# Patient Record
Sex: Female | Born: 1940 | Race: Black or African American | Hispanic: No | Smoking: Never smoker
Health system: Southern US, Community
[De-identification: ages and names within clinical notes are randomized; demographics above are authoritative.]

## PROBLEM LIST (undated history)

## (undated) DIAGNOSIS — G5600 Carpal tunnel syndrome, unspecified upper limb: Secondary | ICD-10-CM

## (undated) DIAGNOSIS — M2540 Effusion, unspecified joint: Secondary | ICD-10-CM

## (undated) DIAGNOSIS — R0602 Shortness of breath: Secondary | ICD-10-CM

## (undated) DIAGNOSIS — Z8489 Family history of other specified conditions: Secondary | ICD-10-CM

## (undated) DIAGNOSIS — H269 Unspecified cataract: Secondary | ICD-10-CM

## (undated) DIAGNOSIS — Z9889 Other specified postprocedural states: Secondary | ICD-10-CM

## (undated) DIAGNOSIS — Z9289 Personal history of other medical treatment: Secondary | ICD-10-CM

## (undated) DIAGNOSIS — I10 Essential (primary) hypertension: Secondary | ICD-10-CM

## (undated) DIAGNOSIS — R350 Frequency of micturition: Secondary | ICD-10-CM

## (undated) DIAGNOSIS — Q2112 Patent foramen ovale: Secondary | ICD-10-CM

## (undated) DIAGNOSIS — R112 Nausea with vomiting, unspecified: Secondary | ICD-10-CM

## (undated) DIAGNOSIS — M62838 Other muscle spasm: Secondary | ICD-10-CM

## (undated) DIAGNOSIS — N189 Chronic kidney disease, unspecified: Secondary | ICD-10-CM

## (undated) DIAGNOSIS — D649 Anemia, unspecified: Secondary | ICD-10-CM

## (undated) DIAGNOSIS — M1990 Unspecified osteoarthritis, unspecified site: Secondary | ICD-10-CM

## (undated) DIAGNOSIS — G4733 Obstructive sleep apnea (adult) (pediatric): Secondary | ICD-10-CM

## (undated) DIAGNOSIS — Z9989 Dependence on other enabling machines and devices: Secondary | ICD-10-CM

## (undated) DIAGNOSIS — I739 Peripheral vascular disease, unspecified: Secondary | ICD-10-CM

## (undated) DIAGNOSIS — J42 Unspecified chronic bronchitis: Secondary | ICD-10-CM

## (undated) DIAGNOSIS — M2550 Pain in unspecified joint: Secondary | ICD-10-CM

## (undated) DIAGNOSIS — Q211 Atrial septal defect: Secondary | ICD-10-CM

## (undated) DIAGNOSIS — R351 Nocturia: Secondary | ICD-10-CM

## (undated) DIAGNOSIS — J189 Pneumonia, unspecified organism: Secondary | ICD-10-CM

## (undated) HISTORY — PX: CHEST TUBE INSERTION: SHX231

## (undated) HISTORY — PX: BACK SURGERY: SHX140

## (undated) HISTORY — PX: KNEE ARTHROSCOPY: SUR90

## (undated) HISTORY — PX: APPENDECTOMY: SHX54

## (undated) HISTORY — PX: COLONOSCOPY: SHX174

## (undated) HISTORY — PX: ABDOMINAL HYSTERECTOMY: SHX81

## (undated) HISTORY — PX: FRACTURE SURGERY: SHX138

---

## 1968-09-24 DIAGNOSIS — D649 Anemia, unspecified: Secondary | ICD-10-CM

## 1968-09-24 HISTORY — DX: Anemia, unspecified: D64.9

## 1988-09-24 HISTORY — PX: BREAST BIOPSY: SHX20

## 1988-09-24 HISTORY — PX: HARDWARE REMOVAL: SHX979

## 1988-09-24 HISTORY — PX: TIBIA FRACTURE SURGERY: SHX806

## 2002-02-24 HISTORY — PX: HEMILAMINOTOMY LUMBAR SPINE: SUR654

## 2002-03-13 ENCOUNTER — Encounter: Payer: Self-pay | Admitting: Neurosurgery

## 2002-03-15 ENCOUNTER — Encounter: Payer: Self-pay | Admitting: Neurosurgery

## 2002-03-15 ENCOUNTER — Inpatient Hospital Stay (HOSPITAL_COMMUNITY): Admission: RE | Admit: 2002-03-15 | Discharge: 2002-03-16 | Payer: Self-pay | Admitting: Neurosurgery

## 2003-04-30 ENCOUNTER — Ambulatory Visit (HOSPITAL_COMMUNITY): Admission: RE | Admit: 2003-04-30 | Discharge: 2003-04-30 | Payer: Self-pay | Admitting: Neurosurgery

## 2003-05-25 HISTORY — PX: POSTERIOR LUMBAR FUSION: SHX6036

## 2003-05-30 ENCOUNTER — Inpatient Hospital Stay (HOSPITAL_COMMUNITY): Admission: RE | Admit: 2003-05-30 | Discharge: 2003-06-04 | Payer: Self-pay | Admitting: Neurosurgery

## 2003-12-23 ENCOUNTER — Encounter: Admission: RE | Admit: 2003-12-23 | Discharge: 2003-12-23 | Payer: Self-pay | Admitting: Neurosurgery

## 2007-09-19 ENCOUNTER — Ambulatory Visit: Payer: Self-pay | Admitting: Cardiology

## 2007-09-26 ENCOUNTER — Ambulatory Visit: Payer: Self-pay | Admitting: Cardiology

## 2008-07-14 ENCOUNTER — Ambulatory Visit: Payer: Self-pay | Admitting: Cardiology

## 2009-03-20 ENCOUNTER — Ambulatory Visit: Payer: Self-pay | Admitting: Cardiology

## 2009-05-11 ENCOUNTER — Ambulatory Visit: Payer: Self-pay | Admitting: Cardiology

## 2010-02-24 HISTORY — PX: LUMBAR LAMINECTOMY/DECOMPRESSION MICRODISCECTOMY: SHX5026

## 2010-03-16 ENCOUNTER — Inpatient Hospital Stay (HOSPITAL_COMMUNITY): Payer: Medicare Other

## 2010-03-16 ENCOUNTER — Inpatient Hospital Stay (HOSPITAL_COMMUNITY)
Admission: RE | Admit: 2010-03-16 | Discharge: 2010-03-18 | DRG: 491 | Disposition: A | Discharge status: Home / Self Care | Payer: Medicare Other | Source: Ambulatory Visit | Attending: Neurosurgery | Admitting: Neurosurgery

## 2010-03-16 DIAGNOSIS — K219 Gastro-esophageal reflux disease without esophagitis: Secondary | ICD-10-CM | POA: Diagnosis present

## 2010-03-16 DIAGNOSIS — I10 Essential (primary) hypertension: Secondary | ICD-10-CM | POA: Diagnosis present

## 2010-03-16 DIAGNOSIS — G4733 Obstructive sleep apnea (adult) (pediatric): Secondary | ICD-10-CM | POA: Diagnosis present

## 2010-03-16 DIAGNOSIS — Z981 Arthrodesis status: Secondary | ICD-10-CM

## 2010-03-16 DIAGNOSIS — M5126 Other intervertebral disc displacement, lumbar region: Principal | ICD-10-CM | POA: Diagnosis present

## 2010-03-16 DIAGNOSIS — E669 Obesity, unspecified: Secondary | ICD-10-CM | POA: Diagnosis present

## 2010-03-16 LAB — SURGICAL PCR SCREEN
MRSA, PCR: NEGATIVE
Staphylococcus aureus: NEGATIVE

## 2010-03-16 LAB — BASIC METABOLIC PANEL
BUN: 21 mg/dL (ref 6–23)
CO2: 31 mEq/L (ref 19–32)
Calcium: 9.5 mg/dL (ref 8.4–10.5)
Chloride: 106 mEq/L (ref 96–112)
Glucose, Bld: 97 mg/dL (ref 70–99)

## 2010-03-16 LAB — CBC
Hemoglobin: 11.6 g/dL — ABNORMAL LOW (ref 12.0–15.0)
MCH: 30.9 pg (ref 26.0–34.0)
MCV: 90.1 fL (ref 78.0–100.0)
Platelets: 244 10*3/uL (ref 150–400)
RBC: 3.75 MIL/uL — ABNORMAL LOW (ref 3.87–5.11)
WBC: 11.7 10*3/uL — ABNORMAL HIGH (ref 4.0–10.5)

## 2010-04-06 NOTE — Op Note (Signed)
  NAMEEBONYE, READE NO.:  [id number]  MEDICAL RECORD NO.:  [id number]           PATIENT TYPE:  I  LOCATION:  3011                         FACILITY:  [facility]  PHYSICIAN:  Hilda Lias, M.D.   DATE OF BIRTH:  1940/12/22  DATE OF PROCEDURE:  03/16/2010 DATE OF DISCHARGE:                              OPERATIVE REPORT   PREOPERATIVE DIAGNOSIS:  Left L2-L3 herniated disk with a fragment status post 4-5 and 5-1 fusion.  POSTOPERATIVE DIAGNOSES:  Left L2-L3 herniated disk with a fragment status post 4-5 and 5-1 fusion.  PROCEDURE:  Left L2-L3 laminotomy, lysis of adhesion, decompression of the thecal sac, removal of calcified herniated disk at the level of L2-3 left.  Microscope.  SURGICAL PROCEDURES:  Hilda Lias, MD  ASSISTANT:  Coletta Memos, MD  CLINICAL HISTORY:  Ms. Tilmon is a 70 year old female who in the past had fusion of 4-5 and 5-1.  She came to my office yesterday complaining of back pain down to the left leg which had been going on for 5-6 weeks. She is nonweightbearing.  MRI showed a large herniated disk with a fragment at the level of 2-3.  Surgery was advised.  The risks were explained to her.  PROCEDURE:  The patient was taken to the OR, and after intubation she was positioned in prone manner.  The back was cleaned with DuraPrep. Drapes were applied.  X-rays showed that we were right at the level of L2-L3.  Incision was made.  Immediately underneath the subcutaneous tissue, we found quite a bit of scar tissue.  Dissection was carried down until we found the spinous process.  The canal was quite vertical. We took a series of x-rays just to be sure that we were at the right level.  Then with the microscope and with the help of the drill, we removed part of the lamina of L2-L3 and part of the facet of 2-3.  The patient has a lot of adhesion.  Part of the disk was calcified up to the point to enter into the fragment we had to drill the  fragment.  At the end after we accomplished lysis, we went into the disk space.  We entered the disk space and diskectomy medial and lateral was achieved. We came back again to the foramen, and the L3 nerve root was decompressed.  The L2 was freed.  Again more lysis of adhesion was achieved.  Valsalva maneuver was negative.  From then on, the wound was closed with Vicryl and Steri-Strips.          ______________________________ Hilda Lias, M.D.     EB/MEDQ  D:  03/16/2010  T:  03/17/2010  Job:  [id number]  Electronically Signed by Hilda Lias M.D. on 04/06/2010 05:46:11 PM

## 2010-06-08 NOTE — Assessment & Plan Note (Signed)
[facility]                          [facility] OFFICE NOTE   Diane Daugherty, Diane Daugherty                        MRN:          [id number]  DATE:09/19/2007                            DOB:          [birth year]    REFERRING PHYSICIAN:  Wyvonnia Lora   REASON FOR CONSULTATION:  Chest pain.   HISTORY OF PRESENT ILLNESS:  Diane Daugherty is a very pleasant 70 year old  woman with a history of hypertension.  She describes a history of  intermittent chest pain beginning approximately 3 months ago.  She saw  Dr. Margo Common and had a chest x-ray done back in late June which was  essentially normal.  She describes 2 different types of chest pain, one  being a dull ache mainly in the left upper chest, somewhat in the left  upper back, sporadic, sometimes lasting up to several hours, and not  purely exertional.  She also had more of a tenderness in her left  lateral chest wall.  Both of these have now completely resolved.  At  that time, she was experiencing these symptoms, she denies having any  cough, hemoptysis, fevers, or chills.  She was very concerned about her  heart and decided to start doing a regular walking regimen.  She is now  up to 2 miles at a time, although she is limited by previous history of  back problems and surgery as well as a bad left knee.  She has NYHA  class II dyspnea on exertion.  Her screening electrocardiogram today  shows sinus rhythm with poor R-wave progression in the anterior  precordium, specifically V1-V3.  There is no old tracing for comparison.  She does report having a stress test (treadmill) several years ago prior  back surgery and calls this being normal.  She has not had any risk  stratification since that time.   ALLERGIES:  No known drug allergies.   MEDICATIONS:  1. Benazepril and hydrochlorothiazide 20/25 mg p.o. daily.  2. Sulindac 200 mg p.o. b.i.d.  3. Premarin 0.3 mg p.o. daily.  4. Cyclobenzaprine 10 mg p.o. p.r.n.  5.  Calcium with vitamin D 600 mg p.o. b.i.d.  6. Multivitamin 1 p.o. daily.  7. Omega 3 supplements 1000 mg p.o. b.i.d.   PAST MEDICAL HISTORY:  As outlined above.  She has a history of prior  hysterectomy, 3 cesarean sections, prior fractured right leg with  subsequent screw fixation, removal of a lipoma, back surgeries by Dr.  Jeral Fruit back in 2004 and 2005.  No reported problems of diabetes mellitus  or hyperlipidemia.   SOCIAL HISTORY:  The patient is divorced.  She has 3 children.  She is  disabled following her back surgery.  She denies any tobacco use.  No  alcohol use.   FAMILY HISTORY:  Reviewed and is noncontributory for premature  cardiovascular disease.  She states that her father died in his 83s with  cardiovascular disease and hypertension.  Her mother died at age 50 in  an accident.  No siblings with cardiovascular disease.   REVIEW OF SYSTEMS:  As detailed above.  She does have seasonal  allergies.  Problems with arthritic pain.  No palpitations, orthopnea,  PND, or syncope.   PHYSICAL EXAMINATION:  VITAL SIGNS:  Weight is 224 pounds, heart rate  82, and blood pressure 154/80.  The patient is overweight in no acute  distress.  HEENT:  Conjunctiva is normal.  Pharynx is clear.  NECK:  Supple.  No elevated jugular venous pressure.  No audible bruits.  No thyromegaly is noted.  LUNGS:  Clear to auscultation throughout.  CARDIAC EXAM:  Reveals a regular rate and rhythm, soft S3.  No  pericardial rub or S3 gallop.  ABDOMEN:  Obese, unable to palpate liver edge.  No bruits.  Bowel sounds  present.  EXTREMITIES:  Exhibit mild pitting edema bilaterally, somewhat worse on  the left.  Distal pulses are 1+.  SKIN:  Warm and dry.  MUSCULOSKELETAL:  No kyphosis is noted.  NEUROPSYCHIATRIC:  The patient is alert and oriented x3.  Affect is  appropriate.   IMPRESSION AND RECOMMENDATIONS:  Transient episodes of chest pain as  outlined above in a 70 year old overweight woman with  hypertension.  Her  resting electrocardiogram does show poor anterior R-wave progression in  leads V1 to V3.  She describes chest pain features that are fairly  atypical overall.  She remains concerned about the status of her heart.  I reviewed the matter with her and discussed either simply observing her  for any recurrent symptoms versus proceeding to an exercise  echocardiogram for additional risk stratification.  She would be more  reassured with testing and therefore we will arrange this in the near  future.  If her studies are low-risk, I would not anticipate any further  cardiac evaluation unless she continues to manifest progressive  symptomatology.  Otherwise, we will plan to see her back and review the  options.     Jonelle Sidle, MD  Electronically Signed    SGM/MedQ  DD: 09/19/2007  DT: 09/20/2007  Job #: [id number]   cc:   Wyvonnia Lora

## 2010-06-11 NOTE — H&P (Signed)
NAME:  Diane Daugherty, Diane Daugherty                           ACCOUNT NO.:  [id number]   MEDICAL RECORD NO.:  [id number]                   PATIENT TYPE:  INP   LOCATION:  3172                                 FACILITY:  [facility]   PHYSICIAN:  Hilda Lias, M.D.                DATE OF BIRTH:  1940/02/13   DATE OF ADMISSION:  05/30/2003  DATE OF DISCHARGE:                                HISTORY & PHYSICAL   Ms. Braud is a lady who has been seen in my office on several occasions  because of back pain.  This lady, back in 2004, underwent removal of a  synovial cyst.  At that time, we found she had spondylolisthesis at the L4-  L5.  The patient has had a history of many years of back pain and did say it  was better after surgery.  Nevertheless, she came back complaining of pain  in the opposite leg associated with lower back pain.  She had conservative  treatment, and she is not any better.  She had been quite miserable.  We  scheduled her for surgery before, but there was a problem with her cell  count.  We repeated the x-ray and it showed that she has a case of severe  stenosis at the level of L4-L5, associated with a spondylolisthesis and  degenerative changes at the level of L5-S1 .  Because of that, she wants to  proceed with surgery.   PAST MEDICAL HISTORY:  1. Removal of a synovial cyst.  2. She has had a hysterectomy.  3. She has a history of fractured right leg.  4. Pneumothorax .   She is not allergic to any medications.   SOCIAL HISTORY:  Negative.   REVIEW OF SYSTEMS:  Positive for high blood pressure, back pain, leg pain.   PHYSICAL EXAMINATION:  HEENT:  Normal.  NECK:  She is able to flex, extend, lateral with no problem.  LUNGS:  Clear.  HEART:  Sounds normal.  ABDOMEN:  Normal.  EXTREMITIES:  Normal pulses.  NEURO:  Mental status and cranial nerves normal.  Strength:  She has  weakness on dorsiflexion of both feet, especially the left one.  She had  difficulty walking on  tip-toes.  She has decrease of flexibility in the  lumbar spine.   The lumbar spine x-ray shows a spondylolisthesis at the level L4-L5.  The  MRI showed that she has a spondylolisthesis of the level L4-L5 with stenosis  and changed at the L5-S1.   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   RECOMMENDATIONS:  The patient is being admitted for surgery.  The procedure  will be a L4-L5 diskectomy, interbody fusion, posterior autograft fusion.  __________  of L5-S1 with __________  .  The risks were explained to the  patient including the possibility of no improvement whatsoever, need for  further surgery, CSF leak, infection, failure of the hardware, damage to the  vessels of the abdomen.                                                Hilda Lias, M.D.    EB/MEDQ  D:  05/30/2003  T:  05/30/2003  Job:  [id number]

## 2010-06-11 NOTE — Discharge Summary (Signed)
NAME:  Diane Daugherty, Diane Daugherty                           ACCOUNT NO.:  [id number]   MEDICAL RECORD NO.:  [id number]                   PATIENT TYPE:  INP   LOCATION:  3029                                 FACILITY:  [facility]   PHYSICIAN:  Hilda Lias, M.D.                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   MEDICAL HISTORY:  The patient was admitted because of back pain radiating to  both legs.  X-ray showed degenerative disk disease at L4-5, L5-S1 with  spondylolisthesis at L4-5.  The patient was taken to surgery.  Laboratory at  the present time normal, but she has a problem hemoglobin right after  surgery.   HOSPITAL COURSE:  The patient was taken and L4-5, L5-S1 diskectomy  __________ was done.  Today the patient is ambulating, having minimal  discomfort so she is ready to go home.   CONDITION ON DISCHARGE:  Improved.   DISCHARGE MEDICATIONS:  Percocet and Flexeril.   DIET:  Regular.   ACTIVITY:  She is not to drive until I see her.   FOLLOW UP:  With me in four weeks.                                                Hilda Lias, M.D.    EB/MEDQ  D:  06/04/2003  T:  06/04/2003  Job:  [id number]

## 2010-06-11 NOTE — H&P (Signed)
NAME:  Diane Daugherty, Diane Daugherty NO.:  [id number]   MEDICAL RECORD NO.:  [id number]                   PATIENT TYPE:  INP   LOCATION:  NA                                   FACILITY:  [facility]   PHYSICIAN:  Hilda Lias, M.D.                DATE OF BIRTH:  [birth year]   DATE OF ADMISSION:  DATE OF DISCHARGE:                                HISTORY & PHYSICAL   HISTORY OF PRESENT ILLNESS:  The patient is a lady who was seen by me in my  office a few days ago because of back pain for many years.  Since December  2003, the pain is different.  It is localized to the right leg, all the way  down to the right foot up to the point that she is quite miserable, and when  she came to see me she was limping from the right leg.  She felt that it is  getting worse.  She denies any problem with the left leg.  The patient had  been seen by a neurologist and because of findings she was sent to [place] for  further evaluation.   PAST MEDICAL HISTORY:  Hysterectomy.  She had a pneumothorax.  She has a  fractured right leg.   ALLERGIES:  She is not allergic to any medication.   SOCIAL HISTORY:  Negative.   REVIEW OF SYSTEMS:  Positive for high blood pressure, arthritis, back pain,  and leg pain.  The patient initially presented to my office limping on the  right leg.   PHYSICAL EXAMINATION:  VITAL SIGNS:  Weight 205, height 5 feet 1 inch.  Vitals were normal.  HEENT:  Normal.  NECK:  Normal.  LUNGS:  Clear.  HEART:  Heart sounds normal.  ABDOMEN:  Normal.  EXTREMITIES:  Normal pulses.  NEUROLOGIC:  No deficits - normal.  Cranial nerves normal. Strength 5/5  except in the right foot where she has 2/5 weakness on dorsi- and plantar  flexion.  Left leg is normal.  Sensory:  She complains of numbness in the  right side which involves the L5 and S1 nerve roots.  Straight leg raising  is highly positive on the right side at 45 degrees.   LABORATORY DATA:  The MRI showed that she has  a large synovial cyst at the  level 5-1 which is displacing the S1 nerve root.  There is some degenerative  disk disease with stenosis at the level 4-5 and some spondylolisthesis.   IMPRESSION AN PLAN:  L5-S1 synovial cyst with S1 radiculopathy.  Lumbar  stenosis with degenerative spondylolisthesis just at the level of L4-5.  In  addition, we did a lumbar x-ray with flexion and extension which showed that  the degenerative spondylolisthesis at this level.  Because of that the  patient wants to go ahead with surgery, and the procedure will be a right L5  hemilaminectomy with removal of the synovial cyst and foraminotomy to  decompress the L5 and S1 nerve root.  The risks were fully explained which  included infection, CSF leak, worsening of pain, paralysis, need for further  surgery which might require fusion.                                               Hilda Lias, M.D.   EB/MEDQ  D:  03/15/2002  T:  03/15/2002  Job:  [id number]

## 2010-06-11 NOTE — Op Note (Signed)
NAME:  Diane Daugherty, Diane Daugherty                           ACCOUNT NO.:  [id number]   MEDICAL RECORD NO.:  [id number]                   PATIENT TYPE:  INP   LOCATION:  NA                                   FACILITY:  [facility]   PHYSICIAN:  Hilda Lias, M.D.                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  Stenosis 4-5 with stable  spondylolisthesis L4-5.   POSTOPERATIVE DIAGNOSES:  Right L5-S1 synovial cyst with compromise of the  L5 and S1 nerve root and almost complete foot drop.  Stenosis 4-5 with  stable spondylolisthesis L4-5.   OPERATION PERFORMED:  Right L5 hemilaminectomy, removal of synovial cyst,  decompression of the L5 and S1 nerve root.  Microscope.   SURGEON:  Hilda Lias, M.D.   ANESTHESIA:  General.   INDICATIONS FOR PROCEDURE:  The patient was admitted because of back and  right leg pain which has gotten worse since December.  The patient had  previous MRI which showed some degenerative disk disease.  The new MRI  showed synovial cyst at the level of 4-5 with compromise of the L4-S1 nerve  root.  She has stable spondylolisthesis at 4-5.  Surgery was advised.   DESCRIPTION OF PROCEDURE:  The patient was taken to the operating room and  she was positioned in a prone manner.  This lady was severe obese and almost  over 280 pounds.  X-ray was taken prior to proceeding with the surgery and  indeed, the needle was at the level of 5-1.  Incision through the skin was  made down to the subcutaneous tissue, fascia until we found the area of 5-1.  Muscle was retracted on the right side.  Because of the depth, we brought  the microscope immediately.  We identified the L5-S1 space.  With a drill,  we drilled the one third medial facet as well as we did  hemilaminectomy at  L5.  We started working first the level of S-1.  At S1 and L5 there was a  large cyst pushing at the thecal sac as well as the S1 and the L5 nerve  roots outward because of compromise at the level of the axilla.  Dissection  was carried out and then we were able to remove large amounts of  degenerative cyst with the yellow ligament.  We did foraminotomy to  decompress the L5 and S1 nerve root.  At the end, we had good decompression.  The L5-S1 disk was bulging and there was no need to do a diskectomy.  Then  Valsalva maneuver was negative.  The area was irrigated, fat was left in the  dural space.  The wound was  closed with Vicryl and Steri-Strips.                                               Hilda Lias, M.D.    EB/MEDQ  D:  03/15/2002  T:  03/15/2002  Job:  [id number]

## 2010-06-11 NOTE — Op Note (Signed)
NAME:  Diane Daugherty, Diane Daugherty                           ACCOUNT NO.:  [id number]   MEDICAL RECORD NO.:  [id number]                   PATIENT TYPE:  INP   LOCATION:  3029                                 FACILITY:  [facility]   PHYSICIAN:  Hilda Lias, M.D.                DATE OF BIRTH:  02-22-1940   DATE OF PROCEDURE:  05/30/2003  DATE OF DISCHARGE:                                 OPERATIVE REPORT   PREOPERATIVE DIAGNOSIS:  L4-5 stenosis with spondylolisthesis, degenerative  disk disease L5-S1.   POSTOPERATIVE DIAGNOSIS:  L4-5 stenosis with spondylolisthesis, degenerative  disk disease L5-S1.   PROCEDURE:  L3, L4, L5 laminectomy; L4-5, L5-S1 diskectomy; interbody fusion  with allograft and autograft; decompression of the thecal sac; foraminotomy  to decompress L4, L5, S1 nerve roots; posterolateral arthrodesis, L4 to S1,  with autograft and allograft; pedicle screws L4, L5, S1, with crosslink;  bone marrow aspiration.   SURGEON:  Hilda Lias, M.D.   ASSISTANT:  Payton Doughty, M.D.   CLINICAL HISTORY:  The patient was admitted because of back pain with  radiation down to both legs.  In the past she underwent surgery because of  synovial cyst in the left L5-S1.  X-ray shows that she has unstable  spondylolisthesis with a stenosis at the level of L4-5 and with degenerative  disk disease at the level of L5-S1, and she had failure of conservative  treatment.  Surgery was advised and the risks were explained in the history  and physical.   PROCEDURE:  The patient was positioned in a prone manner.  The back was  prepped with Betadine.  A midline incision resecting the previous one was  made.  Incision was carried down through a thick layer of adipose tissue all  the way down until we visualized the spine.  Retraction was made.  We had to  take at least two x-rays just to be sure of the area.  From then on we  removed the spinous process of L3, L4, L5, and bilateral laminectomy was  done.  We  entered the disk space at the level of L4-5 first at the right  side and later on in the left side, and total gross diskectomy was done.  The end plates were removed using a curette.  The same procedure was done at  the level of L5-S1.  In the right side we find quite a bit of adhesion  compromising both L5-S1 nerve roots in the right side.  Neurolysis was  accomplished with decompression of both the nerve roots.  The same procedure  was done in the left side.  Having done this, we entered the disk space and  again the end plates were removed using the curette.  All of this was done  with the help of the C-arm.  Then we introduced at the level of L4-5 an  allograft of 10 x 24 and  at the level of L5-S1 8 x 24.  Medially and  laterally a mixture of autograft and allograft was used.  Then we went  laterally and we removed the periosteum of L4-5 and the ala of the sacrum.  Then a mix of autograft and allograft was used.  Prior to that, when we  introduced the pedicle screw we used a bone marrow needle to aspirate bone  marrow from L4 and L5.  This bone marrow was used to glue together the  autograft and allograft.  With the C-arm we identified the pedicles of L4,  L5, and S1.  Pedicle screw followed by a tap and a screw was accomplished  first in the right side and then in the left side.  The screw size was 6.5 x  40.  AP and lateral had good position of the bone graft.  The pedicle  screws were also in good position in anterior-posterior and lateral x-ray.  Then rod from L4 to S1 was made bilateral.  A crosslink was used right to  left to give more stability to the area.  Having done this, the area was  irrigated and the wound was closed with Vicryl and a Steri-Strip.                                               Hilda Lias, M.D.    EB/MEDQ  D:  05/30/2003  T:  05/31/2003  Job:  [id number]

## 2011-07-18 ENCOUNTER — Ambulatory Visit: Payer: Medicare Other | Attending: Orthopedic Surgery | Admitting: Physical Therapy

## 2011-07-18 DIAGNOSIS — M25569 Pain in unspecified knee: Secondary | ICD-10-CM | POA: Insufficient documentation

## 2011-07-18 DIAGNOSIS — M25669 Stiffness of unspecified knee, not elsewhere classified: Secondary | ICD-10-CM | POA: Insufficient documentation

## 2011-07-18 DIAGNOSIS — R5381 Other malaise: Secondary | ICD-10-CM | POA: Insufficient documentation

## 2011-07-18 DIAGNOSIS — IMO0001 Reserved for inherently not codable concepts without codable children: Secondary | ICD-10-CM | POA: Insufficient documentation

## 2011-07-20 ENCOUNTER — Ambulatory Visit: Payer: Medicare Other | Admitting: Physical Therapy

## 2011-07-25 ENCOUNTER — Ambulatory Visit: Payer: Medicare Other | Attending: Orthopedic Surgery | Admitting: Physical Therapy

## 2011-07-25 DIAGNOSIS — M25669 Stiffness of unspecified knee, not elsewhere classified: Secondary | ICD-10-CM | POA: Insufficient documentation

## 2011-07-25 DIAGNOSIS — R5381 Other malaise: Secondary | ICD-10-CM | POA: Insufficient documentation

## 2011-07-25 DIAGNOSIS — IMO0001 Reserved for inherently not codable concepts without codable children: Secondary | ICD-10-CM | POA: Insufficient documentation

## 2011-07-25 DIAGNOSIS — M25569 Pain in unspecified knee: Secondary | ICD-10-CM | POA: Insufficient documentation

## 2011-07-27 ENCOUNTER — Ambulatory Visit: Payer: Medicare Other | Admitting: Physical Therapy

## 2011-08-01 ENCOUNTER — Ambulatory Visit: Payer: Medicare Other | Admitting: Physical Therapy

## 2011-08-03 ENCOUNTER — Ambulatory Visit: Payer: Medicare Other | Admitting: Physical Therapy

## 2011-08-08 ENCOUNTER — Ambulatory Visit: Payer: Medicare Other | Admitting: Physical Therapy

## 2011-08-10 ENCOUNTER — Ambulatory Visit: Payer: Medicare Other | Admitting: Physical Therapy

## 2011-08-15 ENCOUNTER — Ambulatory Visit: Payer: Medicare Other | Admitting: Physical Therapy

## 2011-08-17 ENCOUNTER — Ambulatory Visit: Payer: Medicare Other | Admitting: Physical Therapy

## 2011-08-22 ENCOUNTER — Ambulatory Visit: Payer: Medicare Other | Admitting: Physical Therapy

## 2011-08-24 ENCOUNTER — Ambulatory Visit: Payer: Medicare Other | Admitting: Physical Therapy

## 2012-08-15 ENCOUNTER — Encounter (HOSPITAL_COMMUNITY): Payer: Self-pay | Admitting: Pharmacist

## 2012-08-22 ENCOUNTER — Encounter (HOSPITAL_COMMUNITY)
Admission: RE | Admit: 2012-08-22 | Discharge: 2012-08-22 | Disposition: A | Discharge status: Home / Self Care | Payer: Medicare Other | Source: Ambulatory Visit | Attending: Orthopaedic Surgery | Admitting: Orthopaedic Surgery

## 2012-08-22 ENCOUNTER — Encounter (HOSPITAL_COMMUNITY): Payer: Self-pay

## 2012-08-22 ENCOUNTER — Ambulatory Visit (HOSPITAL_COMMUNITY)
Admission: RE | Admit: 2012-08-22 | Discharge: 2012-08-22 | Disposition: A | Discharge status: Home / Self Care | Payer: Medicare Other | Source: Ambulatory Visit | Attending: Orthopedic Surgery | Admitting: Orthopedic Surgery

## 2012-08-22 DIAGNOSIS — G4733 Obstructive sleep apnea (adult) (pediatric): Secondary | ICD-10-CM | POA: Insufficient documentation

## 2012-08-22 DIAGNOSIS — M129 Arthropathy, unspecified: Secondary | ICD-10-CM | POA: Insufficient documentation

## 2012-08-22 DIAGNOSIS — R0609 Other forms of dyspnea: Secondary | ICD-10-CM | POA: Insufficient documentation

## 2012-08-22 DIAGNOSIS — Z01810 Encounter for preprocedural cardiovascular examination: Secondary | ICD-10-CM | POA: Insufficient documentation

## 2012-08-22 DIAGNOSIS — I739 Peripheral vascular disease, unspecified: Secondary | ICD-10-CM | POA: Insufficient documentation

## 2012-08-22 DIAGNOSIS — I10 Essential (primary) hypertension: Secondary | ICD-10-CM | POA: Insufficient documentation

## 2012-08-22 DIAGNOSIS — R0989 Other specified symptoms and signs involving the circulatory and respiratory systems: Secondary | ICD-10-CM | POA: Insufficient documentation

## 2012-08-22 DIAGNOSIS — Z01812 Encounter for preprocedural laboratory examination: Secondary | ICD-10-CM | POA: Insufficient documentation

## 2012-08-22 DIAGNOSIS — Z01818 Encounter for other preprocedural examination: Secondary | ICD-10-CM | POA: Insufficient documentation

## 2012-08-22 DIAGNOSIS — Q2111 Secundum atrial septal defect: Secondary | ICD-10-CM | POA: Insufficient documentation

## 2012-08-22 DIAGNOSIS — Q211 Atrial septal defect: Secondary | ICD-10-CM | POA: Insufficient documentation

## 2012-08-22 HISTORY — DX: Patent foramen ovale: Q21.12

## 2012-08-22 HISTORY — DX: Peripheral vascular disease, unspecified: I73.9

## 2012-08-22 HISTORY — DX: Essential (primary) hypertension: I10

## 2012-08-22 HISTORY — DX: Pneumonia, unspecified organism: J18.9

## 2012-08-22 HISTORY — DX: Shortness of breath: R06.02

## 2012-08-22 HISTORY — DX: Effusion, unspecified joint: M25.40

## 2012-08-22 HISTORY — DX: Family history of other specified conditions: Z84.89

## 2012-08-22 HISTORY — DX: Unspecified osteoarthritis, unspecified site: M19.90

## 2012-08-22 HISTORY — DX: Nocturia: R35.1

## 2012-08-22 HISTORY — DX: Frequency of micturition: R35.0

## 2012-08-22 HISTORY — DX: Other specified postprocedural states: R11.2

## 2012-08-22 HISTORY — DX: Unspecified cataract: H26.9

## 2012-08-22 HISTORY — DX: Carpal tunnel syndrome, unspecified upper limb: G56.00

## 2012-08-22 HISTORY — DX: Other specified postprocedural states: Z98.890

## 2012-08-22 HISTORY — DX: Pain in unspecified joint: M25.50

## 2012-08-22 HISTORY — DX: Atrial septal defect: Q21.1

## 2012-08-22 HISTORY — DX: Nausea with vomiting, unspecified: R11.2

## 2012-08-22 HISTORY — DX: Personal history of other medical treatment: Z92.89

## 2012-08-22 HISTORY — DX: Other muscle spasm: M62.838

## 2012-08-22 LAB — PROTIME-INR: INR: 1.07 (ref 0.00–1.49)

## 2012-08-22 LAB — COMPREHENSIVE METABOLIC PANEL
AST: 14 U/L (ref 0–37)
BUN: 14 mg/dL (ref 6–23)
CO2: 29 mEq/L (ref 19–32)
Chloride: 101 mEq/L (ref 96–112)
Creatinine, Ser: 1.15 mg/dL — ABNORMAL HIGH (ref 0.50–1.10)
GFR calc non Af Amer: 47 mL/min — ABNORMAL LOW (ref >90)
Total Bilirubin: 0.4 mg/dL (ref 0.3–1.2)

## 2012-08-22 LAB — CBC
HCT: 34.5 % — ABNORMAL LOW (ref 36.0–46.0)
MCV: 89.1 fL (ref 78.0–100.0)
Platelets: 222 10*3/uL (ref 150–400)
RBC: 3.87 MIL/uL (ref 3.87–5.11)
WBC: 8.3 10*3/uL (ref 4.0–10.5)

## 2012-08-22 LAB — URINE MICROSCOPIC-ADD ON

## 2012-08-22 LAB — URINALYSIS, ROUTINE W REFLEX MICROSCOPIC
Glucose, UA: NEGATIVE mg/dL
Ketones, ur: NEGATIVE mg/dL
Specific Gravity, Urine: 1.011 (ref 1.005–1.030)
pH: 7 (ref 5.0–8.0)

## 2012-08-22 MED ORDER — CHLORHEXIDINE GLUCONATE 4 % EX LIQD: 60.0000 mL | Freq: Every day | CUTANEOUS | Status: DC

## 2012-08-22 MED ORDER — CHLORHEXIDINE GLUCONATE 4 % EX LIQD: 60.0000 mL | Freq: Once | CUTANEOUS | Status: DC

## 2012-08-22 NOTE — Progress Notes (Signed)
Saw Dr.DeGent around 2011  Echo/Stress test done about 2-60yrs ago at [facility] request report   Medical Md is Dr.David Margo Common  Denies EKG or CXR in past yr  Denies ever having a heart cath

## 2012-08-22 NOTE — Pre-Procedure Instructions (Signed)
Diane Daugherty  08/22/2012   Your procedure is scheduled on:  Tues, Aug 5 @ 10:00 AM  Report to [facility] at 8:00 AM.  Call this number if you have problems the morning of surgery: [phone]   Remember:   Do not eat food or drink liquids after midnight.   Take these medicines the morning of surgery with A SIP OF WATER: Albuterol<Bring Your Inhaler With You> and Baclofen(Lioresal-if needed)              Stop taking your [place].No Goody's,BC's,Aleve,Ibuprofen,Fish Oil,or any Herbal Medications   Do not wear jewelry, make-up or nail polish.  Do not wear lotions, powders, or perfumes. You may wear deodorant.  Do not shave 48 hours prior to surgery.   Do not bring valuables to the hospital.  [facility] is not responsible                   for any belongings or valuables.  Contacts, dentures or bridgework may not be worn into surgery.  Leave suitcase in the car. After surgery it may be brought to your room.  For patients admitted to the hospital, checkout time is 11:00 AM the day of  discharge.     Special Instructions: Shower using CHG 2 nights before surgery and the night before surgery.  If you shower the day of surgery use CHG.  Use special wash - you have one bottle of CHG for all showers.  You should use approximately 1/3 of the bottle for each shower.   Please read over the following fact sheets that you were given: Pain Booklet, Coughing and Deep Breathing, Blood Transfusion Information, Total Joint Packet, MRSA Information and Surgical Site Infection Prevention

## 2012-08-23 ENCOUNTER — Encounter (HOSPITAL_COMMUNITY): Payer: Self-pay

## 2012-08-23 LAB — URINE CULTURE: Colony Count: 40000

## 2012-08-23 NOTE — Progress Notes (Signed)
Anesthesia chart review:  Patient is a 72 year old female scheduled for right TKR on 08/28/12 by Dr. Cleophas Dunker.  History includes non-smoker, morbid obesity, HTN, PFO by 2011 echo, OSA with CPAP use, PNA '11, PTX s/p CT '80's, chronic DOE, arthritis, PVD, personal and family history of post-operative N/V, L5 laminectomy/resection of synovial cyst '4, lumbar fusion '05, L2-3 laminotomy '12, hysterectomy, appendectomy. PCP is Dr. Margo Common.  EKG on 08/22/12 showed NSR, anterior infarct, age undetermined, versus lead reversal.  She has had poor anterior r wave progression on prior EKGs.   Echo on 03/20/09 showed 05/12/09 [facility]) showed normal LV systolic function, EF 60-65%, mildly dilated LA, possible PFO, no evidence of AR, trace MR, mild TR, RVSP calculated at 45 mmHg.  She later had a positive agitated saline contrast bubble study on 05/12/09 confirming a PFO with predominant right to left shunting.  (Ordered by Dr. Margo Common, but read by Dr. Andee Lineman.)  There is no documented history of CVA.    She saw cardiologist Dr. Diona Browner on 09/19/07 for evaluation of chest tightness and EKG showing poor r wave progressive.  He ordered a stress echo which was done on 09/26/07 and was negative.   CXR report on 08/22/12 showed mild cardiomegaly with vascular and prominence. No definite CHF or pneumonia. No collapse or consolidation. No effusion or pneumothorax. Trachea is midline. Atherosclerosis of the aorta. Degenerative changes of the spine and shoulders. No significant interval change.  Preoperative labs noted.  She has a known PFO without CVA history. She has tolerated multiple surgical procedures in the past.  If no acute changes then I would anticipate that she could proceed as planned.  Anesthesiologist Dr. Gypsy Balsam updated.  Velna Ochs [facility] Short Stay Center/Anesthesiology Phone [phone] 08/23/2012 11:27 AM

## 2012-08-24 NOTE — H&P (Signed)
CHIEF COMPLAINT:  Painful right knee.     HISTORY OF PRESENT ILLNESS:  Diane Daugherty is a 72 year old female who is seen today for evaluation of her right knee.  She has been seen previously by Dr. Viviann Spare Case at [facility].  Had an MRI scan in May of 2013 revealing moderate medial greater than lateral compartment degenerative changes with chondral thinning and osteophytes.  Medial meniscus was extruded from the joint and the posterior horn and body were diminutive with diffuse free edge irregularity but nothing centrally displaced meniscal fragment.  Also the ACL was mucoid degenerative.  Ligaments were intact.  He did perform an arthroscopic debridement in June of 2013.  The patient stated that it really had no benefit to her.  She has had cortisone injections and possibly even viscosupplementation with minimal benefit.  She does live alone and certainly this is causing difficulty with her independence.  She does have crepitance and giving way of the knee.  She has difficulty getting in and out of the car and certainly doing her activities of daily living.  She is presently on sulindac.  She uses Arthritis Strength Tylenol as well as water aerobics and moist heat and muscle relaxers but all these do not really give her much benefit.  She is pretty much now having pain, which is moderately severe, constant, aching and throbbing with occasional sharpness.  Nothing is really improving it and certainly activity worsens her symptoms.  Seen today for evaluation.     PAST SURGICAL HISTORY 1.  In 1962, 1963, 1970, cesarean sections.   2.  In 1984, total abdominal hysterectomy and bilateral salpingo-oophorectomy.  3.  In 1990, fracture of the right distal tibia with ORIF. 4.  In 1991, breast biopsy, which was benign.  She cannot remember which one it was. 5.  In 2004, removal of synovial cyst from her back. 6.  In 2005, a fusion of 2 levels. 7.  In 2012, a diskectomy, lumbar spine.   8.  In 2013, arthroscopic  surgery of the right knee.    MEDICATIONS 1.  Losartan/hydrochlorothiazide 100/25 one daily in the morning. 2.  Sulindac 200 mg 1 daily in the morning. 3.  Baclofen 20 mg 1/2 tablet 3 times a day as needed. 4.  Ventolin inhaler. 5.  Albuterol sulfate p.r.n.  6.  Centrum Silver multivitamin 50+ daily. 7.  Calcium citrate formula with D3 daily. 8.  Mega-red, Omega 3 krill oil daily.   ALLERGIES:  Percocet and morphine, which causes her nausea.  Levaquin does cause swelling of the face.     REVIEW OF SYSTEMS:  A 14-point review of systems is positive for glasses and partial dentures.  She does have shortness of breath but she does use an albuterol inhaler 1-2 times a month max.  She has had pneumonia and bronchitis in the past, the last time was in 2010, and since she has been with CPAP, she has had no recurrence.  She does have hypertension for the past 10-15 years.  Occasional leg cramps are noted.  She does have nocturia every 2 hours at nighttime, but then again, she takes her diuretic, she states, at nighttime.  She does have sleep apnea and uses CPAP.     FAMILY HISTORY:  Positive for a mother who died when Treina was only 51 years of age from a motor vehicle accident.  Father is deceased but she is not sure; he was not in her life.  She has no brothers and she  has a healthy sister at 20.     SOCIAL HISTORY:  A 63 year old divorced female who is retired from [employer].  She denies use of tobacco or alcohol.     PHYSICAL EXAMINATION:  A 72 year old white female, well developed, well nourished, alert, pleasant, cooperative, in moderate distress secondary to right knee pain.   Height:  61 inches.  Weight:   226 pounds.  BMI 42.7 Vital signs:  Temperature 96.7, pulse 64, respirations 16, blood pressure 130/90. Head:  Normocephalic. Eyes:  Pupils equal, round and reactive to light and accommodation with extraocular movements intact.   Ears, nose, throat:  Benign. Neck:  Supple.  No bruits were  noted. Chest:  Had good expansion. Lungs:  Were essentially clear. Cardiac:  Had a regular rhythm and rate with distant heart sounds.  No discrete murmur. Pulses:  Were trace positive in dorsalis pedis bilaterally.  Abdomen:  Was obese, soft, nontender.  No mass palpable.  Normal bowel sounds present.   Genitorectal and breast exam:  Not indicated for an orthopedic evaluation. CNS:  She is oriented x3 and cranial nerves II-XII grossly intact.   Musculoskeletal:  She has range of motion from 6-95 degrees.  Marked crepitance with range of motion.  She has periarticular spurs that are palpable.  She does have a 1+ effusion.  Pseudolaxity with varus and valgus stressing.  She is neurovascularly intact distally.  She does have 1+ pitting edema bilaterally.     RADIOGRAPHS:  X-rays from 07/18/2012 were reviewed of a weightbearing AP, which shows periarticular spurring, both medially and laterally.  She has sclerosing of both medial and lateral femoral condyles and tibial plateau.     CLINICAL IMPRESSION 1.  End-stage OA, right knee. 2.  Obesity. 3.  History of hypertension. 4.  History of sleep apnea.   RECOMMENDATIONS:  At this time, I have reviewed documents from Dr. Margo Common, which we received 07/09/2012.  He feels that she is stable from medical and cardiac standpoint for total knee replacement.  She has had an echocardiogram in 2005 with no structural disease of the heart identified.  Therefore, our plan is to consider a right total knee arthroplasty.  The procedure risks and benefits were fully explained to her in detail.  A model was used to show her.  All questions were answered.  I gave her the majority of the complications but I told her that this was not limited to these and there were certainly other ones, which included that of infections, blood clots, laceration of the artery, cutting of the ligaments as well as death and anesthetic complications among other things and she is understanding of  this.  She does live alone and would like to be in [facility] after surgery and we will expedite that postop.    Oris Drone Aleda Grana [facility] [phone]  08/24/2012 8:20 AM

## 2012-08-27 MED ORDER — CEFAZOLIN SODIUM-DEXTROSE 2-3 GM-% IV SOLR
2.0000 g | INTRAVENOUS | Status: AC
  Administered 2012-08-28: 2 g via INTRAVENOUS
  Filled 2012-08-27: qty 50

## 2012-08-27 NOTE — Progress Notes (Signed)
Pt called and informed of time change. Pt instructed to arrive at 0830 on 08/28/12 with understanding verbalized.

## 2012-08-28 ENCOUNTER — Inpatient Hospital Stay (HOSPITAL_COMMUNITY)
Admission: RE | Admit: 2012-08-28 | Discharge: 2012-08-31 | DRG: 470 | Disposition: A | Discharge status: Skilled Nursing Facility | Payer: Medicare Other | Source: Ambulatory Visit | Attending: Orthopaedic Surgery | Admitting: Orthopaedic Surgery

## 2012-08-28 ENCOUNTER — Encounter (HOSPITAL_COMMUNITY): Payer: Self-pay | Admitting: Vascular Surgery

## 2012-08-28 ENCOUNTER — Encounter (HOSPITAL_COMMUNITY): Payer: Self-pay | Admitting: Anesthesiology

## 2012-08-28 ENCOUNTER — Encounter (HOSPITAL_COMMUNITY)
Admission: RE | Disposition: A | Discharge status: Skilled Nursing Facility | Payer: Self-pay | Source: Ambulatory Visit | Attending: Orthopaedic Surgery

## 2012-08-28 ENCOUNTER — Inpatient Hospital Stay (HOSPITAL_COMMUNITY): Payer: Medicare Other | Admitting: Anesthesiology

## 2012-08-28 DIAGNOSIS — D62 Acute posthemorrhagic anemia: Secondary | ICD-10-CM | POA: Diagnosis not present

## 2012-08-28 DIAGNOSIS — G4730 Sleep apnea, unspecified: Secondary | ICD-10-CM | POA: Diagnosis present

## 2012-08-28 DIAGNOSIS — N189 Chronic kidney disease, unspecified: Secondary | ICD-10-CM | POA: Diagnosis present

## 2012-08-28 DIAGNOSIS — Q211 Atrial septal defect: Secondary | ICD-10-CM

## 2012-08-28 DIAGNOSIS — G4733 Obstructive sleep apnea (adult) (pediatric): Secondary | ICD-10-CM | POA: Diagnosis present

## 2012-08-28 DIAGNOSIS — Q2111 Secundum atrial septal defect: Secondary | ICD-10-CM

## 2012-08-28 DIAGNOSIS — Z7901 Long term (current) use of anticoagulants: Secondary | ICD-10-CM

## 2012-08-28 DIAGNOSIS — Z888 Allergy status to other drugs, medicaments and biological substances status: Secondary | ICD-10-CM

## 2012-08-28 DIAGNOSIS — M1710 Unilateral primary osteoarthritis, unspecified knee: Principal | ICD-10-CM | POA: Diagnosis present

## 2012-08-28 DIAGNOSIS — I129 Hypertensive chronic kidney disease with stage 1 through stage 4 chronic kidney disease, or unspecified chronic kidney disease: Secondary | ICD-10-CM | POA: Diagnosis present

## 2012-08-28 DIAGNOSIS — I10 Essential (primary) hypertension: Secondary | ICD-10-CM | POA: Diagnosis not present

## 2012-08-28 DIAGNOSIS — J45909 Unspecified asthma, uncomplicated: Secondary | ICD-10-CM | POA: Diagnosis not present

## 2012-08-28 DIAGNOSIS — I739 Peripheral vascular disease, unspecified: Secondary | ICD-10-CM | POA: Diagnosis present

## 2012-08-28 DIAGNOSIS — M1711 Unilateral primary osteoarthritis, right knee: Secondary | ICD-10-CM

## 2012-08-28 DIAGNOSIS — Z79899 Other long term (current) drug therapy: Secondary | ICD-10-CM

## 2012-08-28 DIAGNOSIS — Z6841 Body Mass Index (BMI) 40.0 and over, adult: Secondary | ICD-10-CM

## 2012-08-28 HISTORY — PX: TOTAL KNEE ARTHROPLASTY: SHX125

## 2012-08-28 SURGERY — ARTHROPLASTY, KNEE, TOTAL
Anesthesia: General | Site: Knee | Laterality: Right | Wound class: Clean

## 2012-08-28 MED ORDER — HYDROCHLOROTHIAZIDE 25 MG PO TABS
25.0000 mg | ORAL_TABLET | Freq: Every day | ORAL | Status: DC
  Administered 2012-08-28 – 2012-08-31 (×4): 25 mg via ORAL
  Filled 2012-08-28 (×4): qty 1

## 2012-08-28 MED ORDER — BUPIVACAINE-EPINEPHRINE PF 0.5-1:200000 % IJ SOLN
INTRAMUSCULAR | Status: DC | PRN
  Administered 2012-08-28: 30 mL

## 2012-08-28 MED ORDER — FENTANYL CITRATE 0.05 MG/ML IJ SOLN
50.0000 ug | INTRAMUSCULAR | Status: DC | PRN
  Administered 2012-08-28: 50 ug via INTRAVENOUS
  Filled 2012-08-28: qty 2

## 2012-08-28 MED ORDER — PHENOL 1.4 % MT LIQD: 1.0000 | OROMUCOSAL | Status: DC | PRN

## 2012-08-28 MED ORDER — HYDROMORPHONE HCL 2 MG PO TABS
2.0000 mg | ORAL_TABLET | ORAL | Status: DC | PRN
  Administered 2012-08-28 – 2012-08-29 (×4): 2 mg via ORAL
  Administered 2012-08-30: 4 mg via ORAL
  Administered 2012-08-30 – 2012-08-31 (×2): 2 mg via ORAL
  Filled 2012-08-28: qty 2
  Filled 2012-08-28 (×6): qty 1

## 2012-08-28 MED ORDER — LACTATED RINGERS IV SOLN
INTRAVENOUS | Status: DC | PRN
  Administered 2012-08-28 (×2): via INTRAVENOUS

## 2012-08-28 MED ORDER — HYDROMORPHONE HCL PF 1 MG/ML IJ SOLN: 0.5000 mg | INTRAMUSCULAR | Status: DC | PRN

## 2012-08-28 MED ORDER — WHITE PETROLATUM GEL
Status: AC
  Administered 2012-08-28: 22:00:00
  Filled 2012-08-28: qty 5

## 2012-08-28 MED ORDER — METOCLOPRAMIDE HCL 5 MG/ML IJ SOLN
INTRAMUSCULAR | Status: DC | PRN
  Administered 2012-08-28: 10 mg via INTRAVENOUS

## 2012-08-28 MED ORDER — CEFAZOLIN SODIUM-DEXTROSE 2-3 GM-% IV SOLR
2.0000 g | Freq: Four times a day (QID) | INTRAVENOUS | Status: AC
  Administered 2012-08-28 (×2): 2 g via INTRAVENOUS
  Filled 2012-08-28 (×2): qty 50

## 2012-08-28 MED ORDER — NEOSTIGMINE METHYLSULFATE 1 MG/ML IJ SOLN
INTRAMUSCULAR | Status: DC | PRN
  Administered 2012-08-28: 3 mg via INTRAVENOUS

## 2012-08-28 MED ORDER — ALUM & MAG HYDROXIDE-SIMETH 200-200-20 MG/5ML PO SUSP: 30.0000 mL | ORAL | Status: DC | PRN

## 2012-08-28 MED ORDER — ALBUTEROL SULFATE HFA 108 (90 BASE) MCG/ACT IN AERS: 2.0000 | INHALATION_SPRAY | Freq: Four times a day (QID) | RESPIRATORY_TRACT | Status: DC | PRN

## 2012-08-28 MED ORDER — LIDOCAINE HCL (CARDIAC) 20 MG/ML IV SOLN
INTRAVENOUS | Status: DC | PRN
  Administered 2012-08-28: 80 mg via INTRAVENOUS

## 2012-08-28 MED ORDER — MIDAZOLAM HCL 2 MG/2ML IJ SOLN
1.0000 mg | INTRAMUSCULAR | Status: DC | PRN
  Administered 2012-08-28: 2 mg via INTRAVENOUS
  Filled 2012-08-28: qty 2

## 2012-08-28 MED ORDER — LOSARTAN POTASSIUM 50 MG PO TABS
100.0000 mg | ORAL_TABLET | Freq: Every day | ORAL | Status: DC
  Administered 2012-08-28 – 2012-08-31 (×4): 100 mg via ORAL
  Filled 2012-08-28 (×4): qty 2

## 2012-08-28 MED ORDER — ONDANSETRON HCL 4 MG PO TABS: 4.0000 mg | ORAL_TABLET | Freq: Four times a day (QID) | ORAL | Status: DC | PRN

## 2012-08-28 MED ORDER — FLEET ENEMA 7-19 GM/118ML RE ENEM: 1.0000 | ENEMA | Freq: Once | RECTAL | Status: AC | PRN

## 2012-08-28 MED ORDER — METOCLOPRAMIDE HCL 5 MG/ML IJ SOLN: 5.0000 mg | Freq: Three times a day (TID) | INTRAMUSCULAR | Status: DC | PRN

## 2012-08-28 MED ORDER — ONDANSETRON HCL 4 MG/2ML IJ SOLN: 4.0000 mg | Freq: Four times a day (QID) | INTRAMUSCULAR | Status: DC | PRN

## 2012-08-28 MED ORDER — METOCLOPRAMIDE HCL 10 MG PO TABS: 5.0000 mg | ORAL_TABLET | Freq: Three times a day (TID) | ORAL | Status: DC | PRN

## 2012-08-28 MED ORDER — LACTATED RINGERS IV SOLN
INTRAVENOUS | Status: DC
  Administered 2012-08-28: 09:00:00 via INTRAVENOUS

## 2012-08-28 MED ORDER — KETOROLAC TROMETHAMINE 15 MG/ML IJ SOLN
15.0000 mg | Freq: Four times a day (QID) | INTRAMUSCULAR | Status: AC
  Administered 2012-08-28: 15 mg via INTRAVENOUS
  Filled 2012-08-28: qty 1

## 2012-08-28 MED ORDER — EPHEDRINE SULFATE 50 MG/ML IJ SOLN
INTRAMUSCULAR | Status: DC | PRN
  Administered 2012-08-28: 15 mg via INTRAVENOUS
  Administered 2012-08-28: 10 mg via INTRAVENOUS
  Administered 2012-08-28: 15 mg via INTRAVENOUS

## 2012-08-28 MED ORDER — SUCCINYLCHOLINE CHLORIDE 20 MG/ML IJ SOLN
INTRAMUSCULAR | Status: DC | PRN
  Administered 2012-08-28: 140 mg via INTRAVENOUS

## 2012-08-28 MED ORDER — BUPIVACAINE-EPINEPHRINE PF 0.25-1:200000 % IJ SOLN
INTRAMUSCULAR | Status: AC
  Filled 2012-08-28: qty 30

## 2012-08-28 MED ORDER — KETOROLAC TROMETHAMINE 30 MG/ML IJ SOLN
INTRAMUSCULAR | Status: AC
  Administered 2012-08-28: 15 mg
  Filled 2012-08-28: qty 1

## 2012-08-28 MED ORDER — ACETAMINOPHEN 325 MG PO TABS: 650.0000 mg | ORAL_TABLET | Freq: Four times a day (QID) | ORAL | Status: DC | PRN

## 2012-08-28 MED ORDER — LOSARTAN POTASSIUM-HCTZ 100-25 MG PO TABS: 1.0000 | ORAL_TABLET | Freq: Every day | ORAL | Status: DC

## 2012-08-28 MED ORDER — ONDANSETRON HCL 4 MG/2ML IJ SOLN
INTRAMUSCULAR | Status: DC | PRN
  Administered 2012-08-28 (×2): 4 mg via INTRAVENOUS

## 2012-08-28 MED ORDER — ROCURONIUM BROMIDE 100 MG/10ML IV SOLN
INTRAVENOUS | Status: DC | PRN
  Administered 2012-08-28: 40 mg via INTRAVENOUS

## 2012-08-28 MED ORDER — PROPOFOL 10 MG/ML IV BOLUS
INTRAVENOUS | Status: DC | PRN
  Administered 2012-08-28: 50 mg via INTRAVENOUS
  Administered 2012-08-28: 150 mg via INTRAVENOUS

## 2012-08-28 MED ORDER — HYDROMORPHONE HCL PF 1 MG/ML IJ SOLN
INTRAMUSCULAR | Status: AC
  Filled 2012-08-28: qty 1

## 2012-08-28 MED ORDER — BISACODYL 10 MG RE SUPP
10.0000 mg | Freq: Every day | RECTAL | Status: DC | PRN
  Administered 2012-08-30: 10 mg via RECTAL
  Filled 2012-08-28 (×2): qty 1

## 2012-08-28 MED ORDER — MAGNESIUM HYDROXIDE 400 MG/5ML PO SUSP
30.0000 mL | Freq: Every day | ORAL | Status: DC | PRN
  Administered 2012-08-29: 30 mL via ORAL
  Filled 2012-08-28: qty 30

## 2012-08-28 MED ORDER — ACETAMINOPHEN 650 MG RE SUPP: 650.0000 mg | Freq: Four times a day (QID) | RECTAL | Status: DC | PRN

## 2012-08-28 MED ORDER — GLYCOPYRROLATE 0.2 MG/ML IJ SOLN
INTRAMUSCULAR | Status: DC | PRN
  Administered 2012-08-28: 0.6 mg via INTRAVENOUS

## 2012-08-28 MED ORDER — RIVAROXABAN 10 MG PO TABS
10.0000 mg | ORAL_TABLET | ORAL | Status: DC
  Administered 2012-08-29 – 2012-08-31 (×3): 10 mg via ORAL
  Filled 2012-08-28 (×4): qty 1

## 2012-08-28 MED ORDER — DEXAMETHASONE SODIUM PHOSPHATE 10 MG/ML IJ SOLN
INTRAMUSCULAR | Status: DC | PRN
  Administered 2012-08-28: 8 mg via INTRAVENOUS

## 2012-08-28 MED ORDER — SODIUM CHLORIDE 0.9 % IR SOLN
Status: DC | PRN
  Administered 2012-08-28: 1000 mL
  Administered 2012-08-28: 3000 mL

## 2012-08-28 MED ORDER — FENTANYL CITRATE 0.05 MG/ML IJ SOLN
INTRAMUSCULAR | Status: DC | PRN
  Administered 2012-08-28 (×4): 50 ug via INTRAVENOUS

## 2012-08-28 MED ORDER — BACLOFEN 10 MG PO TABS
10.0000 mg | ORAL_TABLET | Freq: Every day | ORAL | Status: DC | PRN
  Filled 2012-08-28 (×2): qty 1

## 2012-08-28 MED ORDER — MENTHOL 3 MG MT LOZG: 1.0000 | LOZENGE | OROMUCOSAL | Status: DC | PRN

## 2012-08-28 MED ORDER — HYDROMORPHONE HCL PF 1 MG/ML IJ SOLN
0.2500 mg | INTRAMUSCULAR | Status: DC | PRN
Start: 2012-08-28 — End: 2012-08-28
  Administered 2012-08-28: 0.5 mg via INTRAVENOUS

## 2012-08-28 MED ORDER — SODIUM CHLORIDE 0.9 % IV SOLN: 75.0000 mL/h | INTRAVENOUS | Status: DC

## 2012-08-28 MED ORDER — FENTANYL CITRATE 0.05 MG/ML IJ SOLN
INTRAMUSCULAR | Status: AC
Start: 2012-08-28 — End: 2012-08-29
  Filled 2012-08-28: qty 2

## 2012-08-28 MED ORDER — DOCUSATE SODIUM 100 MG PO CAPS
100.0000 mg | ORAL_CAPSULE | Freq: Two times a day (BID) | ORAL | Status: DC
  Administered 2012-08-28 – 2012-08-31 (×6): 100 mg via ORAL
  Filled 2012-08-28 (×7): qty 1

## 2012-08-28 MED ORDER — THROMBIN 20000 UNITS EX KIT
PACK | CUTANEOUS | Status: DC | PRN
  Administered 2012-08-28: 20000 [IU] via TOPICAL

## 2012-08-28 MED ORDER — THROMBIN 20000 UNITS EX KIT
PACK | CUTANEOUS | Status: AC
  Filled 2012-08-28: qty 1

## 2012-08-28 MED ORDER — BUPIVACAINE-EPINEPHRINE 0.25% -1:200000 IJ SOLN
INTRAMUSCULAR | Status: DC | PRN
  Administered 2012-08-28: 30 mL

## 2012-08-28 MED ORDER — RIVAROXABAN 10 MG PO TABS
10.0000 mg | ORAL_TABLET | ORAL | Status: DC
  Filled 2012-08-28: qty 1

## 2012-08-28 MED ORDER — ACETAMINOPHEN 10 MG/ML IV SOLN
INTRAVENOUS | Status: AC
  Administered 2012-08-28: 1000 mg via INTRAVENOUS
  Filled 2012-08-28: qty 100

## 2012-08-28 SURGICAL SUPPLY — 61 items
BANDAGE ESMARK 6X9 LF (GAUZE/BANDAGES/DRESSINGS) ×1 IMPLANT
BLADE SAGITTAL 25.0X1.19X90 (BLADE) ×2 IMPLANT
BNDG CMPR 9X6 STRL LF SNTH (GAUZE/BANDAGES/DRESSINGS) ×1
BNDG ESMARK 6X9 LF (GAUZE/BANDAGES/DRESSINGS) ×2
BOWL SMART MIX CTS (DISPOSABLE) ×2 IMPLANT
CAPT RP KNEE ×1 IMPLANT
CEMENT HV SMART SET (Cement) ×4 IMPLANT
CLOTH BEACON ORANGE TIMEOUT ST (SAFETY) ×2 IMPLANT
COVER BACK TABLE 24X17X13 BIG (DRAPES) ×2 IMPLANT
COVER SURGICAL LIGHT HANDLE (MISCELLANEOUS) ×2 IMPLANT
CUFF TOURNIQUET SINGLE 34IN LL (TOURNIQUET CUFF) ×1 IMPLANT
CUFF TOURNIQUET SINGLE 44IN (TOURNIQUET CUFF) IMPLANT
DRAPE EXTREMITY T 121X128X90 (DRAPE) ×2 IMPLANT
DRAPE PROXIMA HALF (DRAPES) ×2 IMPLANT
DRSG ADAPTIC 3X8 NADH LF (GAUZE/BANDAGES/DRESSINGS) ×2 IMPLANT
DRSG PAD ABDOMINAL 8X10 ST (GAUZE/BANDAGES/DRESSINGS) ×3 IMPLANT
DURAPREP 26ML APPLICATOR (WOUND CARE) ×3 IMPLANT
ELECT CAUTERY BLADE 6.4 (BLADE) ×2 IMPLANT
ELECT REM PT RETURN 9FT ADLT (ELECTROSURGICAL) ×2
ELECTRODE REM PT RTRN 9FT ADLT (ELECTROSURGICAL) ×1 IMPLANT
EVACUATOR 1/8 PVC DRAIN (DRAIN) ×1 IMPLANT
FACESHIELD LNG OPTICON STERILE (SAFETY) ×4 IMPLANT
FLOSEAL 10ML (HEMOSTASIS) IMPLANT
GAUZE XEROFORM 5X9 LF (GAUZE/BANDAGES/DRESSINGS) ×1 IMPLANT
GLOVE BIO SURGEON STRL SZ7.5 (GLOVE) ×2 IMPLANT
GLOVE BIOGEL PI IND STRL 8 (GLOVE) ×1 IMPLANT
GLOVE BIOGEL PI IND STRL 8.5 (GLOVE) ×1 IMPLANT
GLOVE BIOGEL PI INDICATOR 8 (GLOVE) ×1
GLOVE BIOGEL PI INDICATOR 8.5 (GLOVE) ×1
GLOVE ECLIPSE 8.0 STRL XLNG CF (GLOVE) ×4 IMPLANT
GLOVE SURG ORTHO 8.5 STRL (GLOVE) ×2 IMPLANT
GOWN PREVENTION PLUS XXLARGE (GOWN DISPOSABLE) ×2 IMPLANT
GOWN STRL NON-REIN LRG LVL3 (GOWN DISPOSABLE) ×4 IMPLANT
HANDPIECE INTERPULSE COAX TIP (DISPOSABLE) ×2
KIT BASIN OR (CUSTOM PROCEDURE TRAY) ×2 IMPLANT
KIT ROOM TURNOVER OR (KITS) ×2 IMPLANT
MANIFOLD NEPTUNE II (INSTRUMENTS) ×2 IMPLANT
NEEDLE 22X1 1/2 (OR ONLY) (NEEDLE) ×1 IMPLANT
NS IRRIG 1000ML POUR BTL (IV SOLUTION) ×2 IMPLANT
PACK TOTAL JOINT (CUSTOM PROCEDURE TRAY) ×2 IMPLANT
PAD ARMBOARD 7.5X6 YLW CONV (MISCELLANEOUS) ×4 IMPLANT
PAD CAST 4YDX4 CTTN HI CHSV (CAST SUPPLIES) ×1 IMPLANT
PADDING CAST COTTON 4X4 STRL (CAST SUPPLIES) ×2
PADDING CAST COTTON 6X4 STRL (CAST SUPPLIES) ×2 IMPLANT
SET HNDPC FAN SPRY TIP SCT (DISPOSABLE) ×1 IMPLANT
SPONGE GAUZE 4X4 12PLY (GAUZE/BANDAGES/DRESSINGS) ×2 IMPLANT
STAPLER VISISTAT 35W (STAPLE) ×2 IMPLANT
SUCTION FRAZIER TIP 10 FR DISP (SUCTIONS) ×2 IMPLANT
SUT BONE WAX W31G (SUTURE) ×2 IMPLANT
SUT ETHIBOND NAB CT1 #1 30IN (SUTURE) ×7 IMPLANT
SUT MNCRL AB 3-0 PS2 18 (SUTURE) ×2 IMPLANT
SUT VIC AB 0 CT1 27 (SUTURE) ×2
SUT VIC AB 0 CT1 27XBRD ANBCTR (SUTURE) ×1 IMPLANT
SUT VIC AB 1 CT1 27 (SUTURE) ×2
SUT VIC AB 1 CT1 27XBRD ANBCTR (SUTURE) ×1 IMPLANT
SYR CONTROL 10ML LL (SYRINGE) IMPLANT
TOWEL OR 17X24 6PK STRL BLUE (TOWEL DISPOSABLE) ×2 IMPLANT
TOWEL OR 17X26 10 PK STRL BLUE (TOWEL DISPOSABLE) ×2 IMPLANT
TRAY FOLEY CATH 16FRSI W/METER (SET/KITS/TRAYS/PACK) ×1 IMPLANT
WATER STERILE IRR 1000ML POUR (IV SOLUTION) ×4 IMPLANT
WRAP KNEE MAXI GEL POST OP (GAUZE/BANDAGES/DRESSINGS) ×2 IMPLANT

## 2012-08-28 NOTE — Preoperative (Signed)
Beta Blockers   Reason not to administer Beta Blockers:Not Applicable 

## 2012-08-28 NOTE — Progress Notes (Signed)
Orthopedic Tech Progress Note Patient Details:  Diane Daugherty 09/27/1940 [id number] Applied CPM to RLE.  Applied OHF with trapeze to bed. CPM Right Knee CPM Right Knee: On Right Knee Flexion (Degrees): 60 Right Knee Extension (Degrees): 0   Lesle Chris 08/28/2012, 2:26 PM

## 2012-08-28 NOTE — Evaluation (Signed)
Physical Therapy Evaluation Patient Details Name: Diane Daugherty MRN: [id number] DOB: Jun 19, 1940 Today's Date: 08/28/2012 Time: 4098-1191 PT Time Calculation (min): 19 min  PT Assessment / Plan / Recommendation History of Present Illness  Pt admitted for R TKA  Clinical Impression  Pt is s/p TKA resulting in the deficits listed below (see PT Problem List). Pt motivated and progressing well for POD 0. Pt lives alone and unsafe to return home alone at this time. Pt will benefit from skilled PT to increase their independence and safety with mobility to allow discharge to the venue listed below.      PT Assessment  Patient needs continued PT services    Follow Up Recommendations  SNF    Does the patient have the potential to tolerate intense rehabilitation      Barriers to Discharge Decreased caregiver support pt lives alone    Equipment Recommendations  None recommended by PT    Recommendations for Other Services     Frequency 7X/week    Precautions / Restrictions Precautions Precautions: Knee Precaution Comments: HEP provided Restrictions Weight Bearing Restrictions: Yes RLE Weight Bearing: Partial weight bearing RLE Partial Weight Bearing Percentage or Pounds: 50   Pertinent Vitals/Pain Pt reports 5/10 R knee pain      Mobility  Bed Mobility Bed Mobility: Supine to Sit Supine to Sit: 3: Mod assist;With rails;HOB flat Details for Bed Mobility Assistance: attempted long sit however pt with h/o of lumbar fusion and prefers to log roll Transfers Transfers: Sit to Stand;Stand to Sit Sit to Stand: 4: Min assist;With upper extremity assist;From bed Stand to Sit: 4: Min assist;With upper extremity assist;To chair/3-in-1 Details for Transfer Assistance: v/c's for hand placement and R LE management Ambulation/Gait Ambulation/Gait Assistance: 4: Min assist Ambulation Distance (Feet): 5 Feet Assistive device: Rolling walker Ambulation/Gait Assistance Details: v/c's for  sequencing, walker management Gait Pattern: Step-to pattern;Decreased stride length Gait velocity: slow Stairs: No    Exercises Total Joint Exercises Ankle Circles/Pumps: AROM;Both;10 reps;Supine Quad Sets: AROM;Right;10 reps;Supine Goniometric ROM: 45 deg AA   PT Diagnosis: Difficulty walking;Acute pain  PT Problem List: Decreased strength;Decreased range of motion;Decreased activity tolerance;Decreased mobility PT Treatment Interventions: DME instruction;Gait training;Functional mobility training;Therapeutic exercise;Therapeutic activities     PT Goals(Current goals can be found in the care plan section) Acute Rehab PT Goals Patient Stated Goal: rehab to then go home PT Goal Formulation: With patient Time For Goal Achievement: 09/04/12 Potential to Achieve Goals: Good  Visit Information  Last PT Received On: 08/28/12 Assistance Needed: +1 History of Present Illness: Pt admitted for R TKA       Prior Functioning  Home Living Family/patient expects to be discharged to:: Skilled nursing facility Additional Comments: pt lived alone PTA Prior Function Level of Independence: Independent with assistive device(s) Comments: used cane Communication Communication: No difficulties Dominant Hand: Right    Cognition  Cognition Arousal/Alertness: Awake/alert Behavior During Therapy: WFL for tasks assessed/performed Overall Cognitive Status: Within Functional Limits for tasks assessed    Extremity/Trunk Assessment Upper Extremity Assessment Upper Extremity Assessment: Overall WFL for tasks assessed Lower Extremity Assessment Lower Extremity Assessment: RLE deficits/detail RLE Deficits / Details: pt able to initiate quad set Cervical / Trunk Assessment Cervical / Trunk Assessment:  (h/o lumbar fusion)   Balance    End of Session PT - End of Session Equipment Utilized During Treatment: Gait belt Activity Tolerance: Patient tolerated treatment well Patient left: in chair;with  call bell/phone within reach;with family/visitor present Nurse Communication: Mobility status CPM Right  Knee CPM Right Knee: Off Right Knee Flexion (Degrees): 60 Right Knee Extension (Degrees): 0  GP     Georgeana Oertel Marie 08/28/2012, 4:51 PM  Lewis Shock, PT, DPT Pager #: [phone] Office #: [phone]

## 2012-08-28 NOTE — Progress Notes (Signed)
RT Note: Pt placed on CPAP auto titrate with home mask. Pt tolerating well RT will continue to monitor

## 2012-08-28 NOTE — Progress Notes (Signed)
UR COMPLETED  

## 2012-08-28 NOTE — Anesthesia Procedure Notes (Addendum)
Anesthesia Regional Block:  Femoral nerve block  Pre-Anesthetic Checklist: ,, timeout performed, Correct Patient, Correct Site, Correct Laterality, Correct Procedure,, site marked, risks and benefits discussed, Surgical consent,  Pre-op evaluation,  At surgeon's request and post-op pain management  Laterality: Right  Prep: chloraprep       Needles:  Injection technique: Single-shot  Needle Type: Echogenic Stimulator Needle     Needle Length: 9cm  Needle Gauge: 21    Additional Needles:  Procedures: nerve stimulator Femoral nerve block  Nerve Stimulator or Paresthesia:  Response: Quadriceps muscle contraction, 0.45 mA,   Additional Responses:   Narrative:  Start time: 08/28/2012 10:05 AM End time: 08/28/2012 10:14 AM Injection made incrementally with aspirations every 5 mL.  Performed by: Personally  Anesthesiologist: Dr Chaney Malling  Additional Notes: Functioning IV was confirmed and monitors were applied.  A 90mm 21ga Arrow echogenic stimulator needle was used. Sterile prep and drape,hand hygiene and sterile gloves were used.  Negative aspiration and negative test dose prior to incremental administration of local anesthetic. The patient tolerated the procedure well.    Femoral nerve block Procedure Name: Intubation Date/Time: 08/28/2012 10:23 AM Performed by: Carmela Rima Pre-anesthesia Checklist: Patient identified, Timeout performed, Emergency Drugs available, Suction available and Patient being monitored Patient Re-evaluated:Patient Re-evaluated prior to inductionOxygen Delivery Method: Circle system utilized Preoxygenation: Pre-oxygenation with 100% oxygen Intubation Type: IV induction and Rapid sequence Ventilation: Mask ventilation without difficulty Laryngoscope Size: Mac and 4 Grade View: Grade II Tube type: Oral Tube size: 7.5 mm Number of attempts: 1 Placement Confirmation: ETT inserted through vocal cords under direct vision,  positive ETCO2 and breath  sounds checked- equal and bilateral Secured at: 24 cm Tube secured with: Tape Dental Injury: Teeth and Oropharynx as per pre-operative assessment

## 2012-08-28 NOTE — Progress Notes (Signed)
Patient ID: Diane Daugherty, female   DOB: 1940/03/29, 72 y.o.   MRN: [id number] PATIENT ID:      Diane Daugherty  MRN:     [id number] DOB/AGE:    07/21/1940 / 72 y.o.       OPERATIVE REPORT    DATE OF PROCEDURE:  08/28/2012       PREOPERATIVE DIAGNOSIS:   Right Knee Osteoarthritis-end stage                                                       Morbid obesity with BMI 42     POSTOPERATIVE DIAGNOSIS:   Right Knee Osteoarthritis                                                          same   PROCEDURE:  Procedure(s): TOTAL KNEE ARTHROPLASTY right     SURGEON:  [name], MD    ASSISTANT:   Jacqualine Code, PA-C   (Present and scrubbed throughout the case, critical for assistance with exposure, retraction, instrumentation, and closure.)          ANESTHESIA: regional and general     DRAINS: (right knee) Hemovact drain(s) in the clamped with  Suction Clamped :      TOURNIQUET TIME:  Total Tourniquet Time Documented: Thigh (Right) - 73 minutes Total: Thigh (Right) - 73 minutes     COMPLICATIONS:  None   CONDITION:  stable  PROCEDURE IN DETAIL: [id number]    Diane Daugherty 08/28/2012, 12:07 PM

## 2012-08-28 NOTE — H&P (Signed)
  The recent History & Physical has been reviewed. I have personally examined the patient today. There is no interval change to the documented History & Physical. The patient would like to proceed with the procedure.  [name] W 08/28/2012,  9:44 AM

## 2012-08-28 NOTE — Anesthesia Postprocedure Evaluation (Signed)
Anesthesia Post Note  Patient: Diane Daugherty  Procedure(s) Performed: Procedure(s) (LRB): TOTAL KNEE ARTHROPLASTY (Right)  Anesthesia type: General  Patient location: PACU  Post pain: Pain level controlled and Adequate analgesia  Post assessment: Post-op Vital signs reviewed, Patient's Cardiovascular Status Stable, Respiratory Function Stable, Patent Airway and Pain level controlled  Last Vitals:  Filed Vitals:   08/28/12 1400  BP: 138/59  Pulse: 86  Temp:   Resp: 14    Post vital signs: Reviewed and stable  Level of consciousness: awake, alert  and oriented  Complications: No apparent anesthesia complications

## 2012-08-28 NOTE — Anesthesia Preprocedure Evaluation (Addendum)
Anesthesia Evaluation  Patient identified by MRN, date of birth, ID band Patient awake    Reviewed: Allergy & Precautions, H&P , NPO status , Patient's Chart, lab work & pertinent test results  History of Anesthesia Complications (+) PONV  Airway Mallampati: II  Neck ROM: full    Dental  (+) Teeth Intact and Dental Advidsory Given   Pulmonary shortness of breath, sleep apnea ,          Cardiovascular hypertension, + Peripheral Vascular Disease     Neuro/Psych  Neuromuscular disease    GI/Hepatic   Endo/Other  Morbid obesity  Renal/GU      Musculoskeletal  (+) Arthritis -,   Abdominal   Peds  Hematology   Anesthesia Other Findings   Reproductive/Obstetrics                          Anesthesia Physical Anesthesia Plan  ASA: III  Anesthesia Plan: General and Regional   Post-op Pain Management: MAC Combined w/ Regional for Post-op pain   Induction: Intravenous  Airway Management Planned: Oral ETT  Additional Equipment:   Intra-op Plan:   Post-operative Plan: Extubation in OR  Informed Consent: I have reviewed the patients History and Physical, chart, labs and discussed the procedure including the risks, benefits and alternatives for the proposed anesthesia with the patient or authorized representative who has indicated his/her understanding and acceptance.   Dental Advisory Given  Plan Discussed with: CRNA, Anesthesiologist and Surgeon  Anesthesia Plan Comments:        Anesthesia Quick Evaluation

## 2012-08-28 NOTE — Transfer of Care (Signed)
Immediate Anesthesia Transfer of Care Note  Patient: Diane Daugherty  Procedure(s) Performed: Procedure(s): TOTAL KNEE ARTHROPLASTY (Right)  Patient Location: PACU  Anesthesia Type:General  Level of Consciousness: sedated  Airway & Oxygen Therapy: Patient Spontanous Breathing and Patient connected to nasal cannula oxygen  Post-op Assessment: Report given to PACU RN, Post -op Vital signs reviewed and stable and Patient moving all extremities X 4  Post vital signs: Reviewed and stable  Complications: No apparent anesthesia complications

## 2012-08-29 LAB — BASIC METABOLIC PANEL
BUN: 21 mg/dL (ref 6–23)
BUN: 21 mg/dL (ref 6–23)
Calcium: 8.8 mg/dL (ref 8.4–10.5)
Creatinine, Ser: 1.18 mg/dL — ABNORMAL HIGH (ref 0.50–1.10)
GFR calc Af Amer: 49 mL/min — ABNORMAL LOW (ref >90)
GFR calc Af Amer: 53 mL/min — ABNORMAL LOW (ref >90)
GFR calc non Af Amer: 42 mL/min — ABNORMAL LOW (ref >90)
GFR calc non Af Amer: 46 mL/min — ABNORMAL LOW (ref >90)
Glucose, Bld: 128 mg/dL — ABNORMAL HIGH (ref 70–99)
Potassium: 4.1 mEq/L (ref 3.5–5.1)
Sodium: 137 mEq/L (ref 135–145)

## 2012-08-29 LAB — CBC
HCT: 26.6 % — ABNORMAL LOW (ref 36.0–46.0)
Hemoglobin: 9.2 g/dL — ABNORMAL LOW (ref 12.0–15.0)
MCH: 31.2 pg (ref 26.0–34.0)
MCHC: 34.6 g/dL (ref 30.0–36.0)
RDW: 14.9 % (ref 11.5–15.5)

## 2012-08-29 MED ORDER — SODIUM CHLORIDE 0.9 % IV SOLN: INTRAVENOUS | Status: DC

## 2012-08-29 NOTE — Progress Notes (Signed)
Orthopedic Tech Progress Note Patient Details:  Diane Daugherty 10/01/1940 [id number] On cpm at 8:30 pm RLE 0-60 Patient ID: Diane Daugherty, female   DOB: [birth year], 72 y.o.   MRN: [id number]   Diane Daugherty 08/29/2012, 8:27 PM

## 2012-08-29 NOTE — Progress Notes (Signed)
Patient ID: Diane Daugherty, female   DOB: 1941/01/12, 72 y.o.   MRN: [id number] PATIENT ID: Diane Daugherty        MRN:  [id number]          DOB/AGE: 29-May-1940 / 72 y.o.    Diane Campbell, MD   Jacqualine Code, PA-C [address] [place], [place]  [address]                             [phone]   PROGRESS NOTE  Subjective:  negative for Chest Pain  negative for Shortness of Breath  negative for Nausea/Vomiting   negative for Calf Pain    Tolerating Diet: yes         Patient reports pain as mild.     Denies SOB or chest pain-no nausea  Objective: Vital signs in last 24 hours:   Patient Vitals for the past 24 hrs:  BP Temp Temp src Pulse Resp SpO2  08/29/12 0500 102/44 mmHg 98.3 F (36.8 C) - 76 18 100 %  08/29/12 0346 - - - - 18 99 %  08/29/12 0251 104/46 mmHg 98.1 F (36.7 C) - 73 18 99 %  08/29/12 0000 - - - - 18 99 %  08/28/12 2042 101/55 mmHg 97.9 F (36.6 C) Oral 95 18 96 %  08/28/12 2000 - - - - 18 99 %  08/28/12 1600 - - - - 18 96 %  08/28/12 1513 - - - - - 94 %  08/28/12 1425 114/52 mmHg 96.1 F (35.6 C) - 77 - 94 %  08/28/12 1400 138/59 mmHg 96 F (35.6 C) - 86 14 95 %  08/28/12 1345 130/55 mmHg - - 75 11 100 %  08/28/12 1330 134/66 mmHg - - 75 10 100 %  08/28/12 1315 139/52 mmHg - - - - 100 %  08/28/12 1300 137/62 mmHg - - 83 12 99 %  08/28/12 1245 146/64 mmHg 97.5 F (36.4 C) - 89 14 97 %      Intake/Output from previous day:   08/05 0701 - 08/06 0700 In: 1960 [P.O.:360; I.V.:1600] Out: 1185 [Urine:935; Drains:150]   Intake/Output this shift:       Intake/Output     08/05 0701 - 08/06 0700 08/06 0701 - 08/07 0700   P.O. 360    I.V. 1600    Total Intake 1960     Urine 935    Drains 150    Blood 100    Total Output 1185     Net +775             LABORATORY DATA:  Recent Labs  08/22/12 1559 08/29/12 0500  WBC 8.3 9.6  HGB 12.0 9.2*  HCT 34.5* 26.6*  PLT 222 199    Recent Labs  08/22/12 1559 08/29/12 0500  NA 137 137  K 4.0  4.1  CL 101 102  CO2 29 27  BUN 14 21  CREATININE 1.15* 1.27*  GLUCOSE 102* 128*  CALCIUM 10.0 8.8   Lab Results  Component Value Date   INR 1.07 08/22/2012    Recent Radiographic Studies :  Chest 2 View  08/22/2012   *RADIOLOGY REPORT*  Clinical Data: Preop right hip replacement, hypertension, sleep apnea  CHEST - 2 VIEW  Comparison: 03/16/2010  Findings: Mild cardiac enlargement with vascular and interstitial prominence.  No definite CHF or pneumonia.  No collapse or consolidation.  No effusion or pneumothorax.  Trachea is midline. Atherosclerosis of the aorta.  Degenerative changes of the spine and shoulders.  No significant interval change.  IMPRESSION: Cardiomegaly with vascular congestion.   Original Report Authenticated By: Judie Petit. Miles Costain, M.D.     Examination:  General appearance: alert, cooperative and no distress  Wound Exam: clean, dry, intact   Drainage:  Moderate amount -120 cc in hemovac this am  Motor Exam: EHL, FHL, Anterior Tibial and Posterior Tibial Intact  Sensory Exam: Superficial Peroneal, Deep Peroneal and Tibial normal  Vascular Exam: Normal  Assessment:    1 Day Post-Op  Procedure(s) (LRB): TOTAL KNEE ARTHROPLASTY (Right)  ADDITIONAL DIAGNOSIS:  Active Problems:   * No active hospital problems. *  Acute Blood Loss Anemia-observe, asymptomatic   Plan: Physical Therapy as ordered Partial Weight Bearing @ 50% (PWB)  DVT Prophylaxis:  Xarelto  DISCHARGE PLAN: Skilled Nursing Facility/Rehab  DISCHARGE NEEDS: HHPT, CPM, Walker and 3-in-1 comode seat   OK this am-maintain hemovac for 24hrs, monitor BUN, creatinine, OOB with PT expect D/C [facility] to [facility] for rehab      Diane Daugherty W 08/29/2012 7:48 AM

## 2012-08-29 NOTE — Op Note (Signed)
Diane Daugherty, Diane Daugherty NO.:  [id number]  MEDICAL RECORD NO.:  [id number]  LOCATION:  [id number]                        FACILITY:  [facility]  PHYSICIAN:  Claude Manges. Whitfield, M.D.DATE OF BIRTH:  09-24-1940  DATE OF PROCEDURE:  08/28/2012 DATE OF DISCHARGE:                              OPERATIVE REPORT   PREOPERATIVE DIAGNOSES: 1. End-stage osteoarthritis, right knee. 2. Morbid obesity with body mass index of 42.  POSTOPERATIVE DIAGNOSES: 1. End-stage osteoarthritis, right knee. 2. Morbid obesity with body mass index of 42.  PROCEDURE:  Right total knee replacement.  SURGEON:  Claude Manges. Cleophas Dunker, M.D.  ASSISTANT:  Arlys John D. Petrarca, P.A.-C.  ANESTHESIA:  Femoral nerve block and general.  COMPLICATIONS:  None.  COMPONENTS:  DePuy LCS medium femoral component, a #2 keeled tibial tray with a 10-mm polyethylene bridging bearing, metal-backed 3 peg patella. Components were secured with polymethyl methacrylate.  PROCEDURE:  Ms. Lisbon was met with the daughter in the holding area, identified the right knee as the appropriate operative site.  She received femoral nerve block per Anesthesia.  The patient was then transported to room #7 and placed under general anesthesia without difficulty.  Nursing staff inserted Foley catheter.  Urine was clear. Tourniquet was then applied to the right thigh and the right leg was then prepped with chlorhexidine scrub and DuraPrep from the tourniquet to the tips of the toes.  Sterile draping was performed.  With extremity elevated, it was Esmarch exsanguinated with a proximal tourniquet at 350 mmHg.  A midline longitudinal incision was made, centered about the patella extending from the superior pouch to tibial tubercle.  Via sharp dissection, incision was carried down to the subcutaneous tissue.  First layer of capsule was incised in midline and medial parapatellar incision was made through the deep capsule with Bovie.  The joint was  entered. There was minimal clear-yellow joint effusion.  The patella was everted to 180 degrees laterally and knee flexed to 90 degrees.  There was abundant, thickened synovium possibly related to a prior knee arthroscopy.  Complete synovectomy was performed.  I then measured a medium femoral component.  First, bony cut was made transversely on the proximal tibia using the external tibial guide.  With each bony cut on both the tibia and femur, I checked my alignment jig.  Subsequent cuts were then made on the femur using the medium trialed jig.  LCL and MCL remained intact throughout the procedure.  A 3-degree distal femoral valgus cut was made.  The lamina spreaders were inserted in the medial and lateral compartment and medial and lateral menisci were removed as well as ACL and PCL as well as abundant, thickened synovium posteriorly.  Osteophytes were removed from the posterior femoral condyles using the 3/4-inch curved osteotome.  Final cut was then made on the femur for tapering and obtaining the center holes using the medium femoral guide.  Retractors were then placed about the tibia was advanced anteriorly and measured a #2 tibial tray.  This was pinned in place.  The center hole made followed by the keeled cut.  With the tibial jig in place, the 10- mm bridging bearing was inserted as the flexion  and extension gaps were perfectly symmetrical at 10 mm.  The medium femoral trial was then applied and reduced and through a full range of motion, the knee had excellent.  There was no instability and had full extension with slight hyperextension and flexion without malrotation of the tibial components.  The patella was prepared by removing 10 mm of patellar bone leaving 13 mm of patellar thickness.  Three center holes were then made.  The trial patella was inserted and then reduced.  Through a full range of motion, there was no evidence of instability.  The trial components were  then removed.  The joint was copiously irrigated with saline solution.  The final components were then impacted with polymethyl methacrylate. We initially impacted the tibia followed by the polyethylene bridging bearing and then the medium sized femur.  These were impacted with the knee in extension.  Any extraneous methacrylate was removed and the periphery of the components.  The knee was left in extension until the methacrylate matured.  The patella was applied with methacrylate and the patellar clamp.  While waiting for the methacrylate to mature, we injected the knee with 0.25% Marcaine with epinephrine.  At approximately 16 minutes, the methacrylate had matured.  The joint was inspected.  There was no further extraneous methacrylate.  The tourniquet was deflated at 73 minutes.  We had immediate capillary refill to the joint surface.  Gross bleeders with Bovie coagulated. Medium sized Hemovac was then inserted.  Thrombin spray was placed around the joint surface, prior to which, we re-irrigated.  Deep capsule was closed with interrupted 0 Ethibond.  There were several areas where we ran the stitch proximally and distally.  The superficial capsule was closed with a running 0 Vicryl, subcu with 3-0 Monocryl and the skin with skin clips.  Sterile bulky dressing was applied.  We did clamp the Hemovac.  The patient tolerated the procedure without complications.     Claude Manges. Cleophas Dunker, M.D.     PWW/MEDQ  D:  08/28/2012  T:  08/29/2012  Job:  [id number]

## 2012-08-29 NOTE — Clinical Social Work Psychosocial (Signed)
Clinical Social Work Department  BRIEF PSYCHOSOCIAL ASSESSMENT  Patient: Diane Daugherty  Account Number: [id number]  Admit date: 08/28/12 Clinical Social Worker Sabino Niemann, MSW Date/Time: 08/29/2012 1030 AM Referred by: Physician Date Referred: 08/29/2012 Referred for   SNF Placement   Other Referral:  Interview type: Patient  Other interview type: PSYCHOSOCIAL DATA  Living Status: Alone Admitted from facility:  Level of care:  Primary support name: Diane Daugherty Primary support relationship to patient: Daughter Degree of support available:  Strong and vested  CURRENT CONCERNS  Current Concerns   Post-Acute Placement   Other Concerns:  SOCIAL WORK ASSESSMENT / PLAN  CSW met with pt re: PT recommendation for SNF.   Pt lives alone   CSW explained placement process and answered questions.   Pt reports [facility]  as her preference    CSW completed FL2 and initiated SNF search.     Assessment/plan status: Information/Referral to [facility]  Other assessment/ plan:  Information/referral to community resources:  SNF   PTAR  PATIENT'S/FAMILY'S RESPONSE TO PLAN OF CARE:  Pt  reports she is agreeable to [facility] in order to increase strength and independence with mobility prior to returning home  Pt verbalized understanding of placement process and appreciation for CSW assist.   Sabino Niemann, MSW [phone]

## 2012-08-29 NOTE — Progress Notes (Signed)
   CARE MANAGEMENT NOTE 08/29/2012  Patient:  Diane Daugherty, Diane Daugherty   Account Number:  [id number]  Date Initiated:  08/29/2012  Documentation initiated by:  [facility]  Subjective/Objective Assessment:   right total Knee     Action/Plan:   SNF-[facility]   Anticipated DC Date:  08/31/2012   Anticipated DC Plan:  SKILLED NURSING FACILITY      DC Planning Services  CM consult      Choice offered to / List presented to:     DME arranged  CPM      DME agency  [employer]        Status of service:  Completed, signed off Medicare Important Message given?   (If response is "NO", the following Medicare IM given date fields will be blank) Date Medicare IM given:   Date Additional Medicare IM given:    Discharge Disposition:  SKILLED NURSING FACILITY  Per UR Regulation:    If discussed at Long Length of Stay Meetings, dates discussed:    Comments:

## 2012-08-29 NOTE — Progress Notes (Signed)
Physical Therapy Treatment Patient Details Name: SHARYAH BOSTWICK MRN: [id number] DOB: 25-Nov-1940 Today's Date: 08/29/2012 Time: 0750-0820 PT Time Calculation (min): 30 min  PT Assessment / Plan / Recommendation  History of Present Illness Pt admitted for R TKA   PT Comments   Patient progressing well this morning. Tolerated increased ambulation and therex without increased discomfort. Continue to recommend SNF at discharge as patient lives alone and needs to increase functional independence  Follow Up Recommendations        Does the patient have the potential to tolerate intense rehabilitation     Barriers to Discharge        Equipment Recommendations       Recommendations for Other Services    Frequency     Progress towards PT Goals Progress towards PT goals: Progressing toward goals  Plan      Precautions / Restrictions Precautions Precautions: Knee Restrictions RLE Weight Bearing: Partial weight bearing RLE Partial Weight Bearing Percentage or Pounds: 50   Pertinent Vitals/Pain 5/10 R knee. patient repositioned for comfort    Mobility  Bed Mobility Supine to Sit: 4: Min assist Details for Bed Mobility Assistance: A with R LE. Cues for positioning Transfers Sit to Stand: With upper extremity assist;From bed;4: Min guard Stand to Sit: With upper extremity assist;To chair/3-in-1;4: Min guard Details for Transfer Assistance: v/c's for hand placement and R LE management Ambulation/Gait Ambulation/Gait Assistance: 4: Min assist Ambulation Distance (Feet): 40 Feet Assistive device: Rolling walker Ambulation/Gait Assistance Details: VCs for sequence and RW management. A for RW Gait Pattern: Step-to pattern;Decreased stride length Gait velocity: slow    Exercises Total Joint Exercises Quad Sets: AROM;Right;10 reps;Supine Heel Slides: AAROM;Right;10 reps Hip ABduction/ADduction: AAROM;Right;10 reps Straight Leg Raises: AAROM;Right;10 reps   PT Diagnosis:    PT Problem  List:   PT Treatment Interventions:     PT Goals (current goals can now be found in the care plan section)    Visit Information  Last PT Received On: 08/29/12 Assistance Needed: +1 History of Present Illness: Pt admitted for R TKA    Subjective Data      Cognition  Cognition Arousal/Alertness: Awake/alert Behavior During Therapy: WFL for tasks assessed/performed Overall Cognitive Status: Within Functional Limits for tasks assessed    Balance     End of Session PT - End of Session Equipment Utilized During Treatment: Gait belt Activity Tolerance: Patient tolerated treatment well Patient left: in chair;with call bell/phone within reach;with family/visitor present Nurse Communication: Mobility status   GP     Fredrich Birks 08/29/2012, 8:26 AM  08/29/2012 Fredrich Birks PTA [phone] pager [phone] office

## 2012-08-29 NOTE — Progress Notes (Signed)
Physical Therapy Treatment Patient Details Name: Diane Daugherty MRN: [id number] DOB: 1940/03/24 Today's Date: 08/29/2012 Time: 1315-1340 PT Time Calculation (min): 25 min  PT Assessment / Plan / Recommendation  History of Present Illness Pt admitted for R TKA   PT Comments   Patient progressing with ambulation. Anticipate DC to [facility] tomorrow  Follow Up Recommendations  SNF     Does the patient have the potential to tolerate intense rehabilitation     Barriers to Discharge        Equipment Recommendations  None recommended by PT    Recommendations for Other Services    Frequency 7X/week   Progress towards PT Goals Progress towards PT goals: Progressing toward goals  Plan Current plan remains appropriate    Precautions / Restrictions Precautions Precautions: Knee Restrictions RLE Weight Bearing: Partial weight bearing RLE Partial Weight Bearing Percentage or Pounds: 50   Pertinent Vitals/Pain no apparent distress     Mobility  Bed Mobility Bed Mobility: Sit to Supine Sit to Supine: 3: Mod assist Details for Bed Mobility Assistance: A for BLEs. Cues for postioning back into bed Transfers Sit to Stand: With upper extremity assist;4: Min guard;From chair/3-in-1 Stand to Sit: With upper extremity assist;4: Min guard;To bed Details for Transfer Assistance: v/c's for hand placement and R LE management Ambulation/Gait Ambulation/Gait Assistance: 4: Min guard Ambulation Distance (Feet): 60 Feet Assistive device: Rolling walker Ambulation/Gait Assistance Details: Cues for posture Gait Pattern: Step-to pattern Gait velocity: slow    Exercises     PT Diagnosis:    PT Problem List:   PT Treatment Interventions:     PT Goals (current goals can now be found in the care plan section)    Visit Information  Last PT Received On: 08/29/12 Assistance Needed: +1 History of Present Illness: Pt admitted for R TKA    Subjective Data      Cognition   Cognition Arousal/Alertness: Awake/alert Behavior During Therapy: WFL for tasks assessed/performed Overall Cognitive Status: Within Functional Limits for tasks assessed    Balance     End of Session PT - End of Session Equipment Utilized During Treatment: Gait belt Activity Tolerance: Patient tolerated treatment well Patient left: in bed;in CPM CPM Right Knee CPM Right Knee: On Right Knee Flexion (Degrees): 60 Right Knee Extension (Degrees): 0   GP     Daugherty, Diane Potter 08/29/2012, 2:26 PM  08/29/2012 Diane Daugherty PTA [phone] pager [phone] office

## 2012-08-29 NOTE — Clinical Social Work Placement (Addendum)
Clinical Social Work Department  CLINICAL SOCIAL WORK PLACEMENT NOTE  08/29/2012 Patient: Diane Daugherty  Account Number: [id number]  Admit date: 08/28/2012 Clinical Social Worker: Sabino Niemann MSW Date/time: 08/29/2012 11:30 AM  Clinical Social Work is seeking post-discharge placement for this patient at the following level of care: SKILLED NURSING (*CSW will update this form in Epic as items are completed)  08/29/2012 Patient/family provided with [facility] Department of Clinical Social Work's list of facilities offering this level of care within the geographic area requested by the patient (or if unable, by the patient's family).  08/29/2012 Patient/family informed of their freedom to choose among providers that offer the needed level of care, that participate in Medicare, Medicaid or managed care program needed by the patient, have an available bed and are willing to accept the patient.  08/29/2012 Patient/family informed of [facility]' ownership interest in [facility], as well as of the fact that they are under no obligation to receive care at this facility.  PASARR submitted to EDS on 08/29/2012  PASARR number received from EDS on 08/29/2012 FL2 transmitted to all facilities in geographic area requested by pt/family on 08/29/2012 FL2 transmitted to all facilities within larger geographic area on  Patient informed that his/her managed care company has contracts with or will negotiate with certain facilities, including the following:  Patient/family informed of bed offers received: 08/29/2012 Patient chooses bed at [facility] Physician recommends and patient chooses bed at  Patient to be transferred to on 08/31/2012 Patient to be transferred to facility by Private Vehicle The following physician request were entered in Epic:  Additional Comments:

## 2012-08-30 ENCOUNTER — Encounter (HOSPITAL_COMMUNITY): Payer: Self-pay | Admitting: Orthopaedic Surgery

## 2012-08-30 DIAGNOSIS — M1711 Unilateral primary osteoarthritis, right knee: Secondary | ICD-10-CM | POA: Diagnosis present

## 2012-08-30 DIAGNOSIS — I10 Essential (primary) hypertension: Secondary | ICD-10-CM | POA: Diagnosis not present

## 2012-08-30 DIAGNOSIS — G4733 Obstructive sleep apnea (adult) (pediatric): Secondary | ICD-10-CM | POA: Diagnosis present

## 2012-08-30 DIAGNOSIS — J45909 Unspecified asthma, uncomplicated: Secondary | ICD-10-CM | POA: Diagnosis not present

## 2012-08-30 DIAGNOSIS — G4730 Sleep apnea, unspecified: Secondary | ICD-10-CM | POA: Diagnosis present

## 2012-08-30 LAB — BASIC METABOLIC PANEL
Chloride: 102 mEq/L (ref 96–112)
GFR calc Af Amer: 63 mL/min — ABNORMAL LOW (ref >90)
GFR calc non Af Amer: 54 mL/min — ABNORMAL LOW (ref >90)
Potassium: 3.9 mEq/L (ref 3.5–5.1)
Sodium: 135 mEq/L (ref 135–145)

## 2012-08-30 LAB — CBC
MCHC: 35.2 g/dL (ref 30.0–36.0)
Platelets: 172 10*3/uL (ref 150–400)
RDW: 14.9 % (ref 11.5–15.5)
WBC: 9.1 10*3/uL (ref 4.0–10.5)

## 2012-08-30 MED ORDER — ACETAMINOPHEN 325 MG PO TABS: 650.0000 mg | ORAL_TABLET | Freq: Four times a day (QID) | ORAL | Status: DC | PRN

## 2012-08-30 MED ORDER — RIVAROXABAN 10 MG PO TABS: 10.0000 mg | ORAL_TABLET | ORAL | Status: DC

## 2012-08-30 MED ORDER — HYDROMORPHONE HCL 2 MG PO TABS: 2.0000 mg | ORAL_TABLET | ORAL | Status: DC | PRN

## 2012-08-30 NOTE — Discharge Summary (Signed)
Diane Campbell, MD   Diane Code, PA-C [address], [place], [place]  [address]                             [phone]  PATIENT ID: Diane Daugherty        MRN:  [id number]          DOB/AGE: 1940/10/15 / 72 y.o.    DISCHARGE SUMMARY  ADMISSION DATE:    08/28/2012 DISCHARGE DATE:   08/31/2012   ADMISSION DIAGNOSIS: Right Knee Osteoarthritis    DISCHARGE DIAGNOSIS:  Right Knee Osteoarthritis    ADDITIONAL DIAGNOSIS: Principal Problem:   Osteoarthritis of right knee Active Problems:   Asthma, chronic   Hypertension   Sleep apnea   Severe obesity (BMI >= 40)  Past Medical History  Diagnosis Date  . Hypertension     takes Hyzaar daily  . Muscle spasms of lower extremity     takes Baclofen daily prn  . PONV (postoperative nausea and vomiting)     woke up during hysterectomy  . Family history of anesthesia complication     daughter gets sick  . Peripheral vascular disease   . Sleep apnea     sleep study to be requested [facility];uses CPAP  . Pneumonia     hx of;several times but none since 2011  . History of bronchitis   . Shortness of breath     with exertion;Albuterol prn  . Carpal tunnel syndrome   . Arthritis   . Joint pain   . Joint swelling   . Urinary frequency   . Nocturia   . History of blood transfusion     no abnormal reaction noted  . Cataract     immature on right  . PFO (patent foramen ovale)     positive saline bubble study 05/12/09 [facility])    PROCEDURE: Procedure(s): TOTAL KNEE ARTHROPLASTY [place] 08/28/2012  CONSULTS: none     HISTORY: Diane Daugherty is a 72 year old female who is seen today for evaluation of her right knee. She has been seen previously by Dr. Viviann Spare Case at [facility]. Had an MRI scan in May of 2013 revealing moderate medial greater than lateral compartment degenerative changes with chondral thinning and osteophytes. Medial meniscus was extruded from the joint and the posterior horn and body were diminutive with diffuse free  edge irregularity but nothing centrally displaced meniscal fragment. Also the ACL was mucoid degenerative. Ligaments were intact. He did perform an arthroscopic debridement in June of 2013. The patient stated that it really had no benefit to her. She has had cortisone injections and possibly even viscosupplementation with minimal benefit. She does live alone and certainly this is causing difficulty with her independence. She does have crepitance and giving way of the knee. She has difficulty getting in and out of the car and certainly doing her activities of daily living. She is presently on sulindac. She uses Arthritis Strength Tylenol as well as water aerobics and moist heat and muscle relaxers but all these do not really give her much benefit. She is pretty much now having pain, which is moderately severe, constant, aching and throbbing with occasional sharpness. Nothing is really improving it and certainly activity worsens her symptoms.   HOSPITAL COURSE:  Diane Daugherty is a 72 y.o. admitted on 08/28/2012 and found to have a diagnosis of Right Knee Osteoarthritis.  After appropriate laboratory studies were obtained  they were taken to the operating  room on 08/28/2012 and underwent  Procedure(s): TOTAL KNEE ARTHROPLASTY  Right.   They were given perioperative antibiotics:  Anti-infectives   Start     Dose/Rate Route Frequency Ordered Stop   08/28/12 1600  ceFAZolin (ANCEF) IVPB 2 g/50 mL premix     2 g 100 mL/hr over 30 Minutes Intravenous Every 6 hours 08/28/12 1512 08/28/12 2227   08/28/12 0600  ceFAZolin (ANCEF) IVPB 2 g/50 mL premix     2 g 100 mL/hr over 30 Minutes Intravenous On call to O.R. 08/27/12 1425 08/28/12 1027    .  Tolerated the procedure well.  Placed with a foley intraoperatively.     Toradol was given post op.  POD #1, allowed out of bed to a chair.  PT for ambulation and exercise program.  Foley D/C'd in morning.  IV saline locked.  O2 discontionued.  POD #2, continued PT and  ambulation.  Hemovac pulled.  No symptoms of ABLA  POD #3, continued PT and ambulation. Discharge to [facility]   . The remainder of the hospital course was dedicated to ambulation and strengthening.   The patient was discharged on 3 Days Post-Op in  Stable condition.  Blood products given:none  DIAGNOSTIC STUDIES: Recent vital signs:  Patient Vitals for the past 24 hrs:  BP Temp Temp src Pulse Resp SpO2  08/30/12 2324 - - - 110 18 98 %  08/30/12 2041 131/50 mmHg 98.7 F (37.1 C) Oral 112 18 98 %  08/30/12 1433 124/58 mmHg 98.8 F (37.1 C) - 95 18 97 %  08/30/12 0546 134/50 mmHg 99.3 F (37.4 C) - 98 18 95 %       Recent laboratory studies:  Recent Labs  08/29/12 0500 08/30/12 0500  WBC 9.6 9.1  HGB 9.2* 8.2*  HCT 26.6* 23.3*  PLT 199 172    Recent Labs  08/29/12 0500 08/29/12 1353 08/30/12 0500  NA 137 136 135  K 4.1 3.3* 3.9  CL 102 98 102  CO2 27 29 28   BUN 21 21 16   CREATININE 1.27* 1.18* 1.03  GLUCOSE 128* 141* 116*  CALCIUM 8.8 8.7 8.4   Lab Results  Component Value Date   INR 1.07 08/22/2012     Recent Radiographic Studies :  Chest 2 View  08/22/2012   *RADIOLOGY REPORT*  Clinical Data: Preop right hip replacement, hypertension, sleep apnea  CHEST - 2 VIEW  Comparison: 03/16/2010  Findings: Mild cardiac enlargement with vascular and interstitial prominence.  No definite CHF or pneumonia.  No collapse or consolidation.  No effusion or pneumothorax.  Trachea is midline. Atherosclerosis of the aorta.  Degenerative changes of the spine and shoulders.  No significant interval change.  IMPRESSION: Cardiomegaly with vascular congestion.   Original Report Authenticated By: Judie Petit. Shick, M.D.    DISCHARGE INSTRUCTIONS:     Discharge Orders   Future Orders Complete By Expires     CPM  As directed     Comments:      Continuous passive motion machine (CPM):      Use the CPM from 0 to 60 for 6-8 hours per day.      You may increase by 5-10 per day.  You may  break it up into 2 or 3 sessions per day.      Use CPM for 3-4 weeks or until you are told to stop.    Call MD / Call 911  As directed     Comments:  If you experience chest pain or shortness of breath, CALL 911 and be transported to the hospital emergency room.  If you develope a fever above 101 F, pus (white drainage) or increased drainage or redness at the wound, or calf pain, call your surgeon's office.    Change dressing  As directed     Comments:      Change dressing on SUNDAY, then change the dressing daily with sterile 4 x 4 inch gauze dressing and apply TED hose.  You may clean the incision with alcohol prior to redressing.    Constipation Prevention  As directed     Comments:      Drink plenty of fluids.  Prune juice and/or coffee may be helpful.  You may use a stool softener, such as Colace (over the counter) 100 mg twice a day.  Use MiraLax (over the counter) for constipation as needed but this may take several days to work.  Mag Citrate --OR-- Milk of Magnesia may also be used but follow directions on the label.    Diet general  As directed     Do not put a pillow under the knee. Place it under the heel.  As directed     Driving restrictions  As directed     Comments:      No driving for 6 weeks    Increase activity slowly as tolerated  As directed     Lifting restrictions  As directed     Comments:      No lifting for 6 weeks    Partial weight bearing  As directed     Comments:      50  % WEIGHT BEARING AS TAUGHT IN PHYSICAL THERAPY    Patient may shower  As directed     Comments:      You may shower over the brown dressing.  Once the dressing is removed you may shower without a dressing once there is no drainage.  Do not wash over the wound.  If drainage remains, cover wound with plastic wrap and then shower.    TED hose  As directed     Comments:      Use stockings (TED hose) for 2 weeks on operative leg(s).  You may remove them at night for sleeping.        DISCHARGE MEDICATIONS:     Medication List    STOP taking these medications       CENTRUM SILVER ADULT 50+ Tabs     KRILL OIL OMEGA-3 PO     sulindac 200 MG tablet  Commonly known as:  CLINORIL      TAKE these medications       acetaminophen 325 MG tablet  Commonly known as:  TYLENOL  Take 2 tablets (650 mg total) by mouth every 6 (six) hours as needed.     albuterol 108 (90 BASE) MCG/ACT inhaler  Commonly known as:  PROVENTIL HFA;VENTOLIN HFA  Inhale 2 puffs into the lungs every 6 (six) hours as needed for wheezing or shortness of breath.     baclofen 20 MG tablet  Commonly known as:  LIORESAL  Take 10 mg by mouth daily as needed (muscle spasms).     CALCIUM CITRATE + D PO  Take 1 tablet by mouth daily.     HYDROmorphone 2 MG tablet  Commonly known as:  DILAUDID  Take 1 tablet (2 mg total) by mouth every 4 (four) hours as needed.     losartan-hydrochlorothiazide 100-25  MG per tablet  Commonly known as:  HYZAAR  Take 1 tablet by mouth daily.     rivaroxaban 10 MG Tabs tablet  Commonly known as:  XARELTO  Take 1 tablet (10 mg total) by mouth daily.        FOLLOW UP VISIT:   Follow-up Information   Follow up with Brette Cast, PA-C. Schedule an appointment as soon as possible for a visit on 09/12/2012.   Contact information:   [address] [place] [address] [phone]       DISPOSITION:   Skilled Nursing Facility/Rehab  CONDITION:  Stable   Diane Daugherty 08/31/2012, 5:15 AM

## 2012-08-30 NOTE — Progress Notes (Signed)
Orthopedic Tech Progress Note Patient Details:  ANJANAE WOEHRLE 1940-07-10 [id number] On cpm at 3:20 pm RLE 0-60 Patient ID: MARITZA HOSTERMAN, female   DOB: 10-Aug-1940, 72 y.o.   MRN: [id number]   Jennye Moccasin 08/30/2012, 3:19 PM

## 2012-08-30 NOTE — Progress Notes (Signed)
Physical Therapy Treatment Patient Details Name: Diane Daugherty MRN: [id number] DOB: [birth year] Today's Date: 08/30/2012 Time: 1478-2956 PT Time Calculation (min): 30 min  PT Assessment / Plan / Recommendation  History of Present Illness Pt admitted for R TKA   PT Comments   Pt is progressing slowly towards physical therapy goals. Pt continues to demonstrate very slow gait speed, however is able to slightly increase cadence with cueing. Pt in increased pain during session and was set up in CPM -3 to 60 degrees at end of session (at 1624).   Follow Up Recommendations  SNF     Does the patient have the potential to tolerate intense rehabilitation     Barriers to Discharge        Equipment Recommendations  None recommended by PT    Recommendations for Other Services    Frequency 7X/week   Progress towards PT Goals Progress towards PT goals: Progressing toward goals  Plan Current plan remains appropriate    Precautions / Restrictions Precautions Precautions: Knee Restrictions Weight Bearing Restrictions: Yes RLE Weight Bearing: Partial weight bearing RLE Partial Weight Bearing Percentage or Pounds: 50   Pertinent Vitals/Pain Pt reports increased pain during session but is not rated on 0-10 scale at rest. 7/10 pain reported at end of session.    Mobility  Bed Mobility Bed Mobility: Supine to Sit;Sit to Supine;Scooting to [facility];Sitting - Scoot to Edge of Bed Supine to Sit: 4: Min guard Sitting - Scoot to [place] of Bed: 4: Min guard Sit to Supine: 3: Mod assist Scooting to [facility]: 5: Set up Details for Bed Mobility Assistance: Pt required assist for bilateral LE's, and cueing for sequencing Transfers Transfers: Sit to Stand;Stand to Sit Sit to Stand: 5: Supervision;From bed Stand to Sit: 5: Supervision;To bed Details for Transfer Assistance: VC's for placement of R LE before initiating transfer Ambulation/Gait Ambulation/Gait Assistance: 4: Min guard Ambulation Distance (Feet): 60  Feet Assistive device: Rolling walker Ambulation/Gait Assistance Details: Pt was cued for step-through gait pattern, with increased fluid movement of walker. Last 25 feet pt was cued for increased gait speed Gait Pattern: Step-to pattern;Step-through pattern Gait velocity: decreased Stairs: No    Exercises Total Joint Exercises Ankle Circles/Pumps: 15 reps;Both Quad Sets: 15 reps;Both Heel Slides: 10 reps;Seated;Right Hip ABduction/ADduction: 15 reps;Right;AAROM   PT Diagnosis:    PT Problem List:   PT Treatment Interventions:     PT Goals (current goals can now be found in the care plan section) Acute Rehab PT Goals Patient Stated Goal: rehab to then go home PT Goal Formulation: With patient Time For Goal Achievement: 09/04/12 Potential to Achieve Goals: Good  Visit Information  Last PT Received On: 08/30/12 Assistance Needed: +1 History of Present Illness: Pt admitted for R TKA    Subjective Data  Subjective: "I just got back into bed from the bathroom." Patient Stated Goal: rehab to then go home   Cognition  Cognition Arousal/Alertness: Awake/alert Behavior During Therapy: WFL for tasks assessed/performed Overall Cognitive Status: Within Functional Limits for tasks assessed    Balance     End of Session PT - End of Session Equipment Utilized During Treatment: Gait belt Activity Tolerance: Patient tolerated treatment well Patient left: in bed;in CPM;with call bell/phone within reach   GP     Ruthann Cancer 08/30/2012, 5:06 PM  Ruthann Cancer, PT Acute Rehabilitation Services

## 2012-08-30 NOTE — Progress Notes (Signed)
Patient ID: Diane Daugherty, female   DOB: 08-18-1940, 72 y.o.   MRN: [id number] PATIENT ID: Diane Daugherty        MRN:  [id number]          DOB/AGE: March 25, 1940 / 72 y.o.    [name], MD   Jacqualine Code, PA-C [address] [place], [place]  [address]                             [phone]   PROGRESS NOTE  Subjective:  negative for Chest Pain  negative for Shortness of Breath  negative for Nausea/Vomiting   negative for Calf Pain    Tolerating Diet: yes         Patient reports pain as mild and moderate.     Ambulated to bathroom easily and out into hall per patient.  Denies being dizzy or lightheaded   Objective: Vital signs in last 24 hours:   Patient Vitals for the past 24 hrs:  BP Temp Pulse Resp SpO2  08/30/12 0546 134/50 mmHg 99.3 F (37.4 C) 98 18 95 %  08/30/12 0400 - - - 18 95 %  08/30/12 0000 - - - 18 95 %  08/29/12 2240 - - 80 18 -  08/29/12 2210 140/43 mmHg 99.5 F (37.5 C) 89 18 99 %  08/29/12 2000 - - - 17 100 %  08/29/12 1400 98/47 mmHg 98.2 F (36.8 C) 78 17 100 %      Intake/Output from previous day:   08/06 0701 - 08/07 0700 In: 1800 [P.O.:1800] Out: 1545 [Urine:1200; Drains:345]   Intake/Output this shift:   08/07 0701 - 08/07 1900 In: 240 [P.O.:240] Out: -    Intake/Output     08/06 0701 - 08/07 0700 08/07 0701 - 08/08 0700   P.O. 1800 240   I.V.     Total Intake 1800 240   Urine 1200    Drains 345    Blood     Total Output 1545     Net +255 +240        Urine Occurrence 8 x 3 x   Stool Occurrence        LABORATORY DATA:  Recent Labs  08/29/12 0500 08/30/12 0500  WBC 9.6 9.1  HGB 9.2* 8.2*  HCT 26.6* 23.3*  PLT 199 172    Recent Labs  08/29/12 0500 08/29/12 1353 08/30/12 0500  NA 137 136 135  K 4.1 3.3* 3.9  CL 102 98 102  CO2 27 29 28   BUN 21 21 16   CREATININE 1.27* 1.18* 1.03  GLUCOSE 128* 141* 116*  CALCIUM 8.8 8.7 8.4   Lab Results  Component Value Date   INR 1.07 08/22/2012    Recent  Radiographic Studies :  Chest 2 View  08/22/2012   *RADIOLOGY REPORT*  Clinical Data: Preop right hip replacement, hypertension, sleep apnea  CHEST - 2 VIEW  Comparison: 03/16/2010  Findings: Mild cardiac enlargement with vascular and interstitial prominence.  No definite CHF or pneumonia.  No collapse or consolidation.  No effusion or pneumothorax.  Trachea is midline. Atherosclerosis of the aorta.  Degenerative changes of the spine and shoulders.  No significant interval change.  IMPRESSION: Cardiomegaly with vascular congestion.   Original Report Authenticated By: Judie Petit. Miles Costain, M.D.     Examination:  General appearance: alert, cooperative, mild distress, moderate distress, moderately obese and morbidly obese Resp: clear to auscultation bilaterally Cardio: regular rate  and rhythm GI: normal findings: bowel sounds normal  Wound Exam: clean, dry, intact   Drainage:  None: wound tissue dry  Motor Exam: EHL, FHL, Anterior Tibial and Posterior Tibial Intact  Sensory Exam: Superficial Peroneal, Deep Peroneal and Tibial normal  Vascular Exam: Right dorsalis pedis artery has trace pulse  Assessment:    2 Days Post-Op  Procedure(s) (LRB): TOTAL KNEE ARTHROPLASTY (Right)  ADDITIONAL DIAGNOSIS:  Active Problems:   * No active hospital problems. *  Acute Blood Loss Anemia, Renal Insufficiency Chronic and Sleep Apnea   Plan: Physical Therapy as ordered Partial Weight Bearing @ 50% (PWB)  DVT Prophylaxis:  Xarelto, Foot Pumps and TED hose  DISCHARGE PLAN: Skilled Nursing Facility/Rehab  DISCHARGE NEEDS: HHPT, CPM, Walker and 3-in-1 comode seat  Plan D/C to [facility] tomorrow         [facility] 08/30/2012 1:47 PM

## 2012-08-30 NOTE — Progress Notes (Signed)
Physical Therapy Treatment Patient Details Name: Diane Daugherty MRN: [id number] DOB: March 26, 1940 Today's Date: 08/30/2012 Time: 9528-4132 PT Time Calculation (min): 33 min  PT Assessment / Plan / Recommendation  History of Present Illness Pt admitted for R TKA   PT Comments   Pt progressing towards physical therapy goals. Pt was able to negotiate in the restroom to void, clean self and wash hands with supervision and use of RW. Ambulation distance limited by muscular shoulder pain from using walker, however pt was able to make corrective changes with cueing throughout gait training. D/C to SNF still appropriate at this time for further rehabilitation.  Follow Up Recommendations  SNF     Does the patient have the potential to tolerate intense rehabilitation     Barriers to Discharge        Equipment Recommendations  None recommended by PT    Recommendations for Other Services    Frequency 7X/week   Progress towards PT Goals Progress towards PT goals: Progressing toward goals  Plan Current plan remains appropriate    Precautions / Restrictions Precautions Precautions: Knee Restrictions Weight Bearing Restrictions: Yes RLE Weight Bearing: Partial weight bearing RLE Partial Weight Bearing Percentage or Pounds: 50   Pertinent Vitals/Pain Pt reports 6/10 pain in R knee throughout therapeutic exercise, and also reports some pain in shoulders from using the walker.    Mobility  Bed Mobility Bed Mobility: Not assessed Transfers Transfers: Sit to Stand;Stand to Sit Sit to Stand: 5: Supervision;From chair/3-in-1;With upper extremity assist Stand to Sit: 5: Supervision;To chair/3-in-1;With upper extremity assist Details for Transfer Assistance: VC's for placement of R LE before initiating transfer Ambulation/Gait Ambulation/Gait Assistance: 4: Min guard Ambulation Distance (Feet): 60 Feet Assistive device: Rolling walker Ambulation/Gait Assistance Details: Pt was cued for increased  heel strike bilaterally, with longer stride on L for step-through gait pattern Gait Pattern: Step-to pattern;Step-through pattern Gait velocity: decreased Stairs: No    Exercises Total Joint Exercises Ankle Circles/Pumps: 15 reps;Both Quad Sets: 15 reps;Both Heel Slides: 10 reps;Seated;AAROM;Right (5-10 second hold) Straight Leg Raises: 10 reps;AAROM;Right Long Arc Quad: 5 reps;AAROM;Right Goniometric ROM: 6-67 degrees AAROM   PT Diagnosis:    PT Problem List:   PT Treatment Interventions:     PT Goals (current goals can now be found in the care plan section) Acute Rehab PT Goals Patient Stated Goal: rehab to then go home PT Goal Formulation: With patient Time For Goal Achievement: 09/04/12 Potential to Achieve Goals: Good  Visit Information  Last PT Received On: 08/30/12 Assistance Needed: +1 History of Present Illness: Pt admitted for R TKA    Subjective Data  Subjective: "I'm doing okay today - feeling a little sore in my shoulders from using the walker." Patient Stated Goal: rehab to then go home   Cognition  Cognition Arousal/Alertness: Awake/alert Behavior During Therapy: WFL for tasks assessed/performed Overall Cognitive Status: Within Functional Limits for tasks assessed    Balance     End of Session PT - End of Session Equipment Utilized During Treatment: Gait belt Activity Tolerance: Patient tolerated treatment well Patient left: in chair;with call bell/phone within reach   GP     Ruthann Cancer 08/30/2012, 11:43 AM  Ruthann Cancer, PT Acute Rehabilitation Services

## 2012-08-30 NOTE — Progress Notes (Signed)
Orthopedic Tech Progress Note Patient Details:  KINDSEY EBLIN Jan 16, 1941 [id number] On cpm at 8:30 pm RLE 0--60 Patient ID: LOANY NEUROTH, female   DOB: Jun 16, 1940, 72 y.o.   MRN: [id number]   Jennye Moccasin 08/30/2012, 8:31 PM

## 2012-08-30 NOTE — Progress Notes (Signed)
Patient requested prn suppository.  First attempt to insert failed, removed second suppository from Pyxis.  Second attempt successful.

## 2012-08-31 LAB — BASIC METABOLIC PANEL
CO2: 29 mEq/L (ref 19–32)
Chloride: 98 mEq/L (ref 96–112)
GFR calc non Af Amer: 55 mL/min — ABNORMAL LOW (ref >90)
Glucose, Bld: 109 mg/dL — ABNORMAL HIGH (ref 70–99)
Potassium: 3.8 mEq/L (ref 3.5–5.1)
Sodium: 135 mEq/L (ref 135–145)

## 2012-08-31 LAB — CBC
Hemoglobin: 8.4 g/dL — ABNORMAL LOW (ref 12.0–15.0)
MCHC: 34.3 g/dL (ref 30.0–36.0)
RBC: 2.72 MIL/uL — ABNORMAL LOW (ref 3.87–5.11)
WBC: 9 10*3/uL (ref 4.0–10.5)

## 2012-08-31 NOTE — Progress Notes (Signed)
Physical Therapy Treatment Patient Details Name: Diane Daugherty MRN: [id number] DOB: [birth year] Today's Date: 08/31/2012 Time: 4098-1191 PT Time Calculation (min): 23 min  PT Assessment / Plan / Recommendation  History of Present Illness Pt admitted for R TKA   PT Comments   Pt. Completed exercises while seated in recliner chair.  She is compliant with independent exercises of ankle pumps and quad sets and was instructed in gluteal sets.  Pt. Able to state precaution of no pillow under knee.  Is hard worker and expect eventual good outcome.  Follow Up Recommendations  SNF     Does the patient have the potential to tolerate intense rehabilitation     Barriers to Discharge        Equipment Recommendations  None recommended by PT    Recommendations for Other Services    Frequency 7X/week   Progress towards PT Goals Progress towards PT goals: Progressing toward goals  Plan Current plan remains appropriate    Precautions / Restrictions Precautions Precautions: Knee Restrictions Weight Bearing Restrictions: Yes RLE Weight Bearing: Partial weight bearing RLE Partial Weight Bearing Percentage or Pounds: 50   Pertinent Vitals/Pain See vitals tab     Mobility  Bed Mobility Bed Mobility: Not assessed Transfers Transfers: Not assessed Sit to Stand: 5: Supervision;From chair/3-in-1;From toilet Stand to Sit: 5: Supervision;To toilet;To chair/3-in-1 Details for Transfer Assistance: reminder of R LE placement for comfort in transfer Ambulation/Gait Ambulation/Gait Assistance: Not tested  Stairs: No    Exercises Total Joint Exercises Ankle Circles/Pumps: 15 reps;Both Quad Sets: 15 reps;Both Gluteal Sets: AROM;Both;10 reps Short Arc Quad: AROM;Right;15 reps Hip ABduction/ADduction: 15 reps;Right;AAROM Knee Flexion: AROM;AAROM;Right;15 reps;Seated Goniometric ROM: 4-69   PT Diagnosis:    PT Problem List:   PT Treatment Interventions:     PT Goals (current goals can now be  found in the care plan section) Acute Rehab PT Goals Patient Stated Goal: rehab to then go home  Visit Information  Last PT Received On: 08/31/12 Assistance Needed: +1 History of Present Illness: Pt admitted for R TKA    Subjective Data  Subjective: I know this (exercise ) has to be done but it's the hardest part Patient Stated Goal: rehab to then go home   Cognition  Cognition Arousal/Alertness: Awake/alert Behavior During Therapy: [facility] for tasks assessed/performed Overall Cognitive Status: Within Functional Limits for tasks assessed    Balance     End of Session PT - End of Session Equipment Utilized During Treatment: Gait belt Activity Tolerance: Patient limited by pain Patient left: in chair;with call bell/phone within reach Nurse Communication: Mobility status   GP     Ferman Hamming 08/31/2012, 11:45 AM Weldon Picking PT Acute Rehab Services [phone] [place] [phone]

## 2012-08-31 NOTE — Progress Notes (Signed)
Clinical social worker assisted with patient discharge to skilled nursing facility,[facility].  CSW addressed all family questions and concerns. CSW copied chart and added all important documents. Clinical Social Worker will sign off for now as social work intervention is no longer needed.   Sabino Niemann, MSW,  [phone]

## 2012-08-31 NOTE — Evaluation (Signed)
Occupational Therapy Evaluation Patient Details Name: ANU STAGNER MRN: [id number] DOB: [birth year] Today's Date: 08/31/2012 Time: 6578-4696 OT Time Calculation (min): 21 min  OT Assessment / Plan / Recommendation History of present illness Pt admitted for R TKA   Clinical Impression   Pt demos decline in function with ADLs and ADL mobility. Pt to d/c to SNF for further rehab before returning home. All education completed and no further acute OT services indicated at this time    OT Assessment  All further OT needs can be met in the next venue of care    Follow Up Recommendations  SNF    Barriers to Discharge  none    Equipment Recommendations  Other (comment) (TBD at SNF)    Recommendations for Other Services    Frequency       Precautions / Restrictions Precautions Precautions: Knee Restrictions Weight Bearing Restrictions: Yes RLE Weight Bearing: Partial weight bearing RLE Partial Weight Bearing Percentage or Pounds: 50   Pertinent Vitals/Pain 3/10 R knee    ADL  Grooming: Wash/dry hands Lower Body Dressing: Performed;Minimal assistance Where Assessed - Lower Body Dressing: Supported sitting Toilet Transfer: Performed;Supervision/safety;Min guard Statistician Method: Sit to Barista: Raised toilet seat with arms (or 3-in-1 over toilet);Grab bars Toileting - Clothing Manipulation and Hygiene: Performed;Min guard;Supervision/safety Where Assessed - Engineer, mining and Hygiene: Standing Equipment Used: Reacher;Gait belt;Rolling walker;Sock aid    OT Diagnosis:    OT Problem List: Decreased strength;Decreased knowledge of use of DME or AE;Decreased activity tolerance;Impaired balance (sitting and/or standing);Pain OT Treatment Interventions:     OT Goals(Current goals can be found in the care plan section) Acute Rehab OT Goals Patient Stated Goal: rehab to then go home  Visit Information  Last OT Received On:  08/31/12 Assistance Needed: +1 History of Present Illness: Pt admitted for R TKA       Prior Functioning               Vision/Perception     Cognition  Cognition Arousal/Alertness: Awake/alert Behavior During Therapy: WFL for tasks assessed/performed Overall Cognitive Status: Within Functional Limits for tasks assessed    Extremity/Trunk Assessment       Mobility Bed Mobility Bed Mobility: Not assessed Transfers Sit to Stand: 5: Supervision;From chair/3-in-1;From toilet Stand to Sit: 5: Supervision;To toilet;To chair/3-in-1        Balance Balance Balance Assessed: Yes Dynamic Standing Balance Dynamic Standing - Balance Support: No upper extremity supported;During functional activity Dynamic Standing - Level of Assistance: 5: Stand by assistance   End of Session OT - End of Session Equipment Utilized During Treatment: Gait belt;Rolling walker;Other (comment) (3 in 1, ADL A/E) Activity Tolerance: Patient tolerated treatment well Patient left: in chair;with call bell/phone within reach CPM Right Knee CPM Right Knee: Off  GO     Margaretmary Eddy [facility] 08/31/2012, 3:16 PM

## 2012-08-31 NOTE — Progress Notes (Signed)
Physical Therapy Treatment Patient Details Name: Diane Daugherty MRN: [id number] DOB: 1940/03/19 Today's Date: 08/31/2012 Time: 4098-1191 PT Time Calculation (min): 29 min  PT Assessment / Plan / Recommendation  History of Present Illness Pt admitted for R TKA   PT Comments   Pt. Making good progress with mobility.  Somewhat fatigued following bath and mobility training with PT.  Will allow her to rest then follow up with exercises.    Follow Up Recommendations  SNF     Does the patient have the potential to tolerate intense rehabilitation     Barriers to Discharge        Equipment Recommendations  None recommended by PT    Recommendations for Other Services    Frequency 7X/week   Progress towards PT Goals Progress towards PT goals: Progressing toward goals  Plan Current plan remains appropriate    Precautions / Restrictions Precautions Precautions: Knee Restrictions Weight Bearing Restrictions: Yes RLE Weight Bearing: Partial weight bearing RLE Partial Weight Bearing Percentage or Pounds: 50   Pertinent Vitals/Pain See vitals tab     Mobility  Bed Mobility Bed Mobility: Not assessed (pt. in recliner) Transfers Transfers: Sit to Stand;Stand to Sit Sit to Stand: 5: Supervision;From chair/3-in-1;From toilet Stand to Sit: 5: Supervision;To toilet;To chair/3-in-1 Details for Transfer Assistance: reminder of R LE placement for comfort in transfer Ambulation/Gait Ambulation/Gait Assistance: 4: Min guard Ambulation Distance (Feet): 80 Feet Assistive device: Rolling walker Ambulation/Gait Assistance Details: cues for equalizing stride for more normal gait pattern Gait Pattern: Step-to pattern;Step-through pattern Gait velocity: decreased Stairs: No    Exercises     PT Diagnosis:    PT Problem List:   PT Treatment Interventions:     PT Goals (current goals can now be found in the care plan section)    Visit Information  Last PT Received On: 08/31/12 Assistance  Needed: +1 History of Present Illness: Pt admitted for R TKA    Subjective Data  Subjective: I am going to [employer] today because I live alone   Cognition  Cognition Arousal/Alertness: Awake/alert Behavior During Therapy: WFL for tasks assessed/performed Overall Cognitive Status: Within Functional Limits for tasks assessed    Balance     End of Session PT - End of Session Equipment Utilized During Treatment: Gait belt Activity Tolerance: Patient tolerated treatment well Patient left: in chair;with call bell/phone within reach Nurse Communication: Mobility status CPM Right Knee CPM Right Knee: Off   GP     Ferman Hamming 08/31/2012, 10:19 AM Weldon Picking PT Acute Rehab Services [phone] [place] [phone]

## 2012-08-31 NOTE — Progress Notes (Signed)
Discharge instructions reviewed with patient.  Reviewed new/changed/discontinued medications. Patient voices understanding to teaching. Client to door via wheelchair. Transported to rehab by her daughter.

## 2012-09-03 ENCOUNTER — Non-Acute Institutional Stay (SKILLED_NURSING_FACILITY): Payer: Medicare Other | Admitting: Internal Medicine

## 2012-09-03 DIAGNOSIS — M1710 Unilateral primary osteoarthritis, unspecified knee: Secondary | ICD-10-CM

## 2012-09-03 DIAGNOSIS — I10 Essential (primary) hypertension: Secondary | ICD-10-CM

## 2012-09-03 DIAGNOSIS — J45909 Unspecified asthma, uncomplicated: Secondary | ICD-10-CM

## 2012-09-03 DIAGNOSIS — M1711 Unilateral primary osteoarthritis, right knee: Secondary | ICD-10-CM

## 2012-09-03 DIAGNOSIS — D62 Acute posthemorrhagic anemia: Secondary | ICD-10-CM

## 2012-09-26 DIAGNOSIS — D62 Acute posthemorrhagic anemia: Secondary | ICD-10-CM | POA: Insufficient documentation

## 2012-09-26 NOTE — Progress Notes (Signed)
Patient ID: Diane Daugherty, female   DOB: 10/14/1940, 72 y.o.   MRN: [id number]        HISTORY & PHYSICAL  DATE: 09/03/2012   FACILITY: [facility]  LEVEL OF CARE: SNF (31)  ALLERGIES:  Allergies  Allergen Reactions  . Levaquin [Levofloxacin] Swelling  . Morphine And Related     nausea  . Oxycodone Nausea Only    CHIEF COMPLAINT:  Manage right knee osteoarthritis, asthma, and hypertension.    HISTORY OF PRESENT ILLNESS:  The patient is a 72 year-old, African-American female.   KNEE OSTEOARTHRITIS: Patient had a history of pain and functional disability in the knee due to end-stage osteoarthritis and has failed nonsurgical conservative treatments. Patient had worsening of pain with activity and weight bearing, pain that interfered with activities of daily living & pain with passive range of motion. Therefore patient underwent total knee arthroplasty and tolerated the procedure well. Patient is admitted to this facility for sort short-term rehabilitation. Patient denies knee pain.   HTN: Pt 's HTN remains stable.  Denies CP, sob, DOE, pedal edema, headaches, dizziness or visual disturbances.  No complications from the medications currently being used.  Last BP : 106/59, 102/55, 120/63.     ASTHMA: The patient's asthma remains stable. Patient denies shortness of breath, dyspnea on exertion or wheezing. No complications reported from the medications currently being used.   PAST MEDICAL HISTORY :  Past Medical History  Diagnosis Date  . Hypertension     takes Hyzaar daily  . Muscle spasms of lower extremity     takes Baclofen daily prn  . PONV (postoperative nausea and vomiting)     woke up during hysterectomy  . Family history of anesthesia complication     daughter gets sick  . Peripheral vascular disease   . Sleep apnea     sleep study to be requested [facility];uses CPAP  . Pneumonia     hx of;several times but none since 2011  . History of bronchitis   .  Shortness of breath     with exertion;Albuterol prn  . Carpal tunnel syndrome   . Arthritis   . Joint pain   . Joint swelling   . Urinary frequency   . Nocturia   . History of blood transfusion     no abnormal reaction noted  . Cataract     immature on right  . PFO (patent foramen ovale)     positive saline bubble study 05/12/09 [facility]    PAST SURGICAL HISTORY: Past Surgical History  Procedure Laterality Date  . Cesarean section      x 3  . Abdominal hysterectomy    . Right leg surgery with screw placed    . Breast surgery      breast biopsy  . Back surgery      x 3  . Knee arthroscopy Right   . Appendectomy    . Colonoscopy    . Chest tube insertion  80's    d/t pneumothorax  . Total knee arthroplasty Right 08/28/2012    Procedure: TOTAL KNEE ARTHROPLASTY;  Surgeon: Valeria Batman, MD;  Location: [facility] OR;  Service: Orthopedics;  Laterality: Right;    SOCIAL HISTORY:  reports that she has never smoked. She does not have any smokeless tobacco history on file. She reports that she does not drink alcohol or use illicit drugs.  FAMILY HISTORY: None  CURRENT MEDICATIONS: Reviewed per [facility]  REVIEW OF SYSTEMS:  See  HPI otherwise 14 point ROS is negative.  PHYSICAL EXAMINATION  VS:  T 97.8       P 78      RR 20      BP 106/59      POX%        WT (Lb)  GENERAL: no acute distress, morbidly obese body habitus SKIN: warm & dry, no suspicious lesions or rashes, no excessive dryness, right knee incision clean and dry, staples are in place   EYES: conjunctivae normal, sclerae normal, normal eye lids MOUTH/THROAT: lips without lesions,no lesions in the mouth,tongue is without lesions,uvula elevates in midline NECK: supple, trachea midline, no neck masses, no thyroid tenderness, no thyromegaly LYMPHATICS: no LAN in the neck, no supraclavicular LAN RESPIRATORY: breathing is even & unlabored, BS CTAB CARDIAC: RRR, no murmur,no extra heart sounds EDEMA/VARICOSITIES:  +1  bilateral lower extremity edema  ARTERIAL:  pedal pulses nonpalpable   GI:  ABDOMEN: abdomen soft, normal BS, no masses, no tenderness  LIVER/SPLEEN: no hepatomegaly, no splenomegaly MUSCULOSKELETAL: HEAD: normal to inspection & palpation BACK: no kyphosis, scoliosis or spinal processes tenderness EXTREMITIES: LEFT UPPER EXTREMITY: full range of motion, normal strength & tone RIGHT UPPER EXTREMITY:  full range of motion, normal strength & tone LEFT LOWER EXTREMITY: strength intact, range of motion moderate   RIGHT LOWER EXTREMITY: strength intact, range of motion not tested due to surgery   PSYCHIATRIC: the patient is alert & oriented to person, affect & behavior appropriate  LABS/RADIOLOGY: Hemoglobin 8.2, otherwise CBC normal.    Glucose 116, otherwise BMP normal.    INR 1.07.    Chest x-ray:  No acute disease.    MRI of the right knee in May 2013 showed moderate medial greater than lateral compartment degenerative changes with chondral thinning and osteophytes.  Medial meniscus was extruded from the joint and the posterior bone and body were diminutive with diffuse irregularity but nothing centrally displaced, meniscal fragment.    ASSESSMENT/PLAN:  Right knee osteoarthritis.  Status post right total knee arthroplasty.  Continue rehabilitation.    Asthma.  Well compensated.    Hypertension.  Well controlled.    Constipation.  New problem.  Senna was started.    Acute blood loss anemia.  Recheck.    Check CBC and BMP.    I have reviewed patient's medical records received at admission/from hospitalization.  CPT CODE: 16109

## 2012-09-27 ENCOUNTER — Encounter: Payer: Self-pay | Admitting: Adult Health

## 2012-09-27 DIAGNOSIS — D62 Acute posthemorrhagic anemia: Secondary | ICD-10-CM

## 2012-09-27 DIAGNOSIS — J45909 Unspecified asthma, uncomplicated: Secondary | ICD-10-CM

## 2012-09-27 DIAGNOSIS — I10 Essential (primary) hypertension: Secondary | ICD-10-CM

## 2012-09-27 DIAGNOSIS — K5900 Constipation, unspecified: Secondary | ICD-10-CM

## 2012-09-27 DIAGNOSIS — M1711 Unilateral primary osteoarthritis, right knee: Secondary | ICD-10-CM

## 2012-10-01 DIAGNOSIS — R262 Difficulty in walking, not elsewhere classified: Secondary | ICD-10-CM

## 2012-10-01 DIAGNOSIS — E669 Obesity, unspecified: Secondary | ICD-10-CM

## 2012-10-01 DIAGNOSIS — I10 Essential (primary) hypertension: Secondary | ICD-10-CM

## 2012-10-01 DIAGNOSIS — Z471 Aftercare following joint replacement surgery: Secondary | ICD-10-CM

## 2012-10-04 ENCOUNTER — Encounter: Payer: Self-pay | Admitting: Adult Health

## 2012-10-04 DIAGNOSIS — K5900 Constipation, unspecified: Secondary | ICD-10-CM | POA: Insufficient documentation

## 2012-10-04 NOTE — Progress Notes (Signed)
Patient ID: Diane Daugherty, female   DOB: 1940-04-19, 72 y.o.   MRN: [id number]       PROGRESS NOTE  DATE: 09/27/2012   FACILITY: [facility]  LEVEL OF CARE: SNF (31)  Discharge Visit  CHIEF COMPLAINT:    HISTORY OF PRESENT ILLNESS: I was requested by the social worker to perform face-to-face evaluation for discharge:  Patient was admitted to this facility for short-term rehabilitation after the patient's recent hospitalization.  Patient has completed SNF rehabilitation and therapy has cleared the patient for discharge.  Reassessment of ongoing problem(s):  PAST MEDICAL HISTORY : Reviewed.  No changes.  CURRENT MEDICATIONS: Reviewed per [facility]  REVIEW OF SYSTEMS:  GENERAL: no change in appetite, no fatigue, no weight changes, no fever, chills or weakness RESPIRATORY: no cough, SOB, DOE, wheezing, hemoptysis CARDIAC: no chest pain, edema or palpitations GI: no abdominal pain, diarrhea, constipation, heart burn, nausea or vomiting  PHYSICAL EXAMINATION  VS:  T       P       RR      BP      POX %       WT (Lb)  GENERAL: no acute distress, normal body habitus EYES: conjunctivae normal, sclerae normal, normal eye lids NECK: supple, trachea midline, no neck masses, no thyroid tenderness, no thyromegaly LYMPHATICS: no LAN in the neck, no supraclavicular LAN RESPIRATORY: breathing is even & unlabored, BS CTAB CARDIAC: RRR, no murmur,no extra heart sounds, no edema GI: abdomen soft, normal BS, no masses, no tenderness, no hepatomegaly, no splenomegaly PSYCHIATRIC: the patient is alert & oriented to person, affect & behavior appropriate  LABS/RADIOLOGY:  ASSESSMENT/PLAN:  I have filled out patient's discharge paperwork and written prescriptions.  Patient will receive home health PT and Nursing DME provided: Rolling walker  Total discharge time: Greater than 30 minutes Discharge time involved coordination of the discharge process with social worker, nursing staff  and therapy department. Medical justification for home health services/DME verified.  CPT CODE: 9931    This encounter was created in error - please disregard.

## 2012-12-23 ENCOUNTER — Other Ambulatory Visit: Payer: Self-pay | Admitting: Adult Health

## 2014-09-25 ENCOUNTER — Encounter (HOSPITAL_COMMUNITY): Payer: Self-pay

## 2014-09-25 ENCOUNTER — Encounter (HOSPITAL_COMMUNITY)
Admission: RE | Admit: 2014-09-25 | Discharge: 2014-09-25 | Disposition: A | Discharge status: Home / Self Care | Payer: Medicare Other | Source: Ambulatory Visit | Attending: Orthopedic Surgery | Admitting: Orthopedic Surgery

## 2014-09-25 ENCOUNTER — Encounter (HOSPITAL_COMMUNITY)
Admission: RE | Admit: 2014-09-25 | Discharge: 2014-09-25 | Disposition: A | Discharge status: Home / Self Care | Payer: Medicare Other | Source: Ambulatory Visit | Attending: Orthopaedic Surgery | Admitting: Orthopaedic Surgery

## 2014-09-25 ENCOUNTER — Other Ambulatory Visit: Payer: Self-pay

## 2014-09-25 DIAGNOSIS — Z79899 Other long term (current) drug therapy: Secondary | ICD-10-CM | POA: Diagnosis not present

## 2014-09-25 DIAGNOSIS — M1712 Unilateral primary osteoarthritis, left knee: Secondary | ICD-10-CM | POA: Insufficient documentation

## 2014-09-25 DIAGNOSIS — I739 Peripheral vascular disease, unspecified: Secondary | ICD-10-CM | POA: Insufficient documentation

## 2014-09-25 DIAGNOSIS — Z01812 Encounter for preprocedural laboratory examination: Secondary | ICD-10-CM | POA: Insufficient documentation

## 2014-09-25 DIAGNOSIS — Z96651 Presence of right artificial knee joint: Secondary | ICD-10-CM | POA: Insufficient documentation

## 2014-09-25 DIAGNOSIS — I10 Essential (primary) hypertension: Secondary | ICD-10-CM | POA: Diagnosis not present

## 2014-09-25 DIAGNOSIS — I517 Cardiomegaly: Secondary | ICD-10-CM | POA: Insufficient documentation

## 2014-09-25 DIAGNOSIS — G4733 Obstructive sleep apnea (adult) (pediatric): Secondary | ICD-10-CM | POA: Diagnosis not present

## 2014-09-25 DIAGNOSIS — Z0183 Encounter for blood typing: Secondary | ICD-10-CM | POA: Diagnosis not present

## 2014-09-25 DIAGNOSIS — Z01818 Encounter for other preprocedural examination: Secondary | ICD-10-CM | POA: Diagnosis present

## 2014-09-25 DIAGNOSIS — Q211 Atrial septal defect: Secondary | ICD-10-CM | POA: Insufficient documentation

## 2014-09-25 LAB — URINALYSIS, ROUTINE W REFLEX MICROSCOPIC
Bilirubin Urine: NEGATIVE
GLUCOSE, UA: NEGATIVE mg/dL
Hgb urine dipstick: NEGATIVE
KETONES UR: NEGATIVE mg/dL
NITRITE: NEGATIVE
PROTEIN: NEGATIVE mg/dL
Specific Gravity, Urine: 1.012 (ref 1.005–1.030)
Urobilinogen, UA: 0.2 mg/dL (ref 0.0–1.0)
pH: 5.5 (ref 5.0–8.0)

## 2014-09-25 LAB — COMPREHENSIVE METABOLIC PANEL
ALK PHOS: 69 U/L (ref 38–126)
ALT: 13 U/L — AB (ref 14–54)
AST: 17 U/L (ref 15–41)
Albumin: 3.6 g/dL (ref 3.5–5.0)
Anion gap: 7 (ref 5–15)
BUN: 21 mg/dL — AB (ref 6–20)
CALCIUM: 9.4 mg/dL (ref 8.9–10.3)
CO2: 27 mmol/L (ref 22–32)
CREATININE: 1.37 mg/dL — AB (ref 0.44–1.00)
Chloride: 103 mmol/L (ref 101–111)
GFR, EST AFRICAN AMERICAN: 43 mL/min — AB (ref >60)
GFR, EST NON AFRICAN AMERICAN: 38 mL/min — AB (ref >60)
Glucose, Bld: 101 mg/dL — ABNORMAL HIGH (ref 65–99)
Potassium: 3.8 mmol/L (ref 3.5–5.1)
Sodium: 137 mmol/L (ref 135–145)
Total Bilirubin: 0.5 mg/dL (ref 0.3–1.2)
Total Protein: 7.8 g/dL (ref 6.5–8.1)

## 2014-09-25 LAB — CBC WITH DIFFERENTIAL/PLATELET
BASOS ABS: 0 10*3/uL (ref 0.0–0.1)
Basophils Relative: 0 % (ref 0–1)
Eosinophils Absolute: 0.2 10*3/uL (ref 0.0–0.7)
Eosinophils Relative: 2 % (ref 0–5)
HCT: 32.8 % — ABNORMAL LOW (ref 36.0–46.0)
HEMOGLOBIN: 10.9 g/dL — AB (ref 12.0–15.0)
LYMPHS ABS: 2.4 10*3/uL (ref 0.7–4.0)
LYMPHS PCT: 32 % (ref 12–46)
MCH: 31 pg (ref 26.0–34.0)
MCHC: 33.2 g/dL (ref 30.0–36.0)
MCV: 93.2 fL (ref 78.0–100.0)
Monocytes Absolute: 0.3 10*3/uL (ref 0.1–1.0)
Monocytes Relative: 4 % (ref 3–12)
NEUTROS ABS: 4.5 10*3/uL (ref 1.7–7.7)
NEUTROS PCT: 62 % (ref 43–77)
Platelets: 252 10*3/uL (ref 150–400)
RBC: 3.52 MIL/uL — AB (ref 3.87–5.11)
RDW: 14.6 % (ref 11.5–15.5)
WBC: 7.3 10*3/uL (ref 4.0–10.5)

## 2014-09-25 LAB — PROTIME-INR
INR: 1.13 (ref 0.00–1.49)
PROTHROMBIN TIME: 14.7 s (ref 11.6–15.2)

## 2014-09-25 LAB — SURGICAL PCR SCREEN
MRSA, PCR: NEGATIVE
Staphylococcus aureus: NEGATIVE

## 2014-09-25 LAB — URINE MICROSCOPIC-ADD ON

## 2014-09-25 LAB — TYPE AND SCREEN
ABO/RH(D): A POS
Antibody Screen: NEGATIVE

## 2014-09-25 LAB — APTT: aPTT: 26 seconds (ref 24–37)

## 2014-09-25 MED ORDER — CHLORHEXIDINE GLUCONATE 4 % EX LIQD: 60.0000 mL | Freq: Once | CUTANEOUS | Status: DC

## 2014-09-25 NOTE — Pre-Procedure Instructions (Signed)
Diane Daugherty  09/25/2014      [facility] - [place], [place] - [address] [place] [address] Phone: [phone] Fax: [phone]    Your procedure is scheduled on Tues, Sept 13 @ 10:00 AM  Report to [facility] Admitting at 8:00 AM  Call this number if you have problems the morning of surgery:  [phone]   Remember:  Do not eat food or drink liquids after midnight.  Take these medicines the morning of surgery with A SIP OF WATER Albuterol<Bring Your Inhaler With You>,Baclofen(Lioresal),and Flonase(Fluticasone)              Stop taking your Krill Oil a week before surgery. Also no Goody's,BC's,Aleve,Aspirin,Ibuprofen,Fish Oil,or any Herbal Medications.    Do not wear jewelry, make-up or nail polish.  Do not wear lotions, powders, or perfumes.  You may wear deodorant.  Do not shave 48 hours prior to surgery.    Do not bring valuables to the hospital.  [facility] is not responsible for any belongings or valuables.  Contacts, dentures or bridgework may not be worn into surgery.  Leave your suitcase in the car.  After surgery it may be brought to your room.  For patients admitted to the hospital, discharge time will be determined by your treatment team.  Patients discharged the day of surgery will not be allowed to drive home.    Special instructions:  [place] - Preparing for Surgery  Before surgery, you can play an important role.  Because skin is not sterile, your skin needs to be as free of germs as possible.  You can reduce the number of germs on you skin by washing with CHG (chlorahexidine gluconate) soap before surgery.  CHG is an antiseptic cleaner which kills germs and bonds with the skin to continue killing germs even after washing.  Please DO NOT use if you have an allergy to CHG or antibacterial soaps.  If your skin becomes reddened/irritated stop using the CHG and inform your nurse when you arrive at Short  Stay.  Do not shave (including legs and underarms) for at least 48 hours prior to the first CHG shower.  You may shave your face.  Please follow these instructions carefully:   1.  Shower with CHG Soap the night before surgery and the                                morning of Surgery.  2.  If you choose to wash your hair, wash your hair first as usual with your       normal shampoo.  3.  After you shampoo, rinse your hair and body thoroughly to remove the                      Shampoo.  4.  Use CHG as you would any other liquid soap.  You can apply chg directly       to the skin and wash gently with scrungie or a clean washcloth.  5.  Apply the CHG Soap to your body ONLY FROM THE NECK DOWN.        Do not use on open wounds or open sores.  Avoid contact with your eyes,       ears, mouth and genitals (private parts).  Wash genitals (private parts)       with  your normal soap.  6.  Wash thoroughly, paying special attention to the area where your surgery        will be performed.  7.  Thoroughly rinse your body with warm water from the neck down.  8.  DO NOT shower/wash with your normal soap after using and rinsing off       the CHG Soap.  9.  Pat yourself dry with a clean towel.            10.  Wear clean pajamas.            11.  Place clean sheets on your bed the night of your first shower and do not        sleep with pets.  Day of Surgery  Do not apply any lotions/deoderants the morning of surgery.  Please wear clean clothes to the hospital/surgery center.    Please read over the following fact sheets that you were given. Pain Booklet, Coughing and Deep Breathing, Blood Transfusion Information, MRSA Information and Surgical Site Infection Prevention

## 2014-09-25 NOTE — H&P (Addendum)
CHIEF COMPLAINT:  Painful left knee.   HISTORY OF PRESENT ILLNESS:  Diane Daugherty is a very pleasant 74 year old African American female who is seen today for evaluation of her left knee.  She has been seen several times for her left knee which has shown degenerative changes for some time now.  She has had corticosteroid injections to the left knee as well as Euflexxa injections.  She has had benefit from her last set of injections in June and July of 2015.  She did have another corticosteroid injection in January 2016 which was only somewhat beneficial.  Her symptoms have worsened to the point now where she is having pain with every step.  She has nighttime pain also.  Her activities of daily living are very difficult because of her left knee pain.  At times she has to use a cane intermittently.  Her symptoms have gotten to the point where it is so painful that she would like to consider a total knee replacement.   PAST MEDICAL HISTORY:  Her general health is good.  Hospitalizations:  In 1962, 1963, 1970 cesarean sections. In 1990 double hysterectomy. In 2003, 2005, 2012 lumbar spine surgery.  In 1989 fracture of the leg.  In 2014 for a right total knee arthroplasty.  In 2000 and 2010 pneumonia.       MEDICATIONS:  Ventolin FHA, albuterol inhaler p.r.n., losartan/hydrochlorothiazide 100/25 daily, fluticasone 15 mcg spray 2 sprays each nostril daily, baclofen 20 mg p.r.n., Tylenol Eight Hour Arthritis Strength 2 in the morning, calcium with vitamin D 600 daily, Ocuvite iron multivitamin daily, Omega Red, Omega Krill 300 mg daily, Centrum Silver daily.   ALLERGIES:  Percocet, codeine, Vicodin, Darvocet, Demerol and morphine.  These all give her GI upset.  She is able to tolerate Dilaudid (hydromorphone).  Levaquin does cause swelling about the face and throat.     REVIEW OF SYSTEMS:  Fourteen point review of systems is positive for shortness of breath for which she does use albuterol.  She uses it about once every 2  months.  She has had a history of hypertension for 30 years and she is on medication for this.  It appears controlled.  She does have nocturia every 2 hours at nighttime but she feels that this is based on her drinking water at nighttime.  She states that she is a water abuser.  She does have a history of sleep apnea and she does use a CPAP machine.    FAMILY HISTORY:  Positive for mother who died at a very young age in a motor vehicle accident.  Her father was never part of her life and she does not know about when he died.  She has no brothers.  She has a living sister who is healthy at 17.    SOCIAL HISTORY:  She is a 74 year old [place] American female who is divorced.  She is retired from [employer]  She denies any use of tobacco or alcohol.    PHYSICAL EXAMINATION:  Reveals a very pleasant 74 year old [place] American female.  She is well developed, well nourished.  Alert, pleasant and cooperative.  She is in moderate distress secondary to left knee pain.  She is 5 feet 1 inch and weighs 230 pounds with a BMI of 43.5.  Vital signs reveal a temperature of 97.7.  Pulse is 68.  Respirations 16.  Blood pressure 142/90.  Head is normocephalic.  Eyes:  Pupils are equal, round and reactive to light and accommodation.  Extraocular movements  are intact.  Ears, nose and throat a benign.  Neck is supple.  No carotid bruits are noted.  Chest has good expansion.  Lungs are essentially clear.  Cardiac:  Regular rhythm and rate with distant heart sounds.  No murmurs are appreciated.  Pulses are 1+ bilaterally and symmetric in the lower extremities.  The abdomen is obese, soft and nontender.  There are no masses palpable.  Normal bowel sounds are present.  CNS:  Oriented x3 and cranial nerves II-XII are grossly intact.  Genital, rectal and breast exam not indicated for orthopedic evaluation.  Musculoskeletal:  She has range of motion from 5-100 degrees.  She does have crepitus with range of motion, more at the  patellofemoral area.  She has good ligamentous stability although she does have some pseudolaxity with varus and valgus stressing.  The calf is supple and nontender but she does have some varicosities along the medial and proximal border of the calf.        RADIOGRAPHS:  Reveal near bone-on-bone medial compartment OA with periarticular spurring more medial than lateral.  She has mild translation of the distal femur medially on the tibial plateau.  She also has patellofemoral degenerative changes and spurring noted both on the lateral and the sunrise views.   CLINICAL IMPRESSION:   1.  Endstage OA of the left knee.   2.  History of hypertension. 3.  History of sleep apnea. 4.  Occasional pulmonary congestion and she uses albuterol.   RECOMMENDATIONS:  At this time we feel that she would be a candidate for a left total knee replacement.  The procedure, the risks and benefits were fully explained to her and she is understanding.  Appropriate models were used to illustrate the procedure.  I reviewed a note from Dr. Scotty Court which reveals that he feels that she is cleared from a medical and a cardiac standpoint for a total knee replacement.  We will need to continue her blood pressure medicine and also the CPAP machine for her sleep apnea.    Mike Craze [place], [place] [phone]  10/06/2014 4:23 PM

## 2014-09-25 NOTE — Progress Notes (Addendum)
Cardiologist denies having one  Medical Md is Dr.David Tapper in [place]  Echo done in 2011 report under media tab   Stress test done > 57yrs ago  Heart cath denies ever having one  EKG denies having one in past yr  CXR denies having one in past yr

## 2014-09-26 LAB — URINE CULTURE: Culture: 5000

## 2014-09-26 NOTE — Progress Notes (Signed)
Anesthesia Chart Review:  Pt is 74 year old female scheduled for L total knee arthroplasty on 10/07/2014 with Dr. Durward Fortes.   PMH includes: HTN, PFO, PVD, OSA (Sleep study can be found in file labeled correspondence in media tab dated 09/02/12).  Never smoker. BMI 43. S/p R TKA 08/28/12.   Anesthesia history includes pt "woke up" during hysterectomy. No complications noted on prior knee replacement 2014.   Medications include: albuterol, losartan-hctz.   Preoperative labs reviewed.    Chest x-ray 09/25/2014 reviewed. Stable cardiomegaly. No active lung disease.   EKG 09/25/2014: Sinus rhythm with marked sinus arrhythmia. Possible Anterior infarct, age undetermined. No significant change since last tracing 08/22/2012 per Dr. Jacalyn Lefevre interpretation.   Echo on 03/20/09 showed [facility]) showed normal LV systolic function, EF 68-11%, mildly dilated LA, possible PFO, no evidence of AR, trace MR, mild TR, RVSP calculated at 45 mmHg. She later had a positive agitated saline contrast bubble study on 05/12/09 confirming a PFO with predominant right to left shunting. (Ordered by Dr. Scotty Court, but read by Dr. Dannielle Burn.) There is no documented history of CVA. (Bubble study can be found in correspondence dated 09/02/12 under media tab; echo results by notes from Myra Gianotti, [place] in [place] note  dated 08/23/12).    She saw cardiologist Dr. Domenic Polite on 09/19/07 for evaluation of chest tightness and EKG showing poor r wave progressive. He ordered a stress echo which was done on 09/26/07 and was negative.  She has a known PFO without CVA history. She has tolerated multiple surgical procedures in the past. If no acute changes then I would anticipate that she could proceed as planned.  Willeen Cass, FNP-BC [facility]/Anesthesiology Phone: [phone] 09/26/2014 4:49 PM

## 2014-10-07 ENCOUNTER — Inpatient Hospital Stay (HOSPITAL_COMMUNITY): Payer: Medicare Other | Admitting: Emergency Medicine

## 2014-10-07 ENCOUNTER — Inpatient Hospital Stay (HOSPITAL_COMMUNITY): Payer: Medicare Other | Admitting: Anesthesiology

## 2014-10-07 ENCOUNTER — Inpatient Hospital Stay (HOSPITAL_COMMUNITY)
Admission: RE | Admit: 2014-10-07 | Discharge: 2014-10-10 | DRG: 470 | Disposition: A | Discharge status: Skilled Nursing Facility | Payer: Medicare Other | Source: Ambulatory Visit | Attending: Orthopaedic Surgery | Admitting: Orthopaedic Surgery

## 2014-10-07 ENCOUNTER — Encounter (HOSPITAL_COMMUNITY)
Admission: RE | Disposition: A | Discharge status: Skilled Nursing Facility | Payer: Self-pay | Source: Ambulatory Visit | Attending: Orthopaedic Surgery

## 2014-10-07 ENCOUNTER — Encounter (HOSPITAL_COMMUNITY): Payer: Self-pay | Admitting: *Deleted

## 2014-10-07 DIAGNOSIS — Z6841 Body Mass Index (BMI) 40.0 and over, adult: Secondary | ICD-10-CM

## 2014-10-07 DIAGNOSIS — I10 Essential (primary) hypertension: Secondary | ICD-10-CM | POA: Diagnosis not present

## 2014-10-07 DIAGNOSIS — Z96652 Presence of left artificial knee joint: Secondary | ICD-10-CM

## 2014-10-07 DIAGNOSIS — G4730 Sleep apnea, unspecified: Secondary | ICD-10-CM | POA: Diagnosis present

## 2014-10-07 DIAGNOSIS — I739 Peripheral vascular disease, unspecified: Secondary | ICD-10-CM | POA: Diagnosis present

## 2014-10-07 DIAGNOSIS — M1712 Unilateral primary osteoarthritis, left knee: Secondary | ICD-10-CM | POA: Diagnosis not present

## 2014-10-07 DIAGNOSIS — D62 Acute posthemorrhagic anemia: Secondary | ICD-10-CM | POA: Diagnosis not present

## 2014-10-07 DIAGNOSIS — G4733 Obstructive sleep apnea (adult) (pediatric): Secondary | ICD-10-CM | POA: Diagnosis present

## 2014-10-07 HISTORY — PX: TOTAL KNEE ARTHROPLASTY: SHX125

## 2014-10-07 HISTORY — DX: Unspecified chronic bronchitis: J42

## 2014-10-07 HISTORY — DX: Obstructive sleep apnea (adult) (pediatric): G47.33

## 2014-10-07 HISTORY — DX: Anemia, unspecified: D64.9

## 2014-10-07 HISTORY — DX: Dependence on other enabling machines and devices: Z99.89

## 2014-10-07 LAB — GLUCOSE, CAPILLARY: Glucose-Capillary: 115 mg/dL — ABNORMAL HIGH (ref 65–99)

## 2014-10-07 SURGERY — ARTHROPLASTY, KNEE, TOTAL
Anesthesia: General | Site: Knee | Laterality: Left

## 2014-10-07 MED ORDER — HYDROMORPHONE HCL 2 MG PO TABS
ORAL_TABLET | ORAL | Status: AC
  Filled 2014-10-07: qty 1

## 2014-10-07 MED ORDER — BUPIVACAINE-EPINEPHRINE (PF) 0.25% -1:200000 IJ SOLN
INTRAMUSCULAR | Status: AC
  Filled 2014-10-07: qty 30

## 2014-10-07 MED ORDER — ONDANSETRON HCL 4 MG PO TABS: 4.0000 mg | ORAL_TABLET | Freq: Four times a day (QID) | ORAL | Status: DC | PRN

## 2014-10-07 MED ORDER — PHENOL 1.4 % MT LIQD: 1.0000 | OROMUCOSAL | Status: DC | PRN

## 2014-10-07 MED ORDER — LOSARTAN POTASSIUM-HCTZ 100-25 MG PO TABS: 1.0000 | ORAL_TABLET | Freq: Every day | ORAL | Status: DC

## 2014-10-07 MED ORDER — LACTATED RINGERS IV SOLN
INTRAVENOUS | Status: DC | PRN
  Administered 2014-10-07 (×2): via INTRAVENOUS

## 2014-10-07 MED ORDER — DEXAMETHASONE SODIUM PHOSPHATE 10 MG/ML IJ SOLN
INTRAMUSCULAR | Status: DC | PRN
  Administered 2014-10-07: 10 mg via INTRAVENOUS

## 2014-10-07 MED ORDER — ALBUTEROL SULFATE (2.5 MG/3ML) 0.083% IN NEBU: 3.0000 mL | INHALATION_SOLUTION | Freq: Four times a day (QID) | RESPIRATORY_TRACT | Status: DC | PRN

## 2014-10-07 MED ORDER — POLYETHYLENE GLYCOL 3350 17 G PO PACK: 17.0000 g | PACK | Freq: Every day | ORAL | Status: DC | PRN

## 2014-10-07 MED ORDER — HYDROMORPHONE HCL 1 MG/ML IJ SOLN
0.5000 mg | INTRAMUSCULAR | Status: DC | PRN
  Administered 2014-10-08: 1 mg via INTRAVENOUS
  Filled 2014-10-07: qty 1

## 2014-10-07 MED ORDER — CEFAZOLIN SODIUM-DEXTROSE 2-3 GM-% IV SOLR
2.0000 g | Freq: Four times a day (QID) | INTRAVENOUS | Status: AC
  Administered 2014-10-07 – 2014-10-08 (×2): 2 g via INTRAVENOUS
  Filled 2014-10-07 (×2): qty 50

## 2014-10-07 MED ORDER — FENTANYL CITRATE (PF) 250 MCG/5ML IJ SOLN
INTRAMUSCULAR | Status: AC
Start: 2014-10-07 — End: 2014-10-07
  Filled 2014-10-07: qty 5

## 2014-10-07 MED ORDER — FENTANYL CITRATE (PF) 100 MCG/2ML IJ SOLN
INTRAMUSCULAR | Status: DC | PRN
  Administered 2014-10-07: 25 ug via INTRAVENOUS
  Administered 2014-10-07: 50 ug via INTRAVENOUS

## 2014-10-07 MED ORDER — BUPIVACAINE-EPINEPHRINE (PF) 0.5% -1:200000 IJ SOLN
INTRAMUSCULAR | Status: DC | PRN
  Administered 2014-10-07: 25 mL via PERINEURAL

## 2014-10-07 MED ORDER — SODIUM CHLORIDE 0.9 % IV SOLN
2000.0000 mg | INTRAVENOUS | Status: DC | PRN
  Administered 2014-10-07: 2000 mg via INTRAVENOUS

## 2014-10-07 MED ORDER — 0.9 % SODIUM CHLORIDE (POUR BTL) OPTIME
TOPICAL | Status: DC | PRN
  Administered 2014-10-07: 1000 mL

## 2014-10-07 MED ORDER — METOCLOPRAMIDE HCL 5 MG/ML IJ SOLN: 5.0000 mg | Freq: Three times a day (TID) | INTRAMUSCULAR | Status: DC | PRN

## 2014-10-07 MED ORDER — MAGNESIUM CITRATE PO SOLN: 1.0000 | Freq: Once | ORAL | Status: DC | PRN

## 2014-10-07 MED ORDER — CEFAZOLIN SODIUM-DEXTROSE 2-3 GM-% IV SOLR
2.0000 g | INTRAVENOUS | Status: AC
  Administered 2014-10-07: 2 g via INTRAVENOUS
  Filled 2014-10-07: qty 50

## 2014-10-07 MED ORDER — SODIUM CHLORIDE 0.9 % IR SOLN
Status: DC | PRN
  Administered 2014-10-07: 3000 mL

## 2014-10-07 MED ORDER — ONDANSETRON HCL 4 MG/2ML IJ SOLN
INTRAMUSCULAR | Status: AC
  Filled 2014-10-07: qty 2

## 2014-10-07 MED ORDER — ACETAMINOPHEN 10 MG/ML IV SOLN
1000.0000 mg | Freq: Four times a day (QID) | INTRAVENOUS | Status: DC
  Administered 2014-10-07: 1000 mg via INTRAVENOUS
  Filled 2014-10-07: qty 100

## 2014-10-07 MED ORDER — RIVAROXABAN 10 MG PO TABS
10.0000 mg | ORAL_TABLET | Freq: Every day | ORAL | Status: DC
  Administered 2014-10-08 – 2014-10-10 (×3): 10 mg via ORAL
  Filled 2014-10-07 (×3): qty 1

## 2014-10-07 MED ORDER — FENTANYL CITRATE (PF) 100 MCG/2ML IJ SOLN
INTRAMUSCULAR | Status: AC
  Filled 2014-10-07: qty 2

## 2014-10-07 MED ORDER — HYDROCHLOROTHIAZIDE 25 MG PO TABS
25.0000 mg | ORAL_TABLET | Freq: Every day | ORAL | Status: DC
  Administered 2014-10-08 – 2014-10-09 (×2): 25 mg via ORAL
  Filled 2014-10-07 (×4): qty 1

## 2014-10-07 MED ORDER — MENTHOL 3 MG MT LOZG: 1.0000 | LOZENGE | OROMUCOSAL | Status: DC | PRN

## 2014-10-07 MED ORDER — DOCUSATE SODIUM 100 MG PO CAPS
100.0000 mg | ORAL_CAPSULE | Freq: Two times a day (BID) | ORAL | Status: DC
  Administered 2014-10-07 – 2014-10-10 (×6): 100 mg via ORAL
  Filled 2014-10-07 (×5): qty 1

## 2014-10-07 MED ORDER — HYDROMORPHONE HCL 2 MG PO TABS
2.0000 mg | ORAL_TABLET | ORAL | Status: DC | PRN
  Administered 2014-10-07: 4 mg via ORAL
  Administered 2014-10-07: 2 mg via ORAL
  Administered 2014-10-07 – 2014-10-09 (×9): 4 mg via ORAL
  Administered 2014-10-09 – 2014-10-10 (×2): 2 mg via ORAL
  Filled 2014-10-07: qty 2
  Filled 2014-10-07 (×2): qty 1
  Filled 2014-10-07 (×8): qty 2
  Filled 2014-10-07: qty 1
  Filled 2014-10-07: qty 2

## 2014-10-07 MED ORDER — EPHEDRINE SULFATE 50 MG/ML IJ SOLN
INTRAMUSCULAR | Status: DC | PRN
  Administered 2014-10-07: 10 mg via INTRAVENOUS

## 2014-10-07 MED ORDER — FENTANYL CITRATE (PF) 100 MCG/2ML IJ SOLN
25.0000 ug | INTRAMUSCULAR | Status: DC | PRN
  Administered 2014-10-07 (×3): 25 ug via INTRAVENOUS
  Administered 2014-10-07: 50 ug via INTRAVENOUS
  Administered 2014-10-07: 25 ug via INTRAVENOUS

## 2014-10-07 MED ORDER — ALUM & MAG HYDROXIDE-SIMETH 200-200-20 MG/5ML PO SUSP: 30.0000 mL | ORAL | Status: DC | PRN

## 2014-10-07 MED ORDER — LIDOCAINE HCL (CARDIAC) 20 MG/ML IV SOLN
INTRAVENOUS | Status: AC
  Filled 2014-10-07: qty 10

## 2014-10-07 MED ORDER — ONDANSETRON HCL 4 MG/2ML IJ SOLN: 4.0000 mg | Freq: Once | INTRAMUSCULAR | Status: DC | PRN

## 2014-10-07 MED ORDER — DIPHENHYDRAMINE HCL 12.5 MG/5ML PO ELIX: 12.5000 mg | ORAL_SOLUTION | ORAL | Status: DC | PRN

## 2014-10-07 MED ORDER — ONDANSETRON HCL 4 MG/2ML IJ SOLN
INTRAMUSCULAR | Status: DC | PRN
  Administered 2014-10-07: 4 mg via INTRAVENOUS

## 2014-10-07 MED ORDER — OCUVITE PO TABS
1.0000 | ORAL_TABLET | Freq: Every day | ORAL | Status: DC
  Administered 2014-10-07 – 2014-10-10 (×4): 1 via ORAL
  Filled 2014-10-07 (×5): qty 1

## 2014-10-07 MED ORDER — FLUTICASONE PROPIONATE 50 MCG/ACT NA SUSP
2.0000 | Freq: Every day | NASAL | Status: DC | PRN
  Filled 2014-10-07: qty 16

## 2014-10-07 MED ORDER — FENTANYL CITRATE (PF) 100 MCG/2ML IJ SOLN
INTRAMUSCULAR | Status: AC
  Administered 2014-10-07: 50 ug via INTRAVENOUS
  Filled 2014-10-07: qty 2

## 2014-10-07 MED ORDER — LACTATED RINGERS IV SOLN
INTRAVENOUS | Status: DC
  Administered 2014-10-07: 09:00:00 via INTRAVENOUS

## 2014-10-07 MED ORDER — PHENYLEPHRINE HCL 10 MG/ML IJ SOLN
INTRAMUSCULAR | Status: DC | PRN
  Administered 2014-10-07: 40 ug via INTRAVENOUS
  Administered 2014-10-07 (×2): 80 ug via INTRAVENOUS
  Administered 2014-10-07: 120 ug via INTRAVENOUS
  Administered 2014-10-07: 80 ug via INTRAVENOUS
  Administered 2014-10-07: 120 ug via INTRAVENOUS
  Administered 2014-10-07: 80 ug via INTRAVENOUS

## 2014-10-07 MED ORDER — BUPIVACAINE-EPINEPHRINE 0.25% -1:200000 IJ SOLN
INTRAMUSCULAR | Status: DC | PRN
  Administered 2014-10-07: 30 mL

## 2014-10-07 MED ORDER — LIDOCAINE HCL (CARDIAC) 20 MG/ML IV SOLN
INTRAVENOUS | Status: DC | PRN
  Administered 2014-10-07: 100 mg via INTRAVENOUS

## 2014-10-07 MED ORDER — PHENYLEPHRINE 40 MCG/ML (10ML) SYRINGE FOR IV PUSH (FOR BLOOD PRESSURE SUPPORT)
PREFILLED_SYRINGE | INTRAVENOUS | Status: AC
  Filled 2014-10-07: qty 10

## 2014-10-07 MED ORDER — BACLOFEN 10 MG PO TABS: 10.0000 mg | ORAL_TABLET | Freq: Every day | ORAL | Status: DC | PRN

## 2014-10-07 MED ORDER — MIDAZOLAM HCL 2 MG/2ML IJ SOLN
INTRAMUSCULAR | Status: AC
  Filled 2014-10-07: qty 4

## 2014-10-07 MED ORDER — PROPOFOL 10 MG/ML IV BOLUS
INTRAVENOUS | Status: AC
  Filled 2014-10-07: qty 20

## 2014-10-07 MED ORDER — TRANEXAMIC ACID 1000 MG/10ML IV SOLN
2000.0000 mg | INTRAVENOUS | Status: DC
  Filled 2014-10-07: qty 20

## 2014-10-07 MED ORDER — BISACODYL 10 MG RE SUPP
10.0000 mg | Freq: Every day | RECTAL | Status: DC | PRN
  Administered 2014-10-09: 10 mg via RECTAL
  Filled 2014-10-07 (×2): qty 1

## 2014-10-07 MED ORDER — SODIUM CHLORIDE 0.9 % IV SOLN
INTRAVENOUS | Status: DC
  Administered 2014-10-07: 22:00:00 via INTRAVENOUS

## 2014-10-07 MED ORDER — SODIUM CHLORIDE 0.9 % IV SOLN: INTRAVENOUS | Status: DC

## 2014-10-07 MED ORDER — ONDANSETRON HCL 4 MG/2ML IJ SOLN: 4.0000 mg | Freq: Four times a day (QID) | INTRAMUSCULAR | Status: DC | PRN

## 2014-10-07 MED ORDER — LOSARTAN POTASSIUM 50 MG PO TABS
100.0000 mg | ORAL_TABLET | Freq: Every day | ORAL | Status: DC
  Administered 2014-10-08 – 2014-10-09 (×2): 100 mg via ORAL
  Filled 2014-10-07 (×3): qty 2

## 2014-10-07 MED ORDER — PROPOFOL 10 MG/ML IV BOLUS
INTRAVENOUS | Status: DC | PRN
  Administered 2014-10-07: 50 mg via INTRAVENOUS
  Administered 2014-10-07: 30 mg via INTRAVENOUS
  Administered 2014-10-07 (×2): 50 mg via INTRAVENOUS

## 2014-10-07 MED ORDER — FENTANYL CITRATE (PF) 100 MCG/2ML IJ SOLN
50.0000 ug | INTRAMUSCULAR | Status: DC | PRN
  Administered 2014-10-07: 50 ug via INTRAVENOUS

## 2014-10-07 MED ORDER — METOCLOPRAMIDE HCL 5 MG PO TABS: 5.0000 mg | ORAL_TABLET | Freq: Three times a day (TID) | ORAL | Status: DC | PRN

## 2014-10-07 SURGICAL SUPPLY — 62 items
BAG DECANTER FOR FLEXI CONT (MISCELLANEOUS) ×2 IMPLANT
BANDAGE ESMARK 6X9 LF (GAUZE/BANDAGES/DRESSINGS) ×1 IMPLANT
BLADE SAGITTAL 25.0X1.19X90 (BLADE) ×2 IMPLANT
BLADE SAGITTAL 25.0X1.19X90MM (BLADE) ×1
BNDG CMPR 9X6 STRL LF SNTH (GAUZE/BANDAGES/DRESSINGS) ×1
BNDG ESMARK 6X9 LF (GAUZE/BANDAGES/DRESSINGS) ×3
BOWL SMART MIX CTS (DISPOSABLE) ×3 IMPLANT
CAP KNEE TOTAL 3 SIGMA ×2 IMPLANT
CEMENT HV SMART SET (Cement) ×6 IMPLANT
COVER SURGICAL LIGHT HANDLE (MISCELLANEOUS) ×3 IMPLANT
CUFF TOURNIQUET SINGLE 34IN LL (TOURNIQUET CUFF) ×2 IMPLANT
CUFF TOURNIQUET SINGLE 44IN (TOURNIQUET CUFF) IMPLANT
DECANTER SPIKE VIAL GLASS SM (MISCELLANEOUS) ×2 IMPLANT
DRAPE EXTREMITY T 121X128X90 (DRAPE) ×3 IMPLANT
DRAPE IMP U-DRAPE 54X76 (DRAPES) ×3 IMPLANT
DRAPE PROXIMA HALF (DRAPES) ×3 IMPLANT
DRSG ADAPTIC 3X8 NADH LF (GAUZE/BANDAGES/DRESSINGS) ×3 IMPLANT
DRSG PAD ABDOMINAL 8X10 ST (GAUZE/BANDAGES/DRESSINGS) ×4 IMPLANT
DURAPREP 26ML APPLICATOR (WOUND CARE) ×6 IMPLANT
ELECT CAUTERY BLADE 6.4 (BLADE) ×3 IMPLANT
ELECT REM PT RETURN 9FT ADLT (ELECTROSURGICAL) ×3
ELECTRODE REM PT RTRN 9FT ADLT (ELECTROSURGICAL) ×1 IMPLANT
EVACUATOR 1/8 PVC DRAIN (DRAIN) ×2 IMPLANT
FACESHIELD WRAPAROUND (MASK) ×6 IMPLANT
FACESHIELD WRAPAROUND OR TEAM (MASK) ×2 IMPLANT
GAUZE SPONGE 4X4 12PLY STRL (GAUZE/BANDAGES/DRESSINGS) ×3 IMPLANT
GLOVE BIOGEL PI IND STRL 8 (GLOVE) ×1 IMPLANT
GLOVE BIOGEL PI IND STRL 8.5 (GLOVE) ×1 IMPLANT
GLOVE BIOGEL PI INDICATOR 8 (GLOVE) ×2
GLOVE BIOGEL PI INDICATOR 8.5 (GLOVE) ×2
GLOVE ECLIPSE 8.0 STRL XLNG CF (GLOVE) ×6 IMPLANT
GLOVE SURG ORTHO 8.5 STRL (GLOVE) ×6 IMPLANT
GOWN STRL REUS W/ TWL LRG LVL3 (GOWN DISPOSABLE) ×2 IMPLANT
GOWN STRL REUS W/TWL 2XL LVL3 (GOWN DISPOSABLE) ×3 IMPLANT
GOWN STRL REUS W/TWL LRG LVL3 (GOWN DISPOSABLE) ×6
HANDPIECE INTERPULSE COAX TIP (DISPOSABLE) ×3
KIT BASIN OR (CUSTOM PROCEDURE TRAY) ×3 IMPLANT
KIT ROOM TURNOVER OR (KITS) ×3 IMPLANT
MANIFOLD NEPTUNE II (INSTRUMENTS) ×3 IMPLANT
NEEDLE 22X1 1/2 (OR ONLY) (NEEDLE) IMPLANT
NS IRRIG 1000ML POUR BTL (IV SOLUTION) ×3 IMPLANT
PACK TOTAL JOINT (CUSTOM PROCEDURE TRAY) ×3 IMPLANT
PACK UNIVERSAL I (CUSTOM PROCEDURE TRAY) ×3 IMPLANT
PAD ARMBOARD 7.5X6 YLW CONV (MISCELLANEOUS) ×6 IMPLANT
PAD CAST 4YDX4 CTTN HI CHSV (CAST SUPPLIES) ×1 IMPLANT
PADDING CAST COTTON 4X4 STRL (CAST SUPPLIES) ×3
PADDING CAST COTTON 6X4 STRL (CAST SUPPLIES) ×3 IMPLANT
SET HNDPC FAN SPRY TIP SCT (DISPOSABLE) ×1 IMPLANT
STAPLER VISISTAT 35W (STAPLE) ×3 IMPLANT
SUCTION FRAZIER TIP 10 FR DISP (SUCTIONS) ×3 IMPLANT
SURGIFLO W/THROMBIN 8M KIT (HEMOSTASIS) IMPLANT
SUT BONE WAX W31G (SUTURE) ×3 IMPLANT
SUT ETHIBOND NAB CT1 #1 30IN (SUTURE) ×6 IMPLANT
SUT MNCRL AB 3-0 PS2 18 (SUTURE) ×3 IMPLANT
SUT VIC AB 0 CT1 27 (SUTURE) ×3
SUT VIC AB 0 CT1 27XBRD ANBCTR (SUTURE) ×1 IMPLANT
SYR CONTROL 10ML LL (SYRINGE) IMPLANT
TOWEL OR 17X24 6PK STRL BLUE (TOWEL DISPOSABLE) ×3 IMPLANT
TOWEL OR 17X26 10 PK STRL BLUE (TOWEL DISPOSABLE) ×3 IMPLANT
TRAY FOLEY CATH 16FRSI W/METER (SET/KITS/TRAYS/PACK) ×2 IMPLANT
WATER STERILE IRR 1000ML POUR (IV SOLUTION) ×6 IMPLANT
WRAP KNEE MAXI GEL POST OP (GAUZE/BANDAGES/DRESSINGS) ×3 IMPLANT

## 2014-10-07 NOTE — Op Note (Signed)
PATIENT ID:      LUWANNA BROSSMAN  MRN:     [id number] DOB/AGE:    1940/08/22 / 74 y.o.       OPERATIVE REPORT    DATE OF PROCEDURE:  10/07/2014       PREOPERATIVE DIAGNOSIS:   END STAGE PRIMARY OSTEOARTHRITIS OF LEFT KNEE                                                       Estimated body mass index is 43.29 kg/(m^2) as calculated from the following:   Height as of this encounter: 5\' 1"  (1.549 m).   Weight as of this encounter: 103.874 kg (229 lb).     POSTOPERATIVE DIAGNOSIS:   END STAGE PRIMARY OSTEOARTHRITIS OF LEFT KNEE                                                                     Estimated body mass index is 43.29 kg/(m^2) as calculated from the following:   Height as of this encounter: 5\' 1"  (1.549 m).   Weight as of this encounter: 103.874 kg (229 lb).     PROCEDURE:  Procedure(s): TOTAL KNEE ARTHROPLASTY left     SURGEON:  Joni Fears, MD    ASSISTANT:   Biagio Borg, PA-C   (Present and scrubbed throughout the case, critical for assistance with exposure, retraction, instrumentation, and closure.)          ANESTHESIA: regional and general     DRAINS: (left knee) Hemovact drain(s) in the clamped with  Suction Clamped :      TOURNIQUET TIME: * Missing tourniquet times found for documented tourniquets in log:  [id number] *    COMPLICATIONS:  None   CONDITION:  stable  PROCEDURE IN DETAIL: [place], Pearlee Arvizu W 10/07/2014, 11:35 AM

## 2014-10-07 NOTE — Anesthesia Preprocedure Evaluation (Addendum)
Anesthesia Evaluation  Patient identified by MRN, date of birth, ID band Patient awake    Reviewed: Allergy & Precautions, NPO status , Patient's Chart, lab work & pertinent test results  History of Anesthesia Complications (+) PONV, Family history of anesthesia reaction and history of anesthetic complications  Airway Mallampati: II  TM Distance: >3 FB Neck ROM: Full    Dental  (+) Dental Advisory Given, Edentulous Upper   Pulmonary sleep apnea and Continuous Positive Airway Pressure Ventilation ,    Pulmonary exam normal breath sounds clear to auscultation       Cardiovascular Exercise Tolerance: Good hypertension, Pt. on medications + Peripheral Vascular Disease  Normal cardiovascular exam Rhythm:Regular Rate:Normal     Neuro/Psych  Neuromuscular disease    GI/Hepatic negative GI ROS, Neg liver ROS,   Endo/Other  Morbid obesity  Renal/GU negative Renal ROS     Musculoskeletal  (+) Arthritis , Osteoarthritis,    Abdominal   Peds  Hematology  (+) Blood dyscrasia, anemia ,   Anesthesia Other Findings Day of surgery medications reviewed with the patient.  Reproductive/Obstetrics                            Anesthesia Physical Anesthesia Plan  ASA: III  Anesthesia Plan: General   Post-op Pain Management: GA combined w/ Regional for post-op pain   Induction: Intravenous  Airway Management Planned: LMA  Additional Equipment:   Intra-op Plan:   Post-operative Plan: Extubation in OR  Informed Consent: I have reviewed the patients History and Physical, chart, labs and discussed the procedure including the risks, benefits and alternatives for the proposed anesthesia with the patient or authorized representative who has indicated his/her understanding and acceptance.   Dental advisory given and Dental Advisory Given  Plan Discussed with: CRNA, Anesthesiologist and  Surgeon  Anesthesia Plan Comments: (Risks/benefits of general anesthesia discussed with patient including risk of damage to teeth, lips, gum, and tongue, nausea/vomiting, allergic reactions to medications, and the possibility of heart attack, stroke and death.  All patient questions answered.  Patient wishes to proceed.  Discussed risks of femoral nerve block including failure, bleeding, infection, nerve damage.  Femoral nerve block does not usually prevent all pain. Specifically, it treats the anterior, but often not the posterior knee. Questions answered.  Patient consents to block. )       Anesthesia Quick Evaluation

## 2014-10-07 NOTE — Anesthesia Procedure Notes (Addendum)
Anesthesia Regional Block:  Femoral nerve block  Pre-Anesthetic Checklist: ,, timeout performed, Correct Patient, Correct Site, Correct Laterality, Correct Procedure, Correct Position, site marked, Risks and benefits discussed,  Surgical consent,  Pre-op evaluation,  At surgeon's request and post-op pain management  Laterality: Left  Prep: chloraprep       Needles:  Injection technique: Single-shot  Needle Type: Echogenic Needle     Needle Length: 9cm 9 cm Needle Gauge: 22 and 22 G    Additional Needles:  Procedures: ultrasound guided (picture in chart) Femoral nerve block Narrative:  Start time: 10/07/2014 9:10 AM End time: 10/07/2014 9:15 AM Injection made incrementally with aspirations every 5 mL.  Performed by: Personally  Anesthesiologist: Catalina Gravel  Additional Notes: Pt. tolerated procedure well. Good perineural spread visualized on [place].   Procedure Name: LMA Insertion Date/Time: 10/07/2014 10:01 AM Performed by: Neldon Newport Pre-anesthesia Checklist: Patient being monitored, Suction available, Emergency Drugs available, Patient identified and Timeout performed Patient Re-evaluated:Patient Re-evaluated prior to inductionOxygen Delivery Method: Circle system utilized Preoxygenation: Pre-oxygenation with 100% oxygen Intubation Type: IV induction Ventilation: Mask ventilation without difficulty LMA: LMA inserted LMA Size: 4.0

## 2014-10-07 NOTE — Progress Notes (Signed)
Orthopedic Tech Progress Note Patient Details:  Diane Daugherty [birth year] [id number]  CPM Left Knee CPM Left Knee: On Left Knee Flexion (Degrees): 90 Left Knee Extension (Degrees): 0 cpm machine will be provided when ortho gets one  [name] 10/07/2014, 12:50 PM

## 2014-10-07 NOTE — H&P (Signed)
  The recent History & Physical has been reviewed. I have personally examined the patient today. There is no interval change to the documented History & Physical. The patient would like to proceed with the procedure.  Joni Fears W 10/07/2014,  9:33 AM

## 2014-10-07 NOTE — Progress Notes (Signed)
Orthopedic Tech Progress Note Patient Details:  Diane Daugherty 16-Sep-1940 [id number] On cpm at 6:45 pm 0-60 Patient ID: Diane Daugherty, female   DOB: 1940/09/20, 74 y.o.   MRN: [id number]   Diane Daugherty 10/07/2014, 6:45 PM

## 2014-10-07 NOTE — Progress Notes (Signed)
Utilization review completed.  

## 2014-10-07 NOTE — Anesthesia Postprocedure Evaluation (Signed)
  Anesthesia Post-op Note  Patient: Diane Daugherty  Procedure(s) Performed: Procedure(s) (LRB): TOTAL KNEE ARTHROPLASTY (Left)  Patient Location: PACU  Anesthesia Type: GA combined with regional for post-op pain  Level of Consciousness: awake and alert   Airway and Oxygen Therapy: Patient Spontanous Breathing  Post-op Pain: mild  Post-op Assessment: Post-op Vital signs reviewed, Patient's Cardiovascular Status Stable, Respiratory Function Stable, Patent Airway and No signs of Nausea or vomiting  Last Vitals:  Filed Vitals:   10/07/14 1556  BP:   Pulse:   Temp: 36.5 C  Resp:     Post-op Vital Signs: stable   Complications: No apparent anesthesia complications

## 2014-10-07 NOTE — Transfer of Care (Signed)
Immediate Anesthesia Transfer of Care Note  Patient: Diane Daugherty  Procedure(s) Performed: Procedure(s): TOTAL KNEE ARTHROPLASTY (Left)  Patient Location: PACU  Anesthesia Type:General  Level of Consciousness: awake, alert  and oriented  Airway & Oxygen Therapy: Patient Spontanous Breathing and Patient connected to nasal cannula oxygen  Post-op Assessment: Report given to RN, Post -op Vital signs reviewed and stable and Patient moving all extremities X 4  Post vital signs: Reviewed and stable  Last Vitals:  Filed Vitals:   10/07/14 1155  BP: 155/57  Pulse: 98  Temp: 36.4 C  Resp: 11    Complications: No apparent anesthesia complications

## 2014-10-08 ENCOUNTER — Encounter (HOSPITAL_COMMUNITY): Payer: Self-pay | Admitting: General Practice

## 2014-10-08 LAB — CBC
HEMATOCRIT: 28.4 % — AB (ref 36.0–46.0)
HEMOGLOBIN: 9.5 g/dL — AB (ref 12.0–15.0)
MCH: 31 pg (ref 26.0–34.0)
MCHC: 33.5 g/dL (ref 30.0–36.0)
MCV: 92.8 fL (ref 78.0–100.0)
Platelets: 200 10*3/uL (ref 150–400)
RBC: 3.06 MIL/uL — AB (ref 3.87–5.11)
RDW: 14.7 % (ref 11.5–15.5)
WBC: 8 10*3/uL (ref 4.0–10.5)

## 2014-10-08 LAB — BASIC METABOLIC PANEL
ANION GAP: 7 (ref 5–15)
BUN: 14 mg/dL (ref 6–20)
CHLORIDE: 102 mmol/L (ref 101–111)
CO2: 26 mmol/L (ref 22–32)
Calcium: 8.7 mg/dL — ABNORMAL LOW (ref 8.9–10.3)
Creatinine, Ser: 1.1 mg/dL — ABNORMAL HIGH (ref 0.44–1.00)
GFR calc Af Amer: 57 mL/min — ABNORMAL LOW (ref >60)
GFR calc non Af Amer: 49 mL/min — ABNORMAL LOW (ref >60)
GLUCOSE: 124 mg/dL — AB (ref 65–99)
POTASSIUM: 4.3 mmol/L (ref 3.5–5.1)
Sodium: 135 mmol/L (ref 135–145)

## 2014-10-08 MED ORDER — INFLUENZA VAC SPLIT QUAD 0.5 ML IM SUSY
0.5000 mL | PREFILLED_SYRINGE | INTRAMUSCULAR | Status: DC
  Filled 2014-10-08: qty 0.5

## 2014-10-08 MED ORDER — PNEUMOCOCCAL VAC POLYVALENT 25 MCG/0.5ML IJ INJ
0.5000 mL | INJECTION | INTRAMUSCULAR | Status: AC
  Administered 2014-10-09: 0.5 mL via INTRAMUSCULAR
  Filled 2014-10-08: qty 0.5

## 2014-10-08 NOTE — Clinical Social Work Placement (Signed)
   CLINICAL SOCIAL WORK PLACEMENT  NOTE  Date:  10/08/2014  Patient Details  Name: Diane Daugherty MRN: [id number] Date of Birth: October 21, 1940  Clinical Social Work is seeking post-discharge placement for this patient at the [place] level of care (*CSW will initial, date and re-position this form in  chart as items are completed):  Yes   Patient/family provided with [place] Work Department's list of facilities offering this level of care within the geographic area requested by the patient (or if unable, by the patient's family).  Yes   Patient/family informed of their freedom to choose among providers that offer the needed level of care, that participate in Medicare, Medicaid or managed care program needed by the patient, have an available bed and are willing to accept the patient.  Yes   Patient/family informed of [facility]'s ownership interest in [facility] and [facility], as well as of the fact that they are under no obligation to receive care at these facilities.  PASRR submitted to EDS on 10/08/14     PASRR number received on 10/08/14     Existing PASRR number confirmed on  (n/a)     FL2 transmitted to all facilities in geographic area requested by pt/family on 10/08/14     FL2 transmitted to all facilities within larger geographic area on  (n/a)     Patient informed that his/her managed care company has contracts with or will negotiate with certain facilities, including the following:   (yes, [place])     Yes   Patient/family informed of bed offers received.  Patient chooses bed at [facility]     Physician recommends and patient chooses bed at      Patient to be transferred to [facility] on  .  Patient to be transferred to facility by       Patient family notified on   of transfer.  Name of family member notified:        PHYSICIAN Please sign FL2     Additional Comment:     _______________________________________________ Caroline Sauger, LCSW 10/08/2014, 11:25 AM

## 2014-10-08 NOTE — Progress Notes (Signed)
Order placed for precautions, no cultures noted that would cause precautions to be done MRSA neg, Urine neg and Stool Neg, charge nurse notified

## 2014-10-08 NOTE — Progress Notes (Signed)
OT Cancellation Note  Patient Details Name: Diane Daugherty MRN: [id number] DOB: 11-06-1940   Cancelled Treatment:    Reason Eval/Treat Not Completed:  Pt with plans to discharge to SNF for rehab.  Will defer OT to SNF.  [name] 10/08/2014, 9:55 AM  [phone]

## 2014-10-08 NOTE — Discharge Instructions (Signed)
Information on my medicine - XARELTO (Rivaroxaban)  This medication education was reviewed with me or my healthcare representative as part of my discharge preparation.     Why was Xarelto prescribed for you? Xarelto was prescribed for you to reduce the risk of blood clots forming after orthopedic surgery. The medical term for these abnormal blood clots is venous thromboembolism (VTE).  What do you need to know about xarelto ? Take your Xarelto 10mg  ONCE DAILY at the same time every day. You may take it either with or without food.  If you have difficulty swallowing the tablet whole, you may crush it and mix in applesauce just prior to taking your dose.  Take Xarelto exactly as prescribed by your doctor and DO NOT stop taking Xarelto without talking to the doctor who prescribed the medication.  Stopping without other VTE prevention medication to take the place of Xarelto may increase your risk of developing a clot.  After discharge, you should have regular check-up appointments with your healthcare provider that is prescribing your Xarelto.    What do you do if you miss a dose? If you miss a dose, take it as soon as you remember on the same day then continue your regularly scheduled once daily regimen the next day. Do not take two doses of Xarelto on the same day.   Important Safety Information A possible side effect of Xarelto is bleeding. You should call your healthcare provider right away if you experience any of the following: ? Bleeding from an injury or your nose that does not stop. ? Unusual colored urine (red or dark brown) or unusual colored stools (red or black). ? Unusual bruising for unknown reasons. ? A serious fall or if you hit your head (even if there is no bleeding).  Some medicines may interact with Xarelto and might increase your risk of bleeding while on Xarelto. To help avoid this, consult your healthcare provider or pharmacist prior to using any new  prescription or non-prescription medications, including herbals, vitamins, non-steroidal anti-inflammatory drugs (NSAIDs) and supplements.  This website has more information on Xarelto: [email or web address]

## 2014-10-08 NOTE — Procedures (Signed)
Pt placed on hospital CPAP machine tolerating well and resting comfortably.

## 2014-10-08 NOTE — Op Note (Signed)
NAMESHIRLA, HODGKISS NO.:  [id number]  MEDICAL RECORD NO.:  [id number]  LOCATION:  5N28C                        FACILITY:  [facility]  PHYSICIAN:  Vonna Kotyk. Durward Fortes, M.D.DATE OF BIRTH:  09-12-1940  DATE OF PROCEDURE:  10/07/2014 DATE OF DISCHARGE:                              OPERATIVE REPORT   PREOPERATIVE DIAGNOSIS:  Primary end-stage osteoarthritis, left knee.  POSTOPERATIVE DIAGNOSIS:  Primary end-stage osteoarthritis, left knee.  PROCEDURE:  Left total knee replacement.  SURGEON:  Vonna Kotyk. Durward Fortes, MD  ASSISTANT:  Biagio Borg, PA-C  ANESTHESIA:  General with supplemental femoral nerve block.  COMPLICATIONS:  None.  COMPONENTS: 1. DePuY LCS medium femoral component. 2. Rotating keeled tibial tray 10-mm polyethylene bridging, bearing,     and metal back. 3. PEG rotating patella.  Components were secured with polymethyl     methacrylate.  PROCEDURE:  Ms. Meras was met with her family in the holding area preop.  I identified the left knee as appropriate operative site and marked it accordingly.  She received a femoral nerve block per Anesthesia.  The patient was then transported to room #7 and placed under general anesthesia without difficulty.  Nursing staff inserted Foley catheter. Tourniquet was applied to the left thigh.  The left lower extremity was then prepped with chlorhexidine scrub and DuraPrep x2.  Sterile draping was performed.  Time-out was called.  With the extremity elevated, it was esmarch'ed, exsanguinated with a proximal tourniquet at 350 mmHg.  A longitudinal incision was made centered about the left knee extending from the superior pouch to tibial tubercle for a sharp dissection. Incision was carried down to subcutaneous tissue.  There was abundant adipose tissue.  This was incised in the midline through the 1st layer of capsule of medial parapatellar, incision was made through the deep capsule.  The joint was entered.   There was minimal clear yellow joint effusion.  The patella was everted 180 degrees laterally, knee flexed 90 degrees.  There was a moderate amount of beefy red synovitis.  A synovectomy was performed.  There were large osteophytes along the medial and lateral femoral condyles, these were removed with a rongeur. I then measured a medium femoral component.  First, bony cut was made transversely in the proximal tibia with a 7- degree angle of declination using the external tibial guide after each bony cut on the tibia and the femur.  I checked the alignment with the external guide.  Subsequent cuts were then made on the femur using the medium femoral jig.  Distal femoral cut was a 4-degree valgus position.  Flexion-extension gaps were perfectly symmetrical at 10 mm.  MCL and LCL remained intact throughout the procedure.  Laminar spreaders were then inserted along the medial and lateral compartment.  Medial and lateral menisci, ACL and PCL were removed as well as osteophytes in the posterior femoral condyles medially and laterally.  There was a small Baker cyst.  The visualized portion of the cyst was removed.  Final cuts were then made on the femur for tapering purposes and the center holes for the component.  Retractors were then placed about the tibia, was advanced anteriorly and measured a #2  tibial tray, this was pinned in place.  Center hole was cut, made, followed by the keeled cut with the tibial jig in place.  The 10 mm polyethylene trial bridging bearing was inserted followed by the medium femoral component.  This was reduced and through full range of motion, we had perfect stability, full extension, flexion well over 100 degrees.  Patella was prepared by removing approximately 9 mm of bone leaving 13 mm patella thickness.  Three holes were then made with the patella jig. The trial patella inserted and through a full range of motion, after reduction was perfectly  stable.  The trial components were removed.  The joint was copiously irrigated with saline solution.  The final components were then impacted with polymethyl methacrylate. We initially inserted the #2 tibial tray followed by the 10-mm polyethylene bridging bearing and the medium femoral component. Extraneous methacrylate removed from the edges.  The knee was placed in extension.  Patella was applied with methacrylate and a patellar clamp. At approximately 16 minutes, the methacrylate had matured, during which time, we injected the capsule with 0.25% Marcaine with epinephrine.  The joint was then inspected.  Any extraneous methacrylate was removed with an osteotome.  We had excellent range of motion without instability.  I did use tranexamic acid topically to the joint for several minutes.  The tourniquet was deflated just over an hour.  We had excellent capillary refill to the joint and very minimal bleeding.  A medium-size Hemovac was inserted along the lateral compartment.  The deep capsule was then closed with a running #1 Ethibond, superficial capsule with 0 Vicryl and 3-0 Monocryl.  Skin closed with skin clips. Sterile bulky dressing was applied followed by the patient's support stocking.  The patient tolerated the procedure without complications.     Vonna Kotyk. Durward Fortes, M.D.    PWW/MEDQ  D:  10/07/2014  T:  10/08/2014  Job:  [id number]

## 2014-10-08 NOTE — Clinical Social Work Note (Signed)
Clinical Social Work Assessment  Patient Details  Name: Diane Daugherty MRN: [id number] Date of Birth: [birth year]  Date of referral:  10/08/14               Reason for consult:  Facility Placement, Discharge Planning                Permission sought to share information with:  Chartered certified accountant granted to share information::  Yes, Verbal Permission Granted  Name::        Agency::  Publishing copy (pre-registered)  Relationship::     Contact Information:     Housing/Transportation Living arrangements for the past 2 months:  Single Family Home Source of Information:  Patient Patient Interpreter Needed:  None Criminal Activity/Legal Involvement Pertinent to Current Situation/Hospitalization:  No - Comment as needed Significant Relationships:  Adult Children (Live in [place].) Lives with:  Self Do you feel safe going back to the place where you live?  No (High fall risk.) Need for family participation in patient care:  No (Coment) (Patient able to make own decisions.)  Care giving concerns:  Patient expressed no concerns at this time.   Social Worker assessment / plan:  CSW received referral for possible SNF placement at time of discharge. CSW met with patient to discuss discharge disposition. Per patient, patient has pre-registered with [facility] prior to admission. Patient anticipates to discharge to [facility] once medically stable. CSW to continue to follow and assist with discharge planning needs.  Employment status:  Retired Forensic scientist:  Medicare PT Recommendations:  [place] / Referral to community resources:  [place]  Patient/Family's Response to care:  Patient understanding and agreeable to CSW plan of care.  Patient/Family's Understanding of and Emotional Response to Diagnosis, Current Treatment, and Prognosis:  Patient understanding and agreeable to CSW plan of  care.  Emotional Assessment Appearance:  Appears stated age Attitude/Demeanor/Rapport:  Other (Pleasant.) Affect (typically observed):  Accepting, Pleasant, Appropriate Orientation:  Oriented to Self, Oriented to Place, Oriented to  Time, Oriented to Situation Alcohol / Substance use:  Not Applicable Psych involvement (Current and /or in the community):  No (Comment) (Not appropriate on this admission.)  Discharge Needs  Concerns to be addressed:  No discharge needs identified Readmission within the last 30 days:  No Current discharge risk:  None Barriers to Discharge:  No Barriers Identified   Caroline Sauger, LCSW 10/08/2014, 11:21 AM [phone]

## 2014-10-08 NOTE — Progress Notes (Signed)
Patient ID: Diane Daugherty, female   DOB: [birth year], 74 y.o.   MRN: [id number] PATIENT ID: Diane Daugherty        MRN:  [id number]          DOB/AGE: 1940-01-31 / 74 y.o.    Joni Fears, MD   Biagio Borg, PA-C [address] [place], [place]  [address]                             [phone]   PROGRESS NOTE  Subjective:  negative for Chest Pain  negative for Shortness of Breath  negative for Nausea/Vomiting   negative for Calf Pain    Tolerating Diet: yes         Patient reports pain as mild.     Comfortable night  Objective: Vital signs in last 24 hours:   Patient Vitals for the past 24 hrs:  BP Temp Temp src Pulse Resp SpO2 Height Weight  10/08/14 0600 (!) 118/56 mmHg 97.9 F (36.6 C) - 71 16 94 % - -  10/07/14 2029 (!) 127/59 mmHg 98 F (36.7 C) - 85 16 98 % - -  10/07/14 1612 132/66 mmHg 97.8 F (36.6 C) Oral 80 14 100 % - -  10/07/14 1556 - 97.7 F (36.5 C) - - - - - -  10/07/14 1545 - - - 66 11 100 % - -  10/07/14 1540 (!) 127/58 mmHg - - 77 10 100 % - -  10/07/14 1530 - - - 71 12 100 % - -  10/07/14 1525 (!) 140/48 mmHg - - 66 11 100 % - -  10/07/14 1515 - - - 70 12 100 % - -  10/07/14 1510 132/64 mmHg - - 81 15 100 % - -  10/07/14 1500 - - - 79 (!) 9 100 % - -  10/07/14 1445 - - - 79 (!) 22 98 % - -  10/07/14 1440 (!) 125/56 mmHg - - 74 13 100 % - -  10/07/14 1430 - - - 75 18 100 % - -  10/07/14 1425 104/69 mmHg - - 68 15 100 % - -  10/07/14 1415 - - - 74 (!) 29 100 % - -  10/07/14 1410 134/63 mmHg - - 74 16 100 % - -  10/07/14 1400 - - - 71 16 100 % - -  10/07/14 1355 (!) 126/58 mmHg - - 69 (!) 6 100 % - -  10/07/14 1345 - - - 69 11 100 % - -  10/07/14 1340 (!) 135/57 mmHg - - 68 11 100 % - -  10/07/14 1330 - 97.4 F (36.3 C) - 68 (!) 8 100 % - -  10/07/14 1325 (!) 121/55 mmHg - - 74 10 91 % - -  10/07/14 1315 (!) 99/54 mmHg - - 78 11 91 % - -  10/07/14 1310 (!) 99/54 mmHg - - 74 13 93 % - -  10/07/14 1300 137/61 mmHg - - 79 (!) 9 99 % - -    10/07/14 1255 137/61 mmHg - - 83 16 100 % - -  10/07/14 1245 (!) 137/59 mmHg - - 79 19 100 % - -  10/07/14 1240 (!) 137/59 mmHg - - 83 18 100 % - -  10/07/14 1230 (!) 150/62 mmHg - - 84 10 100 % - -  10/07/14 1225 (!) 150/62 mmHg - - 86 15 100 % - -  10/07/14 1215 - - - 84 - 96 % - -  10/07/14 1200 - - - 95 (!) 9 100 % - -  10/07/14 1155 (!) 155/57 mmHg 97.5 F (36.4 C) - 98 11 100 % - -  10/07/14 0920 (!) 160/70 mmHg - - 86 13 100 % - -  10/07/14 0915 (!) 173/85 mmHg - - 83 15 100 % - -  10/07/14 0834 - - - - - - 5\' 1"  (1.549 m) 103.874 kg (229 lb)  10/07/14 0758 (!) 155/51 mmHg 97.6 F (36.4 C) Oral 76 18 98 % - 103.874 kg (229 lb)      Intake/Output from previous day:   09/13 0701 - 09/14 0700 In: 2342.5 [P.O.:240; I.V.:2002.5] Out: 950 [Urine:550; Drains:300]   Intake/Output this shift:       Intake/Output      09/13 0701 - 09/14 0700 09/14 0701 - 09/15 0700   P.O. 240    I.V. (mL/kg) 2002.5 (19.3)    IV Piggyback 100    Total Intake(mL/kg) 2342.5 (22.6)    Urine (mL/kg/hr) 550    Drains 300    Blood 100    Total Output 950     Net +1392.5             LABORATORY DATA:  Recent Labs  10/08/14 0432  WBC 8.0  HGB 9.5*  HCT 28.4*  PLT 200    Recent Labs  10/08/14 0432  NA 135  K 4.3  CL 102  CO2 26  BUN 14  CREATININE 1.10*  GLUCOSE 124*  CALCIUM 8.7*   Lab Results  Component Value Date   INR 1.13 09/25/2014   INR 1.07 08/22/2012    Recent Radiographic Studies :  Dg Chest 2 View  09/25/2014   CLINICAL DATA:  Preop for left total knee replacement, short of breath, hypertension, sleep apnea  EXAM: CHEST  2 VIEW  COMPARISON:  Chest x-ray of 01/08/2014  FINDINGS: No active infiltrate or effusion is seen. Mediastinal and hilar contours are unremarkable. Mild cardiomegaly is stable. There are degenerative changes throughout the lower thoracic spine.  IMPRESSION: Stable cardiomegaly.  No active lung disease.   Electronically Signed   By: Ivar Drape M.D.    On: 09/25/2014 13:10     Examination:  General appearance: alert, cooperative, no distress and moderately obese  Wound Exam: clean, dry, intact   Drainage:  Moderate amount Serosanguinous exudate in hemovac  Motor Exam: EHL, FHL, Anterior Tibial and Posterior Tibial Intact  Sensory Exam: Superficial Peroneal, Deep Peroneal and Tibial normal  Vascular Exam: Normal  Assessment:    1 Day Post-Op  Procedure(s) (LRB): TOTAL KNEE ARTHROPLASTY (Left)  ADDITIONAL DIAGNOSIS:  Principal Problem:   Primary osteoarthritis of left knee Active Problems:   Severe obesity (BMI >= 40)   S/P total knee replacement using cement  Acute Blood Loss Anemia -asymptomatic   Plan: Physical Therapy as ordered Partial Weight Bearing @ 50% (PWB)  DVT Prophylaxis:  Xarelto, Foot Pumps and TED hose  DISCHARGE PLAN: Skilled Nursing Facility/Rehab-[facility]  DISCHARGE NEEDS: CPM, Walker and 3-in-1 comode seat     Foley out, OOB with PT,monitor lab, will go to [facility] for rehab-doing well   Joni Fears W  10/08/2014 7:55 AM

## 2014-10-08 NOTE — Progress Notes (Signed)
OT Defer SNF/ SIgn off acutely  Pt is Medicare and current D/C plan is SNF. No apparent immediate acute care OT needs, therefore will defer OT to SNF. If OT eval is needed please call Acute Rehab Dept. at [phone] or text page OT at [phone].     Jeri Modena   OTR/L Pager: [phone] Office: [phone] .

## 2014-10-08 NOTE — Progress Notes (Signed)
Orthopedic Tech Progress Note Patient Details:  Diane Daugherty [birth year] [id number]  Patient ID: Diane Daugherty, female   DOB: 05/01/1940, 74 y.o.   MRN: [id number] Placed pt's lle on cpm @-50 DEGREES @1355   [name] 10/08/2014, 1:49 PM

## 2014-10-08 NOTE — Progress Notes (Signed)
   10/08/14 1450  Clinical Encounter Type  Visited With Patient and family together;Health care provider  Visit Type Initial;Spiritual support;Post-op  Referral From [place] responded to a request to visit with the patient. Patient expressed some anxiety and concern about her upcoming transition into rehab, and chaplain offered prayer with patient and daughter. Chaplain support available as needed. Pager - [place], [place] 10/08/2014 2:52 PM

## 2014-10-08 NOTE — Progress Notes (Signed)
Physical Therapy Treatment Patient Details Name: Diane Daugherty MRN: [id number] DOB: 1940/02/28 Today's Date: 10/08/2014    History of Present Illness Pt is a 74 y/o F s/p Lt TKA.  Pt's PMH includes Rt TKA, HTN, muscle spasms of LE, PVD, SOB, Lt carpal tunnel syndrome, urinary frequency.    PT Comments    Mrs. Ledlow was lethargic this session as she had just been given pain medicine; however she was agreeable to therapy.  She ambulated 15 ft in room w/ min guard assist and demonstrated safe technique w/ sit<>stand. She remains motivated to progress as well as she can w/ PT. Pt will benefit from continued skilled PT services to increase functional independence and safety.   Follow Up Recommendations  SNF;Supervision for mobility/OOB     Equipment Recommendations  Other (comment) (TBD at next venue of care)    Recommendations for Other Services       Precautions / Restrictions Precautions Precautions: Knee;Fall Precaution Comments: Reviewed knee precautions Restrictions Weight Bearing Restrictions: Yes LLE Weight Bearing: Partial weight bearing LLE Partial Weight Bearing Percentage or Pounds: 50    Mobility  Bed Mobility Overal bed mobility: Needs Assistance Bed Mobility: Sit to Supine       Sit to supine: Min assist   General bed mobility comments: Min assist managing Bil LEs.    Transfers Overall transfer level: Needs assistance Equipment used: Rolling walker (2 wheeled) Transfers: Sit to/from Stand Sit to Stand: Min assist         General transfer comment: Min assist to stabilize RW.  Pt w/ increased time but w/ good technique w/o verbal cues.  Ambulation/Gait Ambulation/Gait assistance: Min guard Ambulation Distance (Feet): 15 Feet Assistive device: Rolling walker (2 wheeled) Gait Pattern/deviations: Step-to pattern;Decreased stride length;Antalgic;Trunk flexed;Decreased weight shift to left;Decreased stance time - left   Gait velocity interpretation:  Below normal speed for age/gender General Gait Details: Cues to stand upright.  Pt w/ good technique using RW.   Stairs            Wheelchair Mobility    Modified Rankin (Stroke Patients Only)       Balance Overall balance assessment: Needs assistance Sitting-balance support: Bilateral upper extremity supported;Feet supported Sitting balance-Leahy Scale: Fair     Standing balance support: Bilateral upper extremity supported;During functional activity Standing balance-Leahy Scale: Poor                      Cognition Arousal/Alertness: Awake/alert;Lethargic Behavior During Therapy: WFL for tasks assessed/performed Overall Cognitive Status: Within Functional Limits for tasks assessed                      Exercises Total Joint Exercises Ankle Circles/Pumps: AROM;Both;15 reps;Supine Quad Sets: Strengthening;10 reps;Supine;Both Gluteal Sets: Strengthening;Both;10 reps;Supine    General Comments        Pertinent Vitals/Pain Pain Assessment: 0-10 Pain Score: 3  Pain Location: Lt knee Pain Descriptors / Indicators: Sore Pain Intervention(s): Limited activity within patient's tolerance;Monitored during session;Repositioned;Premedicated before session    Home Living                      Prior Function            PT Goals (current goals can now be found in the care plan section) Acute Rehab PT Goals Patient Stated Goal: to go to rehab before home PT Goal Formulation: With patient Time For Goal Achievement: 10/15/14 Potential to Achieve Goals: Good Progress  towards PT goals: Progressing toward goals    Frequency  7X/week    PT Plan Current plan remains appropriate    Co-evaluation             End of Session Equipment Utilized During Treatment: Gait belt Activity Tolerance: Patient limited by fatigue;Patient limited by lethargy Patient left: with call bell/phone within reach;in bed     Time: 5170-0174 PT Time Calculation  (min) (ACUTE ONLY): 25 min  Charges:  $Gait Training: 8-22 mins $Therapeutic Activity: 8-22 mins                    G CodesJoslyn Hy PT, [place] [phone] Pager: [phone] 10/08/2014, 1:19 PM

## 2014-10-08 NOTE — Evaluation (Signed)
Physical Therapy Evaluation Patient Details Name: Diane Daugherty MRN: [id number] DOB: 1940/07/07 Today's Date: 10/08/2014   History of Present Illness  Pt is a 74 y/o F s/p Lt TKA.  Pt's PMH includes Rt TKA, HTN, muscle spasms of LE, PVD, SOB, Lt carpal tunnel syndrome, urinary frequency.  Clinical Impression  Pt is s/p Lt TKA resulting in the deficits listed below (see PT Problem List). Ms. Beazer lives alone and will be most appropriate for d/c to SNF.  She ambulated 10 ft in room w/ min guard before reporting generalized weakness and requested to sit.  See general notes for SpO2.  She required mod assist to stand from bed.  Pt will benefit from skilled PT to increase their independence and safety with mobility to allow discharge to the venue listed below.     Follow Up Recommendations SNF;Supervision for mobility/OOB    Equipment Recommendations  Other (comment) (TBD at next venue of care)    Recommendations for Other Services       Precautions / Restrictions Precautions Precautions: Knee;Fall Precaution Booklet Issued: Yes (comment) Precaution Comments: Reviewed knee precautions Restrictions Weight Bearing Restrictions: Yes LLE Weight Bearing: Partial weight bearing LLE Partial Weight Bearing Percentage or Pounds: 50      Mobility  Bed Mobility Overal bed mobility: Needs Assistance Bed Mobility: Supine to Sit     Supine to sit: Min assist;HOB elevated     General bed mobility comments: HOB elevated and pt w/ heavy use of bed rail.  Cues for technique and min assist managing Lt LE.  Increased time.  Transfers Overall transfer level: Needs assistance Equipment used: Rolling walker (2 wheeled) Transfers: Sit to/from Stand Sit to Stand: Mod assist         General transfer comment: Mod assist to power up to standing.  Pt loses balance posteriorly on first attempt and sits down before successully standing on the second attempt.  Cues to ensure stability prior to  transfering hand to RW during sit>stand.  Ambulation/Gait Ambulation/Gait assistance: Min guard Ambulation Distance (Feet): 10 Feet Assistive device: Rolling walker (2 wheeled) Gait Pattern/deviations: Step-to pattern;Antalgic;Trunk flexed;Decreased weight shift to left;Decreased stride length;Decreased stance time - left   Gait velocity interpretation: Below normal speed for age/gender General Gait Details: Cues for proper sequencing w/ RW and to stand upright.  Pt reports generalized fatigue after ambulating 10 ft and requested to sit.  Stairs            Wheelchair Mobility    Modified Rankin (Stroke Patients Only)       Balance Overall balance assessment: Needs assistance Sitting-balance support: Bilateral upper extremity supported;Feet supported Sitting balance-Leahy Scale: Fair     Standing balance support: Bilateral upper extremity supported;During functional activity Standing balance-Leahy Scale: Poor Standing balance comment: Relies on RW for support                             Pertinent Vitals/Pain Pain Assessment: 0-10 Pain Score: 6  Pain Location: Lt knee Pain Descriptors / Indicators: Aching;Grimacing;Guarding;Moaning Pain Intervention(s): Limited activity within patient's tolerance;Monitored during session;Repositioned    Home Living Family/patient expects to be discharged to:: Skilled nursing facility Living Arrangements: Alone                    Prior Function Level of Independence: Independent with assistive device(s)         Comments: PTA used cane in community only.  Hand Dominance        Extremity/Trunk Assessment   Upper Extremity Assessment: Generalized weakness           Lower Extremity Assessment: LLE deficits/detail   LLE Deficits / Details: weakness and limited ROM as expected s/p Lt TKA     Communication   Communication: No difficulties  Cognition Arousal/Alertness: Awake/alert Behavior  During Therapy: WFL for tasks assessed/performed Overall Cognitive Status: Within Functional Limits for tasks assessed                      General Comments General comments (skin integrity, edema, etc.): Pt on RA and SpO2 remained above 90% w/ education on proper breathing technique.  RN notified that O2 has not been on (although pt wearing [place]) and RN requested 2LPM via [place] at end of session.    Exercises Total Joint Exercises Ankle Circles/Pumps: AROM;Both;15 reps;Supine Quad Sets: Strengthening;10 reps;Supine;Both Knee Flexion: AROM;5 reps;Left;Seated Goniometric ROM: 0-78      Assessment/Plan    PT Assessment Patient needs continued PT services  PT Diagnosis Difficulty walking;Abnormality of gait;Generalized weakness;Acute pain   PT Problem List Decreased range of motion;Decreased strength;Decreased activity tolerance;Decreased balance;Decreased mobility;Decreased knowledge of use of DME;Decreased safety awareness;Decreased knowledge of precautions;Decreased skin integrity;Pain;Obesity  PT Treatment Interventions DME instruction;Gait training;Stair training;Functional mobility training;Therapeutic activities;Therapeutic exercise;Balance training;Neuromuscular re-education;Patient/family education;Modalities   PT Goals (Current goals can be found in the Care Plan section) Acute Rehab PT Goals Patient Stated Goal: to go to rehab before home PT Goal Formulation: With patient Time For Goal Achievement: 10/15/14 Potential to Achieve Goals: Good    Frequency 7X/week   Barriers to discharge Decreased caregiver support Pt lives alone    Co-evaluation               End of Session Equipment Utilized During Treatment: Gait belt;Oxygen Activity Tolerance: Patient limited by pain;Patient limited by fatigue Patient left: in chair;with call bell/phone within reach Nurse Communication: Mobility status;Precautions;Weight bearing status         Time: 2542-7062 PT Time  Calculation (min) (ACUTE ONLY): 35 min   Charges:   PT Evaluation $Initial PT Evaluation Tier I: 1 Procedure PT Treatments $Therapeutic Exercise: 8-22 mins   PT G Codes:        Joslyn Hy PT, DPT [phone] Pager: [phone] 10/08/2014, 10:04 AM

## 2014-10-08 NOTE — Care Management Note (Signed)
Case Management Note  Patient Details  Name: Diane Daugherty MRN: [id number] Date of Birth: [birth year]  Subjective/Objective:              S/p left total knee arthroplasty      Action/Plan: PT recommended SNF, referral made to [place], [place] working on SNF placement.   Expected Discharge Date:                  Expected Discharge Plan:  Skilled Nursing Facility  In-House Referral:  Clinical Social Work  Discharge planning Services  CM Consult  Post Acute Care Choice:  NA Choice offered to:     DME Arranged:    DME Agency:     HH Arranged:    [place] Agency:     Status of Service:  Completed, signed off  Medicare Important Message Given:    Date Medicare IM Given:    Medicare IM give by:    Date Additional Medicare IM Given:    Additional Medicare Important Message give by:     If discussed at [place] of Stay Meetings, dates discussed:    Additional Comments:  Nila Nephew, RN 10/08/2014, 3:46 PM

## 2014-10-09 DIAGNOSIS — M1712 Unilateral primary osteoarthritis, left knee: Secondary | ICD-10-CM | POA: Diagnosis not present

## 2014-10-09 LAB — CBC
HEMATOCRIT: 28.2 % — AB (ref 36.0–46.0)
HEMOGLOBIN: 9.3 g/dL — AB (ref 12.0–15.0)
MCH: 30.9 pg (ref 26.0–34.0)
MCHC: 33 g/dL (ref 30.0–36.0)
MCV: 93.7 fL (ref 78.0–100.0)
Platelets: 167 10*3/uL (ref 150–400)
RBC: 3.01 MIL/uL — AB (ref 3.87–5.11)
RDW: 14.8 % (ref 11.5–15.5)
WBC: 9.3 10*3/uL (ref 4.0–10.5)

## 2014-10-09 LAB — BASIC METABOLIC PANEL
ANION GAP: 7 (ref 5–15)
BUN: 19 mg/dL (ref 6–20)
CO2: 28 mmol/L (ref 22–32)
Calcium: 8.6 mg/dL — ABNORMAL LOW (ref 8.9–10.3)
Chloride: 100 mmol/L — ABNORMAL LOW (ref 101–111)
Creatinine, Ser: 1.12 mg/dL — ABNORMAL HIGH (ref 0.44–1.00)
GFR calc Af Amer: 55 mL/min — ABNORMAL LOW (ref >60)
GFR calc non Af Amer: 48 mL/min — ABNORMAL LOW (ref >60)
GLUCOSE: 106 mg/dL — AB (ref 65–99)
POTASSIUM: 4.2 mmol/L (ref 3.5–5.1)
Sodium: 135 mmol/L (ref 135–145)

## 2014-10-09 MED ORDER — RIVAROXABAN 10 MG PO TABS
10.0000 mg | ORAL_TABLET | Freq: Every day | ORAL | Status: DC
Start: 2014-10-09 — End: 2014-10-23

## 2014-10-09 MED ORDER — HYDROMORPHONE HCL 2 MG PO TABS: 2.0000 mg | ORAL_TABLET | ORAL | Status: DC | PRN

## 2014-10-09 NOTE — Progress Notes (Signed)
Patient ID: Diane Daugherty, female   DOB: [birth year], 74 y.o.   MRN: [id number] PATIENT ID: Diane Daugherty        MRN:  [id number]          DOB/AGE: 09/22/1940 / 74 y.o.    Joni Fears, MD   Biagio Borg, PA-C [address] [place], [place]  [address]                             [phone]   PROGRESS NOTE  Subjective:  negative for Chest Pain  negative for Shortness of Breath  negative for Nausea/Vomiting   negative for Calf Pain    Tolerating Diet: yes         Patient reports pain as mild and moderate.     Doing well  Objective: Vital signs in last 24 hours:   Patient Vitals for the past 24 hrs:  BP Temp Temp src Pulse Resp SpO2  10/09/14 1000 (!) 109/50 mmHg - - 84 - -  10/09/14 0534 (!) 131/45 mmHg 98.2 F (36.8 C) Oral 70 18 99 %  10/08/14 2037 (!) 118/42 mmHg - - 72 18 98 %  10/08/14 1345 (!) 116/53 mmHg 97.8 F (36.6 C) - (!) 58 18 100 %      Intake/Output from previous day:   09/14 0701 - 09/15 0700 In: 360 [P.O.:360] Out: 1700 [Urine:1550; Drains:150]   Intake/Output this shift:       Intake/Output      09/14 0701 - 09/15 0700 09/15 0701 - 09/16 0700   P.O. 360    I.V. (mL/kg)     IV Piggyback     Total Intake(mL/kg) 360 (3.5)    Urine (mL/kg/hr) 1550 (0.6)    Drains 150 (0.1)    Blood     Total Output 1700     Net -1340          Urine Occurrence 3 x       LABORATORY DATA:  Recent Labs  10/08/14 0432 10/09/14 0517  WBC 8.0 9.3  HGB 9.5* 9.3*  HCT 28.4* 28.2*  PLT 200 167    Recent Labs  10/08/14 0432 10/09/14 0517  NA 135 135  K 4.3 4.2  CL 102 100*  CO2 26 28  BUN 14 19  CREATININE 1.10* 1.12*  GLUCOSE 124* 106*  CALCIUM 8.7* 8.6*   Lab Results  Component Value Date   INR 1.13 09/25/2014   INR 1.07 08/22/2012    Recent Radiographic Studies :  Dg Chest 2 View  09/25/2014   CLINICAL DATA:  Preop for left total knee replacement, short of breath, hypertension, sleep apnea  EXAM: CHEST  2 VIEW  COMPARISON:  Chest x-ray  of 01/08/2014  FINDINGS: No active infiltrate or effusion is seen. Mediastinal and hilar contours are unremarkable. Mild cardiomegaly is stable. There are degenerative changes throughout the lower thoracic spine.  IMPRESSION: Stable cardiomegaly.  No active lung disease.   Electronically Signed   By: Ivar Drape M.D.   On: 09/25/2014 13:10     Examination:  Wound Exam: clean, dry, intact   Drainage:  None: wound tissue dry  Motor Exam: EHL, FHL, Anterior Tibial and Posterior Tibial Intact  Sensory Exam: Superficial Peroneal, Deep Peroneal and Tibial normal  Vascular Exam: Left posterior tibial artery has trace pulse  Assessment:    2 Days Post-Op  Procedure(s) (LRB): TOTAL KNEE ARTHROPLASTY (Left)  ADDITIONAL DIAGNOSIS:  Principal Problem:   Primary osteoarthritis of left knee Active Problems:   Severe obesity (BMI >= 40)   S/P total knee replacement using cement  Sleep Apnea   Plan: Physical Therapy as ordered Partial Weight Bearing @ 50% (PWB)  DVT Prophylaxis:  Xarelto, Foot Pumps and TED hose  DISCHARGE PLAN: Skilled Nursing Facility/Rehab tomorrow  DISCHARGE NEEDS: HHPT, CPM, Walker and 3-in-1 comode seat        [name]  10/09/2014 11:07 AM

## 2014-10-09 NOTE — Progress Notes (Signed)
Physical Therapy Treatment Patient Details Name: Diane Daugherty MRN: [id number] DOB: Feb 25, 1940 Today's Date: 10/09/2014    History of Present Illness Pt is a 74 y/o F s/p Lt TKA.  Pt's PMH includes Rt TKA, HTN, muscle spasms of LE, PVD, SOB, Lt carpal tunnel syndrome, urinary frequency.    PT Comments    [name] tolerated progression of therapeutic exercises this session and progressing well w/ therapy.  Pt will benefit from continued skilled PT services to increase functional independence and safety.  Follow Up Recommendations  SNF;Supervision for mobility/OOB     Equipment Recommendations  Other (comment) (TBD at next venue of care)    Recommendations for Other Services       Precautions / Restrictions Precautions Precautions: Knee;Fall Precaution Comments: Reviewed knee precautions Restrictions Weight Bearing Restrictions: Yes LLE Weight Bearing: Partial weight bearing LLE Partial Weight Bearing Percentage or Pounds: 50    Mobility  Bed Mobility               General bed mobility comments: Pt sitting in recilner upon PT arrival  Transfers Overall transfer level: Needs assistance Equipment used: Rolling walker (2 wheeled) Transfers: Sit to/from Stand Sit to Stand: Min assist         General transfer comment: Min assist to stabilize RW.  Pt w/ increased time.   Ambulation/Gait Ambulation/Gait assistance: Min guard Ambulation Distance (Feet): 25 Feet Assistive device: Rolling walker (2 wheeled) Gait Pattern/deviations: Step-to pattern;Decreased stance time - left;Decreased stride length;Antalgic;Trunk flexed;Decreased step length - right   Gait velocity interpretation: <1.8 ft/sec, indicative of risk for recurrent falls General Gait Details: Cues to stand upright as she has tendency to flex trunk.   Stairs            Wheelchair Mobility    Modified Rankin (Stroke Patients Only)       Balance Overall balance assessment: Needs  assistance Sitting-balance support: Feet supported;Bilateral upper extremity supported Sitting balance-Leahy Scale: Good     Standing balance support: Bilateral upper extremity supported;During functional activity Standing balance-Leahy Scale: Fair                      Cognition Arousal/Alertness: Awake/alert Behavior During Therapy: WFL for tasks assessed/performed Overall Cognitive Status: Within Functional Limits for tasks assessed                      Exercises Total Joint Exercises Ankle Circles/Pumps: AROM;Both;15 reps;Seated Quad Sets: Strengthening;Both;Seated;5 reps Short Arc Quad: AROM;Left;5 reps;Seated Straight Leg Raises: AAROM;Left;5 reps;Seated    General Comments General comments (skin integrity, edema, etc.): Pt requested to sit on BSC at end of session to attempt to have BM.  Instructed pt to pull call bell once done.  Nurse tech notified.      Pertinent Vitals/Pain Pain Assessment: Faces Faces Pain Scale: Hurts even more Pain Location: Lt knee Pain Descriptors / Indicators: Grimacing;Guarding Pain Intervention(s): Limited activity within patient's tolerance;Monitored during session;Repositioned;Patient requesting pain meds-RN notified;RN gave pain meds during session    Home Living                      Prior Function            PT Goals (current goals can now be found in the care plan section) Acute Rehab PT Goals Patient Stated Goal: to go to rehab before home PT Goal Formulation: With patient Time For Goal Achievement: 10/15/14 Potential to Achieve Goals: Good Progress  towards PT goals: Progressing toward goals    Frequency  7X/week    PT Plan Current plan remains appropriate    Co-evaluation             End of Session Equipment Utilized During Treatment: Gait belt Activity Tolerance: Patient tolerated treatment well Patient left: with call bell/phone within reach;Other (comment) (on BSC (see general  notes))     Time: 1031-2811 PT Time Calculation (min) (ACUTE ONLY): 18 min  Charges:  $Therapeutic Exercise: 8-22 mins                    G Codes:      Joslyn Hy PT, [place] [phone] Pager: [phone] 10/09/2014, 4:56 PM

## 2014-10-09 NOTE — Progress Notes (Signed)
Patient stated to RT that she would get the nurse to call RT when she is ready to wear CPAP. RN is aware.

## 2014-10-09 NOTE — Discharge Summary (Signed)
Joni Fears, MD   Biagio Borg, PA-C [address] [place], [place]  [address]                             [phone]  PATIENT ID: ARLYNE BRANDES        MRN:  [id number]          DOB/AGE: 1940/04/09 / 74 y.o.    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 Hypertension     takes Hyzaar daily  . Muscle spasms of lower extremity     takes Baclofen daily prn  . Peripheral vascular disease   . Shortness of breath     with exertion;Albuterol prn  . Carpal tunnel syndrome   . Joint pain     "all over"  . Joint swelling   . Urinary frequency   . Nocturia   . History of blood transfusion     "w/one of my back OR's"  . Cataract     immature on right  . PFO (patent foramen ovale)     positive saline bubble study 05/12/09 [facility])  . Carpal tunnel syndrome     left  . PONV (postoperative nausea and vomiting)     woke up during hysterectomy  . Family history of anesthesia complication     daughter gets sick  . Pneumonia 2010/ 2011  . OSA on CPAP   . Chronic bronchitis     "get it most years" (10/08/2014)  . Anemia 1970's    "after childbirth"  . Arthritis     "feels like all over"    PROCEDURE: Procedure(s): TOTAL KNEE ARTHROPLASTY Left on 10/07/2014  CONSULTS: none     HISTORY: Vaneta is a very pleasant 75 year old African American female who is seen today for evaluation of her left knee. She has been seen several times for her left knee which has shown degenerative changes for some time now. She has had corticosteroid injections to the left knee as well as Euflexxa  injections. She has had benefit from her last set of injections in June and Julyof 2015. She did have another corticosteroid injection in January 2016 which was only somewhat beneficial. Her symptoms have worsened to the point nowwhere she is having pain with every step. She has nighttime pain also. Her activities of daily living are very difficult because of her left knee pain.At times she has to use a cane intermittently. Her symptoms have gotten to the point where it is so painful that she would like to consider a total knee replacement.  HOSPITAL COURSE:  RENALDA LOCKLIN is a 74 y.o. 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  After appropriate laboratory studies were obtained  they were taken to the operating room on 10/07/2014 and underwent  Procedure(s): TOTAL KNEE ARTHROPLASTY  Left.   They were given perioperative antibiotics:  Anti-infectives    Start     Dose/Rate Route Frequency Ordered Stop   10/07/14 1800  ceFAZolin (ANCEF)  IVPB 2 g/50 mL premix     2 g 100 mL/hr over 30 Minutes Intravenous Every 6 hours 10/07/14 1626 10/08/14 0513   10/07/14 0817  ceFAZolin (ANCEF) IVPB 2 g/50 mL premix     2 g 100 mL/hr over 30 Minutes Intravenous On call to O.R. 10/07/14 1610 10/07/14 1005    .  Tolerated the procedure well.  Placed with a foley intraoperatively.     Toradol was given post op.  POD #1, allowed out of bed to a chair.  PT for ambulation and exercise program.  Foley D/C'd in morning.  IV saline locked.  O2 discontionued.  POD #2, continued PT and ambulation.   Hemovac pulled.  POD #3, continued PT and ambulation.   The remainder of the hospital course was dedicated to ambulation and strengthening.   The patient was discharged on 3 Days Post-Op in  Stable condition.  Blood products given:none  DIAGNOSTIC STUDIES: Recent vital signs:  Patient Vitals for the past 24 hrs:  BP Temp Temp src Pulse Resp SpO2    10/10/14 1029 103/60 mmHg - - - - -  10/10/14 0700 (!) 120/53 mmHg 98.8 F (37.1 C) Oral 90 16 96 %  10/10/14 0153 - - - 90 18 95 %  10/09/14 2038 (!) 101/57 mmHg 99.5 F (37.5 C) Oral 91 19 96 %  10/09/14 1511 (!) 122/52 mmHg 98.2 F (36.8 C) Oral 88 18 -       Recent laboratory studies:  Recent Labs  10/08/14 0432 10/09/14 0517 10/10/14 0550  WBC 8.0 9.3 9.1  HGB 9.5* 9.3* 9.5*  HCT 28.4* 28.2* 28.2*  PLT 200 167 169    Recent Labs  10/08/14 0432 10/09/14 0517 10/10/14 0550  NA 135 135 136  K 4.3 4.2 4.1  CL 102 100* 99*  CO2 26 28 31   BUN 14 19 17   CREATININE 1.10* 1.12* 1.13*  GLUCOSE 124* 106* 116*  CALCIUM 8.7* 8.6* 8.9   Lab Results  Component Value Date   INR 1.13 09/25/2014   INR 1.07 08/22/2012     Recent Radiographic Studies :  Dg Chest 2 View  09/25/2014   CLINICAL DATA:  Preop for left total knee replacement, short of breath, hypertension, sleep apnea  EXAM: CHEST  2 VIEW  COMPARISON:  Chest x-ray of 01/08/2014  FINDINGS: No active infiltrate or effusion is seen. Mediastinal and hilar contours are unremarkable. Mild cardiomegaly is stable. There are degenerative changes throughout the lower thoracic spine.  IMPRESSION: Stable cardiomegaly.  No active lung disease.   Electronically Signed   By: Ivar Drape M.D.   On: 09/25/2014 13:10    DISCHARGE INSTRUCTIONS:     Discharge Instructions    CPM    Complete by:  As directed   Continuous passive motion machine (CPM):      Use the CPM from 0 to 60 degrees for 6-8 hours per day.      You may increase by 5-10 per day.  You may break it up into 2 or 3 sessions per day.      Use CPM for 3-4 weeks or until you are told to stop.     Call MD / Call 911    Complete by:  As directed   If you experience chest pain or shortness of breath, CALL 911 and be transported to the hospital emergency room.  If you develope a fever above 101 F, pus (white drainage) or increased drainage or redness at  the wound, or calf  pain, call your surgeon's office.     Change dressing    Complete by:  As directed   DO NOT CHANGE YOUR DRESSING.     Constipation Prevention    Complete by:  As directed   Drink plenty of fluids.  Prune juice may be helpful.  You may use a stool softener, such as Colace (over the counter) 100 mg twice a day.  Use MiraLax (over the counter) for constipation as needed.     Diet general    Complete by:  As directed      Discharge instructions    Complete by:  As directed   [place] items at home which could result in a fall. This includes throw rugs or furniture in walking pathways ICE to the affected joint every three hours while awake for 30 minutes at a time, for at least the first 3-5 days, and then as needed for pain and swelling.  Continue to use ice for pain and swelling. You may notice swelling that will progress down to the foot and ankle.  This is normal after surgery.  Elevate your leg when you are not up walking on it.   Continue to use the breathing machine you got in the hospital (incentive spirometer) which will help keep your temperature down.  It is common for your temperature to cycle up and down following surgery, especially at night when you are not up moving around and exerting yourself.  The breathing machine keeps your lungs expanded and your temperature down.   DIET:  As you were doing prior to hospitalization, we recommend a well-balanced diet.  DRESSING / WOUND CARE / SHOWERING  Keep the surgical dressing until follow up.  The dressing is water proof, so you can shower without any extra covering.  IF THE DRESSING FALLS OFF or the wound gets wet inside, change the dressing with sterile gauze.  Please use good hand washing techniques before changing the dressing.  Do not use any lotions or creams on the incision until instructed by your surgeon.    ACTIVITY  Increase activity slowly as tolerated, but follow the weight bearing  instructions below.   No driving for 6 weeks or until further direction given by your physician.  You cannot drive while taking narcotics.  No lifting or carrying greater than 10 lbs. until further directed by your surgeon. Avoid periods of inactivity such as sitting longer than an hour when not asleep. This helps prevent blood clots.  You may return to work once you are authorized by your doctor.     WEIGHT BEARING   Partial weight bearing with assist device as directed.  50%   EXERCISES  Results after joint replacement surgery are often greatly improved when you follow the exercise, range of motion and muscle strengthening exercises prescribed by your doctor. Safety measures are also important to protect the joint from further injury. Any time any of these exercises cause you to have increased pain or swelling, decrease what you are doing until you are comfortable again and then slowly increase them. If you have problems or questions, call your caregiver or physical therapist for advice.   Rehabilitation is important following a joint replacement. After just a few days of immobilization, the muscles of the leg can become weakened and shrink (atrophy).  These exercises are designed to build up the tone and strength of the thigh and leg muscles and to improve  motion. Often times heat used for twenty to thirty minutes before working out will loosen up your tissues and help with improving the range of motion but do not use heat for the first two weeks following surgery (sometimes heat can increase post-operative swelling).   These exercises can be done on a training (exercise) mat, on the floor, on a table or on a bed. Use whatever works the best and is most comfortable for you.    Use music or television while you are exercising so that the exercises are a pleasant break in your day. This will make your life better with the exercises acting as a break in your routine that you can look forward to.    Perform all exercises about fifteen times, three times per day or as directed.  You should exercise both the operative leg and the other leg as well.   Exercises include:  Quad Sets - Tighten up the muscle on the front of the thigh (Quad) and hold for 5-10 seconds.   Straight Leg Raises - With your knee straight (if you were given a brace, keep it on), lift the leg to 60 degrees, hold for 3 seconds, and slowly lower the leg.  Perform this exercise against resistance later as your leg gets stronger.  Leg Slides: Lying on your back, slowly slide your foot toward your buttocks, bending your knee up off the floor (only go as far as is comfortable). Then slowly slide your foot back down until your leg is flat on the floor again.  Angel Wings: Lying on your back spread your legs to the side as far apart as you can without causing discomfort.  Hamstring Strength:  Lying on your back, push your heel against the floor with your leg straight by tightening up the muscles of your buttocks.  Repeat, but this time bend your knee to a comfortable angle, and push your heel against the floor.  You may put a pillow under the heel to make it more comfortable if necessary.   A rehabilitation program following joint replacement surgery can speed recovery and prevent re-injury in the future due to weakened muscles. Contact your doctor or a physical therapist for more information on knee rehabilitation.    CONSTIPATION  Constipation is defined medically as fewer than three stools per week and severe constipation as less than one stool per week.  Even if you have a regular bowel pattern at home, your normal regimen is likely to be disrupted due to multiple reasons following surgery.  Combination of anesthesia, postoperative narcotics, change in appetite and fluid intake all can affect your bowels.   YOU MUST use at least one of the following options; they are listed in order of increasing strength to get the job done.   They are all available over the counter, and you may need to use some, POSSIBLY even all of these options:    Drink plenty of fluids (prune juice may be helpful) and high fiber foods Colace 100 mg by mouth twice a day  Senokot for constipation as directed and as needed Dulcolax (bisacodyl), take with full glass of water  Miralax (polyethylene glycol) once or twice a day as needed.  If you have tried all these things and are unable to have a bowel movement in the first 3-4 days after surgery call either your surgeon or your primary doctor.    If you experience loose stools or diarrhea, hold the medications until you stool forms back up.  If your symptoms do not get better within 1 week or if they get worse, check with your doctor.  If you experience "the worst abdominal pain ever" or develop nausea or vomiting, please contact the office immediately for further recommendations for treatment.   ITCHING:  If you experience itching with your medications, try taking only a single pain pill, or even half a pain pill at a time.  You can also use Benadryl over the counter for itching or also to help with sleep.   TED HOSE STOCKINGS:  Use stockings on both legs until for at least 2 weeks or as directed by physician office. They may be removed at night for sleeping.  MEDICATIONS:  See your medication summary on the "After Visit Summary" that nursing will review with you.  You may have some home medications which will be placed on hold until you complete the course of blood thinner medication.  It is important for you to complete the blood thinner medication as prescribed.  PRECAUTIONS:  If you experience chest pain or shortness of breath - call 911 immediately for transfer to the hospital emergency department.   If you develop a fever greater that 101 F, purulent drainage from wound, increased redness or drainage from wound, foul odor from the wound/dressing, or calf pain - CONTACT YOUR SURGEON.                                                    FOLLOW-UP APPOINTMENTS:  If you do not already have a post-op appointment, please call the office for an appointment to be seen by your surgeon.  Guidelines for how soon to be seen are listed in your "After Visit Summary", but are typically between 1-4 weeks after surgery.  OTHER INSTRUCTIONS:   Knee Replacement:  Do not place pillow under knee, focus on keeping the knee straight while resting. CPM instructions: 0-90 degrees, 2 hours in the morning, 2 hours in the afternoon, and 2 hours in the evening. Place foam block, curve side up under heel at all times except when in CPM or when walking.  DO NOT modify, tear, cut, or change the foam block in any way.  MAKE SURE YOU:  Understand these instructions.  Get help right away if you are not doing well or get worse.    Thank you for letting us be a part of your medical care team.  It is a privilege we respect greatly.  We hope these instructions will help you stay on track for a fast and full recovery!     Do not put a pillow under the knee. Place it under the heel.    Complete by:  As directed      Driving restrictions    Complete by:  As directed   No driving for 6 weeks     Increase activity slowly as tolerated    Complete by:  As directed      Lifting restrictions    Complete by:  As directed   No lifting for 6 weeks     Partial weight bearing    Complete by:  As directed   % Body Weight:  50%  Laterality:  left  Extremity:  Lower     Patient may shower    Complete by:  As directed   You may shower  over your dressing     TED hose    Complete by:  As directed   Use stockings (TED hose) for 3 weeks on left  leg.  You may remove them at night for sleeping.           DISCHARGE MEDICATIONS:     Medication List    TAKE these medications        acetaminophen 650 MG CR tablet  Commonly known as:  TYLENOL  Take 1,300 mg by mouth daily.     albuterol 108 (90 BASE) MCG/ACT inhaler    Commonly known as:  PROVENTIL HFA;VENTOLIN HFA  Inhale 2 puffs into the lungs every 6 (six) hours as needed for wheezing or shortness of breath.     baclofen 20 MG tablet  Commonly known as:  LIORESAL  Take 10 mg by mouth daily as needed (muscle spasms).     beta carotene w/minerals tablet  Take 1 tablet by mouth daily.     CALCIUM CITRATE + D PO  Take 1 tablet by mouth daily.     fluticasone 50 MCG/ACT nasal spray  Commonly known as:  FLONASE  Place 2 sprays into both nostrils daily as needed for allergies or rhinitis.     HYDROmorphone 2 MG tablet  Commonly known as:  DILAUDID  Take 1-2 tablets (2-4 mg total) by mouth every 4 (four) hours as needed for moderate pain or severe pain (1 - 2 TABLETS Q 4H PRN PAIN).     Krill Oil 300 MG Caps  Take 1 capsule by mouth daily.     losartan-hydrochlorothiazide 100-25 MG per tablet  Commonly known as:  HYZAAR  Take 1 tablet by mouth daily.     rivaroxaban 10 MG Tabs tablet  Commonly known as:  XARELTO  Take 1 tablet (10 mg total) by mouth daily with breakfast.        FOLLOW UP VISIT:   Follow-up Information    Follow up with [facility], Vonna Kotyk, MD. Schedule an appointment as soon as possible for a visit on 10/20/2014.   Specialty:  Orthopedic Surgery   Contact information:   [address] [place] [address] [phone]       DISPOSITION:   Skilled Nursing Facility/Rehab  CONDITION:  Stable   Mike Craze. [place], [place] [phone]  10/10/2014 10:45 AM

## 2014-10-09 NOTE — Progress Notes (Signed)
Physical Therapy Treatment Patient Details Name: Diane Daugherty MRN: [id number] DOB: 1940/11/22 Today's Date: 10/09/2014    History of Present Illness Pt is a 74 y/o F s/p Lt TKA.  Pt's PMH includes Rt TKA, HTN, muscle spasms of LE, PVD, SOB, Lt carpal tunnel syndrome, urinary frequency.    PT Comments    Diane Daugherty remains motivated but quickly fatigues in Bil UEs w/ ambulation 2/2 PWB status.  Pt progressing well and remains appropriate for SNF at d/c.  Pt will benefit from continued skilled PT services to increase functional independence and safety.   Follow Up Recommendations  SNF;Supervision for mobility/OOB     Equipment Recommendations  Other (comment) (TBD at next venue of care)    Recommendations for Other Services       Precautions / Restrictions Precautions Precautions: Knee;Fall Precaution Comments: Reviewed knee precautions Restrictions Weight Bearing Restrictions: Yes LLE Weight Bearing: Partial weight bearing LLE Partial Weight Bearing Percentage or Pounds: 50    Mobility  Bed Mobility Overal bed mobility: Needs Assistance Bed Mobility: Sit to Supine       Sit to supine: Min assist   General bed mobility comments: Min assist managing Bil LEs.    Transfers Overall transfer level: Needs assistance Equipment used: Rolling walker (2 wheeled) Transfers: Sit to/from Stand Sit to Stand: Min assist         General transfer comment: Min assist to stabilize RW.  Pt w/ increased time.   Ambulation/Gait Ambulation/Gait assistance: Min guard Ambulation Distance (Feet): 40 Feet Assistive device: Rolling walker (2 wheeled) Gait Pattern/deviations: Step-to pattern;Antalgic;Trunk flexed;Decreased stride length;Decreased stance time - left;Decreased weight shift to left   Gait velocity interpretation: <1.8 ft/sec, indicative of risk for recurrent falls General Gait Details: Cues to stand upright.  She requires one standing rest break 2/2 fatigue of Bil  UEs.   Stairs            Wheelchair Mobility    Modified Rankin (Stroke Patients Only)       Balance Overall balance assessment: Needs assistance Sitting-balance support: Bilateral upper extremity supported;Feet supported Sitting balance-Leahy Scale: Good     Standing balance support: Bilateral upper extremity supported;During functional activity Standing balance-Leahy Scale: Fair Standing balance comment: Pt able to was hands and brush teeth standing at sink w/ supervision                    Cognition Arousal/Alertness: Awake/alert Behavior During Therapy: WFL for tasks assessed/performed Overall Cognitive Status: Within Functional Limits for tasks assessed                      Exercises Total Joint Exercises Ankle Circles/Pumps: AROM;Both;15 reps;Seated Quad Sets: Strengthening;10 reps;Both;Seated Knee Flexion: AROM;AAROM;Left;5 reps;Seated Goniometric ROM: 2-82    General Comments        Pertinent Vitals/Pain Pain Assessment: Faces Faces Pain Scale: Hurts little more Pain Location: Lt knee Pain Descriptors / Indicators: Grimacing;Guarding Pain Intervention(s): Limited activity within patient's tolerance;Monitored during session;Repositioned    Home Living                      Prior Function            PT Goals (current goals can now be found in the care plan section) Acute Rehab PT Goals Patient Stated Goal: to go to rehab before home PT Goal Formulation: With patient Time For Goal Achievement: 10/15/14 Potential to Achieve Goals: Good Progress towards PT goals:  Progressing toward goals    Frequency  7X/week    PT Plan Current plan remains appropriate    Co-evaluation             End of Session Equipment Utilized During Treatment: Gait belt Activity Tolerance: Patient limited by fatigue Patient left: with call bell/phone within reach;in bed;in CPM     Time: 1660-6301 PT Time Calculation (min) (ACUTE  ONLY): 39 min  Charges:  $Gait Training: 8-22 mins $Therapeutic Exercise: 8-22 mins $Therapeutic Activity: 8-22 mins                    G Codes:      Joslyn Hy PT, DPT [phone] Pager: [phone]  10/09/2014, 9:56 AM

## 2014-10-10 LAB — BASIC METABOLIC PANEL
ANION GAP: 6 (ref 5–15)
BUN: 17 mg/dL (ref 6–20)
CHLORIDE: 99 mmol/L — AB (ref 101–111)
CO2: 31 mmol/L (ref 22–32)
Calcium: 8.9 mg/dL (ref 8.9–10.3)
Creatinine, Ser: 1.13 mg/dL — ABNORMAL HIGH (ref 0.44–1.00)
GFR calc non Af Amer: 47 mL/min — ABNORMAL LOW (ref >60)
GFR, EST AFRICAN AMERICAN: 55 mL/min — AB (ref >60)
GLUCOSE: 116 mg/dL — AB (ref 65–99)
POTASSIUM: 4.1 mmol/L (ref 3.5–5.1)
Sodium: 136 mmol/L (ref 135–145)

## 2014-10-10 LAB — CBC
HEMATOCRIT: 28.2 % — AB (ref 36.0–46.0)
HEMOGLOBIN: 9.5 g/dL — AB (ref 12.0–15.0)
MCH: 31.5 pg (ref 26.0–34.0)
MCHC: 33.7 g/dL (ref 30.0–36.0)
MCV: 93.4 fL (ref 78.0–100.0)
Platelets: 169 10*3/uL (ref 150–400)
RBC: 3.02 MIL/uL — AB (ref 3.87–5.11)
RDW: 14.8 % (ref 11.5–15.5)
WBC: 9.1 10*3/uL (ref 4.0–10.5)

## 2014-10-10 NOTE — Discharge Planning (Signed)
Patient to be discharged to [facility]. Patient updated at bedside.  Facility: [facility] RN report number: [phone] Transportation: car  Lubertha Sayres, [place] [facility] [place] [phone]) and Surgical [phone])

## 2014-10-10 NOTE — Clinical Social Work Placement (Signed)
   CLINICAL SOCIAL WORK PLACEMENT  NOTE  Date:  10/10/2014  Patient Details  Name: Diane Daugherty MRN: [id number] Date of Birth: 29-Dec-1940  Clinical Social Work is seeking post-discharge placement for this patient at the [place] level of care (*CSW will initial, date and re-position this form in  chart as items are completed):  Yes   Patient/family provided with [place] Work Department's list of facilities offering this level of care within the geographic area requested by the patient (or if unable, by the patient's family).  Yes   Patient/family informed of their freedom to choose among providers that offer the needed level of care, that participate in Medicare, Medicaid or managed care program needed by the patient, have an available bed and are willing to accept the patient.  Yes   Patient/family informed of [place]'s ownership interest in [facility] and [facility], as well as of the fact that they are under no obligation to receive care at these facilities.  PASRR submitted to EDS on 10/08/14     PASRR number received on 10/08/14     Existing PASRR number confirmed on  (n/a)     FL2 transmitted to all facilities in geographic area requested by pt/family on 10/08/14     FL2 transmitted to all facilities within larger geographic area on  (n/a)     Patient informed that his/her managed care company has contracts with or will negotiate with certain facilities, including the following:   (yes, [place])     Yes   Patient/family informed of bed offers received.  Patient chooses bed at [facility]     Physician recommends and patient chooses bed at      Patient to be transferred to [facility] on 10/10/14.  Patient to be transferred to facility by car     Patient family notified on 10/10/14 of transfer.  Name of family member notified:  Patient updated at bedside     PHYSICIAN Please sign FL2     Additional Comment:     _______________________________________________ Caroline Sauger, LCSW 10/10/2014, 11:35 AM

## 2014-10-10 NOTE — Progress Notes (Signed)
Patient ID: Diane Daugherty, female   DOB: [birth year], 74 y.o.   MRN: [id number] PATIENT ID: Diane Daugherty        MRN:  [id number]          DOB/AGE: Dec 31, 1940 / 74 y.o.    Diane Fears, MD   Biagio Borg, PA-C [address] [place], [place]  [address]                             [phone]   PROGRESS NOTE  Subjective:  negative for Chest Pain  negative for Shortness of Breath  negative for Nausea/Vomiting   negative for Calf Pain    Tolerating Diet: yes         Patient reports pain as mild.     Had BM and comfortable  Objective: Vital signs in last 24 hours:   Patient Vitals for the past 24 hrs:  BP Temp Temp src Pulse Resp SpO2  10/10/14 0700 (!) 120/53 mmHg 98.8 F (37.1 C) Oral 90 16 96 %  10/10/14 0153 - - - 90 18 95 %  10/09/14 2038 (!) 101/57 mmHg 99.5 F (37.5 C) Oral 91 19 96 %  10/09/14 1511 (!) 122/52 mmHg 98.2 F (36.8 C) Oral 88 18 -  10/09/14 1000 (!) 109/50 mmHg - - 84 - -      Intake/Output from previous day:   09/15 0701 - 09/16 0700 In: 480 [P.O.:480] Out: -    Intake/Output this shift:   09/16 0701 - 09/16 1900 In: 240 [P.O.:240] Out: -    Intake/Output      09/15 0701 - 09/16 0700 09/16 0701 - 09/17 0700   P.O. 480 240   Total Intake(mL/kg) 480 (4.6) 240 (2.3)   Urine (mL/kg/hr)     Drains     Total Output       Net +480 +240        Urine Occurrence 6 x 1 x   Stool Occurrence 2 x 1 x      LABORATORY DATA:  Recent Labs  10/08/14 0432 10/09/14 0517 10/10/14 0550  WBC 8.0 9.3 9.1  HGB 9.5* 9.3* 9.5*  HCT 28.4* 28.2* 28.2*  PLT 200 167 169    Recent Labs  10/08/14 0432 10/09/14 0517 10/10/14 0550  NA 135 135 136  K 4.3 4.2 4.1  CL 102 100* 99*  CO2 26 28 31   BUN 14 19 17   CREATININE 1.10* 1.12* 1.13*  GLUCOSE 124* 106* 116*  CALCIUM 8.7* 8.6* 8.9   Lab Results  Component Value Date   INR 1.13 09/25/2014   INR 1.07 08/22/2012    Recent Radiographic Studies :  Dg Chest 2 View  09/25/2014   CLINICAL DATA:   Preop for left total knee replacement, short of breath, hypertension, sleep apnea  EXAM: CHEST  2 VIEW  COMPARISON:  Chest x-ray of 01/08/2014  FINDINGS: No active infiltrate or effusion is seen. Mediastinal and hilar contours are unremarkable. Mild cardiomegaly is stable. There are degenerative changes throughout the lower thoracic spine.  IMPRESSION: Stable cardiomegaly.  No active lung disease.   Electronically Signed   By: Ivar Drape M.D.   On: 09/25/2014 13:10     Examination:  General appearance: alert, cooperative and no distress  Wound Exam: clean, dry, intact   Drainage:  None: wound tissue dry  Motor Exam: EHL, FHL, Anterior Tibial and Posterior Tibial Intact  Sensory Exam: Superficial  Peroneal, Deep Peroneal and Tibial normal  Vascular Exam: Normal  Assessment:    3 Days Post-Op  Procedure(s) (LRB): TOTAL KNEE ARTHROPLASTY (Left)  ADDITIONAL DIAGNOSIS:  Principal Problem:   Primary osteoarthritis of left knee Active Problems:   Severe obesity (BMI >= 40)   S/P total knee replacement using cement  Acute Blood Loss Anemia-asymptomatic   Plan: Physical Therapy as ordered Partial Weight Bearing @ 50% (PWB)  DVT Prophylaxis:  Xarelto and TED hose  DISCHARGE PLAN: Skilled Nursing Facility/Rehab[facility] today  DISCHARGE NEEDS: HHPT, CPM, Walker and 3-in-1 comode seat   OOB without problem, I with walker-ready for D/C     Diane Daugherty W  10/10/2014 8:53 AM

## 2014-10-10 NOTE — Progress Notes (Signed)
RT placed patient on CPAP. Patient is resting comfortably.

## 2014-10-10 NOTE — Progress Notes (Signed)
Physical Therapy Treatment Patient Details Name: Diane Daugherty MRN: [id number] DOB: Apr 03, 1940 Today's Date: 10/10/2014    History of Present Illness Pt is a 74 y/o F s/p Lt TKA.  Pt's PMH includes Rt TKA, HTN, muscle spasms of LE, PVD, SOB, Lt carpal tunnel syndrome, urinary frequency.    PT Comments    Pt requiring cues for PWB status and proper sequencing with gait.  Con't to recommend SNF for rehab with anticipated d/c today.  Follow Up Recommendations  SNF;Supervision for mobility/OOB     Equipment Recommendations  None recommended by PT    Recommendations for Other Services       Precautions / Restrictions Precautions Precautions: Knee;Fall Precaution Comments: Reviewed knee precautions Restrictions Weight Bearing Restrictions: Yes LLE Weight Bearing: Partial weight bearing LLE Partial Weight Bearing Percentage or Pounds: 50    Mobility  Bed Mobility Overal bed mobility: Needs Assistance Bed Mobility: Supine to Sit     Supine to sit: Min assist;HOB elevated     General bed mobility comments: A with L LE  Transfers Overall transfer level: Needs assistance Equipment used: Rolling walker (2 wheeled) Transfers: Sit to/from Stand Sit to Stand: Min guard         General transfer comment: cues for hand placement from bed and toilet.  Needed MIN A for LE to scoot back in recliner.  Ambulation/Gait Ambulation/Gait assistance: Min guard Ambulation Distance (Feet): 35 Feet Assistive device: Rolling walker (2 wheeled) Gait Pattern/deviations: Step-to pattern   Gait velocity interpretation: Below normal speed for age/gender General Gait Details: Cues for proper sequencing and for PWB L LE.   Stairs            Wheelchair Mobility    Modified Rankin (Stroke Patients Only)       Balance     Sitting balance-Leahy Scale: Good     Standing balance support: During functional activity Standing balance-Leahy Scale: Fair Standing balance comment:  Able to wash hands at sink with min/guard                    Cognition Arousal/Alertness: Awake/alert Behavior During Therapy: WFL for tasks assessed/performed Overall Cognitive Status: Within Functional Limits for tasks assessed                      Exercises      General Comments General comments (skin integrity, edema, etc.): deferred therex due to lunch arriving, but issued new handout due to pt's being lost and reviewed therex while eating.      Pertinent Vitals/Pain Pain Assessment: 0-10 Pain Score: 4  Pain Location: L knee Pain Descriptors / Indicators: Operative site guarding;Grimacing;Guarding Pain Intervention(s): Limited activity within patient's tolerance;Repositioned    Home Living                      Prior Function            PT Goals (current goals can now be found in the care plan section) Acute Rehab PT Goals Patient Stated Goal: to go to rehab before home PT Goal Formulation: With patient Time For Goal Achievement: 10/15/14 Potential to Achieve Goals: Good Progress towards PT goals: Progressing toward goals    Frequency  7X/week    PT Plan Current plan remains appropriate    Co-evaluation             End of Session Equipment Utilized During Treatment: Gait belt Activity Tolerance: Patient tolerated treatment well  Patient left: in chair;with call bell/phone within reach     Time: 1217-1241 PT Time Calculation (min) (ACUTE ONLY): 24 min  Charges:  $Gait Training: 8-22 mins $Therapeutic Activity: 8-22 mins                    G Codes:      SMITH,KAREN LUBECK 10/10/2014, 1:39 PM

## 2014-10-10 NOTE — Care Management Important Message (Signed)
Important Message  Patient Details  Name: Diane Daugherty MRN: [id number] Date of Birth: 08-21-1940   Medicare Important Message Given:  Yes-second notification given    Erenest Rasher, RN 10/10/2014, 11:37 AM

## 2014-10-13 ENCOUNTER — Non-Acute Institutional Stay (SKILLED_NURSING_FACILITY): Payer: Medicare Other | Admitting: Adult Health

## 2014-10-13 ENCOUNTER — Encounter: Payer: Self-pay | Admitting: Adult Health

## 2014-10-13 DIAGNOSIS — J309 Allergic rhinitis, unspecified: Secondary | ICD-10-CM | POA: Diagnosis not present

## 2014-10-13 DIAGNOSIS — I10 Essential (primary) hypertension: Secondary | ICD-10-CM

## 2014-10-13 DIAGNOSIS — D62 Acute posthemorrhagic anemia: Secondary | ICD-10-CM | POA: Diagnosis not present

## 2014-10-13 DIAGNOSIS — M1712 Unilateral primary osteoarthritis, left knee: Secondary | ICD-10-CM

## 2014-10-14 NOTE — Progress Notes (Signed)
Patient ID: Diane Daugherty, female   DOB: [birth year], 74 y.o.   MRN: [id number]    DATE:  10/13/14 MRN:  [id number]  BIRTHDAY: May 31, 1940  Facility:  Nursing Home Location:  [place] Room Number: 076-A  LEVEL OF CARE:  SNF (31)  Contact Information    Name Relation Home Work [place] Daughter   [phone]   Kenneth,Vanessa Daughter  [phone]   [place] Daughter   [phone]      Chief Complaint  Patient presents with  . Hospitalization Follow-up    Osteoarthritis S/P left total knee arthroplasty, allergic rhinitis, hypertension and anemia    HISTORY OF PRESENT ILLNESS:  This is a 74 year old female who was been admitted to [facility] on 10/10/14 from [facility]. She has PMH of hypertension, PVD, carpal tunnel syndrome and urinary frequency. She has osteoarthritis for which she had left total knee arthroplasty on 10/07/14.  She has been admitted for a short-term rehabilitation.  PAST MEDICAL HISTORY:  Past Medical History  Diagnosis Date  . Hypertension     takes Hyzaar daily  . Muscle spasms of lower extremity     takes Baclofen daily prn  . Peripheral vascular disease   . Shortness of breath     with exertion;Albuterol prn  . Carpal tunnel syndrome   . Joint pain     "all over"  . Joint swelling   . Urinary frequency   . Nocturia   . History of blood transfusion     "w/one of my back OR's"  . Cataract     immature on right  . PFO (patent foramen ovale)     positive saline bubble study 05/12/09 [facility])  . Carpal tunnel syndrome     left  . PONV (postoperative nausea and vomiting)     woke up during hysterectomy  . Family history of anesthesia complication     daughter gets sick  . Pneumonia 2010/ 2011  . OSA on CPAP   . Chronic bronchitis     "get it most years" (10/08/2014)  . Anemia 1970's    "after childbirth"  . Arthritis     "feels like all over"     CURRENT  MEDICATIONS: Reviewed  Patient's Medications  New Prescriptions   No medications on file  Previous Medications   ACETAMINOPHEN (TYLENOL) 650 MG CR TABLET    Take 1,300 mg by mouth daily.   ALBUTEROL (PROVENTIL HFA;VENTOLIN HFA) 108 (90 BASE) MCG/ACT INHALER    Inhale 2 puffs into the lungs every 6 (six) hours as needed for wheezing or shortness of breath.   BACLOFEN (LIORESAL) 20 MG TABLET    Take 10 mg by mouth daily as needed (muscle spasms).   BETA CAROTENE W/MINERALS (OCUVITE) TABLET    Take 1 tablet by mouth daily.   CALCIUM CITRATE-VITAMIN D (CALCIUM CITRATE + D PO)    Take 1 tablet by mouth daily.   FLUTICASONE (FLONASE) 50 MCG/ACT NASAL SPRAY    Place 2 sprays into both nostrils daily as needed for allergies or rhinitis.   HYDROMORPHONE (DILAUDID) 2 MG TABLET    Take 1-2 tablets (2-4 mg total) by mouth every 4 (four) hours as needed for moderate pain or severe pain (1 - 2 TABLETS Q 4H PRN PAIN).   KRILL OIL 300 MG CAPS    Take 1 capsule by mouth daily.   LOSARTAN-HYDROCHLOROTHIAZIDE (HYZAAR) 100-25 MG PER TABLET    Take 1  tablet by mouth daily.   RIVAROXABAN (XARELTO) 10 MG TABS TABLET    Take 1 tablet (10 mg total) by mouth daily with breakfast.  Modified Medications   No medications on file  Discontinued Medications   No medications on file     Allergies  Allergen Reactions  . Levaquin [Levofloxacin] Swelling  . Morphine And Related Nausea Only    nausea  . Other     Darvocet-vomiting  . Oxycodone Nausea Only  . Naprosyn [Naproxen] Itching and Rash     REVIEW OF SYSTEMS:  GENERAL: no change in appetite, no fatigue, no weight changes, no fever, chills or weakness EYES: Denies change in vision, dry eyes, eye pain, itching or discharge EARS: Denies change in hearing, ringing in ears, or earache NOSE: Denies nasal congestion or epistaxis MOUTH and THROAT: Denies oral discomfort, gingival pain or bleeding, pain from teeth or hoarseness   RESPIRATORY: no cough, SOB, DOE,  wheezing, hemoptysis CARDIAC: no chest pain, edema or palpitations GI: no abdominal pain, diarrhea, constipation, heart burn, nausea or vomiting   GU: Denies dysuria, frequency, hematuria, incontinence, or discharge PSYCHIATRIC: Denies feeling of depression or anxiety. No report of hallucinations, insomnia, paranoia, or agitation   PHYSICAL EXAMINATION  GENERAL APPEARANCE: Well nourished. In no acute distress. Obese SKIN:  Left knee surgical site with Aquacel dressing, dry, no erythema HEAD: Normal in size and contour. No evidence of trauma EYES: Lids open and close normally. No blepharitis, entropion or ectropion. PERRL. Conjunctivae are clear and sclerae are white. Lenses are without opacity EARS: Pinnae are normal. Patient hears normal voice tunes of the examiner MOUTH and THROAT: Lips are without lesions. Oral mucosa is moist and without lesions. Tongue is normal in shape, size, and color and without lesions NECK: supple, trachea midline, no neck masses, no thyroid tenderness, no thyromegaly LYMPHATICS: no LAN in the neck, no supraclavicular LAN RESPIRATORY: breathing is even & unlabored, BS CTAB CARDIAC: RRR, no murmur,no extra heart sounds, no edema GI: abdomen soft, normal BS, no masses, no tenderness, no hepatomegaly, no splenomegaly EXTREMITIES:  Able to move 4 extremities PSYCHIATRIC: Alert and oriented X 3. Affect and behavior are appropriate  LABS/RADIOLOGY: Labs reviewed: Basic Metabolic Panel:  Recent Labs  10/08/14 0432 10/09/14 0517 10/10/14 0550  NA 135 135 136  K 4.3 4.2 4.1  CL 102 100* 99*  CO2 26 28 31   GLUCOSE 124* 106* 116*  BUN 14 19 17   CREATININE 1.10* 1.12* 1.13*  CALCIUM 8.7* 8.6* 8.9   Liver Function Tests:  Recent Labs  09/25/14 1014  AST 17  ALT 13*  ALKPHOS 69  BILITOT 0.5  PROT 7.8  ALBUMIN 3.6    CBC:  Recent Labs  09/25/14 1014 10/08/14 0432 10/09/14 0517 10/10/14 0550  WBC 7.3 8.0 9.3 9.1  NEUTROABS 4.5  --   --   --    HGB 10.9* 9.5* 9.3* 9.5*  HCT 32.8* 28.4* 28.2* 28.2*  MCV 93.2 92.8 93.7 93.4  PLT 252 200 167 169   CBG:  Recent Labs  10/07/14 1158  GLUCAP 115*    Dg Chest 2 View  09/25/2014   CLINICAL DATA:  Preop for left total knee replacement, short of breath, hypertension, sleep apnea  EXAM: CHEST  2 VIEW  COMPARISON:  Chest x-ray of 01/08/2014  FINDINGS: No active infiltrate or effusion is seen. Mediastinal and hilar contours are unremarkable. Mild cardiomegaly is stable. There are degenerative changes throughout the lower thoracic spine.  IMPRESSION: Stable cardiomegaly.  No active lung disease.   Electronically Signed   By: Ivar Drape M.D.   On: 09/25/2014 13:10    ASSESSMENT/PLAN:   Osteoarthritis S/P left total knee arthroplasty - for rehabilitation; LLE partial weightbearing; continue Tylenol 650 mg CR 2 tabs= 1300 mg by mouth daily and Dilaudid 2 mg 1-2 tabs by mouth every 4 hours when necessary for pain; baclofen 20 mg 1 tab by mouth daily when necessary; baclofen 20 mg 1 tab by mouth daily when necessary for spasm; Xarelto 10 mg 1 tab by mouth daily for DVT prophylaxis; follow-up with Dr. Darletta Moll, or to surgeon, on 10/20/14  Hypertension - well controlled; continue Hyzaar 100-25 mg 1 tab by mouth daily  Anemia, acute blood loss - hemoglobin 9.5; check CBC  Allergic rhinitis - continue Flonase 50 g/ACT 2 sprays into bilateral nostrils daily when necessary     Goals of care:  Short-term rehabilitation     [facility], [place]

## 2014-10-15 ENCOUNTER — Non-Acute Institutional Stay (SKILLED_NURSING_FACILITY): Payer: Medicare Other | Admitting: Internal Medicine

## 2014-10-15 DIAGNOSIS — J309 Allergic rhinitis, unspecified: Secondary | ICD-10-CM | POA: Diagnosis not present

## 2014-10-15 DIAGNOSIS — I10 Essential (primary) hypertension: Secondary | ICD-10-CM

## 2014-10-15 DIAGNOSIS — D62 Acute posthemorrhagic anemia: Secondary | ICD-10-CM | POA: Diagnosis not present

## 2014-10-15 DIAGNOSIS — M1712 Unilateral primary osteoarthritis, left knee: Secondary | ICD-10-CM | POA: Diagnosis not present

## 2014-10-15 DIAGNOSIS — K5900 Constipation, unspecified: Secondary | ICD-10-CM | POA: Diagnosis not present

## 2014-10-15 DIAGNOSIS — R2681 Unsteadiness on feet: Secondary | ICD-10-CM | POA: Diagnosis not present

## 2014-10-15 DIAGNOSIS — N289 Disorder of kidney and ureter, unspecified: Secondary | ICD-10-CM | POA: Diagnosis not present

## 2014-10-15 NOTE — Progress Notes (Signed)
Patient ID: Diane Daugherty, female   DOB: [birth year], 74 y.o.   MRN: [id number]     [facility]   PCP: TAPPER,DAVID B, MD  Code Status: full code  Allergies  Allergen Reactions  . Levaquin [Levofloxacin] Swelling  . Morphine And Related Nausea Only    nausea  . Other     Darvocet-vomiting  . Oxycodone Nausea Only  . Naprosyn [Naproxen] Itching and Rash    Chief Complaint  Patient presents with  . New Admit To SNF     HPI:  74 y.o. patient is here for short term rehabilitation post hospital admission from 10/07/14-10/10/14 with primary OA of left knee. She underwent left total knee arthroplasty. She is seen in her room today. Her pain is under control with current regimen. She had a bowel movement today. She has been participating in therapy. She has been having runny nose and would like to be started on home regimen prn allegra.   Review of Systems:  Constitutional: Negative for fever, chills, diaphoresis.  HENT: Negative for headache, congestion, nasal discharge.   Eyes: Negative for eye pain, blurred vision, double vision and discharge.  Respiratory: Negative for cough, shortness of breath and wheezing.   Cardiovascular: Negative for chest pain, palpitations, leg swelling.  Gastrointestinal: Negative for heartburn, nausea, vomiting, abdominal pain. Genitourinary: Negative for dysuria, flank pain.  Musculoskeletal: Negative for back pain, falls. Skin: Negative for itching, rash.  Neurological: Negative for dizziness, tingling, focal weakness Psychiatric/Behavioral: Negative for depression   Past Medical History  Diagnosis Date  . Hypertension     takes Hyzaar daily  . Muscle spasms of lower extremity     takes Baclofen daily prn  . Peripheral vascular disease   . Shortness of breath     with exertion;Albuterol prn  . Carpal tunnel syndrome   . Joint pain     "all over"  . Joint swelling   . Urinary frequency   . Nocturia   .  History of blood transfusion     "w/one of my back OR's"  . Cataract     immature on right  . PFO (patent foramen ovale)     positive saline bubble study 05/12/09 [facility])  . Carpal tunnel syndrome     left  . PONV (postoperative nausea and vomiting)     woke up during hysterectomy  . Family history of anesthesia complication     daughter gets sick  . Pneumonia 2010/ 2011  . OSA on CPAP   . Chronic bronchitis     "get it most years" (10/08/2014)  . Anemia 1970's    "after childbirth"  . Arthritis     "feels like all over"   Past Surgical History  Procedure Laterality Date  . Cesarean section  0086; 1963; 1970  . Tibia fracture surgery Right 1990's    "put screw in"  . Knee arthroscopy Right ?2013  . Appendectomy    . Colonoscopy    . Chest tube insertion  80's    d/t pneumothorax  . Total knee arthroplasty Right 08/28/2012    Procedure: TOTAL KNEE ARTHROPLASTY;  Surgeon: Garald Balding, MD;  Location: [place];  Service: Orthopedics;  Laterality: Right;  . Total knee arthroplasty Left 10/07/2014  . Hardware removal Right 1990's    "took screw out of my lower leg ~ 1 yr after putting it in"  . Breast biopsy Right 1990's  . Hemilaminotomy lumbar spine  02/2002  L5; decompression of the L5 and S1 nerve root; synovial cyst excision/notes 06/09/2010  . Posterior lumbar fusion  05/2003    L4-5 and S-1   . Lumbar laminectomy/decompression microdiscectomy  02/2010  . Back surgery    . Fracture surgery    . Abdominal hysterectomy  1970's?  . Total knee arthroplasty Left 10/07/2014    Procedure: TOTAL KNEE ARTHROPLASTY;  Surgeon: Garald Balding, MD;  Location: [place];  Service: Orthopedics;  Laterality: Left;   Social History:   reports that she has never smoked. She has never used smokeless tobacco. She reports that she does not drink alcohol or use illicit drugs.  No family history on file.  Medications:   Medication List       This list is accurate as of: 10/15/14  3:53  PM.  Always use your most recent med list.               acetaminophen 650 MG CR tablet  Commonly known as:  TYLENOL  Take 1,300 mg by mouth daily.     albuterol 108 (90 BASE) MCG/ACT inhaler  Commonly known as:  PROVENTIL HFA;VENTOLIN HFA  Inhale 2 puffs into the lungs every 6 (six) hours as needed for wheezing or shortness of breath.     baclofen 20 MG tablet  Commonly known as:  LIORESAL  Take 10 mg by mouth daily as needed (muscle spasms).     beta carotene w/minerals tablet  Take 1 tablet by mouth daily.     CALCIUM CITRATE + D PO  Take 1 tablet by mouth daily.     fluticasone 50 MCG/ACT nasal spray  Commonly known as:  FLONASE  Place 2 sprays into both nostrils daily as needed for allergies or rhinitis.     HYDROmorphone 2 MG tablet  Commonly known as:  DILAUDID  Take 1-2 tablets (2-4 mg total) by mouth every 4 (four) hours as needed for moderate pain or severe pain (1 - 2 TABLETS Q 4H PRN PAIN).     Krill Oil 300 MG Caps  Take 1 capsule by mouth daily.     losartan-hydrochlorothiazide 100-25 MG per tablet  Commonly known as:  HYZAAR  Take 1 tablet by mouth daily.     rivaroxaban 10 MG Tabs tablet  Commonly known as:  XARELTO  Take 1 tablet (10 mg total) by mouth daily with breakfast.         Physical Exam: Filed Vitals:   10/15/14 1552  BP: 126/79  Pulse: 88  Temp: 97.9 F (36.6 C)  Resp: 18  SpO2: 96%    General- elderly female, obese, in no acute distress Head- normocephalic, atraumatic Nose- normal nasal mucosa, no maxillary or frontal sinus tenderness, clear nasal discharge Throat- moist mucus membrane Eyes- PERRLA, EOMI, no pallor, no icterus, no discharge, normal conjunctiva, normal sclera Neck- no cervical lymphadenopathy Cardiovascular- normal s1,s2, no murmurs, palpable dorsalis pedis and radial pulses, trace left leg edema Respiratory- bilateral clear to auscultation, no wheeze, no rhonchi, no crackles, no use of accessory  muscles Abdomen- bowel sounds present, soft, non tender Musculoskeletal- able to move all 4 extremities, left knee limited range of motion  Neurological- no focal deficit, alert and oriented to person, place and time Skin- warm and dry, left knee surgical incision with aquacel dressing in place Psychiatry- normal mood and affect   Labs reviewed: Basic Metabolic Panel:  Recent Labs  10/08/14 0432 10/09/14 0517 10/10/14 0550  NA 135 135 136  K  4.3 4.2 4.1  CL 102 100* 99*  CO2 26 28 31   GLUCOSE 124* 106* 116*  BUN 14 19 17   CREATININE 1.10* 1.12* 1.13*  CALCIUM 8.7* 8.6* 8.9   Liver Function Tests:  Recent Labs  09/25/14 1014  AST 17  ALT 13*  ALKPHOS 69  BILITOT 0.5  PROT 7.8  ALBUMIN 3.6   No results for input(s): LIPASE, AMYLASE in the last 8760 hours. No results for input(s): AMMONIA in the last 8760 hours. CBC:  Recent Labs  09/25/14 1014 10/08/14 0432 10/09/14 0517 10/10/14 0550  WBC 7.3 8.0 9.3 9.1  NEUTROABS 4.5  --   --   --   HGB 10.9* 9.5* 9.3* 9.5*  HCT 32.8* 28.4* 28.2* 28.2*  MCV 93.2 92.8 93.7 93.4  PLT 252 200 167 169   Cardiac Enzymes: No results for input(s): CKTOTAL, CKMB, CKMBINDEX, TROPONINI in the last 8760 hours. BNP: Invalid input(s): POCBNP CBG:  Recent Labs  10/07/14 1158  GLUCAP 115*    Radiological Exams: Dg Chest 2 View  09/25/2014   CLINICAL DATA:  Preop for left total knee replacement, short of breath, hypertension, sleep apnea  EXAM: CHEST  2 VIEW  COMPARISON:  Chest x-ray of 01/08/2014  FINDINGS: No active infiltrate or effusion is seen. Mediastinal and hilar contours are unremarkable. Mild cardiomegaly is stable. There are degenerative changes throughout the lower thoracic spine.  IMPRESSION: Stable cardiomegaly.  No active lung disease.   Electronically Signed   By: Ivar Drape M.D.   On: 09/25/2014 13:10    Assessment/Plan  Unsteady gait Post left knee replacement surgery. Will have patient work with PT/OT as  tolerated to regain strength and restore function.  Fall precautions are in place.  Left knee Osteoarthritis  S/P left total knee arthroplasty. Will have her work with physical therapy and occupational therapy team to help with gait training and muscle strengthening exercises.fall precautions. Skin care. Encourage to be out of bed. LLE WBAT. Has f/u with orthopedics. Continue dilaudid 2 mg 1-2 tab q4h prn pain, baclofen 10 mg daily prn muscle spasm and xarelto for dvt prophylaxis. Continue ca-vit d supplement  Acute blood loss anemia Post op, monitor h&h  Allergic rhinitis On flonase nasal spray. Start allegra 120 mg daily for now for 3 days then daily as needed only and reassess  Constipation Stable bowel movement, with her on dilaudid add senna s 1 tab qhs prn and monitor  Hypertension  Stable, monitor bp and continue hyzaar 100-25 mg daily  Renal impairment Unclear if acute or chronic, check bmp to assess further   Goals of care: short term rehabilitation   Labs/tests ordered: cbc, bmp next lab  Family/ staff Communication: reviewed care plan with patient and nursing supervisor    Blanchie Serve, MD  [place] [phone] (Monday-Friday 8 am - 5 pm) [phone] (afterhours)

## 2014-10-21 ENCOUNTER — Non-Acute Institutional Stay (SKILLED_NURSING_FACILITY): Payer: Medicare Other | Admitting: Adult Health

## 2014-10-21 DIAGNOSIS — I10 Essential (primary) hypertension: Secondary | ICD-10-CM

## 2014-10-21 DIAGNOSIS — D62 Acute posthemorrhagic anemia: Secondary | ICD-10-CM | POA: Diagnosis not present

## 2014-10-21 DIAGNOSIS — K5900 Constipation, unspecified: Secondary | ICD-10-CM | POA: Diagnosis not present

## 2014-10-21 DIAGNOSIS — M1712 Unilateral primary osteoarthritis, left knee: Secondary | ICD-10-CM

## 2014-10-21 DIAGNOSIS — J309 Allergic rhinitis, unspecified: Secondary | ICD-10-CM | POA: Diagnosis not present

## 2014-10-23 ENCOUNTER — Encounter: Payer: Self-pay | Admitting: Adult Health

## 2014-10-23 NOTE — Progress Notes (Signed)
Patient ID: Diane Daugherty, female   DOB: September 06, 1940, 74 y.o.   MRN: [id number]    DATE:  10/21/14 MRN:  [id number]  BIRTHDAY: [birth year]  Facility:  [facility] Home Location:  [place] Room Number: 270-W  LEVEL OF CARE:  SNF (31)  Contact Information    Name Relation Home Work [place] Daughter   [phone]   Markert,Vanessa Daughter  [phone]   [place] Daughter   [phone]      Chief Complaint  Patient presents with  . Discharge Note    Osteoarthritis S/P left total knee arthroplasty, allergic rhinitis, hypertension, constipation and anemia    HISTORY OF PRESENT ILLNESS:  This is a 74 year old female who is for discharge home with Home health PT for endurance. DME:  Rolling walker. She has been admitted to [facility] on 10/10/14 from [facility]. She has PMH of hypertension, PVD, carpal tunnel syndrome and urinary frequency. She has osteoarthritis for which she had left total knee arthroplasty on 10/07/14.  Patient was admitted to this facility for short-term rehabilitation after the patient's recent hospitalization.  Patient has completed SNF rehabilitation and therapy has cleared the patient for discharge.   PAST MEDICAL HISTORY:  Past Medical History  Diagnosis Date  . Hypertension     takes Hyzaar daily  . Muscle spasms of lower extremity     takes Baclofen daily prn  . Peripheral vascular disease   . Shortness of breath     with exertion;Albuterol prn  . Carpal tunnel syndrome   . Joint pain     "all over"  . Joint swelling   . Urinary frequency   . Nocturia   . History of blood transfusion     "w/one of my back OR's"  . Cataract     immature on right  . PFO (patent foramen ovale)     positive saline bubble study 05/12/09 [facility])  . Carpal tunnel syndrome     left  . PONV (postoperative nausea and vomiting)     woke up during hysterectomy  . Family history of anesthesia  complication     daughter gets sick  . Pneumonia 2010/ 2011  . OSA on CPAP   . Chronic bronchitis     "get it most years" (10/08/2014)  . Anemia 1970's    "after childbirth"  . Arthritis     "feels like all over"     CURRENT MEDICATIONS: Reviewed  Patient's Medications  New Prescriptions   No medications on file  Previous Medications   ACETAMINOPHEN (TYLENOL) 650 MG CR TABLET    Take 1,300 mg by mouth daily.   ALBUTEROL (PROVENTIL HFA;VENTOLIN HFA) 108 (90 BASE) MCG/ACT INHALER    Inhale 2 puffs into the lungs every 6 (six) hours as needed for wheezing or shortness of breath.   BACLOFEN (LIORESAL) 20 MG TABLET    Take 10 mg by mouth daily as needed (muscle spasms).   BETA CAROTENE W/MINERALS (OCUVITE) TABLET    Take 1 tablet by mouth daily.   CALCIUM CITRATE-VITAMIN D (CALCIUM CITRATE + D PO)    Take 1 tablet by mouth daily.   FERROUS SULFATE 325 (65 FE) MG TABLET    Take 325 mg by mouth 2 (two) times daily.   FLUTICASONE (FLONASE) 50 MCG/ACT NASAL SPRAY    Place 2 sprays into both nostrils daily as needed for allergies or rhinitis.   HYDROMORPHONE (DILAUDID) 2 MG TABLET  Take 1-2 tablets (2-4 mg total) by mouth every 4 (four) hours as needed for moderate pain or severe pain (1 - 2 TABLETS Q 4H PRN PAIN).   KRILL OIL 300 MG CAPS    Take 1 capsule by mouth daily.   LOSARTAN-HYDROCHLOROTHIAZIDE (HYZAAR) 100-25 MG PER TABLET    Take 1 tablet by mouth daily.   SENNOSIDES-DOCUSATE SODIUM (SENOKOT-S) 8.6-50 MG TABLET    Take 1 tablet by mouth at bedtime as needed for constipation.  Modified Medications   No medications on file  Discontinued Medications   RIVAROXABAN (XARELTO) 10 MG TABS TABLET    Take 1 tablet (10 mg total) by mouth daily with breakfast.     Allergies  Allergen Reactions  . Levaquin [Levofloxacin] Swelling  . Morphine And Related Nausea Only    nausea  . Other     Darvocet-vomiting  . Oxycodone Nausea Only  . Naprosyn [Naproxen] Itching and Rash     REVIEW  OF SYSTEMS:  GENERAL: no change in appetite, no fatigue, no weight changes, no fever, chills or weakness EYES: Denies change in vision, dry eyes, eye pain, itching or discharge EARS: Denies change in hearing, ringing in ears, or earache NOSE: Denies nasal congestion or epistaxis MOUTH and THROAT: Denies oral discomfort, gingival pain or bleeding, pain from teeth or hoarseness   RESPIRATORY: no cough, SOB, DOE, wheezing, hemoptysis CARDIAC: no chest pain, edema or palpitations GI: no abdominal pain, diarrhea, constipation, heart burn, nausea or vomiting   GU: Denies dysuria, frequency, hematuria, incontinence, or discharge PSYCHIATRIC: Denies feeling of depression or anxiety. No report of hallucinations, insomnia, paranoia, or agitation   PHYSICAL EXAMINATION  GENERAL APPEARANCE: Well nourished. In no acute distress. Obese SKIN:  Left knee surgical site with steri-strips, dry, no erythema HEAD: Normal in size and contour. No evidence of trauma EYES: Lids open and close normally. No blepharitis, entropion or ectropion. PERRL. Conjunctivae are clear and sclerae are white. Lenses are without opacity EARS: Pinnae are normal. Patient hears normal voice tunes of the examiner MOUTH and THROAT: Lips are without lesions. Oral mucosa is moist and without lesions. Tongue is normal in shape, size, and color and without lesions NECK: supple, trachea midline, no neck masses, no thyroid tenderness, no thyromegaly LYMPHATICS: no LAN in the neck, no supraclavicular LAN RESPIRATORY: breathing is even & unlabored, BS CTAB CARDIAC: RRR, no murmur,no extra heart sounds, no edema GI: abdomen soft, normal BS, no masses, no tenderness, no hepatomegaly, no splenomegaly EXTREMITIES:  Able to move 4 extremities PSYCHIATRIC: Alert and oriented X 3. Affect and behavior are appropriate  LABS/RADIOLOGY: Labs reviewed: 10/16/14  WBC 10.0 hemoglobin 8.9 hematocrit 27.0 MCV 94.1 platelet 273 sodium 137 potassium 4.7  glucose 108 BUN 20 creatinine 1.24 calcium 9.8 Basic Metabolic Panel:  Recent Labs  10/08/14 0432 10/09/14 0517 10/10/14 0550  NA 135 135 136  K 4.3 4.2 4.1  CL 102 100* 99*  CO2 26 28 31   GLUCOSE 124* 106* 116*  BUN 14 19 17   CREATININE 1.10* 1.12* 1.13*  CALCIUM 8.7* 8.6* 8.9   Liver Function Tests:  Recent Labs  09/25/14 1014  AST 17  ALT 13*  ALKPHOS 69  BILITOT 0.5  PROT 7.8  ALBUMIN 3.6    CBC:  Recent Labs  09/25/14 1014 10/08/14 0432 10/09/14 0517 10/10/14 0550  WBC 7.3 8.0 9.3 9.1  NEUTROABS 4.5  --   --   --   HGB 10.9* 9.5* 9.3* 9.5*  HCT 32.8* 28.4* 28.2*  28.2*  MCV 93.2 92.8 93.7 93.4  PLT 252 200 167 169   CBG:  Recent Labs  10/07/14 1158  GLUCAP 115*    Dg Chest 2 View  09/25/2014   CLINICAL DATA:  Preop for left total knee replacement, short of breath, hypertension, sleep apnea  EXAM: CHEST  2 VIEW  COMPARISON:  Chest x-ray of 01/08/2014  FINDINGS: No active infiltrate or effusion is seen. Mediastinal and hilar contours are unremarkable. Mild cardiomegaly is stable. There are degenerative changes throughout the lower thoracic spine.  IMPRESSION: Stable cardiomegaly.  No active lung disease.   Electronically Signed   By: Ivar Drape M.D.   On: 09/25/2014 13:10    ASSESSMENT/PLAN:   Osteoarthritis S/P left total knee arthroplasty - for home health PT for endurance; LLE WBAT; continue Tylenol 650 mg CR 2 tabs= 1300 mg by mouth daily and Dilaudid 2 mg 1-2 tabs by mouth every 4 hours when necessary for pain; baclofen 20 mg 1 tab by mouth daily when necessary; baclofen 20 mg 1 tab by mouth daily when necessary for spasm; Xarelto 10 mg was discontinued; follow-up with Dr. Darletta Moll, ortho surgeon  Hypertension - well controlled; continue Hyzaar 100-25 mg 1 tab by mouth daily  Anemia, acute blood loss - hemoglobin 8.9; continue ferrous sulfate 325 mg 1 tab by mouth twice a day  Allergic rhinitis - continue Flonase 50 g/ACT 2 sprays into  bilateral nostrils daily when necessary      I have filled out patient's discharge paperwork and written prescriptions.  Patient will receive home health PT.  DME provided:  Rolling walker  Total discharge time: Greater than 30 minutes  Discharge time involved coordination of the discharge process with social worker, nursing staff and therapy department. Medical justification for home health services/DME verified.    [facility], NP [employer] [phone]

## 2014-10-24 DIAGNOSIS — Z471 Aftercare following joint replacement surgery: Secondary | ICD-10-CM | POA: Diagnosis not present

## 2014-10-24 DIAGNOSIS — I10 Essential (primary) hypertension: Secondary | ICD-10-CM

## 2014-10-24 DIAGNOSIS — Z96653 Presence of artificial knee joint, bilateral: Secondary | ICD-10-CM | POA: Diagnosis not present

## 2014-10-24 DIAGNOSIS — M6281 Muscle weakness (generalized): Secondary | ICD-10-CM | POA: Diagnosis not present

## 2016-01-13 ENCOUNTER — Encounter (INDEPENDENT_AMBULATORY_CARE_PROVIDER_SITE_OTHER): Payer: Self-pay | Admitting: Orthopaedic Surgery

## 2016-01-13 ENCOUNTER — Ambulatory Visit (INDEPENDENT_AMBULATORY_CARE_PROVIDER_SITE_OTHER): Payer: Medicare Other

## 2016-01-13 ENCOUNTER — Ambulatory Visit (INDEPENDENT_AMBULATORY_CARE_PROVIDER_SITE_OTHER): Payer: Medicare Other | Admitting: Orthopaedic Surgery

## 2016-01-13 VITALS — BP 119/70 | HR 70 | Ht 61.0 in | Wt 230.0 lb

## 2016-01-13 DIAGNOSIS — M25512 Pain in left shoulder: Secondary | ICD-10-CM

## 2016-01-13 DIAGNOSIS — G8929 Other chronic pain: Secondary | ICD-10-CM | POA: Diagnosis not present

## 2016-01-13 MED ORDER — METHYLPREDNISOLONE ACETATE 40 MG/ML IJ SUSP
80.0000 mg | INTRAMUSCULAR | Status: AC | PRN
  Administered 2016-01-13: 80 mg

## 2016-01-13 MED ORDER — BUPIVACAINE HCL 0.5 % IJ SOLN
2.0000 mL | INTRAMUSCULAR | Status: AC | PRN
  Administered 2016-01-13: 2 mL via INTRA_ARTICULAR

## 2016-01-13 MED ORDER — LIDOCAINE HCL 1 % IJ SOLN
2.0000 mL | INTRAMUSCULAR | Status: AC | PRN
  Administered 2016-01-13: 2 mL

## 2016-01-13 NOTE — Progress Notes (Signed)
Office Visit Note   Patient: Diane Daugherty           Date of Birth: 01-14-1941           MRN: [id number] Visit Date: 01/13/2016              Requested by: Zella Richer. [address], [place], [place] [address] PCP: Deloria Lair, MD   Assessment & Plan: Visit Diagnoses: Left shoulder pain with impingement. 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 Physical therapy at [facility]. Follow-up 1 month  Follow-Up Instructions: No Follow-up on file.   Orders:  No orders of the defined types were placed in this encounter.  No orders of the defined types were placed in this encounter.     Procedures: Large Joint Inj Date/Time: 01/13/2016 11:54 AM Performed by: Garald Balding Authorized by: Garald Balding   Consent Given by:  Patient Timeout: prior to procedure the correct patient, procedure, and site was verified   Indications:  Pain Location:  Shoulder Site:  L subacromial bursa Prep: patient was prepped and draped in usual sterile fashion   Needle Size:  25 G Needle Length:  1.5 inches Approach:  Lateral Ultrasound Guidance: No   Fluoroscopic Guidance: No   Arthrogram: No   Medications:  80 mg methylPREDNISolone acetate 40 MG/ML; 2 mL lidocaine 1 %; 2 mL bupivacaine 0.5 % Aspiration Attempted: No   Patient tolerance:  Patient tolerated the procedure well with no immediate complications     Clinical Data: No additional findings.   Subjective: No chief complaint on file.   Left shoulder pain today  Pain shoots into biceps and down her arm and side of neck, been getting worse last month  Only takes Tylenol for her OA    No related numbness or tingling. No history of region trauma. Pain is present with range of motion particularly overhead and abduction  Review of Systems   Objective: Vital Signs: There were no vitals taken for this visit.  Physical Exam  Ortho Exam left shoulder exam  with full overhead flexion. Abduction possible to 90 with some subacromial discomfort. Positive impingement and positive empty can testing. Weakness with external rotation. No local discomfort at the acromioclavicular joint no referred pain from neck to shoulder good grip and release.:  No specialty comments available.  Imaging: No results found.   PMFS History: Patient Active Problem List   Diagnosis Date Noted  . Primary osteoarthritis of left knee 10/07/2014  . S/P total knee replacement using cement 10/07/2014  . Constipation 10/04/2012  . Acute posthemorrhagic anemia 09/26/2012  . Asthma, chronic 08/30/2012  . Hypertension 08/30/2012  . Osteoarthritis of right knee 08/30/2012  . Sleep apnea 08/30/2012  . Severe obesity (BMI >= 40) ([place]) 08/30/2012   Past Medical History:  Diagnosis Date  . Anemia 1970's   "after childbirth"  . Arthritis    "feels like all over"  . Carpal tunnel syndrome   . Carpal tunnel syndrome    left  . Cataract    immature on right  . Chronic bronchitis    "get it most years" (10/08/2014)  . Family history of anesthesia complication    daughter gets sick  . History of blood transfusion    "w/one of my back OR's"  . Hypertension    takes Hyzaar daily  . Joint pain    "all over"  . Joint swelling   . Muscle  spasms of lower extremity    takes Baclofen daily prn  . Nocturia   . OSA on CPAP   . Peripheral vascular disease   . PFO (patent foramen ovale)    positive saline bubble study 05/12/09 [facility])  . Pneumonia 2010/ 2011  . PONV (postoperative nausea and vomiting)    woke up during hysterectomy  . Shortness of breath    with exertion;Albuterol prn  . Urinary frequency     No family history on file.  Past Surgical History:  Procedure Laterality Date  . ABDOMINAL HYSTERECTOMY  1970's?  . APPENDECTOMY    . BACK SURGERY    . BREAST BIOPSY Right 1990's  . [place]; 1963; 1970  . CHEST TUBE INSERTION  80's   d/t  pneumothorax  . COLONOSCOPY    . FRACTURE SURGERY    . HARDWARE REMOVAL Right 1990's   "took screw out of my lower leg ~ 1 yr after putting it in"  . HEMILAMINOTOMY LUMBAR SPINE  02/2002   L5; decompression of the L5 and S1 nerve root; synovial cyst excision/notes 06/09/2010  . KNEE ARTHROSCOPY Right ?2013  . LUMBAR LAMINECTOMY/DECOMPRESSION MICRODISCECTOMY  02/2010  . POSTERIOR LUMBAR FUSION  05/2003   L4-5 and S-1   . TIBIA FRACTURE SURGERY Right 1990's   "put screw in"  . TOTAL KNEE ARTHROPLASTY Right 08/28/2012   Procedure: TOTAL KNEE ARTHROPLASTY;  Surgeon: Garald Balding, MD;  Location: [facility];  Service: Orthopedics;  Laterality: Right;  . TOTAL KNEE ARTHROPLASTY Left 10/07/2014  . TOTAL KNEE ARTHROPLASTY Left 10/07/2014   Procedure: TOTAL KNEE ARTHROPLASTY;  Surgeon: Garald Balding, MD;  Location: [facility];  Service: Orthopedics;  Laterality: Left;   Social History   Occupational History  . Not on file.   Social History Main Topics  . Smoking status: Never Smoker  . Smokeless tobacco: Never Used  . Alcohol use No  . Drug use: No  . Sexual activity: Not Currently

## 2016-01-26 DIAGNOSIS — M7542 Impingement syndrome of left shoulder: Secondary | ICD-10-CM | POA: Diagnosis not present

## 2016-02-02 DIAGNOSIS — M7542 Impingement syndrome of left shoulder: Secondary | ICD-10-CM | POA: Diagnosis not present

## 2016-02-09 DIAGNOSIS — M7542 Impingement syndrome of left shoulder: Secondary | ICD-10-CM | POA: Diagnosis not present

## 2016-02-10 ENCOUNTER — Ambulatory Visit (INDEPENDENT_AMBULATORY_CARE_PROVIDER_SITE_OTHER): Payer: Medicare Other | Admitting: Orthopaedic Surgery

## 2016-02-16 DIAGNOSIS — M7542 Impingement syndrome of left shoulder: Secondary | ICD-10-CM | POA: Diagnosis not present

## 2016-02-17 ENCOUNTER — Encounter (INDEPENDENT_AMBULATORY_CARE_PROVIDER_SITE_OTHER): Payer: Self-pay | Admitting: Orthopaedic Surgery

## 2016-02-17 ENCOUNTER — Ambulatory Visit (INDEPENDENT_AMBULATORY_CARE_PROVIDER_SITE_OTHER): Payer: Medicare Other | Admitting: Orthopaedic Surgery

## 2016-02-17 VITALS — BP 169/80 | HR 74 | Ht 61.0 in | Wt 227.0 lb

## 2016-02-17 DIAGNOSIS — G8929 Other chronic pain: Secondary | ICD-10-CM | POA: Diagnosis not present

## 2016-02-17 DIAGNOSIS — M25512 Pain in left shoulder: Secondary | ICD-10-CM | POA: Diagnosis not present

## 2016-02-17 NOTE — Progress Notes (Signed)
Office Visit Note   Patient: Diane Daugherty           Date of Birth: August 11, 1940           MRN: [id number] Visit Date: 02/17/2016              Requested by: Zella Richer. [address], [place], [place] [address] PCP: Deloria Lair, MD   Assessment & Plan: Visit Diagnoses: Left shoulder impingement with excellent response to sub-acromio cortisone injection and exercises.  Plan: Continue home exercise program and follow up as needed. Long discussion regarding potential need for further cortisone injections or even MRI scan. Did review her physical therapy note  Follow-Up Instructions: No Follow-up on file.   Orders:  No orders of the defined types were placed in this encounter.  No orders of the defined types were placed in this encounter.     Procedures: No procedures performed   Clinical Data: No additional findings.   Subjective: Chief Complaint  Patient presents with  . Left Shoulder - Follow-up     Patient returns for follow up left shoulder impingement. She states that she is 80% improved. She is doing physical therapy once a week and continues to exercise at home. She is being discharged from formal physical therapy unless you want her to continue.   Denies numbness or tingling in the left upper extremity. No trouble sleeping on that side. trouble sleeping on that side.  Review of Systems   Objective: Vital Signs: There were no vitals taken for this visit.  Physical Exam  Ortho Exam full active and passive overhead motion left shoulder. Full abduction. Negative impingement and negative empty can testing. No crepitation. No localized areas of tenderness. No evidence of weakness. Biceps intact. And release with normal sensibility to left hand. Denies any pain with range of motion of cervical spine or referred pain. No specialty comments available.  Imaging: No results found.   PMFS History: Patient Active Problem List   Diagnosis Date Noted  .  Primary osteoarthritis of left knee 10/07/2014  . S/P total knee replacement using cement 10/07/2014  . Constipation 10/04/2012  . Acute posthemorrhagic anemia 09/26/2012  . Asthma, chronic 08/30/2012  . Hypertension 08/30/2012  . Osteoarthritis of right knee 08/30/2012  . Sleep apnea 08/30/2012  . Severe obesity (BMI >= 40) ([place]) 08/30/2012   Past Medical History:  Diagnosis Date  . Anemia 1970's   "after childbirth"  . Arthritis    "feels like all over"  . Carpal tunnel syndrome   . Carpal tunnel syndrome    left  . Cataract    immature on right  . Chronic bronchitis ([place])    "get it most years" (10/08/2014)  . Family history of anesthesia complication    daughter gets sick  . History of blood transfusion    "w/one of my back OR's"  . Hypertension    takes Hyzaar daily  . Joint pain    "all over"  . Joint swelling   . Muscle spasms of lower extremity    takes Baclofen daily prn  . Nocturia   . OSA on CPAP   . Peripheral vascular disease ([place])   . PFO (patent foramen ovale)    positive saline bubble study 05/12/09 [facility])  . Pneumonia 2010/ 2011  . PONV (postoperative nausea and vomiting)    woke up during hysterectomy  . Shortness of breath    with exertion;Albuterol prn  . Urinary frequency  No family history on file.  Past Surgical History:  Procedure Laterality Date  . ABDOMINAL HYSTERECTOMY  1970's?  . APPENDECTOMY    . BACK SURGERY    . BREAST BIOPSY Right 1990's  . [place]; 1963; 1970  . CHEST TUBE INSERTION  80's   d/t pneumothorax  . COLONOSCOPY    . FRACTURE SURGERY    . HARDWARE REMOVAL Right 1990's   "took screw out of my lower leg ~ 1 yr after putting it in"  . HEMILAMINOTOMY LUMBAR SPINE  02/2002   L5; decompression of the L5 and S1 nerve root; synovial cyst excision/notes 06/09/2010  . KNEE ARTHROSCOPY Right ?2013  . LUMBAR LAMINECTOMY/DECOMPRESSION MICRODISCECTOMY  02/2010  . POSTERIOR LUMBAR FUSION  05/2003   L4-5 and  S-1   . TIBIA FRACTURE SURGERY Right 1990's   "put screw in"  . TOTAL KNEE ARTHROPLASTY Right 08/28/2012   Procedure: TOTAL KNEE ARTHROPLASTY;  Surgeon: Garald Balding, MD;  Location: [facility];  Service: Orthopedics;  Laterality: Right;  . TOTAL KNEE ARTHROPLASTY Left 10/07/2014  . TOTAL KNEE ARTHROPLASTY Left 10/07/2014   Procedure: TOTAL KNEE ARTHROPLASTY;  Surgeon: Garald Balding, MD;  Location: [facility];  Service: Orthopedics;  Laterality: Left;   Social History   Occupational History  . Not on file.   Social History Main Topics  . Smoking status: Never Smoker  . Smokeless tobacco: Never Used  . Alcohol use No  . Drug use: No  . Sexual activity: Not Currently

## 2016-03-28 DIAGNOSIS — Z6841 Body Mass Index (BMI) 40.0 and over, adult: Secondary | ICD-10-CM | POA: Diagnosis not present

## 2016-03-28 DIAGNOSIS — M1712 Unilateral primary osteoarthritis, left knee: Secondary | ICD-10-CM | POA: Diagnosis not present

## 2016-03-28 DIAGNOSIS — D649 Anemia, unspecified: Secondary | ICD-10-CM | POA: Diagnosis not present

## 2016-03-28 DIAGNOSIS — N183 Chronic kidney disease, stage 3 (moderate): Secondary | ICD-10-CM | POA: Diagnosis not present

## 2016-03-28 DIAGNOSIS — G4733 Obstructive sleep apnea (adult) (pediatric): Secondary | ICD-10-CM | POA: Diagnosis not present

## 2016-03-28 DIAGNOSIS — Z9989 Dependence on other enabling machines and devices: Secondary | ICD-10-CM | POA: Diagnosis not present

## 2016-03-28 DIAGNOSIS — I10 Essential (primary) hypertension: Secondary | ICD-10-CM | POA: Diagnosis not present

## 2016-03-28 DIAGNOSIS — Z0000 Encounter for general adult medical examination without abnormal findings: Secondary | ICD-10-CM | POA: Diagnosis not present

## 2016-03-28 DIAGNOSIS — M5136 Other intervertebral disc degeneration, lumbar region: Secondary | ICD-10-CM | POA: Diagnosis not present

## 2016-07-05 DIAGNOSIS — Z1211 Encounter for screening for malignant neoplasm of colon: Secondary | ICD-10-CM | POA: Diagnosis not present

## 2016-07-05 DIAGNOSIS — R944 Abnormal results of kidney function studies: Secondary | ICD-10-CM | POA: Diagnosis not present

## 2016-07-05 DIAGNOSIS — I10 Essential (primary) hypertension: Secondary | ICD-10-CM | POA: Diagnosis not present

## 2016-07-26 DIAGNOSIS — Z1231 Encounter for screening mammogram for malignant neoplasm of breast: Secondary | ICD-10-CM | POA: Diagnosis not present

## 2016-09-08 DIAGNOSIS — E669 Obesity, unspecified: Secondary | ICD-10-CM | POA: Diagnosis not present

## 2016-09-08 DIAGNOSIS — M1990 Unspecified osteoarthritis, unspecified site: Secondary | ICD-10-CM | POA: Diagnosis not present

## 2016-09-08 DIAGNOSIS — Z1211 Encounter for screening for malignant neoplasm of colon: Secondary | ICD-10-CM | POA: Diagnosis not present

## 2016-09-08 DIAGNOSIS — G4730 Sleep apnea, unspecified: Secondary | ICD-10-CM | POA: Diagnosis not present

## 2016-09-08 DIAGNOSIS — R195 Other fecal abnormalities: Secondary | ICD-10-CM | POA: Diagnosis not present

## 2016-09-08 DIAGNOSIS — Z6841 Body Mass Index (BMI) 40.0 and over, adult: Secondary | ICD-10-CM | POA: Diagnosis not present

## 2016-09-08 DIAGNOSIS — Z883 Allergy status to other anti-infective agents status: Secondary | ICD-10-CM | POA: Diagnosis not present

## 2016-09-08 DIAGNOSIS — I129 Hypertensive chronic kidney disease with stage 1 through stage 4 chronic kidney disease, or unspecified chronic kidney disease: Secondary | ICD-10-CM | POA: Diagnosis not present

## 2016-09-08 DIAGNOSIS — N183 Chronic kidney disease, stage 3 (moderate): Secondary | ICD-10-CM | POA: Diagnosis not present

## 2016-09-08 DIAGNOSIS — Z886 Allergy status to analgesic agent status: Secondary | ICD-10-CM | POA: Diagnosis not present

## 2016-09-08 DIAGNOSIS — D649 Anemia, unspecified: Secondary | ICD-10-CM | POA: Diagnosis not present

## 2016-09-08 DIAGNOSIS — D126 Benign neoplasm of colon, unspecified: Secondary | ICD-10-CM | POA: Diagnosis not present

## 2016-09-08 DIAGNOSIS — K644 Residual hemorrhoidal skin tags: Secondary | ICD-10-CM | POA: Diagnosis not present

## 2016-09-08 DIAGNOSIS — K5790 Diverticulosis of intestine, part unspecified, without perforation or abscess without bleeding: Secondary | ICD-10-CM | POA: Diagnosis not present

## 2016-09-08 DIAGNOSIS — K6289 Other specified diseases of anus and rectum: Secondary | ICD-10-CM | POA: Diagnosis not present

## 2016-09-08 DIAGNOSIS — K5730 Diverticulosis of large intestine without perforation or abscess without bleeding: Secondary | ICD-10-CM | POA: Diagnosis not present

## 2016-11-22 DIAGNOSIS — Z23 Encounter for immunization: Secondary | ICD-10-CM | POA: Diagnosis not present

## 2016-12-30 DIAGNOSIS — I10 Essential (primary) hypertension: Secondary | ICD-10-CM | POA: Diagnosis not present

## 2017-03-13 DIAGNOSIS — I10 Essential (primary) hypertension: Secondary | ICD-10-CM | POA: Diagnosis not present

## 2017-04-27 DIAGNOSIS — G4730 Sleep apnea, unspecified: Secondary | ICD-10-CM | POA: Diagnosis not present

## 2017-04-27 DIAGNOSIS — Z1329 Encounter for screening for other suspected endocrine disorder: Secondary | ICD-10-CM | POA: Diagnosis not present

## 2017-04-27 DIAGNOSIS — I10 Essential (primary) hypertension: Secondary | ICD-10-CM | POA: Diagnosis not present

## 2017-04-27 DIAGNOSIS — R609 Edema, unspecified: Secondary | ICD-10-CM | POA: Diagnosis not present

## 2017-06-05 DIAGNOSIS — Z5181 Encounter for therapeutic drug level monitoring: Secondary | ICD-10-CM | POA: Diagnosis not present

## 2017-06-05 DIAGNOSIS — G4730 Sleep apnea, unspecified: Secondary | ICD-10-CM | POA: Diagnosis not present

## 2017-06-05 DIAGNOSIS — M25512 Pain in left shoulder: Secondary | ICD-10-CM | POA: Diagnosis not present

## 2017-06-05 DIAGNOSIS — I10 Essential (primary) hypertension: Secondary | ICD-10-CM | POA: Diagnosis not present

## 2017-06-26 DIAGNOSIS — G4730 Sleep apnea, unspecified: Secondary | ICD-10-CM | POA: Diagnosis not present

## 2017-06-26 DIAGNOSIS — G4733 Obstructive sleep apnea (adult) (pediatric): Secondary | ICD-10-CM | POA: Diagnosis not present

## 2017-06-28 ENCOUNTER — Ambulatory Visit (INDEPENDENT_AMBULATORY_CARE_PROVIDER_SITE_OTHER): Payer: Medicare Other

## 2017-06-28 ENCOUNTER — Encounter (INDEPENDENT_AMBULATORY_CARE_PROVIDER_SITE_OTHER): Payer: Self-pay | Admitting: Orthopaedic Surgery

## 2017-06-28 ENCOUNTER — Ambulatory Visit (INDEPENDENT_AMBULATORY_CARE_PROVIDER_SITE_OTHER): Payer: Medicare Other | Admitting: Orthopaedic Surgery

## 2017-06-28 VITALS — BP 153/82 | HR 79 | Ht 61.0 in | Wt 230.0 lb

## 2017-06-28 DIAGNOSIS — G8929 Other chronic pain: Secondary | ICD-10-CM | POA: Diagnosis not present

## 2017-06-28 DIAGNOSIS — M25562 Pain in left knee: Secondary | ICD-10-CM

## 2017-06-28 DIAGNOSIS — Z96652 Presence of left artificial knee joint: Secondary | ICD-10-CM | POA: Diagnosis not present

## 2017-06-28 NOTE — Progress Notes (Signed)
Office Visit Note   Patient: Diane Daugherty           Date of Birth: 11-04-1940           MRN: [id number] Visit Date: 06/28/2017              Requested by: Deloria Lair., MD [place], [place] [address] PCP: Deloria Lair., MD   Assessment & Plan: Visit Diagnoses:  1. Chronic pain of left knee   2. History of left knee replacement     Plan: Painful left total knee replacement for approximately a week and a half.  No obvious problem by x-ray or exam.  Long discussion regard regarding weight loss and exercises.  Will return as needed Follow-Up Instructions:    Orders:  Orders Placed This Encounter  Procedures  . XR KNEE 3 VIEW LEFT   No orders of the defined types were placed in this encounter.     Procedures: No procedures performed   Clinical Data: No additional findings.   Subjective: Chief Complaint  Patient presents with  . Left Knee - Pain  . Follow-up    L KNEE PAIN FOR 2 WEEKS GOT UP AND GETTING READY FOR [facility]UR[facility]. HAD SURGERY 10/07/14, STILL NO BETTER NI INJURY  Diane Daugherty is 3 years status post primary left total knee replacement.  She was doing well until about a week and a half ago when she simply got out of bed and began to experience some pain.  No fever or chills.  No obvious injury.  She is feeling better but still having some pain in the area of her patella.  Is been using a cane.  No numbness or tingling.  No distal edema.  She has gained a little weight since her surgery.  She is not exercising  HPI  Review of Systems  Constitutional: Positive for fatigue. Negative for fever.  HENT: Negative for ear pain.   Eyes: Negative for pain.  Respiratory: Negative for cough and shortness of breath.   Cardiovascular: Positive for leg swelling.  Gastrointestinal: Negative for diarrhea.  Genitourinary: Negative for difficulty urinating.  Musculoskeletal: Positive for back pain. Negative for neck pain.  Skin: Negative for rash.    Allergic/Immunologic: Negative for food allergies.  Neurological: Positive for weakness and numbness.  Hematological: Does not bruise/bleed easily.  Psychiatric/Behavioral: Positive for sleep disturbance.     Objective: Vital Signs: BP (!) 153/82 (BP Location: Left Arm, Patient Position: Sitting, Cuff Size: Normal)   Pulse 79   Ht 5\' 1"  (1.549 m)   Wt 230 lb (104.3 kg)   BMI 43.46 kg/m   Physical Exam  Constitutional: She is oriented to person, place, and time. She appears well-developed and well-nourished.  HENT:  Mouth/Throat: Oropharynx is clear and moist.  Eyes: Pupils are equal, round, and reactive to light. EOM are normal.  Pulmonary/Chest: Effort normal.  Neurological: She is alert and oriented to person, place, and time.  Skin: Skin is warm and dry.  Psychiatric: She has a normal mood and affect. Her behavior is normal.    Ortho Exam awake alert and oriented x3.  Comfortable sitting.  BMI 43.5.  Full extension left knee.  No obvious effusion.  No opening with varus valgus stress.  Flexes 100 degrees.  No calf pain.  No popliteal pain.  Neurovascular exam intact.  Straight leg raise negative.  Painless range of motion right hip no increased heat or ecchymosis.  Some mild pain  about the patella but no particular discomfort with patella motion.  No grating  Specialty Comments:  No specialty comments available.  Imaging: Xr Knee 3 View Left  Result Date: 06/28/2017 Films of the left knee were obtained in 3 projections standing.  The splint appears to be in good position.  I do not see any complicating factors.  I did compare the films today from those that were done postoperatively and I do not think there is any significant change.  The patella tracks in the midline.  Normal alignment.   No evidence of loosening    PMFS History: Patient Active Problem List   Diagnosis Date Noted  . Primary osteoarthritis of left knee 10/07/2014  . History of left knee replacement  10/07/2014  . Constipation 10/04/2012  . Acute posthemorrhagic anemia 09/26/2012  . Asthma, chronic 08/30/2012  . Hypertension 08/30/2012  . Osteoarthritis of right knee 08/30/2012  . Sleep apnea 08/30/2012  . Severe obesity (BMI >= 40) ([place]) 08/30/2012   Past Medical History:  Diagnosis Date  . Anemia 1970's   "after childbirth"  . Arthritis    "feels like all over"  . Carpal tunnel syndrome   . Carpal tunnel syndrome    left  . Cataract    immature on right  . Chronic bronchitis ([place])    "get it most years" (10/08/2014)  . Family history of anesthesia complication    daughter gets sick  . History of blood transfusion    "w/one of my back OR's"  . Hypertension    takes Hyzaar daily  . Joint pain    "all over"  . Joint swelling   . Muscle spasms of lower extremity    takes Baclofen daily prn  . Nocturia   . OSA on CPAP   . Peripheral vascular disease ([place])   . PFO (patent foramen ovale)    positive saline bubble study 05/12/09 [facility])  . Pneumonia 2010/ 2011  . PONV (postoperative nausea and vomiting)    woke up during hysterectomy  . Shortness of breath    with exertion;Albuterol prn  . Urinary frequency     History reviewed. No pertinent family history.  Past Surgical History:  Procedure Laterality Date  . ABDOMINAL HYSTERECTOMY  1970's?  . APPENDECTOMY    . BACK SURGERY    . BREAST BIOPSY Right 1990's  . [place]; 1963; 1970  . CHEST TUBE INSERTION  80's   d/t pneumothorax  . COLONOSCOPY    . FRACTURE SURGERY    . HARDWARE REMOVAL Right 1990's   "took screw out of my lower leg ~ 1 yr after putting it in"  . HEMILAMINOTOMY LUMBAR SPINE  02/2002   L5; decompression of the L5 and S1 nerve root; synovial cyst excision/notes 06/09/2010  . KNEE ARTHROSCOPY Right ?2013  . LUMBAR LAMINECTOMY/DECOMPRESSION MICRODISCECTOMY  02/2010  . POSTERIOR LUMBAR FUSION  05/2003   L4-5 and S-1   . TIBIA FRACTURE SURGERY Right 1990's   "put screw in"  . TOTAL  KNEE ARTHROPLASTY Right 08/28/2012   Procedure: TOTAL KNEE ARTHROPLASTY;  Surgeon: Garald Balding, MD;  Location: [facility];  Service: Orthopedics;  Laterality: Right;  . TOTAL KNEE ARTHROPLASTY Left 10/07/2014  . TOTAL KNEE ARTHROPLASTY Left 10/07/2014   Procedure: TOTAL KNEE ARTHROPLASTY;  Surgeon: Garald Balding, MD;  Location: [facility];  Service: Orthopedics;  Laterality: Left;   Social History   Occupational History  . Not on file  Tobacco Use  .  Smoking status: Never Smoker  . Smokeless tobacco: Never Used  Substance and Sexual Activity  . Alcohol use: No  . Drug use: No  . Sexual activity: Not Currently

## 2017-08-02 DIAGNOSIS — Z6841 Body Mass Index (BMI) 40.0 and over, adult: Secondary | ICD-10-CM | POA: Diagnosis not present

## 2017-08-02 DIAGNOSIS — M545 Low back pain: Secondary | ICD-10-CM | POA: Diagnosis not present

## 2017-08-02 DIAGNOSIS — G4733 Obstructive sleep apnea (adult) (pediatric): Secondary | ICD-10-CM | POA: Diagnosis not present

## 2017-08-02 DIAGNOSIS — J301 Allergic rhinitis due to pollen: Secondary | ICD-10-CM | POA: Diagnosis not present

## 2017-08-02 DIAGNOSIS — I10 Essential (primary) hypertension: Secondary | ICD-10-CM | POA: Diagnosis not present

## 2017-08-31 DIAGNOSIS — Z1231 Encounter for screening mammogram for malignant neoplasm of breast: Secondary | ICD-10-CM | POA: Diagnosis not present

## 2017-12-04 DIAGNOSIS — Z0001 Encounter for general adult medical examination with abnormal findings: Secondary | ICD-10-CM | POA: Diagnosis not present

## 2017-12-04 DIAGNOSIS — Z6841 Body Mass Index (BMI) 40.0 and over, adult: Secondary | ICD-10-CM | POA: Diagnosis not present

## 2017-12-04 DIAGNOSIS — Z23 Encounter for immunization: Secondary | ICD-10-CM | POA: Diagnosis not present

## 2017-12-04 DIAGNOSIS — I10 Essential (primary) hypertension: Secondary | ICD-10-CM | POA: Diagnosis not present

## 2018-01-19 DIAGNOSIS — H2513 Age-related nuclear cataract, bilateral: Secondary | ICD-10-CM | POA: Diagnosis not present

## 2018-02-01 DIAGNOSIS — Z13220 Encounter for screening for lipoid disorders: Secondary | ICD-10-CM | POA: Diagnosis not present

## 2018-02-01 DIAGNOSIS — I10 Essential (primary) hypertension: Secondary | ICD-10-CM | POA: Diagnosis not present

## 2018-02-07 DIAGNOSIS — J301 Allergic rhinitis due to pollen: Secondary | ICD-10-CM | POA: Diagnosis not present

## 2018-02-07 DIAGNOSIS — G4733 Obstructive sleep apnea (adult) (pediatric): Secondary | ICD-10-CM | POA: Diagnosis not present

## 2018-02-07 DIAGNOSIS — N183 Chronic kidney disease, stage 3 (moderate): Secondary | ICD-10-CM | POA: Diagnosis not present

## 2018-02-07 DIAGNOSIS — M545 Low back pain: Secondary | ICD-10-CM | POA: Diagnosis not present

## 2018-02-07 DIAGNOSIS — Z6841 Body Mass Index (BMI) 40.0 and over, adult: Secondary | ICD-10-CM | POA: Diagnosis not present

## 2018-02-07 DIAGNOSIS — I10 Essential (primary) hypertension: Secondary | ICD-10-CM | POA: Diagnosis not present

## 2018-07-20 DIAGNOSIS — Z6841 Body Mass Index (BMI) 40.0 and over, adult: Secondary | ICD-10-CM | POA: Diagnosis not present

## 2018-07-20 DIAGNOSIS — S46919A Strain of unspecified muscle, fascia and tendon at shoulder and upper arm level, unspecified arm, initial encounter: Secondary | ICD-10-CM | POA: Diagnosis not present

## 2018-07-31 DIAGNOSIS — S46919A Strain of unspecified muscle, fascia and tendon at shoulder and upper arm level, unspecified arm, initial encounter: Secondary | ICD-10-CM | POA: Diagnosis not present

## 2018-07-31 DIAGNOSIS — Z6841 Body Mass Index (BMI) 40.0 and over, adult: Secondary | ICD-10-CM | POA: Diagnosis not present

## 2018-08-06 DIAGNOSIS — N183 Chronic kidney disease, stage 3 (moderate): Secondary | ICD-10-CM | POA: Diagnosis not present

## 2018-08-06 DIAGNOSIS — I10 Essential (primary) hypertension: Secondary | ICD-10-CM | POA: Diagnosis not present

## 2018-08-09 DIAGNOSIS — M545 Low back pain: Secondary | ICD-10-CM | POA: Diagnosis not present

## 2018-08-09 DIAGNOSIS — G4733 Obstructive sleep apnea (adult) (pediatric): Secondary | ICD-10-CM | POA: Diagnosis not present

## 2018-08-09 DIAGNOSIS — S46919A Strain of unspecified muscle, fascia and tendon at shoulder and upper arm level, unspecified arm, initial encounter: Secondary | ICD-10-CM | POA: Diagnosis not present

## 2018-08-09 DIAGNOSIS — Z6841 Body Mass Index (BMI) 40.0 and over, adult: Secondary | ICD-10-CM | POA: Diagnosis not present

## 2018-08-09 DIAGNOSIS — I10 Essential (primary) hypertension: Secondary | ICD-10-CM | POA: Diagnosis not present

## 2018-08-09 DIAGNOSIS — N183 Chronic kidney disease, stage 3 (moderate): Secondary | ICD-10-CM | POA: Diagnosis not present

## 2018-08-15 DIAGNOSIS — M7550 Bursitis of unspecified shoulder: Secondary | ICD-10-CM | POA: Diagnosis not present

## 2018-11-12 DIAGNOSIS — Z1231 Encounter for screening mammogram for malignant neoplasm of breast: Secondary | ICD-10-CM | POA: Diagnosis not present

## 2018-12-04 DIAGNOSIS — Z23 Encounter for immunization: Secondary | ICD-10-CM | POA: Diagnosis not present

## 2019-02-08 DIAGNOSIS — G4733 Obstructive sleep apnea (adult) (pediatric): Secondary | ICD-10-CM | POA: Diagnosis not present

## 2019-02-08 DIAGNOSIS — I10 Essential (primary) hypertension: Secondary | ICD-10-CM | POA: Diagnosis not present

## 2019-02-08 DIAGNOSIS — R5383 Other fatigue: Secondary | ICD-10-CM | POA: Diagnosis not present

## 2019-02-08 DIAGNOSIS — N183 Chronic kidney disease, stage 3 unspecified: Secondary | ICD-10-CM | POA: Diagnosis not present

## 2019-02-08 DIAGNOSIS — Z13220 Encounter for screening for lipoid disorders: Secondary | ICD-10-CM | POA: Diagnosis not present

## 2019-02-11 DIAGNOSIS — Z23 Encounter for immunization: Secondary | ICD-10-CM | POA: Diagnosis not present

## 2019-02-13 DIAGNOSIS — Z1331 Encounter for screening for depression: Secondary | ICD-10-CM | POA: Diagnosis not present

## 2019-02-13 DIAGNOSIS — Z0001 Encounter for general adult medical examination with abnormal findings: Secondary | ICD-10-CM | POA: Diagnosis not present

## 2019-02-13 DIAGNOSIS — I10 Essential (primary) hypertension: Secondary | ICD-10-CM | POA: Diagnosis not present

## 2019-02-13 DIAGNOSIS — R42 Dizziness and giddiness: Secondary | ICD-10-CM | POA: Diagnosis not present

## 2019-02-13 DIAGNOSIS — Z1389 Encounter for screening for other disorder: Secondary | ICD-10-CM | POA: Diagnosis not present

## 2019-02-13 DIAGNOSIS — Z6841 Body Mass Index (BMI) 40.0 and over, adult: Secondary | ICD-10-CM | POA: Diagnosis not present

## 2019-03-11 DIAGNOSIS — Z23 Encounter for immunization: Secondary | ICD-10-CM | POA: Diagnosis not present

## 2019-08-08 DIAGNOSIS — N183 Chronic kidney disease, stage 3 unspecified: Secondary | ICD-10-CM | POA: Diagnosis not present

## 2019-08-08 DIAGNOSIS — R5383 Other fatigue: Secondary | ICD-10-CM | POA: Diagnosis not present

## 2019-08-08 DIAGNOSIS — E782 Mixed hyperlipidemia: Secondary | ICD-10-CM | POA: Diagnosis not present

## 2019-08-08 DIAGNOSIS — I10 Essential (primary) hypertension: Secondary | ICD-10-CM | POA: Diagnosis not present

## 2019-08-12 DIAGNOSIS — I10 Essential (primary) hypertension: Secondary | ICD-10-CM | POA: Diagnosis not present

## 2019-08-12 DIAGNOSIS — M545 Low back pain: Secondary | ICD-10-CM | POA: Diagnosis not present

## 2019-08-12 DIAGNOSIS — S46919A Strain of unspecified muscle, fascia and tendon at shoulder and upper arm level, unspecified arm, initial encounter: Secondary | ICD-10-CM | POA: Diagnosis not present

## 2019-08-12 DIAGNOSIS — Z6841 Body Mass Index (BMI) 40.0 and over, adult: Secondary | ICD-10-CM | POA: Diagnosis not present

## 2019-08-12 DIAGNOSIS — G4733 Obstructive sleep apnea (adult) (pediatric): Secondary | ICD-10-CM | POA: Diagnosis not present

## 2019-09-24 DIAGNOSIS — E7849 Other hyperlipidemia: Secondary | ICD-10-CM | POA: Diagnosis not present

## 2019-09-24 DIAGNOSIS — I129 Hypertensive chronic kidney disease with stage 1 through stage 4 chronic kidney disease, or unspecified chronic kidney disease: Secondary | ICD-10-CM | POA: Diagnosis not present

## 2019-09-24 DIAGNOSIS — N183 Chronic kidney disease, stage 3 unspecified: Secondary | ICD-10-CM | POA: Diagnosis not present

## 2019-10-24 DIAGNOSIS — E7849 Other hyperlipidemia: Secondary | ICD-10-CM | POA: Diagnosis not present

## 2019-10-24 DIAGNOSIS — N183 Chronic kidney disease, stage 3 unspecified: Secondary | ICD-10-CM | POA: Diagnosis not present

## 2019-10-24 DIAGNOSIS — I129 Hypertensive chronic kidney disease with stage 1 through stage 4 chronic kidney disease, or unspecified chronic kidney disease: Secondary | ICD-10-CM | POA: Diagnosis not present

## 2019-11-13 DIAGNOSIS — Z23 Encounter for immunization: Secondary | ICD-10-CM | POA: Diagnosis not present

## 2019-11-23 DIAGNOSIS — I129 Hypertensive chronic kidney disease with stage 1 through stage 4 chronic kidney disease, or unspecified chronic kidney disease: Secondary | ICD-10-CM | POA: Diagnosis not present

## 2019-11-23 DIAGNOSIS — N183 Chronic kidney disease, stage 3 unspecified: Secondary | ICD-10-CM | POA: Diagnosis not present

## 2019-11-23 DIAGNOSIS — E7849 Other hyperlipidemia: Secondary | ICD-10-CM | POA: Diagnosis not present

## 2019-12-12 DIAGNOSIS — Z23 Encounter for immunization: Secondary | ICD-10-CM | POA: Diagnosis not present

## 2019-12-16 DIAGNOSIS — D126 Benign neoplasm of colon, unspecified: Secondary | ICD-10-CM | POA: Diagnosis not present

## 2019-12-24 DIAGNOSIS — E7849 Other hyperlipidemia: Secondary | ICD-10-CM | POA: Diagnosis not present

## 2019-12-24 DIAGNOSIS — N183 Chronic kidney disease, stage 3 unspecified: Secondary | ICD-10-CM | POA: Diagnosis not present

## 2019-12-24 DIAGNOSIS — I129 Hypertensive chronic kidney disease with stage 1 through stage 4 chronic kidney disease, or unspecified chronic kidney disease: Secondary | ICD-10-CM | POA: Diagnosis not present

## 2020-01-09 DIAGNOSIS — Z1231 Encounter for screening mammogram for malignant neoplasm of breast: Secondary | ICD-10-CM | POA: Diagnosis not present

## 2020-01-24 DIAGNOSIS — N183 Chronic kidney disease, stage 3 unspecified: Secondary | ICD-10-CM | POA: Diagnosis not present

## 2020-01-24 DIAGNOSIS — E7849 Other hyperlipidemia: Secondary | ICD-10-CM | POA: Diagnosis not present

## 2020-01-24 DIAGNOSIS — R42 Dizziness and giddiness: Secondary | ICD-10-CM | POA: Diagnosis not present

## 2020-01-24 DIAGNOSIS — I129 Hypertensive chronic kidney disease with stage 1 through stage 4 chronic kidney disease, or unspecified chronic kidney disease: Secondary | ICD-10-CM | POA: Diagnosis not present

## 2020-02-07 DIAGNOSIS — Z13220 Encounter for screening for lipoid disorders: Secondary | ICD-10-CM | POA: Diagnosis not present

## 2020-02-07 DIAGNOSIS — E7849 Other hyperlipidemia: Secondary | ICD-10-CM | POA: Diagnosis not present

## 2020-02-07 DIAGNOSIS — N183 Chronic kidney disease, stage 3 unspecified: Secondary | ICD-10-CM | POA: Diagnosis not present

## 2020-02-07 DIAGNOSIS — E782 Mixed hyperlipidemia: Secondary | ICD-10-CM | POA: Diagnosis not present

## 2020-02-07 DIAGNOSIS — I10 Essential (primary) hypertension: Secondary | ICD-10-CM | POA: Diagnosis not present

## 2020-02-17 DIAGNOSIS — Z01818 Encounter for other preprocedural examination: Secondary | ICD-10-CM | POA: Diagnosis not present

## 2020-02-18 DIAGNOSIS — Z23 Encounter for immunization: Secondary | ICD-10-CM | POA: Diagnosis not present

## 2020-02-18 DIAGNOSIS — Z6841 Body Mass Index (BMI) 40.0 and over, adult: Secondary | ICD-10-CM | POA: Diagnosis not present

## 2020-02-18 DIAGNOSIS — Z0001 Encounter for general adult medical examination with abnormal findings: Secondary | ICD-10-CM | POA: Diagnosis not present

## 2020-02-18 DIAGNOSIS — G4733 Obstructive sleep apnea (adult) (pediatric): Secondary | ICD-10-CM | POA: Diagnosis not present

## 2020-02-18 DIAGNOSIS — S46919A Strain of unspecified muscle, fascia and tendon at shoulder and upper arm level, unspecified arm, initial encounter: Secondary | ICD-10-CM | POA: Diagnosis not present

## 2020-02-18 DIAGNOSIS — I10 Essential (primary) hypertension: Secondary | ICD-10-CM | POA: Diagnosis not present

## 2020-02-20 DIAGNOSIS — K641 Second degree hemorrhoids: Secondary | ICD-10-CM | POA: Diagnosis not present

## 2020-02-20 DIAGNOSIS — K644 Residual hemorrhoidal skin tags: Secondary | ICD-10-CM | POA: Diagnosis not present

## 2020-02-20 DIAGNOSIS — K5730 Diverticulosis of large intestine without perforation or abscess without bleeding: Secondary | ICD-10-CM | POA: Diagnosis not present

## 2020-02-20 DIAGNOSIS — I129 Hypertensive chronic kidney disease with stage 1 through stage 4 chronic kidney disease, or unspecified chronic kidney disease: Secondary | ICD-10-CM | POA: Diagnosis not present

## 2020-02-20 DIAGNOSIS — D126 Benign neoplasm of colon, unspecified: Secondary | ICD-10-CM | POA: Diagnosis not present

## 2020-02-20 DIAGNOSIS — Z885 Allergy status to narcotic agent status: Secondary | ICD-10-CM | POA: Diagnosis not present

## 2020-02-20 DIAGNOSIS — N183 Chronic kidney disease, stage 3 unspecified: Secondary | ICD-10-CM | POA: Diagnosis not present

## 2020-02-20 DIAGNOSIS — Z79899 Other long term (current) drug therapy: Secondary | ICD-10-CM | POA: Diagnosis not present

## 2020-02-20 DIAGNOSIS — K635 Polyp of colon: Secondary | ICD-10-CM | POA: Diagnosis not present

## 2020-02-20 DIAGNOSIS — Z86010 Personal history of colonic polyps: Secondary | ICD-10-CM | POA: Diagnosis not present

## 2020-02-20 DIAGNOSIS — Z1211 Encounter for screening for malignant neoplasm of colon: Secondary | ICD-10-CM | POA: Diagnosis not present

## 2020-03-11 DIAGNOSIS — D126 Benign neoplasm of colon, unspecified: Secondary | ICD-10-CM | POA: Diagnosis not present

## 2020-03-23 DIAGNOSIS — E7849 Other hyperlipidemia: Secondary | ICD-10-CM | POA: Diagnosis not present

## 2020-03-23 DIAGNOSIS — I129 Hypertensive chronic kidney disease with stage 1 through stage 4 chronic kidney disease, or unspecified chronic kidney disease: Secondary | ICD-10-CM | POA: Diagnosis not present

## 2020-03-23 DIAGNOSIS — N183 Chronic kidney disease, stage 3 unspecified: Secondary | ICD-10-CM | POA: Diagnosis not present

## 2020-03-23 DIAGNOSIS — R42 Dizziness and giddiness: Secondary | ICD-10-CM | POA: Diagnosis not present

## 2020-04-14 DIAGNOSIS — H52223 Regular astigmatism, bilateral: Secondary | ICD-10-CM | POA: Diagnosis not present

## 2020-04-14 DIAGNOSIS — H2513 Age-related nuclear cataract, bilateral: Secondary | ICD-10-CM | POA: Diagnosis not present

## 2020-04-14 DIAGNOSIS — H524 Presbyopia: Secondary | ICD-10-CM | POA: Diagnosis not present

## 2020-04-14 DIAGNOSIS — H3550 Unspecified hereditary retinal dystrophy: Secondary | ICD-10-CM | POA: Diagnosis not present

## 2020-04-14 DIAGNOSIS — H5203 Hypermetropia, bilateral: Secondary | ICD-10-CM | POA: Diagnosis not present

## 2020-04-22 DIAGNOSIS — N183 Chronic kidney disease, stage 3 unspecified: Secondary | ICD-10-CM | POA: Diagnosis not present

## 2020-04-22 DIAGNOSIS — I129 Hypertensive chronic kidney disease with stage 1 through stage 4 chronic kidney disease, or unspecified chronic kidney disease: Secondary | ICD-10-CM | POA: Diagnosis not present

## 2020-04-22 DIAGNOSIS — R42 Dizziness and giddiness: Secondary | ICD-10-CM | POA: Diagnosis not present

## 2020-04-22 DIAGNOSIS — E7849 Other hyperlipidemia: Secondary | ICD-10-CM | POA: Diagnosis not present

## 2020-05-23 DIAGNOSIS — E7849 Other hyperlipidemia: Secondary | ICD-10-CM | POA: Diagnosis not present

## 2020-05-23 DIAGNOSIS — I129 Hypertensive chronic kidney disease with stage 1 through stage 4 chronic kidney disease, or unspecified chronic kidney disease: Secondary | ICD-10-CM | POA: Diagnosis not present

## 2020-05-23 DIAGNOSIS — N183 Chronic kidney disease, stage 3 unspecified: Secondary | ICD-10-CM | POA: Diagnosis not present

## 2020-05-23 DIAGNOSIS — R42 Dizziness and giddiness: Secondary | ICD-10-CM | POA: Diagnosis not present

## 2020-06-16 DIAGNOSIS — I10 Essential (primary) hypertension: Secondary | ICD-10-CM | POA: Diagnosis not present

## 2020-06-16 DIAGNOSIS — E7849 Other hyperlipidemia: Secondary | ICD-10-CM | POA: Diagnosis not present

## 2020-06-16 DIAGNOSIS — G4733 Obstructive sleep apnea (adult) (pediatric): Secondary | ICD-10-CM | POA: Diagnosis not present

## 2020-06-16 DIAGNOSIS — N183 Chronic kidney disease, stage 3 unspecified: Secondary | ICD-10-CM | POA: Diagnosis not present

## 2020-06-22 DIAGNOSIS — I129 Hypertensive chronic kidney disease with stage 1 through stage 4 chronic kidney disease, or unspecified chronic kidney disease: Secondary | ICD-10-CM | POA: Diagnosis not present

## 2020-06-22 DIAGNOSIS — N183 Chronic kidney disease, stage 3 unspecified: Secondary | ICD-10-CM | POA: Diagnosis not present

## 2020-06-22 DIAGNOSIS — E7849 Other hyperlipidemia: Secondary | ICD-10-CM | POA: Diagnosis not present

## 2020-06-22 DIAGNOSIS — R42 Dizziness and giddiness: Secondary | ICD-10-CM | POA: Diagnosis not present

## 2020-06-30 DIAGNOSIS — Z23 Encounter for immunization: Secondary | ICD-10-CM | POA: Diagnosis not present

## 2020-06-30 DIAGNOSIS — I10 Essential (primary) hypertension: Secondary | ICD-10-CM | POA: Diagnosis not present

## 2020-07-20 DIAGNOSIS — N2581 Secondary hyperparathyroidism of renal origin: Secondary | ICD-10-CM | POA: Diagnosis not present

## 2020-07-20 DIAGNOSIS — I129 Hypertensive chronic kidney disease with stage 1 through stage 4 chronic kidney disease, or unspecified chronic kidney disease: Secondary | ICD-10-CM | POA: Diagnosis not present

## 2020-07-20 DIAGNOSIS — D631 Anemia in chronic kidney disease: Secondary | ICD-10-CM | POA: Diagnosis not present

## 2020-07-20 DIAGNOSIS — N1832 Chronic kidney disease, stage 3b: Secondary | ICD-10-CM | POA: Diagnosis not present

## 2020-07-20 DIAGNOSIS — N189 Chronic kidney disease, unspecified: Secondary | ICD-10-CM | POA: Diagnosis not present

## 2020-07-22 ENCOUNTER — Other Ambulatory Visit: Payer: Self-pay | Admitting: Nephrology

## 2020-07-22 DIAGNOSIS — N1832 Chronic kidney disease, stage 3b: Secondary | ICD-10-CM

## 2020-07-22 DIAGNOSIS — I129 Hypertensive chronic kidney disease with stage 1 through stage 4 chronic kidney disease, or unspecified chronic kidney disease: Secondary | ICD-10-CM

## 2020-07-23 DIAGNOSIS — I129 Hypertensive chronic kidney disease with stage 1 through stage 4 chronic kidney disease, or unspecified chronic kidney disease: Secondary | ICD-10-CM | POA: Diagnosis not present

## 2020-07-23 DIAGNOSIS — E7849 Other hyperlipidemia: Secondary | ICD-10-CM | POA: Diagnosis not present

## 2020-07-23 DIAGNOSIS — N183 Chronic kidney disease, stage 3 unspecified: Secondary | ICD-10-CM | POA: Diagnosis not present

## 2020-07-23 DIAGNOSIS — R42 Dizziness and giddiness: Secondary | ICD-10-CM | POA: Diagnosis not present

## 2020-07-28 DIAGNOSIS — Z20822 Contact with and (suspected) exposure to covid-19: Secondary | ICD-10-CM | POA: Diagnosis not present

## 2020-08-06 ENCOUNTER — Ambulatory Visit
Admission: RE | Admit: 2020-08-06 | Discharge: 2020-08-06 | Disposition: A | Discharge status: Home / Self Care | Payer: Medicare Other | Source: Ambulatory Visit | Attending: Nephrology | Admitting: Nephrology

## 2020-08-06 DIAGNOSIS — I129 Hypertensive chronic kidney disease with stage 1 through stage 4 chronic kidney disease, or unspecified chronic kidney disease: Secondary | ICD-10-CM

## 2020-08-06 DIAGNOSIS — N189 Chronic kidney disease, unspecified: Secondary | ICD-10-CM | POA: Diagnosis not present

## 2020-08-06 DIAGNOSIS — N1832 Chronic kidney disease, stage 3b: Secondary | ICD-10-CM

## 2020-08-12 DIAGNOSIS — E782 Mixed hyperlipidemia: Secondary | ICD-10-CM | POA: Diagnosis not present

## 2020-08-12 DIAGNOSIS — R5383 Other fatigue: Secondary | ICD-10-CM | POA: Diagnosis not present

## 2020-08-12 DIAGNOSIS — E7849 Other hyperlipidemia: Secondary | ICD-10-CM | POA: Diagnosis not present

## 2020-08-12 DIAGNOSIS — I10 Essential (primary) hypertension: Secondary | ICD-10-CM | POA: Diagnosis not present

## 2020-08-12 DIAGNOSIS — N183 Chronic kidney disease, stage 3 unspecified: Secondary | ICD-10-CM | POA: Diagnosis not present

## 2020-08-17 DIAGNOSIS — E7849 Other hyperlipidemia: Secondary | ICD-10-CM | POA: Diagnosis not present

## 2020-08-17 DIAGNOSIS — I10 Essential (primary) hypertension: Secondary | ICD-10-CM | POA: Diagnosis not present

## 2020-08-17 DIAGNOSIS — R809 Proteinuria, unspecified: Secondary | ICD-10-CM | POA: Diagnosis not present

## 2020-08-17 DIAGNOSIS — G4733 Obstructive sleep apnea (adult) (pediatric): Secondary | ICD-10-CM | POA: Diagnosis not present

## 2020-08-17 DIAGNOSIS — F32A Depression, unspecified: Secondary | ICD-10-CM | POA: Diagnosis not present

## 2020-08-17 DIAGNOSIS — F411 Generalized anxiety disorder: Secondary | ICD-10-CM | POA: Diagnosis not present

## 2020-08-17 DIAGNOSIS — M545 Low back pain, unspecified: Secondary | ICD-10-CM | POA: Diagnosis not present

## 2020-09-02 ENCOUNTER — Telehealth: Payer: Self-pay | Admitting: Hematology and Oncology

## 2020-09-02 NOTE — Telephone Encounter (Signed)
Received a new hem referral from [facility] for positive m spike. Pt returned my call and has been scheduled to see Dr. Lorenso Courier on 8/29 at 1:40pm. Pt aware to arrive 20 minutes early.

## 2020-09-21 ENCOUNTER — Other Ambulatory Visit: Payer: Self-pay

## 2020-09-21 ENCOUNTER — Inpatient Hospital Stay: Payer: Medicare Other | Attending: Hematology and Oncology | Admitting: Hematology and Oncology

## 2020-09-21 ENCOUNTER — Inpatient Hospital Stay: Payer: Medicare Other

## 2020-09-21 VITALS — BP 166/87 | HR 91 | Temp 98.3°F | Resp 18 | Wt 211.2 lb

## 2020-09-21 DIAGNOSIS — D472 Monoclonal gammopathy: Secondary | ICD-10-CM | POA: Insufficient documentation

## 2020-09-21 DIAGNOSIS — M1990 Unspecified osteoarthritis, unspecified site: Secondary | ICD-10-CM | POA: Diagnosis not present

## 2020-09-21 DIAGNOSIS — G4733 Obstructive sleep apnea (adult) (pediatric): Secondary | ICD-10-CM | POA: Insufficient documentation

## 2020-09-21 DIAGNOSIS — D649 Anemia, unspecified: Secondary | ICD-10-CM | POA: Diagnosis not present

## 2020-09-21 DIAGNOSIS — I129 Hypertensive chronic kidney disease with stage 1 through stage 4 chronic kidney disease, or unspecified chronic kidney disease: Secondary | ICD-10-CM | POA: Insufficient documentation

## 2020-09-21 DIAGNOSIS — N183 Chronic kidney disease, stage 3 unspecified: Secondary | ICD-10-CM | POA: Diagnosis not present

## 2020-09-21 DIAGNOSIS — Z79899 Other long term (current) drug therapy: Secondary | ICD-10-CM | POA: Insufficient documentation

## 2020-09-21 LAB — CBC WITH DIFFERENTIAL (CANCER CENTER ONLY)
Abs Immature Granulocytes: 0.02 10*3/uL (ref 0.00–0.07)
Basophils Absolute: 0 10*3/uL (ref 0.0–0.1)
Basophils Relative: 0 %
Eosinophils Absolute: 0.2 10*3/uL (ref 0.0–0.5)
Eosinophils Relative: 2 %
HCT: 35.6 % — ABNORMAL LOW (ref 36.0–46.0)
Hemoglobin: 12.1 g/dL (ref 12.0–15.0)
Immature Granulocytes: 0 %
Lymphocytes Relative: 31 %
Lymphs Abs: 2.6 10*3/uL (ref 0.7–4.0)
MCH: 31.3 pg (ref 26.0–34.0)
MCHC: 34 g/dL (ref 30.0–36.0)
MCV: 92.2 fL (ref 80.0–100.0)
Monocytes Absolute: 0.4 10*3/uL (ref 0.1–1.0)
Monocytes Relative: 4 %
Neutro Abs: 5.2 10*3/uL (ref 1.7–7.7)
Neutrophils Relative %: 63 %
Platelet Count: 272 10*3/uL (ref 150–400)
RBC: 3.86 MIL/uL — ABNORMAL LOW (ref 3.87–5.11)
RDW: 13.8 % (ref 11.5–15.5)
WBC Count: 8.4 10*3/uL (ref 4.0–10.5)
nRBC: 0 % (ref 0.0–0.2)

## 2020-09-21 LAB — CMP (CANCER CENTER ONLY)
ALT: 13 U/L (ref 0–44)
AST: 18 U/L (ref 15–41)
Albumin: 3.8 g/dL (ref 3.5–5.0)
Alkaline Phosphatase: 78 U/L (ref 38–126)
Anion gap: 10 (ref 5–15)
BUN: 11 mg/dL (ref 8–23)
CO2: 28 mmol/L (ref 22–32)
Calcium: 9.9 mg/dL (ref 8.9–10.3)
Chloride: 102 mmol/L (ref 98–111)
Creatinine: 1.31 mg/dL — ABNORMAL HIGH (ref 0.44–1.00)
GFR, Estimated: 42 mL/min — ABNORMAL LOW (ref >60)
Glucose, Bld: 89 mg/dL (ref 70–99)
Potassium: 4.2 mmol/L (ref 3.5–5.1)
Sodium: 140 mmol/L (ref 135–145)
Total Bilirubin: 0.4 mg/dL (ref 0.3–1.2)
Total Protein: 8.5 g/dL — ABNORMAL HIGH (ref 6.5–8.1)

## 2020-09-21 LAB — LACTATE DEHYDROGENASE: LDH: 168 U/L (ref 98–192)

## 2020-09-21 NOTE — Progress Notes (Signed)
[place] [phone]   Fax:([phone]) [place] NOTE  Patient Care Team: Deloria Lair., MD as PCP - General (Family Medicine)  Hematological/Oncological History # Monoclonal Gammopathy 07/20/2020: SPEP showed M spike 1.7 g/dl, UPEP showed no abnormalities 09/21/2020: establish care with Dr. Lorenso Courier   CHIEF COMPLAINTS/PURPOSE OF CONSULTATION:  "Monoclonal Gammopathy "  HISTORY OF PRESENTING ILLNESS:  Diane Daugherty 80 y.o. female with medical history significant for CKD stage III, OSA on CPAP, arthritis, and HTN who presents for evaluation of an M spike on SPEP.   On review of the previous records Diane Daugherty was last seen by nephrology on 07/20/2020. At that time she had an SPEP, showed an M spike of 1.7 g/dL.  She also had a UPEP drawn which showed no abnormalities.  Due to concern for this finding the patient was referred to hematology for further evaluation.  On exam today Diane Daugherty is accompanied by her daughter.  She notes that she has had CKD for at least the last 3 years.  She notes that she is also been having issues with anemia since at least 2016 when she underwent hernia surgery.  She reports that her energy levels are "so-so".  She does not have any issues with shortness of breath or difficulty walking.  She notes that she has made some dietary changes including cutting down on salt, potatoes, dairy products and reports that she does not eat as much red meat.  She has been eating more chicken fish.  She denies any overt signs of or bruising.  She notes no changes in the color of her urine but does endorse having more urine at night.  On further discussion she notes that her mother passed away in a car accident and she is unsure how her father passed away.  Maternal aunt with breast cancer has a sister who is healthy.  She also has 3 daughters and 2 grandchildren who are also healthy.  She is a never smoker and does not currently drink any  alcohol.  She currently works in Engineer, materials.  She currently denies any fevers, chills, sweats, nausea, vomiting or diarrhea.  A full 10 point ROS is listed below.  MEDICAL HISTORY:  Past Medical History:  Diagnosis Date   Anemia 1970's   "after childbirth"   Arthritis    "feels like all over"   Carpal tunnel syndrome    Carpal tunnel syndrome    left   Cataract    immature on right   Chronic bronchitis ([place])    "get it most years" (10/08/2014)   Family history of anesthesia complication    daughter gets sick   History of blood transfusion    "w/one of my back OR's"   Hypertension    takes Hyzaar daily   Joint pain    "all over"   Joint swelling    Muscle spasms of lower extremity    takes Baclofen daily prn   Nocturia    OSA on CPAP    Peripheral vascular disease (HCC)    PFO (patent foramen ovale)    positive saline bubble study 05/12/09 ([place])   Pneumonia 2010/ 2011   PONV (postoperative nausea and vomiting)    woke up during hysterectomy   Shortness of breath    with exertion;Albuterol prn   Urinary frequency     SURGICAL HISTORY: Past Surgical History:  Procedure Laterality Date   ABDOMINAL HYSTERECTOMY  1970's?   APPENDECTOMY  BACK SURGERY     BREAST BIOPSY Right 1990's   [place]; 1963; [place]  80's   d/t pneumothorax   COLONOSCOPY     FRACTURE SURGERY     HARDWARE REMOVAL Right 1990's   "took screw out of my lower leg ~ 1 yr after putting it in"   HEMILAMINOTOMY LUMBAR SPINE  02/2002   L5; decompression of the L5 and S1 nerve root; synovial cyst excision/notes 06/09/2010   KNEE ARTHROSCOPY Right ?2013   LUMBAR LAMINECTOMY/DECOMPRESSION MICRODISCECTOMY  02/2010   POSTERIOR LUMBAR FUSION  05/2003   L4-5 and S-1    TIBIA FRACTURE SURGERY Right 1990's   "put screw in"   TOTAL KNEE ARTHROPLASTY Right 08/28/2012   Procedure: TOTAL KNEE ARTHROPLASTY;  Surgeon: Garald Balding, MD;  Location: [facility];  Service:  Orthopedics;  Laterality: Right;   TOTAL KNEE ARTHROPLASTY Left 10/07/2014   TOTAL KNEE ARTHROPLASTY Left 10/07/2014   Procedure: TOTAL KNEE ARTHROPLASTY;  Surgeon: Garald Balding, MD;  Location: [facility];  Service: Orthopedics;  Laterality: Left;    SOCIAL HISTORY: Social History   Socioeconomic History   Marital status: Divorced    Spouse name: Not on file   Number of children: Not on file   Years of education: Not on file   Highest education level: Not on file  Occupational History   Not on file  Tobacco Use   Smoking status: Never   Smokeless tobacco: Never  Substance and Sexual Activity   Alcohol use: No   Drug use: No   Sexual activity: Not Currently  Other Topics Concern   Not on file  Social History Narrative   Not on file   Social Determinants of Health   Financial Resource Strain: Not on file  Food Insecurity: Not on file  Transportation Needs: Not on file  Physical Activity: Not on file  Stress: Not on file  Social Connections: Not on file  Intimate Partner Violence: Not on file    FAMILY HISTORY: No family history on file.  ALLERGIES:  is allergic to levaquin [levofloxacin], morphine and related, other, oxycodone, and naprosyn [naproxen].  MEDICATIONS:  Current Outpatient Medications  Medication Sig Dispense Refill   spironolactone (ALDACTONE) 25 MG tablet Take 1 tablet by mouth daily.     acetaminophen (TYLENOL) 650 MG CR tablet Take 1,300 mg by mouth daily.     amLODipine (NORVASC) 5 MG tablet Take 5 mg by mouth daily.     baclofen (LIORESAL) 20 MG tablet Take 10 mg by mouth daily as needed (muscle spasms).     beta carotene w/minerals (OCUVITE) tablet Take 1 tablet by mouth daily.     Calcium Citrate-Vitamin D (CALCIUM CITRATE + D PO) Take 1 tablet by mouth daily.     ferrous sulfate 325 (65 FE) MG tablet Take 325 mg by mouth 2 (two) times daily.     fluticasone (FLONASE) 50 MCG/ACT nasal spray Place 2 sprays into both nostrils daily as needed for  allergies or rhinitis.     Krill Oil 300 MG CAPS Take 1 capsule by mouth daily.     No current facility-administered medications for this visit.    REVIEW OF SYSTEMS:   Constitutional: ( - ) fevers, ( - )  chills , ( - ) night sweats Eyes: ( - ) blurriness of vision, ( - ) double vision, ( - ) watery eyes Ears, nose, mouth, throat, and face: ( - ) mucositis, ( - )  sore throat Respiratory: ( - ) cough, ( - ) dyspnea, ( - ) wheezes Cardiovascular: ( - ) palpitation, ( - ) chest discomfort, ( - ) lower extremity swelling Gastrointestinal:  ( - ) nausea, ( - ) heartburn, ( - ) change in bowel habits Skin: ( - ) abnormal skin rashes Lymphatics: ( - ) new lymphadenopathy, ( - ) easy bruising Neurological: ( - ) numbness, ( - ) tingling, ( - ) new weaknesses Behavioral/Psych: ( - ) mood change, ( - ) new changes  All other systems were reviewed with the patient and are negative.  PHYSICAL EXAMINATION: ECOG PERFORMANCE STATUS: 0 - Asymptomatic  Vitals:   09/21/20 1339  BP: (!) 166/87  Pulse: 91  Resp: 18  Temp: 98.3 F (36.8 C)  SpO2: 100%   Filed Weights   09/21/20 1339  Weight: 211 lb 3.2 oz (95.8 kg)    GENERAL: well appearing elderly African American in NAD  SKIN: skin color, texture, turgor are normal, no rashes or significant lesions EYES: conjunctiva are pink and non-injected, sclera clear LUNGS: clear to auscultation and percussion with normal breathing effort HEART: regular rate & rhythm and no murmurs and no lower extremity edema Musculoskeletal: no cyanosis of digits and no clubbing  PSYCH: alert & oriented x 3, fluent speech NEURO: no focal motor/sensory deficits  LABORATORY DATA:  I have reviewed the data as listed CBC Latest Ref Rng & Units 09/21/2020 10/10/2014 10/09/2014  WBC 4.0 - 10.5 K/uL 8.4 9.1 9.3  Hemoglobin 12.0 - 15.0 g/dL 12.1 9.5(L) 9.3(L)  Hematocrit 36.0 - 46.0 % 35.6(L) 28.2(L) 28.2(L)  Platelets 150 - 400 K/uL 272 169 167    CMP Latest Ref Rng  & Units 09/21/2020 10/10/2014 10/09/2014  Glucose 70 - 99 mg/dL 89 116(H) 106(H)  BUN 8 - 23 mg/dL _0 Creatinine 0.44 - 1.00 mg/dL 1.31(H) 1.13(H) 1.12(H)  Sodium 135 - 145 mmol/L 140 136 135  Potassium 3.5 - 5.1 mmol/L 4.2 4.1 4.2  Chloride 98 - 111 mmol/L 102 99(L) 100(L)  CO2 22 - 32 mmol/L _1 Calcium 8.9 - 10.3 mg/dL 9.9 8.9 8.6(L)  Total Protein 6.5 - 8.1 g/dL 8.5(H) - -  Total Bilirubin 0.3 - 1.2 mg/dL 0.4 - -  Alkaline Phos 38 - 126 U/L 78 - -  AST 15 - 41 U/L 18 - -  ALT 0 - 44 U/L 13 - -    RADIOGRAPHIC STUDIES: No results found.  ASSESSMENT & PLAN Diane Daugherty 80 y.o. female with medical history significant for CKD stage III, OSA on CPAP, arthritis, and HTN who presents for evaluation of an M spike on SPEP.   After review of the labs, review of the records, and discussion with the patient the patients findings are most consistent with elevated M protein.  Given that the M spike was 1.7 g/dL this alone would merit a bone marrow biopsy.  We will complete the monoclonal myopathy work-up with additional labs today.  Additionally we will attempt to work-up her anemia to see if there are any other possible etiologies.  # Monoclonal Gammopathy --will complete monoclonal gammopathy workup with SPEP/IFE, Serum free light chains, LDH, and beta 2 microglobulin --UPEP ordered with nephrology, no evidence of monoclonal gammopathy --will order a metastatic survey to assess for lytic lesions --due to M protein >1.5, she will require a a bone marrow biopsy --will work up anemia to assure no alternative causes for the low Hgb.  #  Anemia of Unclear Etiology -- Possible etiologies include renal dysfunction, anemia of chronic disease, nutritional deficiency, or due to myeloma. --will order iron panel, ferritin, vitamin b12, and folate --will order reticulocyte panel and erythropoeitin levels --bone marrow biopsy as above. --RTC pending bone marrow biopsy.   Orders Placed This  Encounter  Procedures   DG Bone Survey Met    Standing Status:   Future    Standing Expiration Date:   09/21/2021    Order Specific Question:   Reason for Exam (SYMPTOM  OR DIAGNOSIS REQUIRED)    Answer:   monoclonal gammopathy, assess for lytic lesions    Order Specific Question:   Preferred imaging location?    Answer:   [facility]   CT BONE MARROW BIOPSY & ASPIRATION    Standing Status:   Future    Standing Expiration Date:   09/21/2021    Order Specific Question:   Reason for Exam (SYMPTOM  OR DIAGNOSIS REQUIRED)    Answer:   concern for multiple myeloma    Order Specific Question:   Preferred location?    Answer:   [facility]   CT Biopsy    Standing Status:   Future    Standing Expiration Date:   09/21/2021    Order Specific Question:   Lab orders requested (DO NOT place separate lab orders, these will be automatically ordered during procedure specimen collection):    Answer:   Surgical Pathology    Order Specific Question:   Reason for Exam (SYMPTOM  OR DIAGNOSIS REQUIRED)    Answer:   Concern for multiple myeloma, requesting bone marrow biopsy    Order Specific Question:   Preferred location?    Answer:   [facility]   CBC with Differential (Cancer Center Only)    Standing Status:   Future    Number of Occurrences:   1    Standing Expiration Date:   09/21/2021   CMP ([facility] only)    Standing Status:   Future    Number of Occurrences:   1    Standing Expiration Date:   09/21/2021   Lactate dehydrogenase (LDH)    Standing Status:   Future    Number of Occurrences:   1    Standing Expiration Date:   09/21/2021   Multiple Myeloma Panel (SPEP&IFE w/QIG)    Standing Status:   Future    Number of Occurrences:   1    Standing Expiration Date:   09/21/2021   Kappa/lambda light chains    Standing Status:   Future    Number of Occurrences:   1    Standing Expiration Date:   09/21/2021   Beta 2 microglobulin    Standing Status:   Future    Number  of Occurrences:   1    Standing Expiration Date:   09/21/2021    All questions were answered. The patient knows to call the clinic with any problems, questions or concerns.  A total of more than 60 minutes were spent on this encounter with face-to-face time and non-face-to-face time, including preparing to see the patient, ordering tests and/or medications, counseling the patient and coordination of care as outlined above.   Ledell Peoples, MD Department of Hematology/Oncology [place] at [facility] Phone: [phone] Pager: [phone] Email: [email or web address]  09/21/2020 4:04 PM

## 2020-09-22 LAB — KAPPA/LAMBDA LIGHT CHAINS
Kappa free light chain: 77.3 mg/L — ABNORMAL HIGH (ref 3.3–19.4)
Kappa, lambda light chain ratio: 11.71 — ABNORMAL HIGH (ref 0.26–1.65)
Lambda free light chains: 6.6 mg/L (ref 5.7–26.3)

## 2020-09-22 LAB — BETA 2 MICROGLOBULIN, SERUM: Beta-2 Microglobulin: 2.9 mg/L — ABNORMAL HIGH (ref 0.6–2.4)

## 2020-09-23 DIAGNOSIS — E7849 Other hyperlipidemia: Secondary | ICD-10-CM | POA: Diagnosis not present

## 2020-09-23 DIAGNOSIS — N183 Chronic kidney disease, stage 3 unspecified: Secondary | ICD-10-CM | POA: Diagnosis not present

## 2020-09-23 DIAGNOSIS — R634 Abnormal weight loss: Secondary | ICD-10-CM | POA: Diagnosis not present

## 2020-09-23 DIAGNOSIS — I10 Essential (primary) hypertension: Secondary | ICD-10-CM | POA: Diagnosis not present

## 2020-09-23 DIAGNOSIS — F411 Generalized anxiety disorder: Secondary | ICD-10-CM | POA: Diagnosis not present

## 2020-09-23 DIAGNOSIS — R42 Dizziness and giddiness: Secondary | ICD-10-CM | POA: Diagnosis not present

## 2020-09-23 DIAGNOSIS — R809 Proteinuria, unspecified: Secondary | ICD-10-CM | POA: Diagnosis not present

## 2020-09-23 DIAGNOSIS — I129 Hypertensive chronic kidney disease with stage 1 through stage 4 chronic kidney disease, or unspecified chronic kidney disease: Secondary | ICD-10-CM | POA: Diagnosis not present

## 2020-09-23 DIAGNOSIS — Z6839 Body mass index (BMI) 39.0-39.9, adult: Secondary | ICD-10-CM | POA: Diagnosis not present

## 2020-09-23 DIAGNOSIS — F32A Depression, unspecified: Secondary | ICD-10-CM | POA: Diagnosis not present

## 2020-09-24 LAB — MULTIPLE MYELOMA PANEL, SERUM
Albumin SerPl Elph-Mcnc: 3.6 g/dL (ref 2.9–4.4)
Albumin/Glob SerPl: 0.9 (ref 0.7–1.7)
Alpha 1: 0.3 g/dL (ref 0.0–0.4)
Alpha2 Glob SerPl Elph-Mcnc: 0.9 g/dL (ref 0.4–1.0)
B-Globulin SerPl Elph-Mcnc: 1 g/dL (ref 0.7–1.3)
Gamma Glob SerPl Elph-Mcnc: 2.1 g/dL — ABNORMAL HIGH (ref 0.4–1.8)
Globulin, Total: 4.3 g/dL — ABNORMAL HIGH (ref 2.2–3.9)
IgA: 36 mg/dL — ABNORMAL LOW (ref 64–422)
IgG (Immunoglobin G), Serum: 2231 mg/dL — ABNORMAL HIGH (ref 586–1602)
IgM (Immunoglobulin M), Srm: 14 mg/dL — ABNORMAL LOW (ref 26–217)
M Protein SerPl Elph-Mcnc: 1.8 g/dL — ABNORMAL HIGH
Total Protein ELP: 7.9 g/dL (ref 6.0–8.5)

## 2020-09-25 ENCOUNTER — Other Ambulatory Visit: Payer: Self-pay

## 2020-09-25 ENCOUNTER — Ambulatory Visit (HOSPITAL_COMMUNITY)
Admission: RE | Admit: 2020-09-25 | Discharge: 2020-09-25 | Disposition: A | Discharge status: Home / Self Care | Payer: Medicare Other | Source: Ambulatory Visit | Attending: Hematology and Oncology | Admitting: Hematology and Oncology

## 2020-09-25 DIAGNOSIS — D472 Monoclonal gammopathy: Secondary | ICD-10-CM | POA: Diagnosis not present

## 2020-09-25 DIAGNOSIS — C9000 Multiple myeloma not having achieved remission: Secondary | ICD-10-CM | POA: Diagnosis not present

## 2020-09-30 ENCOUNTER — Other Ambulatory Visit: Payer: Self-pay | Admitting: Student

## 2020-10-01 ENCOUNTER — Ambulatory Visit (HOSPITAL_COMMUNITY)
Admission: RE | Admit: 2020-10-01 | Discharge: 2020-10-01 | Disposition: A | Discharge status: Home / Self Care | Payer: Medicare Other | Source: Ambulatory Visit | Attending: Hematology and Oncology | Admitting: Hematology and Oncology

## 2020-10-01 ENCOUNTER — Other Ambulatory Visit: Payer: Self-pay

## 2020-10-01 ENCOUNTER — Encounter (HOSPITAL_COMMUNITY): Payer: Self-pay

## 2020-10-01 DIAGNOSIS — Z79899 Other long term (current) drug therapy: Secondary | ICD-10-CM | POA: Diagnosis not present

## 2020-10-01 DIAGNOSIS — Z7901 Long term (current) use of anticoagulants: Secondary | ICD-10-CM | POA: Diagnosis not present

## 2020-10-01 DIAGNOSIS — G4733 Obstructive sleep apnea (adult) (pediatric): Secondary | ICD-10-CM | POA: Insufficient documentation

## 2020-10-01 DIAGNOSIS — D649 Anemia, unspecified: Secondary | ICD-10-CM | POA: Diagnosis not present

## 2020-10-01 DIAGNOSIS — I129 Hypertensive chronic kidney disease with stage 1 through stage 4 chronic kidney disease, or unspecified chronic kidney disease: Secondary | ICD-10-CM | POA: Diagnosis not present

## 2020-10-01 DIAGNOSIS — Z0389 Encounter for observation for other suspected diseases and conditions ruled out: Secondary | ICD-10-CM | POA: Diagnosis not present

## 2020-10-01 DIAGNOSIS — D472 Monoclonal gammopathy: Secondary | ICD-10-CM | POA: Insufficient documentation

## 2020-10-01 DIAGNOSIS — C9000 Multiple myeloma not having achieved remission: Secondary | ICD-10-CM | POA: Insufficient documentation

## 2020-10-01 DIAGNOSIS — C9010 Plasma cell leukemia not having achieved remission: Secondary | ICD-10-CM | POA: Diagnosis not present

## 2020-10-01 DIAGNOSIS — N183 Chronic kidney disease, stage 3 unspecified: Secondary | ICD-10-CM | POA: Diagnosis not present

## 2020-10-01 HISTORY — DX: Chronic kidney disease, unspecified: N18.9

## 2020-10-01 LAB — CBC WITH DIFFERENTIAL/PLATELET
Abs Immature Granulocytes: 0.02 10*3/uL (ref 0.00–0.07)
Basophils Absolute: 0 10*3/uL (ref 0.0–0.1)
Basophils Relative: 0 %
Eosinophils Absolute: 0.2 10*3/uL (ref 0.0–0.5)
Eosinophils Relative: 3 %
HCT: 35.8 % — ABNORMAL LOW (ref 36.0–46.0)
Hemoglobin: 12.1 g/dL (ref 12.0–15.0)
Immature Granulocytes: 0 %
Lymphocytes Relative: 32 %
Lymphs Abs: 2.2 10*3/uL (ref 0.7–4.0)
MCH: 31.8 pg (ref 26.0–34.0)
MCHC: 33.8 g/dL (ref 30.0–36.0)
MCV: 94.2 fL (ref 80.0–100.0)
Monocytes Absolute: 0.3 10*3/uL (ref 0.1–1.0)
Monocytes Relative: 4 %
Neutro Abs: 4.1 10*3/uL (ref 1.7–7.7)
Neutrophils Relative %: 61 %
Platelets: 237 10*3/uL (ref 150–400)
RBC: 3.8 MIL/uL — ABNORMAL LOW (ref 3.87–5.11)
RDW: 14.3 % (ref 11.5–15.5)
WBC: 6.9 10*3/uL (ref 4.0–10.5)
nRBC: 0 % (ref 0.0–0.2)

## 2020-10-01 MED ORDER — FLUMAZENIL 0.5 MG/5ML IV SOLN
INTRAVENOUS | Status: AC
  Filled 2020-10-01: qty 5

## 2020-10-01 MED ORDER — MIDAZOLAM HCL 2 MG/2ML IJ SOLN
INTRAMUSCULAR | Status: AC
  Filled 2020-10-01: qty 4

## 2020-10-01 MED ORDER — NALOXONE HCL 0.4 MG/ML IJ SOLN
INTRAMUSCULAR | Status: AC
  Filled 2020-10-01: qty 1

## 2020-10-01 MED ORDER — FENTANYL CITRATE (PF) 100 MCG/2ML IJ SOLN
INTRAMUSCULAR | Status: DC | PRN
  Administered 2020-10-01: 50 ug via INTRAVENOUS

## 2020-10-01 MED ORDER — FENTANYL CITRATE (PF) 100 MCG/2ML IJ SOLN
INTRAMUSCULAR | Status: AC
  Filled 2020-10-01: qty 4

## 2020-10-01 MED ORDER — SODIUM CHLORIDE 0.9 % IV SOLN: INTRAVENOUS | Status: DC

## 2020-10-01 MED ORDER — MIDAZOLAM HCL 2 MG/2ML IJ SOLN
INTRAMUSCULAR | Status: DC | PRN
  Administered 2020-10-01: 1 mg via INTRAVENOUS

## 2020-10-01 NOTE — H&P (Signed)
Chief Complaint: Patient was seen in consultation today for monoclonal gammopathy  Referring Physician(s): [place] T IV  Supervising Physician: Aletta Edouard  Patient Status: [facility] - Out-pt  History of Present Illness: Diane Daugherty is a 80 y.o. female with past medical history of CKD stage III, HTN, OSA on CPAP who was recently evaluated by Heme/Onc due to M spike on SPEP.  Patient referred to IR for bone marrow biopsy due to chronic anemia and monoclonal gammopathy.   Diane Daugherty presents to [facility] for procedure today.  She has been NPO.  She does not take blood thinners.  She is understanding of the goals of the procedure and is agreeable to proceed.   Past Medical History:  Diagnosis Date   Anemia 09/24/1968   "after childbirth"   Arthritis    "feels like all over"   Carpal tunnel syndrome    Carpal tunnel syndrome    left   Cataract    immature on right   Chronic bronchitis ([place])    "get it most years" (10/08/2014)   Chronic kidney disease    stage III, per Pt   Family history of anesthesia complication    daughter gets sick   History of blood transfusion    "w/one of my back OR's"   Hypertension    takes Hyzaar daily   Joint pain    "all over"   Joint swelling    Muscle spasms of lower extremity    takes Baclofen daily prn   Nocturia    OSA on CPAP    Peripheral vascular disease (HCC)    PFO (patent foramen ovale)    positive saline bubble study 05/12/09 ([facility])   Pneumonia 2010/ 2011   PONV (postoperative nausea and vomiting)    woke up during hysterectomy   Shortness of breath    with exertion;Albuterol prn   Urinary frequency     Past Surgical History:  Procedure Laterality Date   ABDOMINAL HYSTERECTOMY  1970's?   APPENDECTOMY     BACK SURGERY     BREAST BIOPSY Right 4's   [place]; 1963; [place]  80's   d/t pneumothorax   COLONOSCOPY     FRACTURE SURGERY     HARDWARE REMOVAL Right 1990's    "took screw out of my lower leg ~ 1 yr after putting it in"   HEMILAMINOTOMY LUMBAR SPINE  02/2002   L5; decompression of the L5 and S1 nerve root; synovial cyst excision/notes 06/09/2010   KNEE ARTHROSCOPY Right ?2013   LUMBAR LAMINECTOMY/DECOMPRESSION MICRODISCECTOMY  02/2010   POSTERIOR LUMBAR FUSION  05/2003   L4-5 and S-1    TIBIA FRACTURE SURGERY Right 1990's   "put screw in"   TOTAL KNEE ARTHROPLASTY Right 08/28/2012   Procedure: TOTAL KNEE ARTHROPLASTY;  Surgeon: Garald Balding, MD;  Location: [facility];  Service: Orthopedics;  Laterality: Right;   TOTAL KNEE ARTHROPLASTY Left 10/07/2014   TOTAL KNEE ARTHROPLASTY Left 10/07/2014   Procedure: TOTAL KNEE ARTHROPLASTY;  Surgeon: Garald Balding, MD;  Location: [facility];  Service: Orthopedics;  Laterality: Left;    Allergies: Levaquin [levofloxacin], Morphine and related, Other, Oxycodone, and Naprosyn [naproxen]  Medications: Prior to Admission medications   Medication Sig Start Date End Date Taking? Authorizing Provider  acetaminophen (TYLENOL) 650 MG CR tablet Take 1,300 mg by mouth daily.   Yes [provider]  amLODipine (NORVASC) 5 MG tablet Take 5 mg by mouth daily. 08/23/20  Yes [provider]  beta carotene w/minerals (OCUVITE) tablet Take 1 tablet by mouth daily.   Yes [provider]  Calcium Citrate-Vitamin D (CALCIUM CITRATE + D PO) Take 1 tablet by mouth daily.   Yes [provider]  ferrous sulfate 325 (65 FE) MG tablet Take 325 mg by mouth 2 (two) times daily.   Yes [provider]  Javier Docker Oil 300 MG CAPS Take 1 capsule by mouth daily.   Yes [provider]  spironolactone (ALDACTONE) 25 MG tablet Take 1 tablet by mouth daily. 08/17/17  Yes [provider]  baclofen (LIORESAL) 20 MG tablet Take 10 mg by mouth daily as needed (muscle spasms).    [provider]  fluticasone (FLONASE) 50 MCG/ACT nasal spray Place 2 sprays into both nostrils daily as needed  for allergies or rhinitis.    [provider]     History reviewed. No pertinent family history.  Social History   Socioeconomic History   Marital status: Divorced    Spouse name: Not on file   Number of children: Not on file   Years of education: Not on file   Highest education level: Not on file  Occupational History   Not on file  Tobacco Use   Smoking status: Never   Smokeless tobacco: Never  Vaping Use   Vaping Use: Never used  Substance and Sexual Activity   Alcohol use: No   Drug use: No   Sexual activity: Not Currently  Other Topics Concern   Not on file  Social History Narrative   Not on file   Social Determinants of Health   Financial Resource Strain: Not on file  Food Insecurity: Not on file  Transportation Needs: Not on file  Physical Activity: Not on file  Stress: Not on file  Social Connections: Not on file     Review of Systems: A 12 point ROS discussed and pertinent positives are indicated in the HPI above.  All other systems are negative.  Review of Systems  Constitutional:  Positive for fatigue. Negative for fever.  Respiratory:  Negative for cough and shortness of breath.   Cardiovascular:  Negative for chest pain.  Gastrointestinal:  Negative for abdominal pain, diarrhea and nausea.  Genitourinary:  Negative for dysuria.  Musculoskeletal:  Negative for back pain.  Psychiatric/Behavioral:  Negative for behavioral problems and confusion.    Vital Signs: BP (!) 146/72   Pulse 70   Temp 98.1 F (36.7 C) (Oral)   Resp 18   SpO2 99%   Physical Exam Vitals and nursing note reviewed.  Constitutional:      General: She is not in acute distress.    Appearance: Normal appearance. She is not ill-appearing.  HENT:     Mouth/Throat:     Mouth: Mucous membranes are moist.     Pharynx: Oropharynx is clear.  Cardiovascular:     Rate and Rhythm: Normal rate and regular rhythm.  Pulmonary:     Effort: Pulmonary effort is normal. No  respiratory distress.     Breath sounds: Normal breath sounds.  Abdominal:     General: Abdomen is flat.     Palpations: Abdomen is soft.  Musculoskeletal:        General: Normal range of motion.  Skin:    General: Skin is warm and dry.  Neurological:     General: No focal deficit present.     Mental Status: She is alert and oriented to person, place, and time.  Mental status is at baseline.  Psychiatric:        Mood and Affect: Mood normal.        Behavior: Behavior normal.        Thought Content: Thought content normal.        Judgment: Judgment normal.     MD Evaluation Airway: WNL Heart: WNL Abdomen: WNL Chest/ Lungs: WNL ASA  Classification: 3 Mallampati/Airway Score: Two   Imaging: DG Bone Survey Met  Result Date: 09/28/2020 CLINICAL DATA:  Monoclonal gammopathy, assess for lytic lesions. EXAM: METASTATIC BONE SURVEY COMPARISON:  Multiple priors including chest radiograph October 04/13/2015 and thoracic spine radiograph October 30, 2015. FINDINGS: Dental hardware.  No lytic lesions visualized in the calvarium. Lungs are clear.  No pleural effusion or pneumothorax. Degenerative changes bilateral shoulders and acromioclavicular joints. Elevation of the right humeral head in relation to the glenoid. No lytic lesions visualized within the upper extremities. Multilevel degenerative changes spine. Lumbosacral posterior fusion hardware and intervertebral disc spacers. No lytic spinal lesion visualized. Bilateral total knee arthroplasties. Lucent lesions in the left greater than right proximal tibia, inferior to the tibial stem. IMPRESSION: Lucent lesions in the left greater than right proximal tibia, inferior to the tibial stem component of the arthroplasty hardware, nonspecific. Consider further evaluation with dedicated left knee MRI. Otherwise, no lytic lesions identified in the visualized axial or appendicular skeleton. Electronically Signed   By: Dahlia Bailiff M.D.   On: 09/28/2020  16:38    Labs:  CBC: Recent Labs    09/21/20 1435 10/01/20 0954  WBC 8.4 6.9  HGB 12.1 12.1  HCT 35.6* 35.8*  PLT 272 237    COAGS: No results for input(s): INR, APTT in the last 8760 hours.  BMP: Recent Labs    09/21/20 1435  NA 140  K 4.2  CL 102  CO2 28  GLUCOSE 89  BUN 11  CALCIUM 9.9  CREATININE 1.31*  GFRNONAA 42*    LIVER FUNCTION TESTS: Recent Labs    09/21/20 1435  BILITOT 0.4  AST 18  ALT 13  ALKPHOS 78  PROT 8.5*  ALBUMIN 3.8    TUMOR MARKERS: No results for input(s): AFPTM, CEA, CA199, CHROMGRNA in the last 8760 hours.  Assessment and Plan: Patient with past medical history of HTN, OSA on CPAP, CKD III presents with complaint of chronic anemia, monoclonal gammopathy/elevated M spike.  IR consulted for bone marrow biopsy at the request of Dr. Lorenso Courier IV. Case reviewed by Dr. Kathlene Cote who approves patient for procedure.  Patient presents today in their usual state of health.  She has been NPO and is not currently on blood thinners.   Risks and benefits of biopsy was discussed with the patient and/or patient's family including, but not limited to bleeding, infection, damage to adjacent structures or low yield requiring additional tests.  All of the questions were answered and there is agreement to proceed.  Consent signed and in chart.  Thank you for this interesting consult.  I greatly enjoyed meeting HYUN REALI and look forward to participating in their care.  A copy of this report was sent to the requesting provider on this date.  Electronically Signed: Docia Barrier, PA 10/01/2020, 10:44 AM   I spent a total of  30 Minutes   in face to face in clinical consultation, greater than 50% of which was counseling/coordinating care for monoclonal gammopathy.

## 2020-10-01 NOTE — Procedures (Signed)
Vascular and Interventional Radiology Procedure Note  Patient: Diane Daugherty DOB: April 26, 1940 Medical Record Number: [id number] Note Date/Time: 10/01/20 11:46 AM   Performing Physician: Michaelle Birks, MD Assistant(s): None  Diagnosis: MGUS  Procedure: BONE MARROW ASPIRATION AND BIOPSY  Anesthesia: Conscious Sedation Complications: None Estimated Blood Loss: Minimal Specimens: Sent for Pathology  Findings:  Successful CT-guided bone marrow biopsy A total of 1 cores were obtained. Hemostasis of the tract was achieved using Manual Pressure.  Plan: Bed rest for 1 hours.  See detailed procedure note with images in PACS. The patient tolerated the procedure well without incident or complication and was returned to Recovery in stable condition.    Michaelle Birks, MD [facility] Interventional [facility]   Pager. [place]

## 2020-10-01 NOTE — Discharge Instructions (Signed)
Urgent needs - Interventional Radiology on call MD [phone]  Wound - May remove dressing and shower tomorrow.  Keep site clean and dry.  Replace with bandaid as needed.  Do not submerge in tub or water until site healing well. If closed with glue, glue will flake off on its own.   

## 2020-10-12 ENCOUNTER — Encounter (HOSPITAL_COMMUNITY): Payer: Self-pay | Admitting: Hematology and Oncology

## 2020-10-18 ENCOUNTER — Other Ambulatory Visit: Payer: Self-pay | Admitting: Hematology and Oncology

## 2020-10-18 DIAGNOSIS — D472 Monoclonal gammopathy: Secondary | ICD-10-CM

## 2020-10-19 ENCOUNTER — Inpatient Hospital Stay: Payer: Medicare Other | Attending: Hematology and Oncology

## 2020-10-19 ENCOUNTER — Inpatient Hospital Stay (HOSPITAL_BASED_OUTPATIENT_CLINIC_OR_DEPARTMENT_OTHER): Payer: Medicare Other | Admitting: Hematology and Oncology

## 2020-10-19 ENCOUNTER — Other Ambulatory Visit: Payer: Self-pay

## 2020-10-19 VITALS — BP 186/91 | HR 93 | Temp 97.8°F | Resp 18 | Wt 214.2 lb

## 2020-10-19 DIAGNOSIS — I129 Hypertensive chronic kidney disease with stage 1 through stage 4 chronic kidney disease, or unspecified chronic kidney disease: Secondary | ICD-10-CM | POA: Diagnosis not present

## 2020-10-19 DIAGNOSIS — D472 Monoclonal gammopathy: Secondary | ICD-10-CM | POA: Diagnosis not present

## 2020-10-19 DIAGNOSIS — Z90710 Acquired absence of both cervix and uterus: Secondary | ICD-10-CM | POA: Diagnosis not present

## 2020-10-19 DIAGNOSIS — C9000 Multiple myeloma not having achieved remission: Secondary | ICD-10-CM | POA: Diagnosis not present

## 2020-10-19 DIAGNOSIS — G4733 Obstructive sleep apnea (adult) (pediatric): Secondary | ICD-10-CM | POA: Diagnosis not present

## 2020-10-19 DIAGNOSIS — Z79899 Other long term (current) drug therapy: Secondary | ICD-10-CM | POA: Diagnosis not present

## 2020-10-19 DIAGNOSIS — N189 Chronic kidney disease, unspecified: Secondary | ICD-10-CM | POA: Diagnosis not present

## 2020-10-19 LAB — CMP (CANCER CENTER ONLY)
ALT: 13 U/L (ref 0–44)
AST: 17 U/L (ref 15–41)
Albumin: 3.9 g/dL (ref 3.5–5.0)
Alkaline Phosphatase: 69 U/L (ref 38–126)
Anion gap: 9 (ref 5–15)
BUN: 15 mg/dL (ref 8–23)
CO2: 26 mmol/L (ref 22–32)
Calcium: 10 mg/dL (ref 8.9–10.3)
Chloride: 105 mmol/L (ref 98–111)
Creatinine: 1.17 mg/dL — ABNORMAL HIGH (ref 0.44–1.00)
GFR, Estimated: 48 mL/min — ABNORMAL LOW (ref >60)
Glucose, Bld: 88 mg/dL (ref 70–99)
Potassium: 4.2 mmol/L (ref 3.5–5.1)
Sodium: 140 mmol/L (ref 135–145)
Total Bilirubin: 0.5 mg/dL (ref 0.3–1.2)
Total Protein: 8.2 g/dL — ABNORMAL HIGH (ref 6.5–8.1)

## 2020-10-19 LAB — CBC WITH DIFFERENTIAL (CANCER CENTER ONLY)
Abs Immature Granulocytes: 0.02 10*3/uL (ref 0.00–0.07)
Basophils Absolute: 0 10*3/uL (ref 0.0–0.1)
Basophils Relative: 0 %
Eosinophils Absolute: 0.2 10*3/uL (ref 0.0–0.5)
Eosinophils Relative: 3 %
HCT: 32.9 % — ABNORMAL LOW (ref 36.0–46.0)
Hemoglobin: 11.4 g/dL — ABNORMAL LOW (ref 12.0–15.0)
Immature Granulocytes: 0 %
Lymphocytes Relative: 32 %
Lymphs Abs: 2.3 10*3/uL (ref 0.7–4.0)
MCH: 31.2 pg (ref 26.0–34.0)
MCHC: 34.7 g/dL (ref 30.0–36.0)
MCV: 90.1 fL (ref 80.0–100.0)
Monocytes Absolute: 0.4 10*3/uL (ref 0.1–1.0)
Monocytes Relative: 6 %
Neutro Abs: 4.3 10*3/uL (ref 1.7–7.7)
Neutrophils Relative %: 59 %
Platelet Count: 228 10*3/uL (ref 150–400)
RBC: 3.65 MIL/uL — ABNORMAL LOW (ref 3.87–5.11)
RDW: 14.4 % (ref 11.5–15.5)
WBC Count: 7.2 10*3/uL (ref 4.0–10.5)
nRBC: 0 % (ref 0.0–0.2)

## 2020-10-19 LAB — SURGICAL PATHOLOGY

## 2020-10-20 LAB — KAPPA/LAMBDA LIGHT CHAINS
Kappa free light chain: 70.9 mg/L — ABNORMAL HIGH (ref 3.3–19.4)
Kappa, lambda light chain ratio: 9.99 — ABNORMAL HIGH (ref 0.26–1.65)
Lambda free light chains: 7.1 mg/L (ref 5.7–26.3)

## 2020-10-20 NOTE — Progress Notes (Signed)
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 Lorenso Courier. Kappa 77.3, Lambda 6.6, ratio 11.71.  09/25/2020: met survey shows no clear lytic lesions 10/01/2020: BmBx showed mildly hypercellular marrow involved by kappa-restricted plasma cell neoplasm (20%)  Interval History:  Diane Daugherty 80 y.o. female with medical history significant for smoldering multiple myeloma who presents for a follow up visit. The patient's last visit was on 09/21/2020. In the interim since the last visit she underwent a bone marrow biopsy which confirmed smoldering multiple myeloma.   On exam today Diane Daugherty notes that she feels quite well.  She notes that the bone marrow biopsy procedure did not go quite as well as she had hoped.  She notes that she was fully conscious throughout the procedure and was able to feel some discomfort during it.  She has that she does not have any residual bleeding, bruising, or pain.  She reports that her energy levels have been about the same and she is experiencing no shortness of breath.  She currently denies any fevers, chills, sweats, nausea, vomiting or diarrhea.  A full 10 point ROS is listed below.  MEDICAL HISTORY:  Past Medical History:  Diagnosis Date   Anemia 09/24/1968   "after childbirth"   Arthritis    "feels like all over"   Carpal tunnel syndrome    Carpal tunnel syndrome    left   Cataract    immature on right   Chronic bronchitis ([place])    "get it most years" (10/08/2014)   Chronic kidney disease    stage III, per Pt   Family history of anesthesia complication    daughter gets sick   History of blood transfusion    "w/one of my back OR's"   Hypertension    takes Hyzaar daily    Joint pain    "all over"   Joint swelling    Muscle spasms of lower extremity    takes Baclofen daily prn   Nocturia    OSA on CPAP    Peripheral vascular disease (HCC)    PFO (patent foramen ovale)    positive saline bubble study 05/12/09 ([place])   Pneumonia 2010/ 2011   PONV (postoperative nausea and vomiting)    woke up during hysterectomy   Shortness of breath    with exertion;Albuterol prn   Urinary frequency     SURGICAL HISTORY: Past Surgical History:  Procedure Laterality Date   ABDOMINAL HYSTERECTOMY  1970's?   APPENDECTOMY     BACK SURGERY     BREAST BIOPSY Right 51's   [place]; 1963; [place]  80's   d/t pneumothorax   COLONOSCOPY     FRACTURE SURGERY     HARDWARE REMOVAL Right 1990's   "took screw out of my lower leg ~ 1 yr after putting it in"   HEMILAMINOTOMY LUMBAR SPINE  02/2002   L5; decompression of the L5 and S1 nerve root; synovial cyst excision/notes 06/09/2010   KNEE ARTHROSCOPY Right ?2013   LUMBAR LAMINECTOMY/DECOMPRESSION MICRODISCECTOMY  02/2010   POSTERIOR LUMBAR FUSION  05/2003   L4-5 and S-1    TIBIA FRACTURE SURGERY Right 1990's   "put screw in"   TOTAL KNEE ARTHROPLASTY Right 08/28/2012  Procedure: TOTAL KNEE ARTHROPLASTY;  Surgeon: Garald Balding, MD;  Location: [facility];  Service: Orthopedics;  Laterality: Right;   TOTAL KNEE ARTHROPLASTY Left 10/07/2014   TOTAL KNEE ARTHROPLASTY Left 10/07/2014   Procedure: TOTAL KNEE ARTHROPLASTY;  Surgeon: Garald Balding, MD;  Location: [facility];  Service: Orthopedics;  Laterality: Left;    SOCIAL HISTORY: Social History   Socioeconomic History   Marital status: Divorced    Spouse name: Not on file   Number of children: Not on file   Years of education: Not on file   Highest education level: Not on file  Occupational History   Not on file  Tobacco Use   Smoking status: Never   Smokeless tobacco: Never  Vaping Use   Vaping Use: Never used  Substance and  Sexual Activity   Alcohol use: No   Drug use: No   Sexual activity: Not Currently  Other Topics Concern   Not on file  Social History Narrative   Not on file   Social Determinants of Health   Financial Resource Strain: Not on file  Food Insecurity: Not on file  Transportation Needs: Not on file  Physical Activity: Not on file  Stress: Not on file  Social Connections: Not on file  Intimate Partner Violence: Not on file    FAMILY HISTORY: No family history on file.  ALLERGIES:  is allergic to levaquin [levofloxacin], morphine and related, other, oxycodone, and naprosyn [naproxen].  MEDICATIONS:  Current Outpatient Medications  Medication Sig Dispense Refill   acetaminophen (TYLENOL) 650 MG CR tablet Take 1,300 mg by mouth daily.     amLODipine (NORVASC) 5 MG tablet Take 5 mg by mouth daily.     baclofen (LIORESAL) 20 MG tablet Take 10 mg by mouth daily as needed (muscle spasms).     beta carotene w/minerals (OCUVITE) tablet Take 1 tablet by mouth daily.     Calcium Citrate-Vitamin D (CALCIUM CITRATE + D PO) Take 1 tablet by mouth daily.     ferrous sulfate 325 (65 FE) MG tablet Take 325 mg by mouth 2 (two) times daily.     fluticasone (FLONASE) 50 MCG/ACT nasal spray Place 2 sprays into both nostrils daily as needed for allergies or rhinitis.     Krill Oil 300 MG CAPS Take 1 capsule by mouth daily.     spironolactone (ALDACTONE) 25 MG tablet Take 1 tablet by mouth daily.     No current facility-administered medications for this visit.    REVIEW OF SYSTEMS:   Constitutional: ( - ) fevers, ( - )  chills , ( - ) night sweats Eyes: ( - ) blurriness of vision, ( - ) double vision, ( - ) watery eyes Ears, nose, mouth, throat, and face: ( - ) mucositis, ( - ) sore throat Respiratory: ( - ) cough, ( - ) dyspnea, ( - ) wheezes Cardiovascular: ( - ) palpitation, ( - ) chest discomfort, ( - ) lower extremity swelling Gastrointestinal:  ( - ) nausea, ( - ) heartburn, ( - ) change in  bowel habits Skin: ( - ) abnormal skin rashes Lymphatics: ( - ) new lymphadenopathy, ( - ) easy bruising Neurological: ( - ) numbness, ( - ) tingling, ( - ) new weaknesses Behavioral/Psych: ( - ) mood change, ( - ) new changes  All other systems were reviewed with the patient and are negative.  PHYSICAL EXAMINATION:  Vitals:   10/19/20 1033  BP: (!) 186/91  Pulse: 93  Resp: 18  Temp: 97.8 F (36.6 C)  SpO2: 99%   Filed Weights   10/19/20 1033  Weight: 214 lb 3.2 oz (97.2 kg)    GENERAL: Well-appearing elderly African-American female, alert, no distress and comfortable SKIN: skin color, texture, turgor are normal, no rashes or significant lesions EYES: conjunctiva are pink and non-injected, sclera clear LUNGS: clear to auscultation and percussion with normal breathing effort HEART: regular rate & rhythm and no murmurs and no lower extremity edema Musculoskeletal: no cyanosis of digits and no clubbing  PSYCH: alert & oriented x 3, fluent speech NEURO: no focal motor/sensory deficits  LABORATORY DATA:  I have reviewed the data as listed CBC Latest Ref Rng & Units 10/19/2020 20-Oct-2020 09/21/2020  WBC 4.0 - 10.5 K/uL 7.2 6.9 8.4  Hemoglobin 12.0 - 15.0 g/dL 11.4(L) 12.1 12.1  Hematocrit 36.0 - 46.0 % 32.9(L) 35.8(L) 35.6(L)  Platelets 150 - 400 K/uL 228 237 272    CMP Latest Ref Rng & Units 10/19/2020 09/21/2020 10/10/2014  Glucose 70 - 99 mg/dL 88 89 116(H)  BUN 8 - 23 mg/dL _0 Creatinine 0.44 - 1.00 mg/dL 1.17(H) 1.31(H) 1.13(H)  Sodium 135 - 145 mmol/L 140 140 136  Potassium 3.5 - 5.1 mmol/L 4.2 4.2 4.1  Chloride 98 - 111 mmol/L 105 102 99(L)  CO2 22 - 32 mmol/L _1 Calcium 8.9 - 10.3 mg/dL 10.0 9.9 8.9  Total Protein 6.5 - 8.1 g/dL 8.2(H) 8.5(H) -  Total Bilirubin 0.3 - 1.2 mg/dL 0.5 0.4 -  Alkaline Phos 38 - 126 U/L 69 78 -  AST 15 - 41 U/L 17 18 -  ALT 0 - 44 U/L 13 13 -    Lab Results  Component Value Date   MPROTEIN 1.8 (H) 09/21/2020   Lab  Results  Component Value Date   KPAFRELGTCHN 77.3 (H) 09/21/2020   LAMBDASER 6.6 09/21/2020   KAPLAMBRATIO 11.71 (H) 09/21/2020     RADIOGRAPHIC STUDIES: CT Biopsy  Result Date: 2020/10/20 INDICATION: Concern for multiple myeloma. EXAM: CT GUIDED BONE MARROW ASPIRATION AND CORE BIOPSY MEDICATIONS: None. ANESTHESIA/SEDATION: Moderate (conscious) sedation was employed during this procedure. A total of 2 milligrams versed and 100 micrograms fentanyl were administered intravenously. The patient's level of consciousness and vital signs were monitored continuously by radiology nursing throughout the procedure under my direct supervision. Total monitored sedation time: 17 minutes FLUOROSCOPY TIME:  CT dose was not reported. COMPLICATIONS: None immediate. Estimated blood loss: <5 mL PROCEDURE: Informed written consent was obtained from the patient after a thorough discussion of the procedural risks, benefits and alternatives. All questions were addressed. Maximal Sterile Barrier Technique was utilized including caps, mask, sterile gowns, sterile gloves, sterile drape, hand hygiene and skin antiseptic. A timeout was performed prior to the initiation of the procedure. The patient was positioned prone and non-contrast localization CT was performed of the pelvis to demonstrate the iliac marrow spaces. Maximal barrier sterile technique utilized including caps, mask, sterile gowns, sterile gloves, large sterile drape, hand hygiene, and chlorhexidine prep. Under sterile conditions and local anesthesia, an 11 gauge coaxial bone biopsy needle was advanced into the RIGHT iliac marrow space. Needle position was confirmed with CT imaging. Initially, bone marrow aspiration was performed. Next, the 11 gauge outer cannula was utilized to obtain a RIGHT iliac bone marrow core biopsy. Needle was removed. Hemostasis was obtained with compression. The patient tolerated the procedure well. Samples were prepared with the  cytotechnologist. IMPRESSION: Successful CT-guided bone marrow aspiration  and biopsy, as above. Michaelle Birks, MD [facility] Radiology Electronically Signed   By: Michaelle Birks M.D.   On: 10/01/2020 12:31   DG Bone Survey Met  Result Date: 09/28/2020 CLINICAL DATA:  Monoclonal gammopathy, assess for lytic lesions. EXAM: METASTATIC BONE SURVEY COMPARISON:  Multiple priors including chest radiograph October 04/13/2015 and thoracic spine radiograph October 30, 2015. FINDINGS: Dental hardware.  No lytic lesions visualized in the calvarium. Lungs are clear.  No pleural effusion or pneumothorax. Degenerative changes bilateral shoulders and acromioclavicular joints. Elevation of the right humeral head in relation to the glenoid. No lytic lesions visualized within the upper extremities. Multilevel degenerative changes spine. Lumbosacral posterior fusion hardware and intervertebral disc spacers. No lytic spinal lesion visualized. Bilateral total knee arthroplasties. Lucent lesions in the left greater than right proximal tibia, inferior to the tibial stem. IMPRESSION: Lucent lesions in the left greater than right proximal tibia, inferior to the tibial stem component of the arthroplasty hardware, nonspecific. Consider further evaluation with dedicated left knee MRI. Otherwise, no lytic lesions identified in the visualized axial or appendicular skeleton. Electronically Signed   By: Dahlia Bailiff M.D.   On: 09/28/2020 16:38   CT BONE MARROW BIOPSY & ASPIRATION  Result Date: 10/01/2020 INDICATION: Concern for multiple myeloma. EXAM: CT GUIDED BONE MARROW ASPIRATION AND CORE BIOPSY MEDICATIONS: None. ANESTHESIA/SEDATION: Moderate (conscious) sedation was employed during this procedure. A total of 2 milligrams versed and 100 micrograms fentanyl were administered intravenously. The patient's level of consciousness and vital signs were monitored continuously by radiology nursing  throughout the procedure under my direct supervision. Total monitored sedation time: 17 minutes FLUOROSCOPY TIME:  CT dose was not reported. COMPLICATIONS: None immediate. Estimated blood loss: <5 mL PROCEDURE: Informed written consent was obtained from the patient after a thorough discussion of the procedural risks, benefits and alternatives. All questions were addressed. Maximal Sterile Barrier Technique was utilized including caps, mask, sterile gowns, sterile gloves, sterile drape, hand hygiene and skin antiseptic. A timeout was performed prior to the initiation of the procedure. The patient was positioned prone and non-contrast localization CT was performed of the pelvis to demonstrate the iliac marrow spaces. Maximal barrier sterile technique utilized including caps, mask, sterile gowns, sterile gloves, large sterile drape, hand hygiene, and chlorhexidine prep. Under sterile conditions and local anesthesia, an 11 gauge coaxial bone biopsy needle was advanced into the RIGHT iliac marrow space. Needle position was confirmed with CT imaging. Initially, bone marrow aspiration was performed. Next, the 11 gauge outer cannula was utilized to obtain a RIGHT iliac bone marrow core biopsy. Needle was removed. Hemostasis was obtained with compression. The patient tolerated the procedure well. Samples were prepared with the cytotechnologist. IMPRESSION: Successful CT-guided bone marrow aspiration and biopsy, as above. Michaelle Birks, MD [facility] Radiology Electronically Signed   By: Michaelle Birks M.D.   On: 10/01/2020 12:31    ASSESSMENT & PLAN Diane Daugherty 80 y.o. female with medical history significant for smoldering multiple myeloma who presents for a follow up visit.  Today we discussed the diagnosis of smoldering multiple myeloma.  Smoldering multiple myeloma is a precursor condition which can lead to multiple myeloma.  The rate of conversion to multiple myeloma  is 10 %/year.  Treatment can be considered appropriate and smoldering multiple myeloma cases that meet the 20-2-20 criteria.  This criteria includes 20% plasma cells on bone marrow biopsy, M protein greater than 2, and a serum free light chain  ratio of greater than 20.  Currently the patient only meets 1 of these criteria.  As such I do believe that observation is appropriate.  We will plan to see the patient back in 3 months time for close monitoring of her blood counts.  #Smoldering Multiple Myeloma -- The patient currently meets one of the three 20-2-20 criteria (bmbx with 20% plasma cells) -- No clear indication for treatment as long as the patient does not meet this criteria or any of the crab criteria --At each visit we will order SPEP, UPEP, serum free light chains, and LDH as well as CMP and CBC --Sure imaging of the bones at least once per year.  Last metastatic survey earlier this month. --Recommend follow-up visit in 3 months time  No orders of the defined types were placed in this encounter.   All questions were answered. The patient knows to call the clinic with any problems, questions or concerns.  A total of more than 30 minutes were spent on this encounter with face-to-face time and non-face-to-face time, including preparing to see the patient, ordering tests and/or medications, counseling the patient and coordination of care as outlined above.   Ledell Peoples, MD Department of Hematology/Oncology [place] at [facility] Phone: [phone] Pager: [phone] Email: [email or web address]  10/20/2020 1:46 PM

## 2020-10-21 LAB — BETA 2 MICROGLOBULIN, SERUM: Beta-2 Microglobulin: 2.7 mg/L — ABNORMAL HIGH (ref 0.6–2.4)

## 2020-10-22 LAB — MULTIPLE MYELOMA PANEL, SERUM
Albumin SerPl Elph-Mcnc: 3.8 g/dL (ref 2.9–4.4)
Albumin/Glob SerPl: 1 (ref 0.7–1.7)
Alpha 1: 0.2 g/dL (ref 0.0–0.4)
Alpha2 Glob SerPl Elph-Mcnc: 0.8 g/dL (ref 0.4–1.0)
B-Globulin SerPl Elph-Mcnc: 0.9 g/dL (ref 0.7–1.3)
Gamma Glob SerPl Elph-Mcnc: 2 g/dL — ABNORMAL HIGH (ref 0.4–1.8)
Globulin, Total: 3.9 g/dL (ref 2.2–3.9)
IgA: 39 mg/dL — ABNORMAL LOW (ref 64–422)
IgG (Immunoglobin G), Serum: 2187 mg/dL — ABNORMAL HIGH (ref 586–1602)
IgM (Immunoglobulin M), Srm: 16 mg/dL — ABNORMAL LOW (ref 26–217)
M Protein SerPl Elph-Mcnc: 1.8 g/dL — ABNORMAL HIGH
Total Protein ELP: 7.7 g/dL (ref 6.0–8.5)

## 2020-11-11 DIAGNOSIS — Z23 Encounter for immunization: Secondary | ICD-10-CM | POA: Diagnosis not present

## 2021-01-22 ENCOUNTER — Inpatient Hospital Stay: Payer: Medicare Other

## 2021-01-22 DIAGNOSIS — I129 Hypertensive chronic kidney disease with stage 1 through stage 4 chronic kidney disease, or unspecified chronic kidney disease: Secondary | ICD-10-CM | POA: Diagnosis not present

## 2021-01-22 DIAGNOSIS — N183 Chronic kidney disease, stage 3 unspecified: Secondary | ICD-10-CM | POA: Diagnosis not present

## 2021-01-22 DIAGNOSIS — R42 Dizziness and giddiness: Secondary | ICD-10-CM | POA: Diagnosis not present

## 2021-01-22 DIAGNOSIS — E7849 Other hyperlipidemia: Secondary | ICD-10-CM | POA: Diagnosis not present

## 2021-01-29 ENCOUNTER — Inpatient Hospital Stay: Payer: Medicare Other | Attending: Hematology and Oncology

## 2021-01-29 ENCOUNTER — Ambulatory Visit: Payer: Medicare Other | Admitting: Hematology and Oncology

## 2021-01-29 ENCOUNTER — Other Ambulatory Visit: Payer: Self-pay

## 2021-01-29 ENCOUNTER — Other Ambulatory Visit: Payer: Self-pay | Admitting: *Deleted

## 2021-01-29 DIAGNOSIS — M25569 Pain in unspecified knee: Secondary | ICD-10-CM | POA: Diagnosis not present

## 2021-01-29 DIAGNOSIS — Z79899 Other long term (current) drug therapy: Secondary | ICD-10-CM | POA: Insufficient documentation

## 2021-01-29 DIAGNOSIS — D472 Monoclonal gammopathy: Secondary | ICD-10-CM

## 2021-01-29 DIAGNOSIS — C9000 Multiple myeloma not having achieved remission: Secondary | ICD-10-CM | POA: Diagnosis not present

## 2021-01-29 LAB — CBC WITH DIFFERENTIAL (CANCER CENTER ONLY)
Abs Immature Granulocytes: 0.01 10*3/uL (ref 0.00–0.07)
Basophils Absolute: 0 10*3/uL (ref 0.0–0.1)
Basophils Relative: 0 %
Eosinophils Absolute: 0.2 10*3/uL (ref 0.0–0.5)
Eosinophils Relative: 3 %
HCT: 34.5 % — ABNORMAL LOW (ref 36.0–46.0)
Hemoglobin: 11.8 g/dL — ABNORMAL LOW (ref 12.0–15.0)
Immature Granulocytes: 0 %
Lymphocytes Relative: 32 %
Lymphs Abs: 2.3 10*3/uL (ref 0.7–4.0)
MCH: 31.3 pg (ref 26.0–34.0)
MCHC: 34.2 g/dL (ref 30.0–36.0)
MCV: 91.5 fL (ref 80.0–100.0)
Monocytes Absolute: 0.3 10*3/uL (ref 0.1–1.0)
Monocytes Relative: 4 %
Neutro Abs: 4.5 10*3/uL (ref 1.7–7.7)
Neutrophils Relative %: 61 %
Platelet Count: 228 10*3/uL (ref 150–400)
RBC: 3.77 MIL/uL — ABNORMAL LOW (ref 3.87–5.11)
RDW: 14.6 % (ref 11.5–15.5)
WBC Count: 7.3 10*3/uL (ref 4.0–10.5)
nRBC: 0 % (ref 0.0–0.2)

## 2021-01-29 LAB — CMP (CANCER CENTER ONLY)
ALT: 12 U/L (ref 0–44)
AST: 16 U/L (ref 15–41)
Albumin: 4.1 g/dL (ref 3.5–5.0)
Alkaline Phosphatase: 62 U/L (ref 38–126)
Anion gap: 5 (ref 5–15)
BUN: 20 mg/dL (ref 8–23)
CO2: 28 mmol/L (ref 22–32)
Calcium: 9.7 mg/dL (ref 8.9–10.3)
Chloride: 104 mmol/L (ref 98–111)
Creatinine: 1.12 mg/dL — ABNORMAL HIGH (ref 0.44–1.00)
GFR, Estimated: 50 mL/min — ABNORMAL LOW (ref >60)
Glucose, Bld: 90 mg/dL (ref 70–99)
Potassium: 4.4 mmol/L (ref 3.5–5.1)
Sodium: 137 mmol/L (ref 135–145)
Total Bilirubin: 0.6 mg/dL (ref 0.3–1.2)
Total Protein: 8.3 g/dL — ABNORMAL HIGH (ref 6.5–8.1)

## 2021-01-29 LAB — LACTATE DEHYDROGENASE: LDH: 148 U/L (ref 98–192)

## 2021-02-01 LAB — KAPPA/LAMBDA LIGHT CHAINS
Kappa free light chain: 69.9 mg/L — ABNORMAL HIGH (ref 3.3–19.4)
Kappa, lambda light chain ratio: 8.85 — ABNORMAL HIGH (ref 0.26–1.65)
Lambda free light chains: 7.9 mg/L (ref 5.7–26.3)

## 2021-02-05 ENCOUNTER — Inpatient Hospital Stay (HOSPITAL_BASED_OUTPATIENT_CLINIC_OR_DEPARTMENT_OTHER): Payer: Medicare Other | Admitting: Hematology and Oncology

## 2021-02-05 ENCOUNTER — Encounter: Payer: Self-pay | Admitting: Hematology and Oncology

## 2021-02-05 ENCOUNTER — Other Ambulatory Visit: Payer: Self-pay

## 2021-02-05 VITALS — BP 161/74 | HR 79 | Temp 98.0°F | Resp 17 | Ht 61.0 in | Wt 207.0 lb

## 2021-02-05 DIAGNOSIS — Z79899 Other long term (current) drug therapy: Secondary | ICD-10-CM | POA: Diagnosis not present

## 2021-02-05 DIAGNOSIS — M25569 Pain in unspecified knee: Secondary | ICD-10-CM | POA: Diagnosis not present

## 2021-02-05 DIAGNOSIS — D472 Monoclonal gammopathy: Secondary | ICD-10-CM

## 2021-02-05 DIAGNOSIS — C9000 Multiple myeloma not having achieved remission: Secondary | ICD-10-CM | POA: Diagnosis not present

## 2021-02-05 LAB — MULTIPLE MYELOMA PANEL, SERUM
Albumin SerPl Elph-Mcnc: 3.6 g/dL (ref 2.9–4.4)
Albumin/Glob SerPl: 0.9 (ref 0.7–1.7)
Alpha 1: 0.3 g/dL (ref 0.0–0.4)
Alpha2 Glob SerPl Elph-Mcnc: 0.8 g/dL (ref 0.4–1.0)
B-Globulin SerPl Elph-Mcnc: 0.9 g/dL (ref 0.7–1.3)
Gamma Glob SerPl Elph-Mcnc: 2 g/dL — ABNORMAL HIGH (ref 0.4–1.8)
Globulin, Total: 4.1 g/dL — ABNORMAL HIGH (ref 2.2–3.9)
IgA: 32 mg/dL — ABNORMAL LOW (ref 64–422)
IgG (Immunoglobin G), Serum: 2245 mg/dL — ABNORMAL HIGH (ref 586–1602)
IgM (Immunoglobulin M), Srm: 16 mg/dL — ABNORMAL LOW (ref 26–217)
M Protein SerPl Elph-Mcnc: 1.7 g/dL — ABNORMAL HIGH
Total Protein ELP: 7.7 g/dL (ref 6.0–8.5)

## 2021-02-05 NOTE — Progress Notes (Signed)
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 Lorenso Courier. Kappa 77.3, Lambda 6.6, ratio 11.71.  09/25/2020: met survey shows no clear lytic lesions 10/01/2020: BmBx showed mildly hypercellular marrow involved by kappa-restricted plasma cell neoplasm (20%)  Interval History:  Diane Daugherty 81 y.o. female with medical history significant for smoldering multiple myeloma who presents for a follow up visit. The patient's last visit was on 10/19/2020. In the interim since the last visit   On exam today Diane Daugherty notes she feels well overall.  She had a good holiday season.  She is having more pain in her left knee and reports that "the left side is my problem".  She does that she is easily fatigued.  She does have a relatively good appetite though she has lost 7 pounds unintentionally in the interim since her last visit.  She recently began taking an over-the-counter iron supplementation 27 mg p.o. daily.  She also notes that she is interested in establishing with a primary care provider today.  Otherwise she is having no new focal symptoms.  She has that she does not have any residual bleeding, bruising, or pain.  She reports that her energy levels have been about the same and she is experiencing no shortness of breath.  She currently denies any fevers, chills, sweats, nausea, vomiting or diarrhea.  A full 10 point ROS is listed below.  MEDICAL HISTORY:  Past Medical History:  Diagnosis Date   Anemia 09/24/1968   "after childbirth"   Arthritis    "feels like all over"   Carpal tunnel syndrome    Carpal tunnel syndrome    left   Cataract    immature on right   Chronic bronchitis ([place])    "get it most years"  (10/08/2014)   Chronic kidney disease    stage III, per Pt   Family history of anesthesia complication    daughter gets sick   History of blood transfusion    "w/one of my back OR's"   Hypertension    takes Hyzaar daily   Joint pain    "all over"   Joint swelling    Muscle spasms of lower extremity    takes Baclofen daily prn   Nocturia    OSA on CPAP    Peripheral vascular disease (HCC)    PFO (patent foramen ovale)    positive saline bubble study 05/12/09 ([facility])   Pneumonia 2010/ 2011   PONV (postoperative nausea and vomiting)    woke up during hysterectomy   Shortness of breath    with exertion;Albuterol prn   Urinary frequency     SURGICAL HISTORY: Past Surgical History:  Procedure Laterality Date   ABDOMINAL HYSTERECTOMY  1970's?   APPENDECTOMY     BACK SURGERY     BREAST BIOPSY Right 47's   [place]; 1963; [place]  80's   d/t pneumothorax   COLONOSCOPY     FRACTURE SURGERY     HARDWARE REMOVAL Right 1990's   "took screw out of my lower leg ~ 1 yr after putting it in"   HEMILAMINOTOMY LUMBAR SPINE  02/2002   L5; decompression of the L5 and S1 nerve root; synovial cyst excision/notes 06/09/2010   KNEE  ARTHROSCOPY Right ?2013   LUMBAR LAMINECTOMY/DECOMPRESSION MICRODISCECTOMY  02/2010   POSTERIOR LUMBAR FUSION  05/2003   L4-5 and S-1    TIBIA FRACTURE SURGERY Right 1990's   "put screw in"   TOTAL KNEE ARTHROPLASTY Right 08/28/2012   Procedure: TOTAL KNEE ARTHROPLASTY;  Surgeon: Garald Balding, MD;  Location: [place];  Service: Orthopedics;  Laterality: Right;   TOTAL KNEE ARTHROPLASTY Left 10/07/2014   TOTAL KNEE ARTHROPLASTY Left 10/07/2014   Procedure: TOTAL KNEE ARTHROPLASTY;  Surgeon: Garald Balding, MD;  Location: [place];  Service: Orthopedics;  Laterality: Left;    SOCIAL HISTORY: Social History   Socioeconomic History   Marital status: Divorced    Spouse name: Not on file   Number of children: Not on file    Years of education: Not on file   Highest education level: Not on file  Occupational History   Not on file  Tobacco Use   Smoking status: Never   Smokeless tobacco: Never  Vaping Use   Vaping Use: Never used  Substance and Sexual Activity   Alcohol use: No   Drug use: No   Sexual activity: Not Currently  Other Topics Concern   Not on file  Social History Narrative   Not on file   Social Determinants of Health   Financial Resource Strain: Not on file  Food Insecurity: Not on file  Transportation Needs: Not on file  Physical Activity: Not on file  Stress: Not on file  Social Connections: Not on file  Intimate Partner Violence: Not on file    FAMILY HISTORY: No family history on file.  ALLERGIES:  is allergic to levaquin [levofloxacin], morphine and related, other, oxycodone, and naprosyn [naproxen].  MEDICATIONS:  Current Outpatient Medications  Medication Sig Dispense Refill   acetaminophen (TYLENOL) 650 MG CR tablet Take 1,300 mg by mouth daily.     amLODipine (NORVASC) 5 MG tablet Take 5 mg by mouth daily.     baclofen (LIORESAL) 20 MG tablet Take 10 mg by mouth daily as needed (muscle spasms).     beta carotene w/minerals (OCUVITE) tablet Take 1 tablet by mouth daily.     Calcium Citrate-Vitamin D (CALCIUM CITRATE + D PO) Take 1 tablet by mouth daily.     ferrous sulfate 325 (65 FE) MG tablet Take 325 mg by mouth 2 (two) times daily.     fluticasone (FLONASE) 50 MCG/ACT nasal spray Place 2 sprays into both nostrils daily as needed for allergies or rhinitis.     Krill Oil 300 MG CAPS Take 1 capsule by mouth daily.     spironolactone (ALDACTONE) 25 MG tablet Take 1 tablet by mouth daily.     No current facility-administered medications for this visit.    REVIEW OF SYSTEMS:   Constitutional: ( - ) fevers, ( - )  chills , ( - ) night sweats Eyes: ( - ) blurriness of vision, ( - ) double vision, ( - ) watery eyes Ears, nose, mouth, throat, and face: ( - ) mucositis,  ( - ) sore throat Respiratory: ( - ) cough, ( - ) dyspnea, ( - ) wheezes Cardiovascular: ( - ) palpitation, ( - ) chest discomfort, ( - ) lower extremity swelling Gastrointestinal:  ( - ) nausea, ( - ) heartburn, ( - ) change in bowel habits Skin: ( - ) abnormal skin rashes Lymphatics: ( - ) new lymphadenopathy, ( - ) easy bruising Neurological: ( - ) numbness, ( - ) tingling, ( - )  new weaknesses Behavioral/Psych: ( - ) mood change, ( - ) new changes  All other systems were reviewed with the patient and are negative.  PHYSICAL EXAMINATION:  Vitals:   02/05/21 1033  BP: (!) 161/74  Pulse: 79  Resp: 17  Temp: 98 F (36.7 C)  SpO2: 97%   Filed Weights   02/05/21 1033  Weight: 207 lb (93.9 kg)    GENERAL: Well-appearing elderly African-American female, alert, no distress and comfortable SKIN: skin color, texture, turgor are normal, no rashes or significant lesions EYES: conjunctiva are pink and non-injected, sclera clear LUNGS: clear to auscultation and percussion with normal breathing effort HEART: regular rate & rhythm and no murmurs and no lower extremity edema Musculoskeletal: no cyanosis of digits and no clubbing  PSYCH: alert & oriented x 3, fluent speech NEURO: no focal motor/sensory deficits  LABORATORY DATA:  I have reviewed the data as listed CBC Latest Ref Rng & Units 01/29/2021 10/19/2020 10/01/2020  WBC 4.0 - 10.5 K/uL 7.3 7.2 6.9  Hemoglobin 12.0 - 15.0 g/dL 11.8(L) 11.4(L) 12.1  Hematocrit 36.0 - 46.0 % 34.5(L) 32.9(L) 35.8(L)  Platelets 150 - 400 K/uL 228 228 237    CMP Latest Ref Rng & Units 01/29/2021 10/19/2020 09/21/2020  Glucose 70 - 99 mg/dL 90 88 89  BUN 8 - 23 mg/dL _0 Creatinine 0.44 - 1.00 mg/dL 1.12(H) 1.17(H) 1.31(H)  Sodium 135 - 145 mmol/L 137 140 140  Potassium 3.5 - 5.1 mmol/L 4.4 4.2 4.2  Chloride 98 - 111 mmol/L 104 105 102  CO2 22 - 32 mmol/L _1 Calcium 8.9 - 10.3 mg/dL 9.7 10.0 9.9  Total Protein 6.5 - 8.1 g/dL 8.3(H) 8.2(H)  8.5(H)  Total Bilirubin 0.3 - 1.2 mg/dL 0.6 0.5 0.4  Alkaline Phos 38 - 126 U/L 62 69 78  AST 15 - 41 U/L _2 ALT 0 - 44 U/L _3 Lab Results  Component Value Date   MPROTEIN 1.8 (H) 10/19/2020   MPROTEIN 1.8 (H) 09/21/2020   Lab Results  Component Value Date   KPAFRELGTCHN 69.9 (H) 01/29/2021   KPAFRELGTCHN 70.9 (H) 10/19/2020   KPAFRELGTCHN 77.3 (H) 09/21/2020   LAMBDASER 7.9 01/29/2021   LAMBDASER 7.1 10/19/2020   LAMBDASER 6.6 09/21/2020   KAPLAMBRATIO 8.85 (H) 01/29/2021   KAPLAMBRATIO 9.99 (H) 10/19/2020   KAPLAMBRATIO 11.71 (H) 09/21/2020     RADIOGRAPHIC STUDIES: No results found.  ASSESSMENT & PLAN Diane Daugherty 81 y.o. female with medical history significant for smoldering multiple myeloma who presents for a follow up visit.  Previously we discussed the diagnosis of smoldering multiple myeloma.  Smoldering multiple myeloma is a precursor condition which can lead to multiple myeloma.  The rate of conversion to multiple myeloma is 10 %/year.  Treatment can be considered appropriate and smoldering multiple myeloma cases that meet the 20-2-20 criteria.  This criteria includes 20% plasma cells on bone marrow biopsy, M protein greater than 2, and a serum free light chain ratio of greater than 20.  Currently the patient only meets 1 of these criteria.  As such I do believe that observation is appropriate.  We will plan to check labs on the patient in 3 months time for close monitoring of her blood counts. Clinic visits q 6 months or sooner if needed.   #Smoldering Multiple Myeloma -- The patient currently meets one of the three 20-2-20 criteria (bmbx with 20% plasma cells). Two criteria are  required for treatment.  --labs today show Hgb 11.8, MCV 91.5, Plt 228, Cr 1.12, Ca 9.7 -- No clear indication for treatment as long as the patient does not meet this criteria or any of the CRAB criteria --At each visit we will order SPEP, UPEP, serum free light chains, and  LDH as well as CMP and CBC --Assure imaging of the bones at least once per year.  Last metastatic survey in Sept 2022 --Recommend follow-up visit in 6 months time with interval 3 month labs.   #Knee Pain --per recommendations of bone scan will order MRI knee to further evaluate  #Healthcare Maintenance --patient requesting to establish with new PCP in [place]. Will refer to [facility] primary care.   Orders Placed This Encounter  Procedures   MR KNEE LEFT W WO CONTRAST    Standing Status:   Future    Standing Expiration Date:   02/05/2022    Order Specific Question:   If indicated for the ordered procedure, I authorize the administration of contrast media per Radiology protocol    Answer:   Yes    Order Specific Question:   What is the patient's sedation requirement?    Answer:   No Sedation    Order Specific Question:   Does the patient have a pacemaker or implanted devices?    Answer:   No    Order Specific Question:   Preferred imaging location?    Answer:   [facility] (table limit - 550 lbs)   Ambulatory referral to Internal Medicine    Referral Priority:   Routine    Referral Type:   Consultation    Referral Reason:   Specialty Services Required    Requested Specialty:   Internal Medicine    Number of Visits Requested:   1   All questions were answered. The patient knows to call the clinic with any problems, questions or concerns.  A total of more than 30 minutes were spent on this encounter with face-to-face time and non-face-to-face time, including preparing to see the patient, ordering tests and/or medications, counseling the patient and coordination of care as outlined above.   Ledell Peoples, MD Department of Hematology/Oncology [place] at [facility] Phone: [phone] Pager: [phone] Email: [email or web address]  02/05/2021 11:05 AM

## 2021-02-08 ENCOUNTER — Telehealth: Payer: Self-pay | Admitting: Hematology and Oncology

## 2021-02-08 NOTE — Telephone Encounter (Signed)
Scheduled appt per 1/13 los - patient is aware of appts.

## 2021-02-15 DIAGNOSIS — N1832 Chronic kidney disease, stage 3b: Secondary | ICD-10-CM | POA: Diagnosis not present

## 2021-02-15 DIAGNOSIS — D631 Anemia in chronic kidney disease: Secondary | ICD-10-CM | POA: Diagnosis not present

## 2021-02-15 DIAGNOSIS — I129 Hypertensive chronic kidney disease with stage 1 through stage 4 chronic kidney disease, or unspecified chronic kidney disease: Secondary | ICD-10-CM | POA: Diagnosis not present

## 2021-02-15 DIAGNOSIS — N2581 Secondary hyperparathyroidism of renal origin: Secondary | ICD-10-CM | POA: Diagnosis not present

## 2021-02-16 ENCOUNTER — Other Ambulatory Visit: Payer: Self-pay

## 2021-02-16 ENCOUNTER — Ambulatory Visit (HOSPITAL_COMMUNITY)
Admission: RE | Admit: 2021-02-16 | Discharge: 2021-02-16 | Disposition: A | Discharge status: Home / Self Care | Payer: Medicare Other | Source: Ambulatory Visit | Attending: Hematology and Oncology | Admitting: Hematology and Oncology

## 2021-02-16 DIAGNOSIS — D472 Monoclonal gammopathy: Secondary | ICD-10-CM | POA: Insufficient documentation

## 2021-02-16 MED ORDER — GADOBUTROL 1 MMOL/ML IV SOLN
9.0000 mL | Freq: Once | INTRAVENOUS | Status: AC | PRN
  Administered 2021-02-16: 13:00:00 9 mL via INTRAVENOUS

## 2021-02-17 ENCOUNTER — Telehealth: Payer: Self-pay

## 2021-02-17 NOTE — Telephone Encounter (Signed)
Per Dr. Lorenso Courier, called patient to explain results of MRI of knee.  Dr. Joni Fears performed her knee replacement. She will call his office to schedule appointment. Dr. Durward Fortes is associated with [facility] and should be able top see imaging results. Patient agreed to call clinic if she has any difficulty scheduling appointment.

## 2021-02-22 DIAGNOSIS — R809 Proteinuria, unspecified: Secondary | ICD-10-CM | POA: Diagnosis not present

## 2021-02-22 DIAGNOSIS — E7849 Other hyperlipidemia: Secondary | ICD-10-CM | POA: Diagnosis not present

## 2021-02-22 DIAGNOSIS — I10 Essential (primary) hypertension: Secondary | ICD-10-CM | POA: Diagnosis not present

## 2021-02-22 DIAGNOSIS — Z23 Encounter for immunization: Secondary | ICD-10-CM | POA: Diagnosis not present

## 2021-02-22 DIAGNOSIS — F411 Generalized anxiety disorder: Secondary | ICD-10-CM | POA: Diagnosis not present

## 2021-02-22 DIAGNOSIS — F32A Depression, unspecified: Secondary | ICD-10-CM | POA: Diagnosis not present

## 2021-02-22 DIAGNOSIS — Z0001 Encounter for general adult medical examination with abnormal findings: Secondary | ICD-10-CM | POA: Diagnosis not present

## 2021-03-19 DIAGNOSIS — Z1231 Encounter for screening mammogram for malignant neoplasm of breast: Secondary | ICD-10-CM | POA: Diagnosis not present

## 2021-03-23 ENCOUNTER — Other Ambulatory Visit: Payer: Self-pay

## 2021-03-23 ENCOUNTER — Ambulatory Visit (INDEPENDENT_AMBULATORY_CARE_PROVIDER_SITE_OTHER): Payer: Medicare Other

## 2021-03-23 ENCOUNTER — Encounter: Payer: Self-pay | Admitting: Orthopaedic Surgery

## 2021-03-23 ENCOUNTER — Ambulatory Visit (INDEPENDENT_AMBULATORY_CARE_PROVIDER_SITE_OTHER): Payer: Medicare Other | Admitting: Orthopaedic Surgery

## 2021-03-23 DIAGNOSIS — N183 Chronic kidney disease, stage 3 unspecified: Secondary | ICD-10-CM | POA: Diagnosis not present

## 2021-03-23 DIAGNOSIS — Z96652 Presence of left artificial knee joint: Secondary | ICD-10-CM

## 2021-03-23 DIAGNOSIS — M25562 Pain in left knee: Secondary | ICD-10-CM

## 2021-03-23 DIAGNOSIS — I129 Hypertensive chronic kidney disease with stage 1 through stage 4 chronic kidney disease, or unspecified chronic kidney disease: Secondary | ICD-10-CM | POA: Diagnosis not present

## 2021-03-23 DIAGNOSIS — G8929 Other chronic pain: Secondary | ICD-10-CM

## 2021-03-23 DIAGNOSIS — M1712 Unilateral primary osteoarthritis, left knee: Secondary | ICD-10-CM | POA: Diagnosis not present

## 2021-03-23 NOTE — Progress Notes (Signed)
Office Visit Note   Patient: Diane Daugherty           Date of Birth: [birth year]           MRN: [id number] Visit Date: 03/23/2021              Requested by: Sasser, Silvestre Moment, MD [place],  [place] [address] PCP: Manon Hilding, MD   Assessment & Plan: Visit Diagnoses:  1. Chronic pain of left knee   2. Primary osteoarthritis of left knee   3. History of left knee replacement     Plan: Mrs. Coppock had a left total knee replacement in 2016 and up until about 3 to 4 months ago was doing quite well.  She had insidious onset of discomfort.  She also has been diagnosed with smoldering multiple myeloma.  She had a bone survey demonstrating some cystic area beneath the tibial component of her left total knee replacement and an MRI scan was ordered demonstrating a large cystic area probably related to the prosthesis.  Apparently her multiple myeloma is under control.  X-rays of her knee did not demonstrate any obvious loosening of the tibial component but I think it is worth obtaining a three-phase bone scan.  I will also order a sed rate and C-reactive protein.  Her knee was not warm and it was minimally tender in the proximal tibia and without instability.  At some point we may want to aspirate the knee.  We will have her return shortly after the bone scan.  Fortunately she is not having that much pain.  She does use a cane but relates it is probably because of her "multiple myeloma  Follow-Up Instructions: Return After three-phase bone scan left knee.   Orders:  Orders Placed This Encounter  Procedures   XR KNEE 3 VIEW LEFT   NM Bone Scan 3 Phase   C-reactive protein   Sed Rate (ESR)   No orders of the defined types were placed in this encounter.     Procedures: No procedures performed   Clinical Data: No additional findings.   Subjective: Chief Complaint  Patient presents with   Left Knee - Pain  Patient presents today for left knee pain. She said that it has been hurting  on and off for four months. She said that it does swell medially at times. She has more pain with activity, and feels better with rest. She takes Tylenol as needed. She has a history of total knee arthroplasty with Dr.Majed Pellegrin in 2016. She had an MRI prior to today's appointment. Has a history of "smoldering multiple myeloma.  She had a recent bone survey demonstrating a cystic area beneath the tibial component of her left total knee replacement.  MRI scan was ordered by her oncologist confirming the cystic structure which was felt more likely to be related to the prosthesis rather than the multiple [place].  She is having some discomfort but "not terrible  HPI  Review of Systems   Objective: Vital Signs: There were no vitals taken for this visit.  Physical Exam Constitutional:      Appearance: She is well-developed.  Pulmonary:     Effort: Pulmonary effort is normal.  Skin:    General: Skin is warm and dry.  Neurological:     Mental Status: She is alert and oriented to person, place, and time.  Psychiatric:        Behavior: Behavior normal.    Ortho Exam awake  alert and oriented x3.  Comfortable sitting left knee was not hot red warm and swollen.  No instability.  Full extension about 95 degrees of flexion.  There was some mild discomfort in the proximal tibia but no crepitation.  No popliteal pain.  No calf pain.  Motor exam intact  Specialty Comments:  No specialty comments available.  Imaging: XR KNEE 3 VIEW LEFT  Result Date: 03/23/2021 Films of the left knee were obtained in several projections.  There is a total knee replacement in good alignment.  There is a large cystic area beneath the tibial component with some thinning of the anterior cortex.  No obvious loosening of the tibial component.    PMFS History: Patient Active Problem List   Diagnosis Date Noted   Pain in left knee 03/23/2021   Primary osteoarthritis of left knee 10/07/2014   History of left knee  replacement 10/07/2014   Constipation 10/04/2012   Acute posthemorrhagic anemia 09/26/2012   Asthma, chronic 08/30/2012   Hypertension 08/30/2012   Osteoarthritis of right knee 08/30/2012   Sleep apnea 08/30/2012   Severe obesity (BMI >= 40) ([place]) 08/30/2012   Past Medical History:  Diagnosis Date   Anemia 09/24/1968   "after childbirth"   Arthritis    "feels like all over"   Carpal tunnel syndrome    Carpal tunnel syndrome    left   Cataract    immature on right   Chronic bronchitis ([place])    "get it most years" (10/08/2014)   Chronic kidney disease    stage III, per Pt   Family history of anesthesia complication    daughter gets sick   History of blood transfusion    "w/one of my back OR's"   Hypertension    takes Hyzaar daily   Joint pain    "all over"   Joint swelling    Muscle spasms of lower extremity    takes Baclofen daily prn   Nocturia    OSA on CPAP    Peripheral vascular disease (HCC)    PFO (patent foramen ovale)    positive saline bubble study 05/12/09 ([facility])   Pneumonia 2010/ 2011   PONV (postoperative nausea and vomiting)    woke up during hysterectomy   Shortness of breath    with exertion;Albuterol prn   Urinary frequency     History reviewed. No pertinent family history.  Past Surgical History:  Procedure Laterality Date   ABDOMINAL HYSTERECTOMY  1970's?   APPENDECTOMY     BACK SURGERY     BREAST BIOPSY Right 51's   [place]; 1963; [place]  80's   d/t pneumothorax   COLONOSCOPY     FRACTURE SURGERY     HARDWARE REMOVAL Right 1990's   "took screw out of my lower leg ~ 1 yr after putting it in"   HEMILAMINOTOMY LUMBAR SPINE  02/2002   L5; decompression of the L5 and S1 nerve root; synovial cyst excision/notes 06/09/2010   KNEE ARTHROSCOPY Right ?2013   LUMBAR LAMINECTOMY/DECOMPRESSION MICRODISCECTOMY  02/2010   POSTERIOR LUMBAR FUSION  05/2003   L4-5 and S-1    TIBIA FRACTURE SURGERY Right 1990's    "put screw in"   TOTAL KNEE ARTHROPLASTY Right 08/28/2012   Procedure: TOTAL KNEE ARTHROPLASTY;  Surgeon: Garald Balding, MD;  Location: [place];  Service: Orthopedics;  Laterality: Right;   TOTAL KNEE ARTHROPLASTY Left 10/07/2014   TOTAL KNEE ARTHROPLASTY Left 10/07/2014   Procedure:  TOTAL KNEE ARTHROPLASTY;  Surgeon: Garald Balding, MD;  Location: [place];  Service: Orthopedics;  Laterality: Left;   Social History   Occupational History   Not on file  Tobacco Use   Smoking status: Never   Smokeless tobacco: Never  Vaping Use   Vaping Use: Never used  Substance and Sexual Activity   Alcohol use: No   Drug use: No   Sexual activity: Not Currently

## 2021-03-24 LAB — SEDIMENTATION RATE: Sed Rate: 38 mm/h — ABNORMAL HIGH (ref 0–30)

## 2021-03-24 LAB — C-REACTIVE PROTEIN: CRP: 5.9 mg/L (ref <8.0)

## 2021-04-01 ENCOUNTER — Encounter (HOSPITAL_COMMUNITY)
Admission: RE | Admit: 2021-04-01 | Discharge: 2021-04-01 | Disposition: A | Discharge status: Home / Self Care | Payer: Medicare Other | Source: Ambulatory Visit | Attending: Orthopaedic Surgery | Admitting: Orthopaedic Surgery

## 2021-04-01 ENCOUNTER — Other Ambulatory Visit: Payer: Self-pay

## 2021-04-01 DIAGNOSIS — Z96653 Presence of artificial knee joint, bilateral: Secondary | ICD-10-CM | POA: Diagnosis not present

## 2021-04-01 DIAGNOSIS — G8929 Other chronic pain: Secondary | ICD-10-CM | POA: Diagnosis not present

## 2021-04-01 DIAGNOSIS — Z96642 Presence of left artificial hip joint: Secondary | ICD-10-CM | POA: Diagnosis not present

## 2021-04-01 DIAGNOSIS — M25562 Pain in left knee: Secondary | ICD-10-CM

## 2021-04-01 DIAGNOSIS — M25561 Pain in right knee: Secondary | ICD-10-CM | POA: Diagnosis not present

## 2021-04-01 MED ORDER — TECHNETIUM TC 99M MEDRONATE IV KIT
19.9000 | PACK | Freq: Once | INTRAVENOUS | Status: AC
  Administered 2021-04-01: 10:00:00 19.9 via INTRAVENOUS

## 2021-04-07 ENCOUNTER — Ambulatory Visit (INDEPENDENT_AMBULATORY_CARE_PROVIDER_SITE_OTHER): Payer: Medicare Other

## 2021-04-07 ENCOUNTER — Other Ambulatory Visit: Payer: Self-pay

## 2021-04-07 ENCOUNTER — Ambulatory Visit (INDEPENDENT_AMBULATORY_CARE_PROVIDER_SITE_OTHER): Payer: Medicare Other | Admitting: Orthopaedic Surgery

## 2021-04-07 ENCOUNTER — Encounter: Payer: Self-pay | Admitting: Orthopaedic Surgery

## 2021-04-07 DIAGNOSIS — G8929 Other chronic pain: Secondary | ICD-10-CM

## 2021-04-07 DIAGNOSIS — Z96652 Presence of left artificial knee joint: Secondary | ICD-10-CM

## 2021-04-07 DIAGNOSIS — M5441 Lumbago with sciatica, right side: Secondary | ICD-10-CM | POA: Diagnosis not present

## 2021-04-07 DIAGNOSIS — M545 Low back pain, unspecified: Secondary | ICD-10-CM | POA: Insufficient documentation

## 2021-04-07 DIAGNOSIS — M5442 Lumbago with sciatica, left side: Secondary | ICD-10-CM | POA: Diagnosis not present

## 2021-04-07 NOTE — Progress Notes (Signed)
? ?Office Visit Note ?  ?Patient: Diane Daugherty           ?Date of Birth: [birth year]           ?MRN: [id number] ?Visit Date: 04/07/2021 ?             ?Requested by: Sasser, Silvestre Moment, MD ?[address] ?[place],  [place] [address] ?PCP: Manon Hilding, MD ? ? ?Assessment & Plan: ?Visit Diagnoses:  ?1. Low back pain, unspecified back pain laterality, unspecified chronicity, unspecified whether sciatica present   ?2. History of left knee replacement   ?3. Chronic bilateral low back pain with bilateral sciatica   ? ? ?Plan: Ms. 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  X-rays demonstrate a cyst below the stem of the tibial component which may or may not be related to the prosthesis.  She also has a smaller cyst on the opposite knee in the same location.  She notes that she has had a problem with leg pain in addition to the knee pain and is not sure if it is related to her back or her leg or even her history of multiple myeloma.  She has a follow-up appointment next month with her oncologist.  She does use a cane for ambulation.  I ordered a three-phase bone scan that demonstrated some mild increased uptake along the medial tibial component of both of her knees but a little bit more on the left than the right.  The cystic areas did not light up.  CBC was normal.  C-reactive protein was normal.  The sed rate was slightly elevated at 38.  I did not aspirate her knee.  The issue is whether or not there is a primary issue with her knee replacement.  There is always a possibility of some mild loosening.  She notes that some days she has pain on some days she does not but she also is experiencing thigh and leg pain.  Her past history is significant that she has had prior fusion at L4-5 and L5-S1.  Films of the lumbar spine demonstrate significant degenerative changes above the level of fusion and she might even have stenosis.  I will order an MRI scan of the lumbar spine and  then check her back shortly thereafter.  She may or may not be a candidate for left total knee revision but we will await the results of the MRI scan of the lumbar spine and her oncologist evaluation. ? ?Follow-Up Instructions: Return After MRI scan lumbar spine.  ? ?Orders:  ?Orders Placed This Encounter  ?Procedures  ? XR Lumbar Spine 2-3 Views  ? MR Lumbar Spine w/o contrast  ? ?No orders of the defined types were placed in this encounter. ? ? ? ? Procedures: ?No procedures performed ? ? ?Clinical Data: ?No additional findings. ? ? ?Subjective: ?Chief Complaint  ?Patient presents with  ? Left Knee - Follow-up  ?  Bone Scan Review  ?Patient presents today for follow up on her knees. She had a bone scan and is here today for those results.  No change in her symptoms.  She has had some bilateral knee pain with more on the left than the right.  She is also had some thigh pain and calf discomfort with some back pain.  Her past history is significant that she has had prior fusion from L4 to the sacrum by the neurosurgeons and still has  some back discomfort.  No history of fever or chills ? ?HPI ? ?Review of Systems ? ? ?Objective: ?Vital Signs: There were no vitals taken for this visit. ? ?Physical Exam ?Constitutional:   ?   Appearance: She is well-developed.  ?Pulmonary:  ?   Effort: Pulmonary effort is normal.  ?Skin: ?   General: Skin is warm and dry.  ?Neurological:  ?   Mental Status: She is alert and oriented to person, place, and time.  ?Psychiatric:     ?   Behavior: Behavior normal.  ? ? ?Ortho Exam awake alert and oriented x3.  Comfortable sitting.  Uses a single-point cane to help with her ambulation.  Large knees but I do not think she has an effusion either the right of the left.  There was a little bit of tenderness along the medial aspect of the left knee both along the femur and the tibia the bone scan had some increased activity along the medial tibial plateau but not around the cystic structure noted  by plain film and MRI scan.  Flexed about 95 degrees and no instability.  Painless range of motion of both hips and straight leg raise was negative ? ?Specialty Comments:  ?No specialty comments available. ? ?Imaging: ?XR Lumbar Spine 2-3 Views ? ?Result Date: 04/07/2021 ?Films of the lumbar spine obtained in 2 projections.  There is been a prior posterior fusion of L4-5 and L5-S1.  Hardware is intact.  There are degenerative changes and disc space narrowing at L1-L2 3 and L3-4.  No listhesis.  No evidence of a compression fracture  ? ? ?PMFS History: ?Patient Active Problem List  ? Diagnosis Date Noted  ? Low back pain 04/07/2021  ? Pain in left knee 03/23/2021  ? Primary osteoarthritis of left knee 10/07/2014  ? History of left knee replacement 10/07/2014  ? Constipation 10/04/2012  ? Acute posthemorrhagic anemia 09/26/2012  ? Asthma, chronic 08/30/2012  ? Hypertension 08/30/2012  ? Osteoarthritis of right knee 08/30/2012  ? Sleep apnea 08/30/2012  ? Severe obesity (BMI >= 40) ([place]) 08/30/2012  ? ?Past Medical History:  ?Diagnosis Date  ? Anemia 09/24/1968  ? "after childbirth"  ? Arthritis   ? "feels like all over"  ? Carpal tunnel syndrome   ? Carpal tunnel syndrome   ? left  ? Cataract   ? immature on right  ? Chronic bronchitis ([place])   ? "get it most years" (10/08/2014)  ? Chronic kidney disease   ? stage III, per Pt  ? Family history of anesthesia complication   ? daughter gets sick  ? History of blood transfusion   ? "w/one of my back OR's"  ? Hypertension   ? takes Hyzaar daily  ? Joint pain   ? "all over"  ? Joint swelling   ? Muscle spasms of lower extremity   ? takes Baclofen daily prn  ? Nocturia   ? OSA on CPAP   ? Peripheral vascular disease ([place])   ? PFO (patent foramen ovale)   ? positive saline bubble study 05/12/09 [facility])  ? Pneumonia 2010/ 2011  ? PONV (postoperative nausea and vomiting)   ? woke up during hysterectomy  ? Shortness of breath   ? with exertion;Albuterol prn  ? Urinary frequency    ?  ?History reviewed. No pertinent family history.  ?Past Surgical History:  ?Procedure Laterality Date  ? ABDOMINAL HYSTERECTOMY  1970's?  ? APPENDECTOMY    ? BACK SURGERY    ?  BREAST BIOPSY Right 1990's  ? [place]; 1963; 1970  ? CHEST TUBE INSERTION  80's  ? d/t pneumothorax  ? COLONOSCOPY    ? FRACTURE SURGERY    ? HARDWARE REMOVAL Right 1990's  ? "took screw out of my lower leg ~ 1 yr after putting it in"  ? HEMILAMINOTOMY LUMBAR SPINE  02/2002  ? L5; decompression of the L5 and S1 nerve root; synovial cyst excision/notes 06/09/2010  ? KNEE ARTHROSCOPY Right ?2013  ? LUMBAR LAMINECTOMY/DECOMPRESSION MICRODISCECTOMY  02/2010  ? POSTERIOR LUMBAR FUSION  05/2003  ? L4-5 and S-1   ? TIBIA FRACTURE SURGERY Right 1990's  ? "put screw in"  ? TOTAL KNEE ARTHROPLASTY Right 08/28/2012  ? Procedure: TOTAL KNEE ARTHROPLASTY;  Surgeon: Garald Balding, MD;  Location: [place];  Service: Orthopedics;  Laterality: Right;  ? TOTAL KNEE ARTHROPLASTY Left 10/07/2014  ? TOTAL KNEE ARTHROPLASTY Left 10/07/2014  ? Procedure: TOTAL KNEE ARTHROPLASTY;  Surgeon: Garald Balding, MD;  Location: [place];  Service: Orthopedics;  Laterality: Left;  ? ?Social History  ? ?Occupational History  ? Not on file  ?Tobacco Use  ? Smoking status: Never  ? Smokeless tobacco: Never  ?Vaping Use  ? Vaping Use: Never used  ?Substance and Sexual Activity  ? Alcohol use: No  ? Drug use: No  ? Sexual activity: Not Currently  ? ? ? ? ? ? ?

## 2021-05-05 ENCOUNTER — Other Ambulatory Visit: Payer: Self-pay

## 2021-05-05 DIAGNOSIS — D472 Monoclonal gammopathy: Secondary | ICD-10-CM

## 2021-05-06 ENCOUNTER — Inpatient Hospital Stay: Payer: Medicare Other | Attending: Hematology and Oncology

## 2021-05-06 ENCOUNTER — Other Ambulatory Visit: Payer: Self-pay

## 2021-05-06 DIAGNOSIS — D472 Monoclonal gammopathy: Secondary | ICD-10-CM

## 2021-05-06 DIAGNOSIS — C9000 Multiple myeloma not having achieved remission: Secondary | ICD-10-CM | POA: Diagnosis not present

## 2021-05-06 LAB — CBC WITH DIFFERENTIAL (CANCER CENTER ONLY)
Abs Immature Granulocytes: 0.01 10*3/uL (ref 0.00–0.07)
Basophils Absolute: 0 10*3/uL (ref 0.0–0.1)
Basophils Relative: 1 %
Eosinophils Absolute: 0.2 10*3/uL (ref 0.0–0.5)
Eosinophils Relative: 3 %
HCT: 35.6 % — ABNORMAL LOW (ref 36.0–46.0)
Hemoglobin: 12.1 g/dL (ref 12.0–15.0)
Immature Granulocytes: 0 %
Lymphocytes Relative: 41 %
Lymphs Abs: 2.9 10*3/uL (ref 0.7–4.0)
MCH: 31.3 pg (ref 26.0–34.0)
MCHC: 34 g/dL (ref 30.0–36.0)
MCV: 92.2 fL (ref 80.0–100.0)
Monocytes Absolute: 0.3 10*3/uL (ref 0.1–1.0)
Monocytes Relative: 5 %
Neutro Abs: 3.5 10*3/uL (ref 1.7–7.7)
Neutrophils Relative %: 50 %
Platelet Count: 206 10*3/uL (ref 150–400)
RBC: 3.86 MIL/uL — ABNORMAL LOW (ref 3.87–5.11)
RDW: 14.2 % (ref 11.5–15.5)
WBC Count: 7 10*3/uL (ref 4.0–10.5)
nRBC: 0 % (ref 0.0–0.2)

## 2021-05-06 LAB — CMP (CANCER CENTER ONLY)
ALT: 12 U/L (ref 0–44)
AST: 18 U/L (ref 15–41)
Albumin: 4.2 g/dL (ref 3.5–5.0)
Alkaline Phosphatase: 66 U/L (ref 38–126)
Anion gap: 6 (ref 5–15)
BUN: 16 mg/dL (ref 8–23)
CO2: 31 mmol/L (ref 22–32)
Calcium: 10.2 mg/dL (ref 8.9–10.3)
Chloride: 101 mmol/L (ref 98–111)
Creatinine: 1.22 mg/dL — ABNORMAL HIGH (ref 0.44–1.00)
GFR, Estimated: 45 mL/min — ABNORMAL LOW (ref >60)
Glucose, Bld: 98 mg/dL (ref 70–99)
Potassium: 4.2 mmol/L (ref 3.5–5.1)
Sodium: 138 mmol/L (ref 135–145)
Total Bilirubin: 0.6 mg/dL (ref 0.3–1.2)
Total Protein: 8.6 g/dL — ABNORMAL HIGH (ref 6.5–8.1)

## 2021-05-06 LAB — SAVE SMEAR(SSMR), FOR PROVIDER SLIDE REVIEW

## 2021-05-07 ENCOUNTER — Ambulatory Visit
Admission: RE | Admit: 2021-05-07 | Discharge: 2021-05-07 | Disposition: A | Discharge status: Home / Self Care | Payer: Medicare Other | Source: Ambulatory Visit | Attending: Orthopaedic Surgery | Admitting: Orthopaedic Surgery

## 2021-05-07 DIAGNOSIS — M48061 Spinal stenosis, lumbar region without neurogenic claudication: Secondary | ICD-10-CM | POA: Diagnosis not present

## 2021-05-07 DIAGNOSIS — M4316 Spondylolisthesis, lumbar region: Secondary | ICD-10-CM | POA: Diagnosis not present

## 2021-05-07 DIAGNOSIS — M545 Low back pain, unspecified: Secondary | ICD-10-CM

## 2021-05-07 LAB — KAPPA/LAMBDA LIGHT CHAINS
Kappa free light chain: 77.5 mg/L — ABNORMAL HIGH (ref 3.3–19.4)
Kappa, lambda light chain ratio: 15.5 — ABNORMAL HIGH (ref 0.26–1.65)
Lambda free light chains: 5 mg/L — ABNORMAL LOW (ref 5.7–26.3)

## 2021-05-10 LAB — MULTIPLE MYELOMA PANEL, SERUM
Albumin SerPl Elph-Mcnc: 4.1 g/dL (ref 2.9–4.4)
Albumin/Glob SerPl: 1.1 (ref 0.7–1.7)
Alpha 1: 0.3 g/dL (ref 0.0–0.4)
Alpha2 Glob SerPl Elph-Mcnc: 0.8 g/dL (ref 0.4–1.0)
B-Globulin SerPl Elph-Mcnc: 0.9 g/dL (ref 0.7–1.3)
Gamma Glob SerPl Elph-Mcnc: 2 g/dL — ABNORMAL HIGH (ref 0.4–1.8)
Globulin, Total: 4 g/dL — ABNORMAL HIGH (ref 2.2–3.9)
IgA: 35 mg/dL — ABNORMAL LOW (ref 64–422)
IgG (Immunoglobin G), Serum: 2276 mg/dL — ABNORMAL HIGH (ref 586–1602)
IgM (Immunoglobulin M), Srm: 13 mg/dL — ABNORMAL LOW (ref 26–217)
M Protein SerPl Elph-Mcnc: 1.7 g/dL — ABNORMAL HIGH
Total Protein ELP: 8.1 g/dL (ref 6.0–8.5)

## 2021-05-13 ENCOUNTER — Telehealth: Payer: Self-pay

## 2021-05-13 NOTE — Telephone Encounter (Addendum)
Contacted pt to inform her of Dr. Libby Maw message below. Patient appreciative. ?  ?Pt shared that her orthopaedists believes she needs a left knee replacement. Pt wants to know if Dr. Lorenso Courier thinks that she is stable enough from a cancer standpoint to proceed with operations. ? ?----- Message from Otila Kluver, RN sent at 05/13/2021  8:57 AM EDT ----- ? ?----- Message ----- ?From: Orson Slick, MD ?Sent: 05/11/2021   9:57 AM EDT ?To: Otila Kluver, RN ? ?Please let Ms. Standiford know that her labs are stable.  We will plan to see her back in July 2023 as planned. ? ?----- Message ----- ?From: Interface, Lab In [facility] ?Sent: 05/06/2021  10:18 AM EDT ?To: Orson Slick, MD ? ? ? ?

## 2021-05-13 NOTE — Telephone Encounter (Deleted)
Contacted pt to inform her of Dr. Libby Maw message below. Patient appreciative. ? ?Pt shared that her orthopaedists believes she needs a left knee replacement. Pt wants to know if Dr. Lorenso Courier thinks that she is stable enough from a cancer standpoint to proceed with operations. ?

## 2021-05-23 DIAGNOSIS — R42 Dizziness and giddiness: Secondary | ICD-10-CM | POA: Diagnosis not present

## 2021-05-23 DIAGNOSIS — N183 Chronic kidney disease, stage 3 unspecified: Secondary | ICD-10-CM | POA: Diagnosis not present

## 2021-05-23 DIAGNOSIS — E7849 Other hyperlipidemia: Secondary | ICD-10-CM | POA: Diagnosis not present

## 2021-05-23 DIAGNOSIS — I129 Hypertensive chronic kidney disease with stage 1 through stage 4 chronic kidney disease, or unspecified chronic kidney disease: Secondary | ICD-10-CM | POA: Diagnosis not present

## 2021-05-27 ENCOUNTER — Encounter: Payer: Self-pay | Admitting: Emergency Medicine

## 2021-05-27 ENCOUNTER — Ambulatory Visit (INDEPENDENT_AMBULATORY_CARE_PROVIDER_SITE_OTHER): Payer: Medicare Other | Admitting: Emergency Medicine

## 2021-05-27 VITALS — BP 156/74 | HR 102 | Temp 97.9°F | Ht 61.0 in | Wt 208.5 lb

## 2021-05-27 DIAGNOSIS — Z20822 Contact with and (suspected) exposure to covid-19: Secondary | ICD-10-CM | POA: Diagnosis not present

## 2021-05-27 DIAGNOSIS — Z8579 Personal history of other malignant neoplasms of lymphoid, hematopoietic and related tissues: Secondary | ICD-10-CM | POA: Diagnosis not present

## 2021-05-27 DIAGNOSIS — I10 Essential (primary) hypertension: Secondary | ICD-10-CM

## 2021-05-27 DIAGNOSIS — Z7689 Persons encountering health services in other specified circumstances: Secondary | ICD-10-CM | POA: Diagnosis not present

## 2021-05-27 DIAGNOSIS — N1831 Chronic kidney disease, stage 3a: Secondary | ICD-10-CM

## 2021-05-27 MED ORDER — AMLODIPINE BESYLATE 5 MG PO TABS: 5.0000 mg | ORAL_TABLET | Freq: Every day | ORAL | 3 refills | Status: DC

## 2021-05-27 MED ORDER — FLUTICASONE PROPIONATE 50 MCG/ACT NA SUSP: 2.0000 | Freq: Every day | NASAL | 1 refills | Status: DC | PRN

## 2021-05-27 MED ORDER — VALSARTAN 160 MG PO TABS: 160.0000 mg | ORAL_TABLET | Freq: Every day | ORAL | 3 refills | Status: DC

## 2021-05-27 NOTE — Patient Instructions (Signed)
Hypertension, Adult High blood pressure (hypertension) is when the force of blood pumping through the arteries is too strong. The arteries are the blood vessels that carry blood from the heart throughout the body. Hypertension forces the heart to work harder to pump blood and may cause arteries to become narrow or stiff. Untreated or uncontrolled hypertension can lead to a heart attack, heart failure, a stroke, kidney disease, and other problems. A blood pressure reading consists of a higher number over a lower number. Ideally, your blood pressure should be below 120/80. The first ("top") number is called the systolic pressure. It is a measure of the pressure in your arteries as your heart beats. The second ("bottom") number is called the diastolic pressure. It is a measure of the pressure in your arteries as the heart relaxes. What are the causes? The exact cause of this condition is not known. There are some conditions that result in high blood pressure. What increases the risk? Certain factors may make you more likely to develop high blood pressure. Some of these risk factors are under your control, including: Smoking. Not getting enough exercise or physical activity. Being overweight. Having too much fat, sugar, calories, or salt (sodium) in your diet. Drinking too much alcohol. Other risk factors include: Having a personal history of heart disease, diabetes, high cholesterol, or kidney disease. Stress. Having a family history of high blood pressure and high cholesterol. Having obstructive sleep apnea. Age. The risk increases with age. What are the signs or symptoms? High blood pressure may not cause symptoms. Very high blood pressure (hypertensive crisis) may cause: Headache. Fast or irregular heartbeats (palpitations). Shortness of breath. Nosebleed. Nausea and vomiting. Vision changes. Severe chest pain, dizziness, and seizures. How is this diagnosed? This condition is diagnosed by  measuring your blood pressure while you are seated, with your arm resting on a flat surface, your legs uncrossed, and your feet flat on the floor. The cuff of the blood pressure monitor will be placed directly against the skin of your upper arm at the level of your heart. Blood pressure should be measured at least twice using the same arm. Certain conditions can cause a difference in blood pressure between your right and left arms. If you have a high blood pressure reading during one visit or you have normal blood pressure with other risk factors, you may be asked to: Return on a different day to have your blood pressure checked again. Monitor your blood pressure at home for 1 week or longer. If you are diagnosed with hypertension, you may have other blood or imaging tests to help your health care provider understand your overall risk for other conditions. How is this treated? This condition is treated by making healthy lifestyle changes, such as eating healthy foods, exercising more, and reducing your alcohol intake. You may be referred for counseling on a healthy diet and physical activity. Your health care provider may prescribe medicine if lifestyle changes are not enough to get your blood pressure under control and if: Your systolic blood pressure is above 130. Your diastolic blood pressure is above 80. Your personal target blood pressure may vary depending on your medical conditions, your age, and other factors. Follow these instructions at home: Eating and drinking  Eat a diet that is high in fiber and potassium, and low in sodium, added sugar, and fat. An example of this eating plan is called the DASH diet. DASH stands for Dietary Approaches to Stop Hypertension. To eat this way: Eat   plenty of fresh fruits and vegetables. Try to fill one half of your plate at each meal with fruits and vegetables. Eat whole grains, such as whole-wheat pasta, brown rice, or whole-grain bread. Fill about one  fourth of your plate with whole grains. Eat or drink low-fat dairy products, such as skim milk or low-fat yogurt. Avoid fatty cuts of meat, processed or cured meats, and poultry with skin. Fill about one fourth of your plate with lean proteins, such as fish, chicken without skin, beans, eggs, or tofu. Avoid pre-made and processed foods. These tend to be higher in sodium, added sugar, and fat. Reduce your daily sodium intake. Many people with hypertension should eat less than 1,500 mg of sodium a day. Do not drink alcohol if: Your health care provider tells you not to drink. You are pregnant, may be pregnant, or are planning to become pregnant. If you drink alcohol: Limit how much you have to: 0-1 drink a day for women. 0-2 drinks a day for men. Know how much alcohol is in your drink. In the [place] one drink equals one 12 oz bottle of beer (355 mL), one 5 oz glass of wine (148 mL), or one 1 oz glass of hard liquor (44 mL). Lifestyle  Work with your health care provider to maintain a healthy body weight or to lose weight. Ask what an ideal weight is for you. Get at least 30 minutes of exercise that causes your heart to beat faster (aerobic exercise) most days of the week. Activities may include walking, swimming, or biking. Include exercise to strengthen your muscles (resistance exercise), such as Pilates or lifting weights, as part of your weekly exercise routine. Try to do these types of exercises for 30 minutes at least 3 days a week. Do not use any products that contain nicotine or tobacco. These products include cigarettes, chewing tobacco, and vaping devices, such as e-cigarettes. If you need help quitting, ask your health care provider. Monitor your blood pressure at home as told by your health care provider. Keep all follow-up visits. This is important. Medicines Take over-the-counter and prescription medicines only as told by your health care provider. Follow directions carefully. Blood  pressure medicines must be taken as prescribed. Do not skip doses of blood pressure medicine. Doing this puts you at risk for problems and can make the medicine less effective. Ask your health care provider about side effects or reactions to medicines that you should watch for. Contact a health care provider if you: Think you are having a reaction to a medicine you are taking. Have headaches that keep coming back (recurring). Feel dizzy. Have swelling in your ankles. Have trouble with your vision. Get help right away if you: Develop a severe headache or confusion. Have unusual weakness or numbness. Feel faint. Have severe pain in your chest or abdomen. Vomit repeatedly. Have trouble breathing. These symptoms may be an emergency. Get help right away. Call 911. Do not wait to see if the symptoms will go away. Do not drive yourself to the hospital. Summary Hypertension is when the force of blood pumping through your arteries is too strong. If this condition is not controlled, it may put you at risk for serious complications. Your personal target blood pressure may vary depending on your medical conditions, your age, and other factors. For most people, a normal blood pressure is less than 120/80. Hypertension is treated with lifestyle changes, medicines, or a combination of both. Lifestyle changes include losing weight, eating a healthy,   low-sodium diet, exercising more, and limiting alcohol. This information is not intended to replace advice given to you by your health care provider. Make sure you discuss any questions you have with your health care provider. Document Revised: 11/17/2020 Document Reviewed: 11/17/2020 Elsevier Patient Education  2023 [employer]  

## 2021-05-27 NOTE — Assessment & Plan Note (Signed)
Advised to stay well-hydrated and avoid NSAIDs. ?

## 2021-05-27 NOTE — Progress Notes (Signed)
Diane Daugherty ?81 y.o. ? ? ?Chief Complaint  ?Patient presents with  ? New Patient (Initial Visit)  ? concern about blood pressure   ?  Not happy with the blood pressure medication she is currently taking   ? ? ?HISTORY OF PRESENT ILLNESS: ?This is a 81 y.o. female first visit to this office, here to establish care with me. 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  No treatment yet. ?History of hypertension.  Presently on amlodipine 10 mg daily but blood pressure readings at home still somewhat elevated.  She has also developed edema lower extremities.  Not really taking Aldactone. ?No other complaints or medical concerns today. ? ?HPI ? ? ?Prior to Admission medications   ?Medication Sig Start Date End Date Taking? Authorizing Provider  ?acetaminophen (TYLENOL) 650 MG CR tablet Take 1,300 mg by mouth daily.   Yes [provider]  ?amLODipine (NORVASC) 10 MG tablet Take 10 mg by mouth daily.   Yes [provider]  ?baclofen (LIORESAL) 10 MG tablet Take 10 mg by mouth 3 (three) times daily. 02/11/21  Yes [provider]  ?beta carotene w/minerals (OCUVITE) tablet Take 1 tablet by mouth daily.   Yes [provider]  ?Calcium Citrate-Vitamin D (CALCIUM CITRATE + D PO) Take 1 tablet by mouth daily.   Yes [provider]  ?Astrid Drafts 300 MG CAPS Take 1 capsule by mouth daily.   Yes [provider]  ?spironolactone (ALDACTONE) 25 MG tablet Take 1 tablet by mouth daily. 08/17/17  Yes [provider]  ?amLODipine (NORVASC) 5 MG tablet Take 5 mg by mouth daily. 08/23/20   [provider]  ?fluticasone (FLONASE) 50 MCG/ACT nasal spray Place 2 sprays into both nostrils daily as needed for allergies or rhinitis. 05/27/21   Horald Pollen, MD  ? ? ?Allergies  ?Allergen Reactions  ? Levaquin [Levofloxacin] Swelling  ? Morphine And Related Nausea Only  ?  nausea  ? Other   ?  Darvocet-vomiting  ? Oxycodone Nausea Only  ? Naprosyn  [Naproxen] Itching and Rash  ? ? ?Patient Active Problem List  ? Diagnosis Date Noted  ? Low back pain 04/07/2021  ? Pain in left knee 03/23/2021  ? Primary osteoarthritis of left knee 10/07/2014  ? History of left knee replacement 10/07/2014  ? Constipation 10/04/2012  ? Acute posthemorrhagic anemia 09/26/2012  ? Asthma, chronic 08/30/2012  ? Hypertension 08/30/2012  ? Osteoarthritis of right knee 08/30/2012  ? Sleep apnea 08/30/2012  ? Severe obesity (BMI >= 40) ([place]) 08/30/2012  ? ? ?Past Medical History:  ?Diagnosis Date  ? Anemia 09/24/1968  ? "after childbirth"  ? Arthritis   ? "feels like all over"  ? Carpal tunnel syndrome   ? Carpal tunnel syndrome   ? left  ? Cataract   ? immature on right  ? Chronic bronchitis ([place])   ? "get it most years" (10/08/2014)  ? Chronic kidney disease   ? stage III, per Pt  ? Family history of anesthesia complication   ? daughter gets sick  ? History of blood transfusion   ? "w/one of my back OR's"  ? Hypertension   ? takes Hyzaar daily  ? Joint pain   ? "all over"  ? Joint swelling   ? Muscle spasms of lower extremity   ? takes Baclofen daily prn  ? Nocturia   ? OSA on CPAP   ? Peripheral vascular disease ([place])   ? PFO (patent foramen  ovale)   ? positive saline bubble study 05/12/09 [facility])  ? Pneumonia 2010/ 2011  ? PONV (postoperative nausea and vomiting)   ? woke up during hysterectomy  ? Shortness of breath   ? with exertion;Albuterol prn  ? Urinary frequency   ? ? ?Past Surgical History:  ?Procedure Laterality Date  ? ABDOMINAL HYSTERECTOMY  1970's?  ? APPENDECTOMY    ? BACK SURGERY    ? BREAST BIOPSY Right 1990's  ? [place]; 1963; 1970  ? CHEST TUBE INSERTION  80's  ? d/t pneumothorax  ? COLONOSCOPY    ? FRACTURE SURGERY    ? HARDWARE REMOVAL Right 1990's  ? "took screw out of my lower leg ~ 1 yr after putting it in"  ? HEMILAMINOTOMY LUMBAR SPINE  02/2002  ? L5; decompression of the L5 and S1 nerve root; synovial cyst excision/notes 06/09/2010  ? KNEE  ARTHROSCOPY Right ?2013  ? LUMBAR LAMINECTOMY/DECOMPRESSION MICRODISCECTOMY  02/2010  ? POSTERIOR LUMBAR FUSION  05/2003  ? L4-5 and S-1   ? TIBIA FRACTURE SURGERY Right 1990's  ? "put screw in"  ? TOTAL KNEE ARTHROPLASTY Right 08/28/2012  ? Procedure: TOTAL KNEE ARTHROPLASTY;  Surgeon: Garald Balding, MD;  Location: [place];  Service: Orthopedics;  Laterality: Right;  ? TOTAL KNEE ARTHROPLASTY Left 10/07/2014  ? TOTAL KNEE ARTHROPLASTY Left 10/07/2014  ? Procedure: TOTAL KNEE ARTHROPLASTY;  Surgeon: Garald Balding, MD;  Location: [place] [facility];  Service: Orthopedics;  Laterality: Left;  ? ? ?Social History  ? ?Socioeconomic History  ? Marital status: Divorced  ?  Spouse name: Not on file  ? Number of children: Not on file  ? Years of education: Not on file  ? Highest education level: Not on file  ?Occupational History  ? Not on file  ?Tobacco Use  ? Smoking status: Never  ? Smokeless tobacco: Never  ?Vaping Use  ? Vaping Use: Never used  ?Substance and Sexual Activity  ? Alcohol use: No  ? Drug use: No  ? Sexual activity: Not Currently  ?Other Topics Concern  ? Not on file  ?Social History Narrative  ? Not on file  ? ?Social Determinants of Health  ? ?Financial Resource Strain: Not on file  ?Food Insecurity: Not on file  ?Transportation Needs: Not on file  ?Physical Activity: Not on file  ?Stress: Not on file  ?Social Connections: Not on file  ?Intimate Partner Violence: Not on file  ? ? ?No family history on file. ? ? ?Review of Systems  ?Constitutional: Negative.  Negative for chills and fever.  ?HENT: Negative.  Negative for congestion and sore throat.   ?Respiratory: Negative.  Negative for cough and shortness of breath.   ?Cardiovascular: Negative.  Negative for chest pain and palpitations.  ?Gastrointestinal:  Negative for abdominal pain, diarrhea, nausea and vomiting.  ?Genitourinary: Negative.   ?Skin: Negative.  Negative for rash.  ?All other systems reviewed and are negative. ? ?Today's Vitals  ? 05/27/21 0945  05/27/21 0956  ?BP: (!) 160/76 (!) 156/74  ?Pulse: (!) 102   ?Temp: 97.9 ?F (36.6 ?C)   ?TempSrc: Oral   ?SpO2: 96%   ?Weight: 208 lb 8 oz (94.6 kg)   ?Height: 5' 1"  (1.549 m)   ? ?Body mass index is 39.4 kg/m?. ? ?Physical Exam ?Vitals reviewed.  ?Constitutional:   ?   Appearance: Normal appearance.  ?HENT:  ?   Head: Normocephalic.  ?   Mouth/Throat:  ?   Mouth: Mucous  membranes are moist.  ?   Pharynx: Oropharynx is clear.  ?Eyes:  ?   Extraocular Movements: Extraocular movements intact.  ?   Conjunctiva/sclera: Conjunctivae normal.  ?   Pupils: Pupils are equal, round, and reactive to light.  ?Cardiovascular:  ?   Rate and Rhythm: Normal rate and regular rhythm.  ?   Pulses: Normal pulses.  ?   Heart sounds: Normal heart sounds.  ?Pulmonary:  ?   Effort: Pulmonary effort is normal.  ?   Breath sounds: Normal breath sounds.  ?Musculoskeletal:  ?   Cervical back: No tenderness.  ?Lymphadenopathy:  ?   Cervical: No cervical adenopathy.  ?Skin: ?   General: Skin is warm and dry.  ?   Capillary Refill: Capillary refill takes less than 2 seconds.  ?Neurological:  ?   General: No focal deficit present.  ?   Mental Status: She is alert and oriented to person, place, and time.  ?Psychiatric:     ?   Mood and Affect: Mood normal.     ?   Behavior: Behavior normal.  ? ? ? ?ASSESSMENT & PLAN: ?A total of 50 minutes was spent with the patient and counseling/coordination of care regarding preparing for this visit, review of available medical records, review of most recent blood work results, establishing care with me, review of multiple chronic medical problems and their management, review of all medications and changes made, education on nutrition, prognosis, documentation and need for follow-up ? ?Problem List Items Addressed This Visit   ? ?  ? Cardiovascular and Mediastinum  ? Essential hypertension - Primary  ?  Elevated blood pressure readings at home and in the office. ?Continue amlodipine at a reduced dose of 5 mg  daily due to leg edema. ?Start valsartan 160 mg daily. ?Stop Aldactone, not taking anyway. ?Monitor blood pressure readings at home daily for the next several weeks and keep a log ?Dietary approaches to stop hyper

## 2021-05-27 NOTE — Assessment & Plan Note (Signed)
Under active surveillance. ?Sees oncologist on a regular basis. ?

## 2021-05-27 NOTE — Assessment & Plan Note (Signed)
Elevated blood pressure readings at home and in the office. ?Continue amlodipine at a reduced dose of 5 mg daily due to leg edema. ?Start valsartan 160 mg daily. ?Stop Aldactone, not taking anyway. ?Monitor blood pressure readings at home daily for the next several weeks and keep a log ?Dietary approaches to stop hypertension discussed ?Follow-up in 3 months, earlier as needed ?

## 2021-06-10 ENCOUNTER — Telehealth: Payer: Self-pay | Admitting: Emergency Medicine

## 2021-06-10 NOTE — Telephone Encounter (Signed)
She needs to continue monitoring blood pressure readings at home, several times a day, for several days before we make any additional changes.  Thanks.

## 2021-06-10 NOTE — Telephone Encounter (Signed)
Okay to do that.  Thanks.

## 2021-06-10 NOTE — Telephone Encounter (Signed)
Pt called in and states she was told to check BP daily. States she was rx new BP medicine.  This morning BP - 151/74. Does not know if medicine takes a while getting into system, if she is doing it right, or if she should have another visit in the office.   Requesting call back from assistant.

## 2021-06-14 ENCOUNTER — Telehealth: Payer: Self-pay

## 2021-06-14 ENCOUNTER — Ambulatory Visit: Payer: Medicare Other

## 2021-06-14 VITALS — BP 137/60 | HR 66

## 2021-06-14 DIAGNOSIS — Z0130 Encounter for examination of blood pressure without abnormal findings: Secondary | ICD-10-CM

## 2021-06-14 DIAGNOSIS — I10 Essential (primary) hypertension: Secondary | ICD-10-CM

## 2021-06-14 NOTE — Telephone Encounter (Signed)
Thank you :)

## 2021-06-15 ENCOUNTER — Telehealth: Payer: Self-pay | Admitting: Orthopaedic Surgery

## 2021-06-15 NOTE — Telephone Encounter (Signed)
[place] for referral related to LBP

## 2021-06-15 NOTE — Telephone Encounter (Signed)
Patient called asked if she can be referred to Dr. Louanne Skye for her left knee? The number to contact patient is [phone]

## 2021-06-16 ENCOUNTER — Other Ambulatory Visit: Payer: Self-pay

## 2021-06-16 ENCOUNTER — Telehealth: Payer: Self-pay | Admitting: Orthopaedic Surgery

## 2021-06-16 DIAGNOSIS — Z96652 Presence of left artificial knee joint: Secondary | ICD-10-CM

## 2021-06-16 NOTE — Telephone Encounter (Signed)
Pt called requesting the referral to see Dr. Ninfa Linden for her left knee. Please call pt about this matter at [phone].

## 2021-06-16 NOTE — Telephone Encounter (Signed)
Spoke with patient. She is wanting to see Nitka for her knee. Dr.Whitfield recommends that she see Dr.Blackman for her knee. She does not want to see him. She will think about it and call us back.

## 2021-06-16 NOTE — Telephone Encounter (Signed)
Spoke with patient. Put in referral for Dr.Blackman to evaluate her left knee.

## 2021-06-18 ENCOUNTER — Other Ambulatory Visit: Payer: Self-pay | Admitting: Emergency Medicine

## 2021-07-05 ENCOUNTER — Ambulatory Visit (INDEPENDENT_AMBULATORY_CARE_PROVIDER_SITE_OTHER): Payer: Medicare Other | Admitting: Orthopaedic Surgery

## 2021-07-05 DIAGNOSIS — T84093D Other mechanical complication of internal left knee prosthesis, subsequent encounter: Secondary | ICD-10-CM | POA: Diagnosis not present

## 2021-07-05 DIAGNOSIS — T84033D Mechanical loosening of internal left knee prosthetic joint, subsequent encounter: Secondary | ICD-10-CM | POA: Diagnosis not present

## 2021-07-05 NOTE — Progress Notes (Signed)
The patient is a 81 year old female sent from Dr. 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  This left knee was replaced in 2016.  She has slowly had worsening pain with that left knee and a recent bone scan showed loosening of the tibial component.  A MRI of the knee also showed cystic changes distal to the tibial component suggesting loosening.  There is been no evidence of infection.  She does ambulate the cane.  At this point it is detrimentally affecting her mobility, her quality of life and her actives daily living and has been getting slowly worse.  I was able to go over the plain films of her left knee with her and the bone scan and MRI findings.  All of these are consistent with aseptic loosening of the tibial component.  I do see a cystic change in the metaphyseal area of the tibia and the radiologist feels this is related to likely osteolysis and not pathologic in terms of other worrisome findings.  She says the knee feels cold on occasion.  On my exam the incision is well-healed.  There is some mild effusion of her left knee.  There is some pain around the tibial component of the knee.  There is good range of motion overall.  There is no redness.  I talked her at length in detail about knee revision surgery and what this is involved.  I described the risk and benefits of the surgery and what to expect from an intraoperative and postoperative standpoint.  I went over her x-rays with her and described the revision surgery and showed her some pictures of knee revision surgery.  All questions and concerns were answered and addressed.  She is interested in having the surgery scheduled in the near future.

## 2021-08-05 ENCOUNTER — Other Ambulatory Visit: Payer: Self-pay | Admitting: Hematology and Oncology

## 2021-08-05 ENCOUNTER — Other Ambulatory Visit: Payer: Self-pay

## 2021-08-05 ENCOUNTER — Inpatient Hospital Stay: Payer: Medicare Other

## 2021-08-05 ENCOUNTER — Inpatient Hospital Stay: Payer: Medicare Other | Attending: Hematology and Oncology | Admitting: Hematology and Oncology

## 2021-08-05 VITALS — BP 162/80 | HR 84 | Temp 97.9°F | Resp 18 | Ht 61.0 in | Wt 209.7 lb

## 2021-08-05 DIAGNOSIS — C9000 Multiple myeloma not having achieved remission: Secondary | ICD-10-CM | POA: Diagnosis not present

## 2021-08-05 DIAGNOSIS — D472 Monoclonal gammopathy: Secondary | ICD-10-CM

## 2021-08-05 DIAGNOSIS — M25569 Pain in unspecified knee: Secondary | ICD-10-CM | POA: Insufficient documentation

## 2021-08-05 LAB — CBC WITH DIFFERENTIAL (CANCER CENTER ONLY)
Abs Immature Granulocytes: 0.01 10*3/uL (ref 0.00–0.07)
Basophils Absolute: 0.1 10*3/uL (ref 0.0–0.1)
Basophils Relative: 1 %
Eosinophils Absolute: 0.2 10*3/uL (ref 0.0–0.5)
Eosinophils Relative: 3 %
HCT: 33.3 % — ABNORMAL LOW (ref 36.0–46.0)
Hemoglobin: 11.7 g/dL — ABNORMAL LOW (ref 12.0–15.0)
Immature Granulocytes: 0 %
Lymphocytes Relative: 38 %
Lymphs Abs: 2.5 10*3/uL (ref 0.7–4.0)
MCH: 32.6 pg (ref 26.0–34.0)
MCHC: 35.1 g/dL (ref 30.0–36.0)
MCV: 92.8 fL (ref 80.0–100.0)
Monocytes Absolute: 0.3 10*3/uL (ref 0.1–1.0)
Monocytes Relative: 5 %
Neutro Abs: 3.5 10*3/uL (ref 1.7–7.7)
Neutrophils Relative %: 53 %
Platelet Count: 168 10*3/uL (ref 150–400)
RBC: 3.59 MIL/uL — ABNORMAL LOW (ref 3.87–5.11)
RDW: 14.3 % (ref 11.5–15.5)
WBC Count: 6.5 10*3/uL (ref 4.0–10.5)
nRBC: 0 % (ref 0.0–0.2)

## 2021-08-05 LAB — CMP (CANCER CENTER ONLY)
ALT: 11 U/L (ref 0–44)
AST: 16 U/L (ref 15–41)
Albumin: 4.1 g/dL (ref 3.5–5.0)
Alkaline Phosphatase: 60 U/L (ref 38–126)
Anion gap: 5 (ref 5–15)
BUN: 14 mg/dL (ref 8–23)
CO2: 31 mmol/L (ref 22–32)
Calcium: 10.1 mg/dL (ref 8.9–10.3)
Chloride: 102 mmol/L (ref 98–111)
Creatinine: 1.21 mg/dL — ABNORMAL HIGH (ref 0.44–1.00)
GFR, Estimated: 46 mL/min — ABNORMAL LOW (ref >60)
Glucose, Bld: 110 mg/dL — ABNORMAL HIGH (ref 70–99)
Potassium: 3.8 mmol/L (ref 3.5–5.1)
Sodium: 138 mmol/L (ref 135–145)
Total Bilirubin: 0.6 mg/dL (ref 0.3–1.2)
Total Protein: 8 g/dL (ref 6.5–8.1)

## 2021-08-05 LAB — LACTATE DEHYDROGENASE: LDH: 137 U/L (ref 98–192)

## 2021-08-06 LAB — KAPPA/LAMBDA LIGHT CHAINS
Kappa free light chain: 78 mg/L — ABNORMAL HIGH (ref 3.3–19.4)
Kappa, lambda light chain ratio: 12.79 — ABNORMAL HIGH (ref 0.26–1.65)
Lambda free light chains: 6.1 mg/L (ref 5.7–26.3)

## 2021-08-06 LAB — BETA 2 MICROGLOBULIN, SERUM: Beta-2 Microglobulin: 3.2 mg/L — ABNORMAL HIGH (ref 0.6–2.4)

## 2021-08-08 NOTE — Progress Notes (Signed)
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 Lorenso Courier. Kappa 77.3, Lambda 6.6, ratio 11.71.  09/25/2020: met survey shows no clear lytic lesions 10/01/2020: BmBx showed mildly hypercellular marrow involved by kappa-restricted plasma cell neoplasm (20%)  Interval History:  Diane Daugherty 81 y.o. female with medical history significant for smoldering multiple myeloma who presents for a follow up visit. The patient's last visit was on 02/05/2021. In the interim since the last visit she has had no major changes in her health.  On exam today Diane Daugherty notes she has "been doing all right" in the interim since her last visit.  She does have some occasional pain on her left side of her neck and reports that she feels an occasional pop.  She notes that on August 25 she has a left knee replacement coming up.  She notes that she feels "hungry all the time".  She notes her energy levels are quite good.  She has been sleeping good and overall she is happy with her current functional status.  She currently denies any fevers, chills, sweats, nausea, vomiting or diarrhea.  A full 10 point ROS is listed below.  MEDICAL HISTORY:  Past Medical History:  Diagnosis Date   Anemia 09/24/1968   "after childbirth"   Arthritis    "feels like all over"   Carpal tunnel syndrome    Carpal tunnel syndrome    left   Cataract    immature on right   Chronic bronchitis ([place])    "get it most years" (10/08/2014)   Chronic kidney disease    stage III, per Pt   Family history of anesthesia complication    daughter gets sick   History of blood transfusion    "w/one of my back OR's"   Hypertension    takes Hyzaar daily   Joint  pain    "all over"   Joint swelling    Muscle spasms of lower extremity    takes Baclofen daily prn   Nocturia    OSA on CPAP    Peripheral vascular disease ([facility])    PFO (patent foramen ovale)    positive saline bubble study 05/12/09 ([place])   Pneumonia 2010/ 2011   PONV (postoperative nausea and vomiting)    woke up during hysterectomy   Shortness of breath    with exertion;Albuterol prn   Urinary frequency     SURGICAL HISTORY: Past Surgical History:  Procedure Laterality Date   ABDOMINAL HYSTERECTOMY  1970's?   APPENDECTOMY     BACK SURGERY     BREAST BIOPSY Right 1's   [place]; 1963; [place]  80's   d/t pneumothorax   COLONOSCOPY     FRACTURE SURGERY     HARDWARE REMOVAL Right 1990's   "took screw out of my lower leg ~ 1 yr after putting it in"   HEMILAMINOTOMY LUMBAR SPINE  02/2002   L5; decompression of the L5 and S1 nerve root; synovial cyst excision/notes 06/09/2010   KNEE ARTHROSCOPY Right ?2013   LUMBAR LAMINECTOMY/DECOMPRESSION MICRODISCECTOMY  02/2010   POSTERIOR LUMBAR FUSION  05/2003   L4-5 and S-1    TIBIA FRACTURE SURGERY Right 1990's   "put screw in"   TOTAL KNEE  ARTHROPLASTY Right 08/28/2012   Procedure: TOTAL KNEE ARTHROPLASTY;  Surgeon: Garald Balding, MD;  Location: [facility];  Service: Orthopedics;  Laterality: Right;   TOTAL KNEE ARTHROPLASTY Left 10/07/2014   TOTAL KNEE ARTHROPLASTY Left 10/07/2014   Procedure: TOTAL KNEE ARTHROPLASTY;  Surgeon: Garald Balding, MD;  Location: [facility];  Service: Orthopedics;  Laterality: Left;    SOCIAL HISTORY: Social History   Socioeconomic History   Marital status: Divorced    Spouse name: Not on file   Number of children: Not on file   Years of education: Not on file   Highest education level: Not on file  Occupational History   Not on file  Tobacco Use   Smoking status: Never   Smokeless tobacco: Never  Vaping Use   Vaping Use: Never used  Substance and Sexual  Activity   Alcohol use: No   Drug use: No   Sexual activity: Not Currently  Other Topics Concern   Not on file  Social History Narrative   Not on file   Social Determinants of Health   Financial Resource Strain: Not on file  Food Insecurity: Not on file  Transportation Needs: Not on file  Physical Activity: Not on file  Stress: Not on file  Social Connections: Not on file  Intimate Partner Violence: Not on file    FAMILY HISTORY: No family history on file.  ALLERGIES:  is allergic to levaquin [levofloxacin], morphine and related, other, oxycodone, and naprosyn [naproxen].  MEDICATIONS:  Current Outpatient Medications  Medication Sig Dispense Refill   CHELATED IRON PO Take 27 mg by mouth daily.     Multiple Vitamins-Minerals (CENTRUM SILVER 50+WOMEN PO) Take by mouth.     acetaminophen (TYLENOL) 650 MG CR tablet Take 1,300 mg by mouth daily.     amLODipine (NORVASC) 5 MG tablet Take 1 tablet (5 mg total) by mouth daily. 90 tablet 3   baclofen (LIORESAL) 10 MG tablet Take 10 mg by mouth 3 (three) times daily.     beta carotene w/minerals (OCUVITE) tablet Take 1 tablet by mouth daily.     Calcium Citrate-Vitamin D (CALCIUM CITRATE + D PO) Take 1 tablet by mouth daily.     fluticasone (FLONASE) 50 MCG/ACT nasal spray Place 2 sprays into both nostrils daily as needed for allergies or rhinitis. 16 g 1   Krill Oil 300 MG CAPS Take 1 capsule by mouth daily.     valsartan (DIOVAN) 160 MG tablet Take 1 tablet (160 mg total) by mouth daily. 90 tablet 3   No current facility-administered medications for this visit.    REVIEW OF SYSTEMS:   Constitutional: ( - ) fevers, ( - )  chills , ( - ) night sweats Eyes: ( - ) blurriness of vision, ( - ) double vision, ( - ) watery eyes Ears, nose, mouth, throat, and face: ( - ) mucositis, ( - ) sore throat Respiratory: ( - ) cough, ( - ) dyspnea, ( - ) wheezes Cardiovascular: ( - ) palpitation, ( - ) chest discomfort, ( - ) lower extremity  swelling Gastrointestinal:  ( - ) nausea, ( - ) heartburn, ( - ) change in bowel habits Skin: ( - ) abnormal skin rashes Lymphatics: ( - ) new lymphadenopathy, ( - ) easy bruising Neurological: ( - ) numbness, ( - ) tingling, ( - ) new weaknesses Behavioral/Psych: ( - ) mood change, ( - ) new changes  All other systems were reviewed with the patient  and are negative.  PHYSICAL EXAMINATION:  Vitals:   08/05/21 1107  BP: (!) 162/80  Pulse: 84  Resp: 18  Temp: 97.9 F (36.6 C)  SpO2: 98%   Filed Weights   08/05/21 1107  Weight: 209 lb 11.2 oz (95.1 kg)    GENERAL: Well-appearing elderly African-American female, alert, no distress and comfortable SKIN: skin color, texture, turgor are normal, no rashes or significant lesions EYES: conjunctiva are pink and non-injected, sclera clear LUNGS: clear to auscultation and percussion with normal breathing effort HEART: regular rate & rhythm and no murmurs and no lower extremity edema Musculoskeletal: no cyanosis of digits and no clubbing  PSYCH: alert & oriented x 3, fluent speech NEURO: no focal motor/sensory deficits  LABORATORY DATA:  I have reviewed the data as listed    Latest Ref Rng & Units 08/05/2021   10:16 AM 05/06/2021   10:01 AM 01/29/2021   10:17 AM  CBC  WBC 4.0 - 10.5 K/uL 6.5  7.0  7.3   Hemoglobin 12.0 - 15.0 g/dL 11.7  12.1  11.8   Hematocrit 36.0 - 46.0 % 33.3  35.6  34.5   Platelets 150 - 400 K/uL 168  206  228        Latest Ref Rng & Units 08/05/2021   10:16 AM 05/06/2021   10:01 AM 01/29/2021   10:17 AM  CMP  Glucose 70 - 99 mg/dL 110  98  90   BUN 8 - 23 mg/dL 14  16  20    Creatinine 0.44 - 1.00 mg/dL 1.21  1.22  1.12   Sodium 135 - 145 mmol/L 138  138  137   Potassium 3.5 - 5.1 mmol/L 3.8  4.2  4.4   Chloride 98 - 111 mmol/L 102  101  104   CO2 22 - 32 mmol/L 31  31  28    Calcium 8.9 - 10.3 mg/dL 10.1  10.2  9.7   Total Protein 6.5 - 8.1 g/dL 8.0  8.6  8.3   Total Bilirubin 0.3 - 1.2 mg/dL 0.6  0.6  0.6    Alkaline Phos 38 - 126 U/L 60  66  62   AST 15 - 41 U/L 16  18  16    ALT 0 - 44 U/L 11  12  12      Lab Results  Component Value Date   MPROTEIN 1.7 (H) 05/06/2021   MPROTEIN 1.7 (H) 01/29/2021   MPROTEIN 1.8 (H) 10/19/2020   Lab Results  Component Value Date   KPAFRELGTCHN 78.0 (H) 08/05/2021   KPAFRELGTCHN 77.5 (H) 05/06/2021   KPAFRELGTCHN 69.9 (H) 01/29/2021   LAMBDASER 6.1 08/05/2021   LAMBDASER 5.0 (L) 05/06/2021   LAMBDASER 7.9 01/29/2021   KAPLAMBRATIO 12.79 (H) 08/05/2021   KAPLAMBRATIO 15.50 (H) 05/06/2021   KAPLAMBRATIO 8.85 (H) 01/29/2021     RADIOGRAPHIC STUDIES: No results found.  ASSESSMENT & PLAN Diane Daugherty 81 y.o. female with medical history significant for smoldering multiple myeloma who presents for a follow up visit.  Previously we discussed the diagnosis of smoldering multiple myeloma.  Smoldering multiple myeloma is a precursor condition which can lead to multiple myeloma.  The rate of conversion to multiple myeloma is 10 %/year.  Treatment can be considered appropriate and smoldering multiple myeloma cases that meet the 20-2-20 criteria.  This criteria includes 20% plasma cells on bone marrow biopsy, M protein greater than 2, and a serum free light chain ratio of greater than 20.  Currently the patient  only meets 1 of these criteria.  As such I do believe that observation is appropriate.  We will plan to check labs on the patient in 3 months time for close monitoring of her blood counts. Clinic visits q 6 months or sooner if needed.   #Smoldering Multiple Myeloma -- The patient currently meets one of the three 20-2-20 criteria (bmbx with 20% plasma cells). Two criteria are required for treatment.  --labs today show Hgb 11.7, MCV 92.8, platelets 168, and white blood cell 6.5.  Creatinine is 1.21 and calcium is 10.1. -- No clear indication for treatment as long as the patient does not meet this criteria or any of the CRAB criteria --At each visit we will  order SPEP, UPEP, serum free light chains, and LDH as well as CMP and CBC --Assure imaging of the bones at least once per year.  Last metastatic survey in Sept 2022. Next due in Sept 2023.  --Recommend follow-up visit in 6 months time with interval 3 month labs.   #Knee Pain --per recommendations of bone scan will order MRI knee to further evaluate  #Healthcare Maintenance --patient requesting to establish with new PCP in [place]. Will refer to [facility] primary care.   No orders of the defined types were placed in this encounter.  All questions were answered. The patient knows to call the clinic with any problems, questions or concerns.  A total of more than 30 minutes were spent on this encounter with face-to-face time and non-face-to-face time, including preparing to see the patient, ordering tests and/or medications, counseling the patient and coordination of care as outlined above.   Ledell Peoples, MD Department of Hematology/Oncology [place] [facility] Phone: [phone] Pager: [phone] Email: [email or web address]  08/08/2021 8:11 PM

## 2021-08-09 ENCOUNTER — Telehealth: Payer: Self-pay | Admitting: *Deleted

## 2021-08-09 DIAGNOSIS — C9000 Multiple myeloma not having achieved remission: Secondary | ICD-10-CM | POA: Diagnosis not present

## 2021-08-09 DIAGNOSIS — M25569 Pain in unspecified knee: Secondary | ICD-10-CM | POA: Diagnosis not present

## 2021-08-09 LAB — MULTIPLE MYELOMA PANEL, SERUM
Albumin SerPl Elph-Mcnc: 3.8 g/dL (ref 2.9–4.4)
Albumin/Glob SerPl: 1.1 (ref 0.7–1.7)
Alpha 1: 0.2 g/dL (ref 0.0–0.4)
Alpha2 Glob SerPl Elph-Mcnc: 0.7 g/dL (ref 0.4–1.0)
B-Globulin SerPl Elph-Mcnc: 0.8 g/dL (ref 0.7–1.3)
Gamma Glob SerPl Elph-Mcnc: 2 g/dL — ABNORMAL HIGH (ref 0.4–1.8)
Globulin, Total: 3.8 g/dL (ref 2.2–3.9)
IgA: 35 mg/dL — ABNORMAL LOW (ref 64–422)
IgG (Immunoglobin G), Serum: 2280 mg/dL — ABNORMAL HIGH (ref 586–1602)
IgM (Immunoglobulin M), Srm: 12 mg/dL — ABNORMAL LOW (ref 26–217)
M Protein SerPl Elph-Mcnc: 1.3 g/dL — ABNORMAL HIGH
Total Protein ELP: 7.6 g/dL (ref 6.0–8.5)

## 2021-08-09 NOTE — Telephone Encounter (Signed)
Received vm message from patient asking about her lab results from 08/05/21. Please advise  (multiple myeloma panel is not back yet)

## 2021-08-10 ENCOUNTER — Telehealth: Payer: Self-pay

## 2021-08-10 NOTE — Telephone Encounter (Signed)
Contacted pt per Dr Lorenso Courier to: let pt know that her smoldering multiple myeloma labs are stable. There is no need for treatment at this time. We will plan to have her back for labs in Oct and a clinic visit in Jan 2024. Pt acknowledged information and verbalized understanding.

## 2021-08-12 LAB — UPEP/UIFE/LIGHT CHAINS/TP, 24-HR UR
% BETA, Urine: 20.7 %
ALPHA 1 URINE: 4.1 %
Albumin, U: 47.1 %
Alpha 2, Urine: 6.6 %
Free Kappa Lt Chains,Ur: 5.37 mg/L (ref 1.17–86.46)
Free Kappa/Lambda Ratio: 0.97 — ABNORMAL LOW (ref 1.83–14.26)
Free Lambda Lt Chains,Ur: 5.54 mg/L (ref 0.27–15.21)
GAMMA GLOBULIN URINE: 21.5 %
M-SPIKE %, Urine: 8.4 % — ABNORMAL HIGH
M-Spike, Mg/24 Hr: 12 mg/24 hr — ABNORMAL HIGH
Total Protein, Urine-Ur/day: 146 mg/24 hr (ref 30–150)
Total Protein, Urine: 6.1 mg/dL
Total Volume: 2400

## 2021-08-13 ENCOUNTER — Telehealth: Payer: Self-pay | Admitting: Hematology and Oncology

## 2021-08-13 NOTE — Telephone Encounter (Signed)
.  Called patient to schedule appointment per 7/14 inbasket, patient is aware of date and time.

## 2021-08-16 DIAGNOSIS — N1831 Chronic kidney disease, stage 3a: Secondary | ICD-10-CM | POA: Diagnosis not present

## 2021-08-16 DIAGNOSIS — R799 Abnormal finding of blood chemistry, unspecified: Secondary | ICD-10-CM | POA: Diagnosis not present

## 2021-08-16 DIAGNOSIS — D472 Monoclonal gammopathy: Secondary | ICD-10-CM | POA: Diagnosis not present

## 2021-08-16 DIAGNOSIS — I129 Hypertensive chronic kidney disease with stage 1 through stage 4 chronic kidney disease, or unspecified chronic kidney disease: Secondary | ICD-10-CM | POA: Diagnosis not present

## 2021-08-16 DIAGNOSIS — N1832 Chronic kidney disease, stage 3b: Secondary | ICD-10-CM | POA: Diagnosis not present

## 2021-08-16 DIAGNOSIS — M1712 Unilateral primary osteoarthritis, left knee: Secondary | ICD-10-CM | POA: Diagnosis not present

## 2021-08-16 DIAGNOSIS — R3589 Other polyuria: Secondary | ICD-10-CM | POA: Diagnosis not present

## 2021-08-19 ENCOUNTER — Telehealth: Payer: Self-pay

## 2021-08-19 NOTE — Telephone Encounter (Signed)
Going to postpone surgery for TKA revision so she can get some dental work taken care of.  Said she needs abx called into [facility] [place].

## 2021-08-23 ENCOUNTER — Other Ambulatory Visit: Payer: Self-pay | Admitting: Orthopaedic Surgery

## 2021-08-23 ENCOUNTER — Telehealth: Payer: Self-pay | Admitting: Orthopaedic Surgery

## 2021-08-23 MED ORDER — AMOXICILLIN 500 MG PO TABS: 500.0000 mg | ORAL_TABLET | Freq: Two times a day (BID) | ORAL | 0 refills | Status: DC

## 2021-08-23 NOTE — Telephone Encounter (Signed)
Please advise 

## 2021-08-23 NOTE — Telephone Encounter (Signed)
Patient states she need a Rx for antibiotic. She is schedule for a dental extraction on 08/25/21 @ 4:00 pm with Dr Kermit Balo DDS.  [facility] [place]    Patient's call back # [phone]

## 2021-08-24 ENCOUNTER — Other Ambulatory Visit: Payer: Self-pay

## 2021-08-24 NOTE — Telephone Encounter (Signed)
Called and advised pt.

## 2021-08-26 ENCOUNTER — Encounter: Payer: Self-pay | Admitting: Emergency Medicine

## 2021-08-26 ENCOUNTER — Ambulatory Visit (INDEPENDENT_AMBULATORY_CARE_PROVIDER_SITE_OTHER): Payer: Medicare Other | Admitting: Emergency Medicine

## 2021-08-26 VITALS — BP 130/70 | HR 104 | Temp 98.4°F | Ht 61.0 in | Wt 209.2 lb

## 2021-08-26 DIAGNOSIS — N1831 Chronic kidney disease, stage 3a: Secondary | ICD-10-CM | POA: Diagnosis not present

## 2021-08-26 DIAGNOSIS — I10 Essential (primary) hypertension: Secondary | ICD-10-CM

## 2021-08-26 DIAGNOSIS — D472 Monoclonal gammopathy: Secondary | ICD-10-CM | POA: Diagnosis not present

## 2021-08-26 MED ORDER — BACLOFEN 10 MG PO TABS: 10.0000 mg | ORAL_TABLET | Freq: Three times a day (TID) | ORAL | 1 refills | Status: DC

## 2021-08-26 NOTE — Assessment & Plan Note (Signed)
Normal blood pressure readings at home. Continue amlodipine 5 mg daily and valsartan 160 mg daily Monitor blood pressure readings at home daily for the next several weeks and keep a log. Advised to contact the office if numbers persistently abnormal. Dietary approaches to stop hypertension discussed. Cardiovascular risk associated with uncontrolled hypertension discussed. Follow-up in 3 months.

## 2021-08-26 NOTE — Patient Instructions (Signed)
Hypertension, Adult High blood pressure (hypertension) is when the force of blood pumping through the arteries is too strong. The arteries are the blood vessels that carry blood from the heart throughout the body. Hypertension forces the heart to work harder to pump blood and may cause arteries to become narrow or stiff. Untreated or uncontrolled hypertension can lead to a heart attack, heart failure, a stroke, kidney disease, and other problems. A blood pressure reading consists of a higher number over a lower number. Ideally, your blood pressure should be below 120/80. The first ("top") number is called the systolic pressure. It is a measure of the pressure in your arteries as your heart beats. The second ("bottom") number is called the diastolic pressure. It is a measure of the pressure in your arteries as the heart relaxes. What are the causes? The exact cause of this condition is not known. There are some conditions that result in high blood pressure. What increases the risk? Certain factors may make you more likely to develop high blood pressure. Some of these risk factors are under your control, including: Smoking. Not getting enough exercise or physical activity. Being overweight. Having too much fat, sugar, calories, or salt (sodium) in your diet. Drinking too much alcohol. Other risk factors include: Having a personal history of heart disease, diabetes, high cholesterol, or kidney disease. Stress. Having a family history of high blood pressure and high cholesterol. Having obstructive sleep apnea. Age. The risk increases with age. What are the signs or symptoms? High blood pressure may not cause symptoms. Very high blood pressure (hypertensive crisis) may cause: Headache. Fast or irregular heartbeats (palpitations). Shortness of breath. Nosebleed. Nausea and vomiting. Vision changes. Severe chest pain, dizziness, and seizures. How is this diagnosed? This condition is diagnosed by  measuring your blood pressure while you are seated, with your arm resting on a flat surface, your legs uncrossed, and your feet flat on the floor. The cuff of the blood pressure monitor will be placed directly against the skin of your upper arm at the level of your heart. Blood pressure should be measured at least twice using the same arm. Certain conditions can cause a difference in blood pressure between your right and left arms. If you have a high blood pressure reading during one visit or you have normal blood pressure with other risk factors, you may be asked to: Return on a different day to have your blood pressure checked again. Monitor your blood pressure at home for 1 week or longer. If you are diagnosed with hypertension, you may have other blood or imaging tests to help your health care provider understand your overall risk for other conditions. How is this treated? This condition is treated by making healthy lifestyle changes, such as eating healthy foods, exercising more, and reducing your alcohol intake. You may be referred for counseling on a healthy diet and physical activity. Your health care provider may prescribe medicine if lifestyle changes are not enough to get your blood pressure under control and if: Your systolic blood pressure is above 130. Your diastolic blood pressure is above 80. Your personal target blood pressure may vary depending on your medical conditions, your age, and other factors. Follow these instructions at home: Eating and drinking  Eat a diet that is high in fiber and potassium, and low in sodium, added sugar, and fat. An example of this eating plan is called the DASH diet. DASH stands for Dietary Approaches to Stop Hypertension. To eat this way: Eat   plenty of fresh fruits and vegetables. Try to fill one half of your plate at each meal with fruits and vegetables. Eat whole grains, such as whole-wheat pasta, brown rice, or whole-grain bread. Fill about one  fourth of your plate with whole grains. Eat or drink low-fat dairy products, such as skim milk or low-fat yogurt. Avoid fatty cuts of meat, processed or cured meats, and poultry with skin. Fill about one fourth of your plate with lean proteins, such as fish, chicken without skin, beans, eggs, or tofu. Avoid pre-made and processed foods. These tend to be higher in sodium, added sugar, and fat. Reduce your daily sodium intake. Many people with hypertension should eat less than 1,500 mg of sodium a day. Do not drink alcohol if: Your health care provider tells you not to drink. You are pregnant, may be pregnant, or are planning to become pregnant. If you drink alcohol: Limit how much you have to: 0-1 drink a day for women. 0-2 drinks a day for men. Know how much alcohol is in your drink. In the [place] one drink equals one 12 oz bottle of beer (355 mL), one 5 oz glass of wine (148 mL), or one 1 oz glass of hard liquor (44 mL). Lifestyle  Work with your health care provider to maintain a healthy body weight or to lose weight. Ask what an ideal weight is for you. Get at least 30 minutes of exercise that causes your heart to beat faster (aerobic exercise) most days of the week. Activities may include walking, swimming, or biking. Include exercise to strengthen your muscles (resistance exercise), such as Pilates or lifting weights, as part of your weekly exercise routine. Try to do these types of exercises for 30 minutes at least 3 days a week. Do not use any products that contain nicotine or tobacco. These products include cigarettes, chewing tobacco, and vaping devices, such as e-cigarettes. If you need help quitting, ask your health care provider. Monitor your blood pressure at home as told by your health care provider. Keep all follow-up visits. This is important. Medicines Take over-the-counter and prescription medicines only as told by your health care provider. Follow directions carefully. Blood  pressure medicines must be taken as prescribed. Do not skip doses of blood pressure medicine. Doing this puts you at risk for problems and can make the medicine less effective. Ask your health care provider about side effects or reactions to medicines that you should watch for. Contact a health care provider if you: Think you are having a reaction to a medicine you are taking. Have headaches that keep coming back (recurring). Feel dizzy. Have swelling in your ankles. Have trouble with your vision. Get help right away if you: Develop a severe headache or confusion. Have unusual weakness or numbness. Feel faint. Have severe pain in your chest or abdomen. Vomit repeatedly. Have trouble breathing. These symptoms may be an emergency. Get help right away. Call 911. Do not wait to see if the symptoms will go away. Do not drive yourself to the hospital. Summary Hypertension is when the force of blood pumping through your arteries is too strong. If this condition is not controlled, it may put you at risk for serious complications. Your personal target blood pressure may vary depending on your medical conditions, your age, and other factors. For most people, a normal blood pressure is less than 120/80. Hypertension is treated with lifestyle changes, medicines, or a combination of both. Lifestyle changes include losing weight, eating a healthy,   low-sodium diet, exercising more, and limiting alcohol. This information is not intended to replace advice given to you by your health care provider. Make sure you discuss any questions you have with your health care provider. Document Revised: 11/17/2020 Document Reviewed: 11/17/2020 Elsevier Patient Education  2023 [employer]  

## 2021-08-26 NOTE — Progress Notes (Signed)
Diane Daugherty 81 y.o.   Chief Complaint  Patient presents with   Follow-up    54mth f/u appt , multiple concerns     HISTORY OF PRESENT ILLNESS: This is a 81y.o. female with history hypertension here for 340-monthollow-up. Normal blood pressure readings at home.  However oncologist advised her to increase dose of valsartan from 160 to 320 mg daily due to elevated reading in the office.  She just started doing so yesterday. Patient has multiple blood pressure readings since last May.  Daily readings.  All normal. Also has history of smoldering multiple myeloma. Recent oncologist office visit notes reviewed.  Assessment and plan as follows: #Smoldering Multiple Myeloma -- The patient currently meets one of the three 20-2-20 criteria (bmbx with 20% plasma cells). Two criteria are required for treatment.  --labs today show Hgb 11.7, MCV 92.8, platelets 168, and white blood cell 6.5.  Creatinine is 1.21 and calcium is 10.1. -- No clear indication for treatment as long as the patient does not meet this criteria or any of the CRAB criteria --At each visit we will order SPEP, UPEP, serum free light chains, and LDH as well as CMP and CBC --Assure imaging of the bones at least once per year.  Last metastatic survey in Sept 2022. Next due in Sept 2023.  --Recommend follow-up visit in 6 months time with interval 3 month labs.   HPI   Prior to Admission medications   Medication Sig Start Date End Date Taking? Authorizing Provider  acetaminophen (TYLENOL) 650 MG CR tablet Take 1,300 mg by mouth daily.   Yes [provider]  amLODipine (NORVASC) 5 MG tablet Take 1 tablet (5 mg total) by mouth daily. 05/27/21 05/22/22 Yes Clessie Karras, MiInes BloomerMD  amoxicillin (AMOXIL) 500 MG tablet Take 1 tablet (500 mg total) by mouth 2 (two) times daily. Take 2 tablets 30 minutes prior to your procedure and then 2 tablets 6 hours later. 08/23/21  Yes BlMcarthur RossettiMD  baclofen (LIORESAL) 10 MG  tablet Take 10 mg by mouth 3 (three) times daily. 02/11/21  Yes [provider]  Calcium Citrate-Vitamin D (CALCIUM CITRATE + D PO) Take 1 tablet by mouth daily.   Yes [provider]  CHELATED IRON PO Take 27 mg by mouth daily.   Yes [provider]  fluticasone (FLONASE) 50 MCG/ACT nasal spray Place 2 sprays into both nostrils daily as needed for allergies or rhinitis. 05/27/21  Yes Myia Bergh, MiInes BloomerMD  Krill Oil 300 MG CAPS Take 1 capsule by mouth daily.   Yes [provider]  Multiple Vitamins-Minerals (CENTRUM SILVER 50+WOMEN PO) Take by mouth.   Yes [provider]  valsartan (DIOVAN) 160 MG tablet Take 1 tablet (160 mg total) by mouth daily. Patient taking differently: Take 160 mg by mouth 2 (two) times daily. 05/27/21  Yes Sora Olivo, MiInes BloomerMD  beta carotene w/minerals (OCUVITE) tablet Take 1 tablet by mouth daily. Patient not taking: Reported on 08/26/2021    [provider]    Allergies  Allergen Reactions   Levaquin [Levofloxacin] Swelling   Morphine And Related Nausea Only    nausea   Other     Darvocet-vomiting   Oxycodone Nausea Only   Naprosyn [Naproxen] Itching and Rash    Patient Active Problem List   Diagnosis Date Noted   Stage 3a chronic kidney disease ([place]05/04/[place]   History of multiple myeloma 05/27/2021   Low back pain 04/07/2021  Pain in left knee 03/23/2021   Primary osteoarthritis of left knee 10/07/2014   History of left knee replacement 10/07/2014   Constipation 10/04/2012   Acute posthemorrhagic anemia 09/26/2012   Asthma, chronic 08/30/2012   Essential hypertension 08/30/2012   Osteoarthritis of right knee 08/30/2012   Sleep apnea 08/30/2012   Severe obesity (BMI >= 40) ([place]) 08/30/2012    Past Medical History:  Diagnosis Date   Anemia 09/24/1968   "after childbirth"   Arthritis    "feels like all over"   Carpal tunnel syndrome    Carpal tunnel syndrome    left   Cataract     immature on right   Chronic bronchitis ([place])    "get it most years" (10/08/2014)   Chronic kidney disease    stage III, per Pt   Family history of anesthesia complication    daughter gets sick   History of blood transfusion    "w/one of my back OR's"   Hypertension    takes Hyzaar daily   Joint pain    "all over"   Joint swelling    Muscle spasms of lower extremity    takes Baclofen daily prn   Nocturia    OSA on CPAP    Peripheral vascular disease (HCC)    PFO (patent foramen ovale)    positive saline bubble study 05/12/09 ([place])   Pneumonia 2010/ 2011   PONV (postoperative nausea and vomiting)    woke up during hysterectomy   Shortness of breath    with exertion;Albuterol prn   Urinary frequency     Past Surgical History:  Procedure Laterality Date   ABDOMINAL HYSTERECTOMY  1970's?   APPENDECTOMY     BACK SURGERY     BREAST BIOPSY Right 34's   [place]; 1963; [place]  80's   d/t pneumothorax   COLONOSCOPY     FRACTURE SURGERY     HARDWARE REMOVAL Right 1990's   "took screw out of my lower leg ~ 1 yr after putting it in"   HEMILAMINOTOMY LUMBAR SPINE  02/2002   L5; decompression of the L5 and S1 nerve root; synovial cyst excision/notes 06/09/2010   KNEE ARTHROSCOPY Right ?2013   LUMBAR LAMINECTOMY/DECOMPRESSION MICRODISCECTOMY  02/2010   POSTERIOR LUMBAR FUSION  05/2003   L4-5 and S-1    TIBIA FRACTURE SURGERY Right 1990's   "put screw in"   TOTAL KNEE ARTHROPLASTY Right 08/28/2012   Procedure: TOTAL KNEE ARTHROPLASTY;  Surgeon: Garald Balding, MD;  Location: [facility];  Service: Orthopedics;  Laterality: Right;   TOTAL KNEE ARTHROPLASTY Left 10/07/2014   TOTAL KNEE ARTHROPLASTY Left 10/07/2014   Procedure: TOTAL KNEE ARTHROPLASTY;  Surgeon: Garald Balding, MD;  Location: [facility];  Service: Orthopedics;  Laterality: Left;    Social History   Socioeconomic History   Marital status: Divorced    Spouse name: Not on file    Number of children: Not on file   Years of education: Not on file   Highest education level: Not on file  Occupational History   Not on file  Tobacco Use   Smoking status: Never   Smokeless tobacco: Never  Vaping Use   Vaping Use: Never used  Substance and Sexual Activity   Alcohol use: No   Drug use: No   Sexual activity: Not Currently  Other Topics Concern   Not on file  Social History Narrative   Not on file   Social Determinants  of Health   Financial Resource Strain: Not on file  Food Insecurity: Not on file  Transportation Needs: Not on file  Physical Activity: Not on file  Stress: Not on file  Social Connections: Not on file  Intimate Partner Violence: Not on file    No family history on file.   Review of Systems  Constitutional: Negative.  Negative for chills and fever.  HENT: Negative.  Negative for congestion and sore throat.   Respiratory: Negative.  Negative for cough and shortness of breath.   Cardiovascular: Negative.  Negative for chest pain and palpitations.  Gastrointestinal:  Negative for abdominal pain, diarrhea, nausea and vomiting.  Genitourinary: Negative.   Skin: Negative.  Negative for rash.  Neurological: Negative.  Negative for dizziness and headaches.  All other systems reviewed and are negative.   Today's Vitals   08/26/21 1037 08/26/21 1044  BP: (!) 182/88 (!) 160/82  Pulse: (!) 104   Temp: 98.4 F (36.9 C)   TempSrc: Oral   SpO2: 94%   Weight: 209 lb 4 oz (94.9 kg)   Height: _0  (1.549 m)    Body mass index is 39.54 kg/m. Wt Readings from Last 3 Encounters:  08/26/21 209 lb 4 oz (94.9 kg)  08/05/21 209 lb 11.2 oz (95.1 kg)  05/27/21 208 lb 8 oz (94.6 kg)    Physical Exam Vitals reviewed.  Constitutional:      Appearance: Normal appearance.  HENT:     Head: Normocephalic.     Mouth/Throat:     Mouth: Mucous membranes are moist.     Pharynx: Oropharynx is clear.  Eyes:     Extraocular Movements: Extraocular  movements intact.     Conjunctiva/sclera: Conjunctivae normal.     Pupils: Pupils are equal, round, and reactive to light.  Cardiovascular:     Rate and Rhythm: Normal rate and regular rhythm.     Pulses: Normal pulses.     Heart sounds: Normal heart sounds.  Pulmonary:     Effort: Pulmonary effort is normal.     Breath sounds: Normal breath sounds.  Musculoskeletal:     Cervical back: No tenderness.  Lymphadenopathy:     Cervical: No cervical adenopathy.  Skin:    General: Skin is warm and dry.     Capillary Refill: Capillary refill takes less than 2 seconds.  Neurological:     General: No focal deficit present.     Mental Status: She is alert and oriented to person, place, and time.  Psychiatric:        Mood and Affect: Mood normal.        Behavior: Behavior normal.      ASSESSMENT & PLAN: A total of 45 minutes was spent with the patient and counseling/coordination of care regarding preparing for this visit, review of most recent office visit notes, review of multiple chronic medical problems and their management, review of all medications, cardiovascular risks associated with uncontrolled hypertension, education on nutrition, review of most recent blood work results, prognosis, documentation and need for follow-up.  Problem List Items Addressed This Visit       Cardiovascular and Mediastinum   Essential hypertension - Primary    Normal blood pressure readings at home. Continue amlodipine 5 mg daily and valsartan 160 mg daily Monitor blood pressure readings at home daily for the next several weeks and keep a log. Advised to contact the office if numbers persistently abnormal. Dietary approaches to stop hypertension discussed. Cardiovascular risk associated with uncontrolled  hypertension discussed. Follow-up in 3 months.        Genitourinary   Stage 3a chronic kidney disease ([place])    Advised to stay well-hydrated and avoid NSAIDs.        Other   Smoldering  multiple myeloma    Stable and under surveillance. Does not meet criteria for treatment yet. Oncologist's office visit notes reviewed with patient.      Patient Instructions  Hypertension, Adult High blood pressure (hypertension) is when the force of blood pumping through the arteries is too strong. The arteries are the blood vessels that carry blood from the heart throughout the body. Hypertension forces the heart to work harder to pump blood and may cause arteries to become narrow or stiff. Untreated or uncontrolled hypertension can lead to a heart attack, heart failure, a stroke, kidney disease, and other problems. A blood pressure reading consists of a higher number over a lower number. Ideally, your blood pressure should be below 120/80. The first ("top") number is called the systolic pressure. It is a measure of the pressure in your arteries as your heart beats. The second ("bottom") number is called the diastolic pressure. It is a measure of the pressure in your arteries as the heart relaxes. What are the causes? The exact cause of this condition is not known. There are some conditions that result in high blood pressure. What increases the risk? Certain factors may make you more likely to develop high blood pressure. Some of these risk factors are under your control, including: Smoking. Not getting enough exercise or physical activity. Being overweight. Having too much fat, sugar, calories, or salt (sodium) in your diet. Drinking too much alcohol. Other risk factors include: Having a personal history of heart disease, diabetes, high cholesterol, or kidney disease. Stress. Having a family history of high blood pressure and high cholesterol. Having obstructive sleep apnea. Age. The risk increases with age. What are the signs or symptoms? High blood pressure may not cause symptoms. Very high blood pressure (hypertensive crisis) may cause: Headache. Fast or irregular heartbeats  (palpitations). Shortness of breath. Nosebleed. Nausea and vomiting. Vision changes. Severe chest pain, dizziness, and seizures. How is this diagnosed? This condition is diagnosed by measuring your blood pressure while you are seated, with your arm resting on a flat surface, your legs uncrossed, and your feet flat on the floor. The cuff of the blood pressure monitor will be placed directly against the skin of your upper arm at the level of your heart. Blood pressure should be measured at least twice using the same arm. Certain conditions can cause a difference in blood pressure between your right and left arms. If you have a high blood pressure reading during one visit or you have normal blood pressure with other risk factors, you may be asked to: Return on a different day to have your blood pressure checked again. Monitor your blood pressure at home for 1 week or longer. If you are diagnosed with hypertension, you may have other blood or imaging tests to help your health care provider understand your overall risk for other conditions. How is this treated? This condition is treated by making healthy lifestyle changes, such as eating healthy foods, exercising more, and reducing your alcohol intake. You may be referred for counseling on a healthy diet and physical activity. Your health care provider may prescribe medicine if lifestyle changes are not enough to get your blood pressure under control and if: Your systolic blood pressure is above 130.  Your diastolic blood pressure is above 80. Your personal target blood pressure may vary depending on your medical conditions, your age, and other factors. Follow these instructions at home: Eating and drinking  Eat a diet that is high in fiber and potassium, and low in sodium, added sugar, and fat. An example of this eating plan is called the DASH diet. DASH stands for Dietary Approaches to Stop Hypertension. To eat this way: Eat plenty of fresh fruits  and vegetables. Try to fill one half of your plate at each meal with fruits and vegetables. Eat whole grains, such as whole-wheat pasta, brown rice, or whole-grain bread. Fill about one fourth of your plate with whole grains. Eat or drink low-fat dairy products, such as skim milk or low-fat yogurt. Avoid fatty cuts of meat, processed or cured meats, and poultry with skin. Fill about one fourth of your plate with lean proteins, such as fish, chicken without skin, beans, eggs, or tofu. Avoid pre-made and processed foods. These tend to be higher in sodium, added sugar, and fat. Reduce your daily sodium intake. Many people with hypertension should eat less than 1,500 mg of sodium a day. Do not drink alcohol if: Your health care provider tells you not to drink. You are pregnant, may be pregnant, or are planning to become pregnant. If you drink alcohol: Limit how much you have to: 0-1 drink a day for women. 0-2 drinks a day for men. Know how much alcohol is in your drink. In the [place] one drink equals one 12 oz bottle of beer (355 mL), one 5 oz glass of wine (148 mL), or one 1 oz glass of hard liquor (44 mL). Lifestyle  Work with your health care provider to maintain a healthy body weight or to lose weight. Ask what an ideal weight is for you. Get at least 30 minutes of exercise that causes your heart to beat faster (aerobic exercise) most days of the week. Activities may include walking, swimming, or biking. Include exercise to strengthen your muscles (resistance exercise), such as Pilates or lifting weights, as part of your weekly exercise routine. Try to do these types of exercises for 30 minutes at least 3 days a week. Do not use any products that contain nicotine or tobacco. These products include cigarettes, chewing tobacco, and vaping devices, such as e-cigarettes. If you need help quitting, ask your health care provider. Monitor your blood pressure at home as told by your health care  provider. Keep all follow-up visits. This is important. Medicines Take over-the-counter and prescription medicines only as told by your health care provider. Follow directions carefully. Blood pressure medicines must be taken as prescribed. Do not skip doses of blood pressure medicine. Doing this puts you at risk for problems and can make the medicine less effective. Ask your health care provider about side effects or reactions to medicines that you should watch for. Contact a health care provider if you: Think you are having a reaction to a medicine you are taking. Have headaches that keep coming back (recurring). Feel dizzy. Have swelling in your ankles. Have trouble with your vision. Get help right away if you: Develop a severe headache or confusion. Have unusual weakness or numbness. Feel faint. Have severe pain in your chest or abdomen. Vomit repeatedly. Have trouble breathing. These symptoms may be an emergency. Get help right away. Call 911. Do not wait to see if the symptoms will go away. Do not drive yourself to the hospital. Summary Hypertension is  when the force of blood pumping through your arteries is too strong. If this condition is not controlled, it may put you at risk for serious complications. Your personal target blood pressure may vary depending on your medical conditions, your age, and other factors. For most people, a normal blood pressure is less than 120/80. Hypertension is treated with lifestyle changes, medicines, or a combination of both. Lifestyle changes include losing weight, eating a healthy, low-sodium diet, exercising more, and limiting alcohol. This information is not intended to replace advice given to you by your health care provider. Make sure you discuss any questions you have with your health care provider. Document Revised: 11/17/2020 Document Reviewed: 11/17/2020 Elsevier Patient Education  [place], MD [place]  [facility] at [facility]

## 2021-08-26 NOTE — Assessment & Plan Note (Signed)
Stable and under surveillance. Does not meet criteria for treatment yet. Oncologist's office visit notes reviewed with patient.

## 2021-08-26 NOTE — Assessment & Plan Note (Signed)
Advised to stay well-hydrated and avoid NSAIDs. ?

## 2021-09-06 ENCOUNTER — Encounter (HOSPITAL_COMMUNITY): Payer: Medicare Other

## 2021-09-17 ENCOUNTER — Inpatient Hospital Stay: Admit: 2021-09-17 | Payer: Medicare Other | Admitting: Orthopaedic Surgery

## 2021-09-17 SURGERY — TOTAL KNEE REVISION
Anesthesia: Choice | Site: Knee | Laterality: Left

## 2021-09-24 ENCOUNTER — Other Ambulatory Visit (HOSPITAL_COMMUNITY): Payer: Self-pay

## 2021-09-28 NOTE — Progress Notes (Signed)
Pt. Needs orders for surgery. 

## 2021-09-28 NOTE — Patient Instructions (Signed)
DUE TO COVID-19 ONLY TWO VISITORS  (aged 79 and older)  ARE ALLOWED TO COME WITH YOU AND STAY IN THE WAITING ROOM ONLY DURING PRE OP AND PROCEDURE.   **NO VISITORS ARE ALLOWED IN THE SHORT STAY AREA OR RECOVERY ROOM!!**  IF YOU WILL BE ADMITTED INTO THE HOSPITAL YOU ARE ALLOWED ONLY FOUR SUPPORT PEOPLE DURING VISITATION HOURS ONLY (7 AM -8PM)   The support person(s) must pass our screening, gel in and out, and wear a mask at all times, including in the patient's room. Patients must also wear a mask when staff or their support person are in the room. Visitors GUEST BADGE MUST BE WORN VISIBLY  One adult visitor may remain with you overnight and MUST be in the room by 8 P.M.     Your procedure is scheduled on: 10/08/21   Report to [facility] Main Entrance    Report to admitting at : 10:30 AM   Call this number if you have problems the morning of surgery [phone]   Do not eat food :After Midnight.   After Midnight you may have the following liquids until : 10:00 AM DAY OF SURGERY  Water Black Coffee (sugar ok, NO MILK/CREAM OR CREAMERS)  Tea (sugar ok, NO MILK/CREAM OR CREAMERS) regular and decaf                             Plain Jell-O (NO RED)                                           Fruit ices (not with fruit pulp, NO RED)                                     Popsicles (NO RED)                                                                  Juice: apple, WHITE grape, WHITE cranberry Sports drinks like Gatorade (NO RED)   Oral Hygiene is also important to reduce your risk of infection.                                    Remember - BRUSH YOUR TEETH THE MORNING OF SURGERY WITH YOUR REGULAR TOOTHPASTE   Do NOT smoke after Midnight   Take these medicines the morning of surgery with A SIP OF WATER: amlodipine.Use flonase as usual.Tylenol as needed.   DO NOT TAKE ANY ORAL DIABETIC MEDICATIONS DAY OF YOUR SURGERY  Bring CPAP mask and tubing day of surgery.                               You may not have any metal on your body including hair pins, jewelry, and body piercing             Do not wear make-up, lotions, powders, perfumes/cologne, or deodorant  Do not  wear nail polish including gel and S&S, artificial/acrylic nails, or any other type of covering on natural nails including finger and toenails. If you have artificial nails, gel coating, etc. that needs to be removed by a nail salon please have this removed prior to surgery or surgery may need to be canceled/ delayed if the surgeon/ anesthesia feels like they are unable to be safely monitored.   Do not shave  48 hours prior to surgery.    Do not bring valuables to the hospital. [place].   Contacts, dentures or bridgework may not be worn into surgery.   Bring small overnight bag day of surgery.   DO NOT [place]. PHARMACY WILL DISPENSE MEDICATIONS LISTED ON YOUR MEDICATION LIST TO YOU DURING YOUR ADMISSION [place]!    Patients discharged on the day of surgery will not be allowed to drive home.  Someone NEEDS to stay with you for the first 24 hours after anesthesia.   Special Instructions: Bring a copy of your healthcare power of attorney and living will documents         the day of surgery if you haven't scanned them before.              Please read over the following fact sheets you were given: IF YOU HAVE QUESTIONS ABOUT YOUR PRE-OP INSTRUCTIONS PLEASE CALL [phone]     [facility] - Preparing for Surgery Before surgery, you can play an important role.  Because skin is not sterile, your skin needs to be as free of germs as possible.  You can reduce the number of germs on your skin by washing with CHG (chlorahexidine gluconate) soap before surgery.  CHG is an antiseptic cleaner which kills germs and bonds with the skin to continue killing germs even after washing. Please DO NOT use if you have an allergy  to CHG or antibacterial soaps.  If your skin becomes reddened/irritated stop using the CHG and inform your nurse when you arrive at Short Stay. Do not shave (including legs and underarms) for at least 48 hours prior to the first CHG shower.  You may shave your face/neck. Please follow these instructions carefully:  1.  Shower with CHG Soap the night before surgery and the  morning of Surgery.  2.  If you choose to wash your hair, wash your hair first as usual with your  normal  shampoo.  3.  After you shampoo, rinse your hair and body thoroughly to remove the  shampoo.                           4.  Use CHG as you would any other liquid soap.  You can apply chg directly  to the skin and wash                       Gently with a scrungie or clean washcloth.  5.  Apply the CHG Soap to your body ONLY FROM THE NECK DOWN.   Do not use on face/ open                           Wound or open sores. Avoid contact with eyes, ears mouth and genitals (private parts).  Wash face,  Genitals (private parts) with your normal soap.             6.  Wash thoroughly, paying special attention to the area where your surgery  will be performed.  7.  Thoroughly rinse your body with warm water from the neck down.  8.  DO NOT shower/wash with your normal soap after using and rinsing off  the CHG Soap.                9.  Pat yourself dry with a clean towel.            10.  Wear clean pajamas.            11.  Place clean sheets on your bed the night of your first shower and do not  sleep with pets. Day of Surgery : Do not apply any lotions/deodorants the morning of surgery.  Please wear clean clothes to the hospital/surgery center.  FAILURE TO FOLLOW THESE INSTRUCTIONS MAY RESULT IN THE CANCELLATION OF YOUR SURGERY PATIENT SIGNATURE_________________________________  NURSE SIGNATURE__________________________________  ________________________________________________________________________

## 2021-09-29 ENCOUNTER — Other Ambulatory Visit: Payer: Self-pay

## 2021-09-29 ENCOUNTER — Encounter (HOSPITAL_COMMUNITY)
Admission: RE | Admit: 2021-09-29 | Discharge: 2021-09-29 | Disposition: A | Discharge status: Home / Self Care | Payer: Medicare Other | Source: Ambulatory Visit | Attending: Orthopaedic Surgery | Admitting: Orthopaedic Surgery

## 2021-09-29 ENCOUNTER — Encounter (HOSPITAL_COMMUNITY): Payer: Self-pay

## 2021-09-29 VITALS — BP 142/61 | HR 88 | Temp 98.2°F | Ht 61.0 in | Wt 205.0 lb

## 2021-09-29 DIAGNOSIS — Z01818 Encounter for other preprocedural examination: Secondary | ICD-10-CM | POA: Insufficient documentation

## 2021-09-29 DIAGNOSIS — I10 Essential (primary) hypertension: Secondary | ICD-10-CM | POA: Diagnosis not present

## 2021-09-29 LAB — CBC
HCT: 35.3 % — ABNORMAL LOW (ref 36.0–46.0)
Hemoglobin: 11.9 g/dL — ABNORMAL LOW (ref 12.0–15.0)
MCH: 32.4 pg (ref 26.0–34.0)
MCHC: 33.7 g/dL (ref 30.0–36.0)
MCV: 96.2 fL (ref 80.0–100.0)
Platelets: 207 10*3/uL (ref 150–400)
RBC: 3.67 MIL/uL — ABNORMAL LOW (ref 3.87–5.11)
RDW: 14 % (ref 11.5–15.5)
WBC: 6.7 10*3/uL (ref 4.0–10.5)
nRBC: 0 % (ref 0.0–0.2)

## 2021-09-29 LAB — BASIC METABOLIC PANEL
Anion gap: 6 (ref 5–15)
BUN: 22 mg/dL (ref 8–23)
CO2: 27 mmol/L (ref 22–32)
Calcium: 9.9 mg/dL (ref 8.9–10.3)
Chloride: 105 mmol/L (ref 98–111)
Creatinine, Ser: 1.25 mg/dL — ABNORMAL HIGH (ref 0.44–1.00)
GFR, Estimated: 44 mL/min — ABNORMAL LOW (ref >60)
Glucose, Bld: 94 mg/dL (ref 70–99)
Potassium: 4.7 mmol/L (ref 3.5–5.1)
Sodium: 138 mmol/L (ref 135–145)

## 2021-09-29 LAB — SURGICAL PCR SCREEN
MRSA, PCR: NEGATIVE
Staphylococcus aureus: NEGATIVE

## 2021-09-29 NOTE — Progress Notes (Signed)
For Short Stay: [facility] appointment date: Date of COVID positive in last 22 days:  Bowel Prep reminder:   For Anesthesia: PCP - Dr. Agustina Caroli. LOV:: 08/26/21 Cardiologist -   Chest x-ray -  EKG -  Stress Test -  ECHO -  Cardiac Cath -  Pacemaker/ICD device last checked: Pacemaker orders received: Device Rep notified:  Spinal Cord Stimulator:  Sleep Study - Yes CPAP - Yes  Fasting Blood Sugar -  Checks Blood Sugar _____ times a day Date and result of last Hgb A1c-  Blood Thinner Instructions: Aspirin Instructions: Last Dose:  Activity level: Can go up a flight of stairs and activities of daily living without stopping and without chest pain and/or shortness of breath   Able to exercise without chest pain and/or shortness of breath   Unable to go up a flight of stairs without chest pain and/or shortness of breath     Anesthesia review: Hx: HTN,CKD III,OSA(CPAP),PVD.  Patient denies shortness of breath, fever, cough and chest pain at PAT appointment   Patient verbalized understanding of instructions that were given to them at the PAT appointment. Patient was also instructed that they will need to review over the PAT instructions again at home before surgery.

## 2021-09-30 ENCOUNTER — Encounter: Payer: Medicare Other | Admitting: Orthopaedic Surgery

## 2021-10-06 ENCOUNTER — Other Ambulatory Visit: Payer: Self-pay | Admitting: Physician Assistant

## 2021-10-06 DIAGNOSIS — T84093D Other mechanical complication of internal left knee prosthesis, subsequent encounter: Secondary | ICD-10-CM

## 2021-10-07 DIAGNOSIS — T84093A Other mechanical complication of internal left knee prosthesis, initial encounter: Principal | ICD-10-CM

## 2021-10-07 NOTE — H&P (Signed)
TOTAL KNEE REVISION ADMISSION H&P  Patient is being admitted for left revision total knee arthroplasty.  Subjective:  Chief Complaint:left knee pain.  HPI: Diane Daugherty, 81 y.o. female, has a history of pain and functional disability in the left knee(s) due to failed previous arthroplasty and patient has failed non-surgical conservative treatments for greater than 12 weeks to include NSAID's and/or analgesics, use of assistive devices, weight reduction as appropriate, and activity modification. The indications for the revision of the total knee arthroplasty are loosening of one or more components and progressive or substantial perporsthetic bone loss. Onset of symptoms was gradual starting 1 years ago with gradually worsening course since that time.  Prior procedures on the left knee(s) include arthroplasty.  Patient currently rates pain in the left knee(s) at 8 out of 10 with activity. There is worsening of pain with activity and weight bearing, pain that interferes with activities of daily living, and joint swelling.  Patient has evidence of subchondral cysts and prosthetic loosening by imaging studies. This condition presents safety issues increasing the risk of falls.  There is no current active infection.  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   Stage 3a chronic kidney disease ([facility]) 05/27/2021   History of multiple myeloma 05/27/2021   Primary osteoarthritis of left knee 10/07/2014   History of left knee replacement 10/07/2014   Asthma, chronic 08/30/2012   Essential hypertension 08/30/2012   Osteoarthritis of right knee 08/30/2012   Sleep apnea 08/30/2012   Severe obesity (BMI >= 40) ([facility]) 08/30/2012   Past Medical History:  Diagnosis Date   Anemia 09/24/1968   "after childbirth"   Arthritis    "feels like all over"   Carpal tunnel syndrome    Carpal tunnel syndrome     left   Cataract    immature on right   Chronic bronchitis ([facility])    "get it most years" (10/08/2014)   Chronic kidney disease    stage III, per Pt   Family history of anesthesia complication    daughter gets sick   History of blood transfusion    "w/one of my back OR's"   Hypertension    takes Hyzaar daily   Joint pain    "all over"   Joint swelling    Muscle spasms of lower extremity    takes Baclofen daily prn   Nocturia    OSA on CPAP    Peripheral vascular disease (HCC)    PFO (patent foramen ovale)    positive saline bubble study 05/12/09 ([facility])   Pneumonia 2010/ 2011   PONV (postoperative nausea and vomiting)    woke up during hysterectomy   Shortness of breath    with exertion;Albuterol prn   Urinary frequency     Past Surgical History:  Procedure Laterality Date   ABDOMINAL HYSTERECTOMY  1970's?   APPENDECTOMY     BACK SURGERY     BREAST BIOPSY Right 57's   [place]; 1963; [place]  80's   d/t pneumothorax   COLONOSCOPY     FRACTURE SURGERY     HARDWARE REMOVAL Right 1990's   "took screw out of my lower leg ~ 1 yr after putting it in"   HEMILAMINOTOMY LUMBAR SPINE  02/2002   L5; decompression of the L5 and S1 nerve root; synovial cyst excision/notes 06/09/2010   KNEE ARTHROSCOPY Right ?2013  LUMBAR LAMINECTOMY/DECOMPRESSION MICRODISCECTOMY  02/2010   POSTERIOR LUMBAR FUSION  05/2003   L4-5 and S-1    TIBIA FRACTURE SURGERY Right 1990's   "put screw in"   TOTAL KNEE ARTHROPLASTY Right 08/28/2012   Procedure: TOTAL KNEE ARTHROPLASTY;  Surgeon: Garald Balding, MD;  Location: [facility];  Service: Orthopedics;  Laterality: Right;   TOTAL KNEE ARTHROPLASTY Left 10/07/2014   TOTAL KNEE ARTHROPLASTY Left 10/07/2014   Procedure: TOTAL KNEE ARTHROPLASTY;  Surgeon: Garald Balding, MD;  Location: [facility];  Service: Orthopedics;  Laterality: Left;    No current facility-administered medications for this encounter.   Current Outpatient  Medications  Medication Sig Dispense Refill Last Dose   acetaminophen (TYLENOL) 650 MG CR tablet Take 650-1,300 mg by mouth every 8 (eight) hours as needed for pain.      amLODipine (NORVASC) 5 MG tablet Take 1 tablet (5 mg total) by mouth daily. 90 tablet 3    baclofen (LIORESAL) 10 MG tablet Take 1 tablet (10 mg total) by mouth 3 (three) times daily. (Patient taking differently: Take 10 mg by mouth 3 (three) times daily as needed for muscle spasms.) 30 each 1    Calcium Citrate-Vitamin D (CALCIUM CITRATE + D PO) Take 1 tablet by mouth daily.      CHELATED IRON PO Take 27 mg by mouth daily.      fluticasone (FLONASE) 50 MCG/ACT nasal spray Place 2 sprays into both nostrils daily as needed for allergies or rhinitis. 16 g 1    Krill Oil 300 MG CAPS Take 300 mg by mouth daily.      Multiple Vitamins-Minerals (CENTRUM SILVER 50+WOMEN PO) Take 1 tablet by mouth daily.      valsartan (DIOVAN) 160 MG tablet Take 1 tablet (160 mg total) by mouth daily. (Patient taking differently: Take 160 mg by mouth 2 (two) times daily.) 90 tablet 3    amoxicillin (AMOXIL) 500 MG tablet Take 1 tablet (500 mg total) by mouth 2 (two) times daily. Take 2 tablets 30 minutes prior to your procedure and then 2 tablets 6 hours later. (Patient not taking: Reported on 09/23/2021) 4 tablet 0 Not Taking   Allergies  Allergen Reactions   Levaquin [Levofloxacin] Swelling   Morphine And Related Nausea Only   Other Nausea And Vomiting    Darvocet-vomiting   Oxycodone Nausea Only   Naprosyn [Naproxen] Itching and Rash    Social History   Tobacco Use   Smoking status: Never   Smokeless tobacco: Never  Substance Use Topics   Alcohol use: No    No family history on file.    Review of Systems   Objective:  Physical Exam Vitals reviewed.  Constitutional:      Appearance: Normal appearance. She is obese.  HENT:     Head: Normocephalic and atraumatic.  Eyes:     Extraocular Movements: Extraocular movements intact.      Pupils: Pupils are equal, round, and reactive to light.  Cardiovascular:     Rate and Rhythm: Normal rate.  Pulmonary:     Effort: Pulmonary effort is normal.     Breath sounds: Normal breath sounds.  Abdominal:     Palpations: Abdomen is soft.  Musculoskeletal:     Cervical back: Normal range of motion and neck supple.     Left knee: Effusion present. No bony tenderness. Tenderness present over the medial joint line and lateral joint line.  Neurological:     Mental Status: She is alert and oriented  to person, place, and time.  Psychiatric:        Behavior: Behavior normal.     Vital signs in last 24 hours:    Labs:  Estimated body mass index is 38.73 kg/m as calculated from the following:   Height as of 09/29/21: 5' 1"  (1.549 m).   Weight as of 09/29/21: 93 kg.  Imaging Review Plain radiographs demonstrate evidence of loosening of the tibial components. The bone quality appears to be fair for age and reported activity level. There is a large cystic area in the proximal tibia.    Assessment/Plan:   Left knee with failed previous arthroplasty.   The patient history, physical examination, clinical judgment of the provider and imaging studies are consistent with failed previous left total knee arthroplasty. Revision total knee arthroplasty is deemed medically necessary. The treatment options including medical management, injection therapy, arthroscopy and revision arthroplasty were discussed at length. The risks and benefits of revision total knee arthroplasty were presented and reviewed. The risks due to aseptic loosening, infection, stiffness, patella tracking problems, thromboembolic complications and other imponderables were discussed. The patient acknowledged the explanation, agreed to proceed with the plan and consent was signed. Patient is being admitted for inpatient treatment for surgery, pain control, PT, OT, prophylactic antibiotics, VTE prophylaxis, progressive ambulation  and ADL's and discharge planning.The patient is planning to be discharged home with home health services

## 2021-10-08 ENCOUNTER — Inpatient Hospital Stay (HOSPITAL_COMMUNITY): Payer: Medicare Other

## 2021-10-08 ENCOUNTER — Inpatient Hospital Stay (HOSPITAL_COMMUNITY): Payer: Medicare Other | Admitting: Anesthesiology

## 2021-10-08 ENCOUNTER — Other Ambulatory Visit: Payer: Self-pay

## 2021-10-08 ENCOUNTER — Inpatient Hospital Stay (HOSPITAL_COMMUNITY)
Admission: RE | Admit: 2021-10-08 | Discharge: 2021-10-11 | DRG: 467 | Disposition: A | Discharge status: Skilled Nursing Facility | Payer: Medicare Other | Attending: Orthopaedic Surgery | Admitting: Orthopaedic Surgery

## 2021-10-08 ENCOUNTER — Encounter (HOSPITAL_COMMUNITY)
Admission: RE | Disposition: A | Discharge status: Skilled Nursing Facility | Payer: Self-pay | Source: Home / Self Care | Attending: Orthopaedic Surgery

## 2021-10-08 ENCOUNTER — Encounter (HOSPITAL_COMMUNITY): Payer: Self-pay | Admitting: Orthopaedic Surgery

## 2021-10-08 DIAGNOSIS — D62 Acute posthemorrhagic anemia: Secondary | ICD-10-CM | POA: Diagnosis not present

## 2021-10-08 DIAGNOSIS — I129 Hypertensive chronic kidney disease with stage 1 through stage 4 chronic kidney disease, or unspecified chronic kidney disease: Secondary | ICD-10-CM | POA: Diagnosis present

## 2021-10-08 DIAGNOSIS — Z96652 Presence of left artificial knee joint: Secondary | ICD-10-CM | POA: Diagnosis not present

## 2021-10-08 DIAGNOSIS — T84033A Mechanical loosening of internal left knee prosthetic joint, initial encounter: Principal | ICD-10-CM | POA: Diagnosis present

## 2021-10-08 DIAGNOSIS — T84093A Other mechanical complication of internal left knee prosthesis, initial encounter: Principal | ICD-10-CM

## 2021-10-08 DIAGNOSIS — E669 Obesity, unspecified: Secondary | ICD-10-CM | POA: Diagnosis present

## 2021-10-08 DIAGNOSIS — C9000 Multiple myeloma not having achieved remission: Secondary | ICD-10-CM | POA: Diagnosis present

## 2021-10-08 DIAGNOSIS — Z6838 Body mass index (BMI) 38.0-38.9, adult: Secondary | ICD-10-CM | POA: Diagnosis not present

## 2021-10-08 DIAGNOSIS — Z90710 Acquired absence of both cervix and uterus: Secondary | ICD-10-CM | POA: Diagnosis not present

## 2021-10-08 DIAGNOSIS — M25562 Pain in left knee: Secondary | ICD-10-CM | POA: Diagnosis not present

## 2021-10-08 DIAGNOSIS — N1831 Chronic kidney disease, stage 3a: Secondary | ICD-10-CM | POA: Diagnosis present

## 2021-10-08 DIAGNOSIS — Z9989 Dependence on other enabling machines and devices: Secondary | ICD-10-CM | POA: Diagnosis not present

## 2021-10-08 DIAGNOSIS — M7989 Other specified soft tissue disorders: Secondary | ICD-10-CM | POA: Diagnosis not present

## 2021-10-08 DIAGNOSIS — Z96651 Presence of right artificial knee joint: Secondary | ICD-10-CM | POA: Diagnosis present

## 2021-10-08 DIAGNOSIS — Z471 Aftercare following joint replacement surgery: Secondary | ICD-10-CM | POA: Diagnosis not present

## 2021-10-08 DIAGNOSIS — Y792 Prosthetic and other implants, materials and accessory orthopedic devices associated with adverse incidents: Secondary | ICD-10-CM | POA: Diagnosis present

## 2021-10-08 DIAGNOSIS — R41841 Cognitive communication deficit: Secondary | ICD-10-CM | POA: Diagnosis not present

## 2021-10-08 DIAGNOSIS — Z96642 Presence of left artificial hip joint: Secondary | ICD-10-CM | POA: Diagnosis not present

## 2021-10-08 DIAGNOSIS — Z79899 Other long term (current) drug therapy: Secondary | ICD-10-CM | POA: Diagnosis not present

## 2021-10-08 DIAGNOSIS — G4733 Obstructive sleep apnea (adult) (pediatric): Secondary | ICD-10-CM

## 2021-10-08 DIAGNOSIS — J45909 Unspecified asthma, uncomplicated: Secondary | ICD-10-CM | POA: Diagnosis present

## 2021-10-08 DIAGNOSIS — T84013A Broken internal left knee prosthesis, initial encounter: Secondary | ICD-10-CM | POA: Diagnosis not present

## 2021-10-08 DIAGNOSIS — G8918 Other acute postprocedural pain: Secondary | ICD-10-CM | POA: Diagnosis not present

## 2021-10-08 DIAGNOSIS — I739 Peripheral vascular disease, unspecified: Secondary | ICD-10-CM | POA: Diagnosis present

## 2021-10-08 DIAGNOSIS — T84093D Other mechanical complication of internal left knee prosthesis, subsequent encounter: Secondary | ICD-10-CM

## 2021-10-08 DIAGNOSIS — I10 Essential (primary) hypertension: Secondary | ICD-10-CM

## 2021-10-08 DIAGNOSIS — R2681 Unsteadiness on feet: Secondary | ICD-10-CM | POA: Diagnosis not present

## 2021-10-08 DIAGNOSIS — R293 Abnormal posture: Secondary | ICD-10-CM | POA: Diagnosis not present

## 2021-10-08 DIAGNOSIS — M6281 Muscle weakness (generalized): Secondary | ICD-10-CM | POA: Diagnosis not present

## 2021-10-08 DIAGNOSIS — R1311 Dysphagia, oral phase: Secondary | ICD-10-CM | POA: Diagnosis not present

## 2021-10-08 HISTORY — PX: TOTAL KNEE REVISION: SHX996

## 2021-10-08 LAB — TYPE AND SCREEN
ABO/RH(D): A POS
Antibody Screen: NEGATIVE

## 2021-10-08 SURGERY — TOTAL KNEE REVISION
Anesthesia: General | Site: Knee | Laterality: Left

## 2021-10-08 MED ORDER — ORAL CARE MOUTH RINSE: 15.0000 mL | Freq: Once | OROMUCOSAL | Status: AC

## 2021-10-08 MED ORDER — IRBESARTAN 150 MG PO TABS
150.0000 mg | ORAL_TABLET | Freq: Two times a day (BID) | ORAL | Status: DC
  Administered 2021-10-09 – 2021-10-10 (×4): 150 mg via ORAL
  Filled 2021-10-08 (×5): qty 1

## 2021-10-08 MED ORDER — MORPHINE SULFATE (PF) 2 MG/ML IV SOLN
0.5000 mg | INTRAVENOUS | Status: DC | PRN
  Administered 2021-10-09: 1 mg via INTRAVENOUS
  Filled 2021-10-08: qty 1

## 2021-10-08 MED ORDER — EPHEDRINE 5 MG/ML INJ
INTRAVENOUS | Status: AC
  Filled 2021-10-08: qty 5

## 2021-10-08 MED ORDER — METOCLOPRAMIDE HCL 5 MG/ML IJ SOLN: 5.0000 mg | Freq: Three times a day (TID) | INTRAMUSCULAR | Status: DC | PRN

## 2021-10-08 MED ORDER — HYDROMORPHONE HCL 1 MG/ML IJ SOLN
0.2500 mg | INTRAMUSCULAR | Status: DC | PRN
  Administered 2021-10-08: 0.5 mg via INTRAVENOUS
  Administered 2021-10-08: 0.25 mg via INTRAVENOUS

## 2021-10-08 MED ORDER — DOCUSATE SODIUM 100 MG PO CAPS
100.0000 mg | ORAL_CAPSULE | Freq: Two times a day (BID) | ORAL | Status: DC
  Administered 2021-10-08 – 2021-10-10 (×5): 100 mg via ORAL
  Filled 2021-10-08 (×6): qty 1

## 2021-10-08 MED ORDER — CHLORHEXIDINE GLUCONATE 0.12 % MT SOLN
15.0000 mL | Freq: Once | OROMUCOSAL | Status: AC
  Administered 2021-10-08: 15 mL via OROMUCOSAL

## 2021-10-08 MED ORDER — AMLODIPINE BESYLATE 5 MG PO TABS
5.0000 mg | ORAL_TABLET | Freq: Every day | ORAL | Status: DC
  Administered 2021-10-09 – 2021-10-10 (×2): 5 mg via ORAL
  Filled 2021-10-08 (×3): qty 1

## 2021-10-08 MED ORDER — SODIUM CHLORIDE 0.9 % IR SOLN
Status: DC | PRN
  Administered 2021-10-08: 2000 mL

## 2021-10-08 MED ORDER — LIDOCAINE HCL (PF) 2 % IJ SOLN
INTRAMUSCULAR | Status: AC
  Filled 2021-10-08: qty 5

## 2021-10-08 MED ORDER — STERILE WATER FOR IRRIGATION IR SOLN
Status: DC | PRN
  Administered 2021-10-08: 2000 mL

## 2021-10-08 MED ORDER — HYDROCODONE-ACETAMINOPHEN 7.5-325 MG PO TABS
ORAL_TABLET | ORAL | Status: AC
  Administered 2021-10-08: 1 via ORAL
  Filled 2021-10-08: qty 1

## 2021-10-08 MED ORDER — FENTANYL CITRATE PF 50 MCG/ML IJ SOSY
25.0000 ug | PREFILLED_SYRINGE | INTRAMUSCULAR | Status: DC | PRN
  Administered 2021-10-08 (×3): 50 ug via INTRAVENOUS

## 2021-10-08 MED ORDER — PROPOFOL 10 MG/ML IV BOLUS
INTRAVENOUS | Status: DC | PRN
  Administered 2021-10-08: 150 mg via INTRAVENOUS

## 2021-10-08 MED ORDER — PROMETHAZINE HCL 25 MG/ML IJ SOLN: 6.2500 mg | INTRAMUSCULAR | Status: DC | PRN

## 2021-10-08 MED ORDER — PHENOL 1.4 % MT LIQD: 1.0000 | OROMUCOSAL | Status: DC | PRN

## 2021-10-08 MED ORDER — PANTOPRAZOLE SODIUM 40 MG PO TBEC
40.0000 mg | DELAYED_RELEASE_TABLET | Freq: Every day | ORAL | Status: DC
  Administered 2021-10-08 – 2021-10-11 (×4): 40 mg via ORAL
  Filled 2021-10-08 (×4): qty 1

## 2021-10-08 MED ORDER — EPHEDRINE SULFATE-NACL 50-0.9 MG/10ML-% IV SOSY
PREFILLED_SYRINGE | INTRAVENOUS | Status: DC | PRN
  Administered 2021-10-08: 10 mg via INTRAVENOUS

## 2021-10-08 MED ORDER — BUPIVACAINE-EPINEPHRINE (PF) 0.25% -1:200000 IJ SOLN
INTRAMUSCULAR | Status: AC
  Filled 2021-10-08: qty 30

## 2021-10-08 MED ORDER — METOCLOPRAMIDE HCL 5 MG PO TABS: 5.0000 mg | ORAL_TABLET | Freq: Three times a day (TID) | ORAL | Status: DC | PRN

## 2021-10-08 MED ORDER — HYDROMORPHONE HCL 1 MG/ML IJ SOLN
INTRAMUSCULAR | Status: AC
  Filled 2021-10-08: qty 2

## 2021-10-08 MED ORDER — ROPIVACAINE HCL 7.5 MG/ML IJ SOLN
INTRAMUSCULAR | Status: DC | PRN
  Administered 2021-10-08: 20 mL via PERINEURAL

## 2021-10-08 MED ORDER — CEFAZOLIN SODIUM-DEXTROSE 2-4 GM/100ML-% IV SOLN
2.0000 g | INTRAVENOUS | Status: AC
  Administered 2021-10-08: 2 g via INTRAVENOUS
  Filled 2021-10-08: qty 100

## 2021-10-08 MED ORDER — HYDROCODONE-ACETAMINOPHEN 5-325 MG PO TABS: 1.0000 | ORAL_TABLET | ORAL | Status: DC | PRN

## 2021-10-08 MED ORDER — ALUM & MAG HYDROXIDE-SIMETH 200-200-20 MG/5ML PO SUSP: 30.0000 mL | ORAL | Status: DC | PRN

## 2021-10-08 MED ORDER — FENTANYL CITRATE PF 50 MCG/ML IJ SOSY
50.0000 ug | PREFILLED_SYRINGE | INTRAMUSCULAR | Status: DC
  Administered 2021-10-08: 100 ug via INTRAVENOUS
  Filled 2021-10-08: qty 2

## 2021-10-08 MED ORDER — ONDANSETRON HCL 4 MG PO TABS: 4.0000 mg | ORAL_TABLET | Freq: Four times a day (QID) | ORAL | Status: DC | PRN

## 2021-10-08 MED ORDER — SODIUM CHLORIDE 0.9 % IV SOLN: INTRAVENOUS | Status: DC

## 2021-10-08 MED ORDER — TRANEXAMIC ACID-NACL 1000-0.7 MG/100ML-% IV SOLN
1000.0000 mg | INTRAVENOUS | Status: AC
  Administered 2021-10-08: 1000 mg via INTRAVENOUS
  Filled 2021-10-08: qty 100

## 2021-10-08 MED ORDER — 0.9 % SODIUM CHLORIDE (POUR BTL) OPTIME
TOPICAL | Status: DC | PRN
  Administered 2021-10-08: 1000 mL

## 2021-10-08 MED ORDER — MENTHOL 3 MG MT LOZG: 1.0000 | LOZENGE | OROMUCOSAL | Status: DC | PRN

## 2021-10-08 MED ORDER — FENTANYL CITRATE PF 50 MCG/ML IJ SOSY
PREFILLED_SYRINGE | INTRAMUSCULAR | Status: AC
  Filled 2021-10-08: qty 3

## 2021-10-08 MED ORDER — FENTANYL CITRATE (PF) 100 MCG/2ML IJ SOLN
INTRAMUSCULAR | Status: DC | PRN
  Administered 2021-10-08: 100 ug via INTRAVENOUS

## 2021-10-08 MED ORDER — ACETAMINOPHEN 325 MG PO TABS
325.0000 mg | ORAL_TABLET | Freq: Four times a day (QID) | ORAL | Status: DC | PRN
  Administered 2021-10-11: 650 mg via ORAL
  Filled 2021-10-08 (×2): qty 2

## 2021-10-08 MED ORDER — LIDOCAINE 2% (20 MG/ML) 5 ML SYRINGE
INTRAMUSCULAR | Status: DC | PRN
  Administered 2021-10-08: 80 mg via INTRAVENOUS

## 2021-10-08 MED ORDER — DEXAMETHASONE SODIUM PHOSPHATE 10 MG/ML IJ SOLN
INTRAMUSCULAR | Status: DC | PRN
  Administered 2021-10-08: 5 mg

## 2021-10-08 MED ORDER — BACLOFEN 10 MG PO TABS
10.0000 mg | ORAL_TABLET | Freq: Three times a day (TID) | ORAL | Status: DC | PRN
  Filled 2021-10-08: qty 1

## 2021-10-08 MED ORDER — POVIDONE-IODINE 10 % EX SWAB
2.0000 | Freq: Once | CUTANEOUS | Status: AC
  Administered 2021-10-08: 2 via TOPICAL

## 2021-10-08 MED ORDER — LACTATED RINGERS IV SOLN: INTRAVENOUS | Status: DC

## 2021-10-08 MED ORDER — APIXABAN 2.5 MG PO TABS
2.5000 mg | ORAL_TABLET | Freq: Two times a day (BID) | ORAL | Status: DC
  Administered 2021-10-09 – 2021-10-11 (×5): 2.5 mg via ORAL
  Filled 2021-10-08 (×5): qty 1

## 2021-10-08 MED ORDER — HYDROCODONE-ACETAMINOPHEN 7.5-325 MG PO TABS
1.0000 | ORAL_TABLET | ORAL | Status: DC | PRN
  Administered 2021-10-08: 1 via ORAL
  Administered 2021-10-09 (×2): 2 via ORAL
  Administered 2021-10-09 (×2): 1 via ORAL
  Filled 2021-10-08: qty 2
  Filled 2021-10-08 (×2): qty 1
  Filled 2021-10-08: qty 2
  Filled 2021-10-08 (×2): qty 1

## 2021-10-08 MED ORDER — FENTANYL CITRATE (PF) 100 MCG/2ML IJ SOLN
INTRAMUSCULAR | Status: AC
  Filled 2021-10-08: qty 2

## 2021-10-08 MED ORDER — AMISULPRIDE (ANTIEMETIC) 5 MG/2ML IV SOLN: 10.0000 mg | Freq: Once | INTRAVENOUS | Status: DC | PRN

## 2021-10-08 MED ORDER — HYDROCODONE-ACETAMINOPHEN 7.5-325 MG PO TABS
ORAL_TABLET | ORAL | Status: AC
  Filled 2021-10-08: qty 1

## 2021-10-08 MED ORDER — CEFAZOLIN SODIUM-DEXTROSE 1-4 GM/50ML-% IV SOLN
1.0000 g | Freq: Four times a day (QID) | INTRAVENOUS | Status: AC
  Administered 2021-10-08 – 2021-10-09 (×2): 1 g via INTRAVENOUS
  Filled 2021-10-08 (×2): qty 50

## 2021-10-08 MED ORDER — ONDANSETRON HCL 4 MG/2ML IJ SOLN: 4.0000 mg | Freq: Four times a day (QID) | INTRAMUSCULAR | Status: DC | PRN

## 2021-10-08 SURGICAL SUPPLY — 67 items
APL SKNCLS STERI-STRIP NONHPOA (GAUZE/BANDAGES/DRESSINGS)
AUG FEM SZ4 4 REV POST STRL LF (Miscellaneous) ×2 IMPLANT
AUGMENT POST FEM SZ4 4 (Miscellaneous) IMPLANT
BAG COUNTER SPONGE SURGICOUNT (BAG) IMPLANT
BAG SPEC THK2 15X12 ZIP CLS (MISCELLANEOUS)
BAG SPNG CNTER NS LX DISP (BAG)
BAG ZIPLOCK 12X15 (MISCELLANEOUS) IMPLANT
BANDAGE ACE 6X5 VEL STRL LF (GAUZE/BANDAGES/DRESSINGS) IMPLANT
BENZOIN TINCTURE PRP APPL 2/3 (GAUZE/BANDAGES/DRESSINGS) IMPLANT
BLADE SAG 18X100X1.27 (BLADE) ×2 IMPLANT
BLADE SAW SGTL 11.0X1.19X90.0M (BLADE) IMPLANT
BLADE SURG SZ10 CARB STEEL (BLADE) ×4 IMPLANT
BNDG ELASTIC 6X5.8 VLCR STR LF (GAUZE/BANDAGES/DRESSINGS) ×2 IMPLANT
BSPLAT TIB 3 CMNT REV ROT PLAT (Knees) ×1 IMPLANT
CEMENT HV SMART SET (Cement) IMPLANT
CLOTH BEACON ORANGE TIMEOUT ST (SAFETY) ×2 IMPLANT
COMP FEM ATTUNE CRS SZ4 LT (Femur) ×1 IMPLANT
COMPONENT FEM ATN CR SZ4 LT (Femur) IMPLANT
COVER SURGICAL LIGHT HANDLE (MISCELLANEOUS) ×2 IMPLANT
CUFF TOURN SGL QUICK 34 (TOURNIQUET CUFF) ×1
CUFF TRNQT CYL 34X4.125X (TOURNIQUET CUFF) ×2 IMPLANT
DRAPE INCISE IOBAN 66X45 STRL (DRAPES) ×6 IMPLANT
DRAPE U-SHAPE 47X51 STRL (DRAPES) ×2 IMPLANT
DURAPREP 26ML APPLICATOR (WOUND CARE) ×2 IMPLANT
ELECT REM PT RETURN 15FT ADLT (MISCELLANEOUS) ×2 IMPLANT
EVACUATOR 1/8 PVC DRAIN (DRAIN) IMPLANT
GAUZE PAD ABD 8X10 STRL (GAUZE/BANDAGES/DRESSINGS) ×2 IMPLANT
GAUZE SPONGE 4X4 12PLY STRL (GAUZE/BANDAGES/DRESSINGS) ×2 IMPLANT
GAUZE XEROFORM 1X8 LF (GAUZE/BANDAGES/DRESSINGS) IMPLANT
GAUZE XEROFORM 5X9 LF (GAUZE/BANDAGES/DRESSINGS) IMPLANT
GLOVE BIO SURGEON STRL SZ7.5 (GLOVE) ×2 IMPLANT
GLOVE BIOGEL PI IND STRL 8 (GLOVE) ×4 IMPLANT
GLOVE ECLIPSE 8.0 STRL XLNG CF (GLOVE) ×2 IMPLANT
GOWN STRL REUS W/ TWL XL LVL3 (GOWN DISPOSABLE) ×4 IMPLANT
GOWN STRL REUS W/TWL XL LVL3 (GOWN DISPOSABLE) ×2
HANDPIECE INTERPULSE COAX TIP (DISPOSABLE) ×1
HOLDER FOLEY CATH W/STRAP (MISCELLANEOUS) IMPLANT
IMMOBILIZER KNEE 20 (SOFTGOODS) ×1
IMMOBILIZER KNEE 20 THIGH 36 (SOFTGOODS) ×2 IMPLANT
INSERT TIB CMT ATTUNE RP SZ3 (Knees) IMPLANT
INSERT TIB CRS ATTUNE SZ4 12 (Insert) IMPLANT
KIT TURNOVER KIT A (KITS) IMPLANT
NDL SAFETY ECLIP 18X1.5 (MISCELLANEOUS) IMPLANT
PACK TOTAL KNEE CUSTOM (KITS) ×2 IMPLANT
PADDING CAST COTTON 6X4 STRL (CAST SUPPLIES) ×4 IMPLANT
PATELLA 3PEG METAL POLY (Knees) IMPLANT
PIN FIX SIGMA LCS THRD HI (PIN) IMPLANT
PROTECTOR NERVE ULNAR (MISCELLANEOUS) ×2 IMPLANT
SET HNDPC FAN SPRY TIP SCT (DISPOSABLE) ×2 IMPLANT
SET PAD KNEE POSITIONER (MISCELLANEOUS) ×2 IMPLANT
SPIKE FLUID TRANSFER (MISCELLANEOUS) IMPLANT
STAPLER VISISTAT 35W (STAPLE) IMPLANT
STEM CEMT ATTUNE 14X80 (Knees) IMPLANT
STEM REV CEMENTED 14X50MM (Stem) IMPLANT
STRIP CLOSURE SKIN 1/2X4 (GAUZE/BANDAGES/DRESSINGS) IMPLANT
SUT MNCRL AB 4-0 PS2 18 (SUTURE) IMPLANT
SUT VIC AB 0 CT1 36 (SUTURE) ×2 IMPLANT
SUT VIC AB 1 CT1 36 (SUTURE) ×4 IMPLANT
SUT VIC AB 2-0 CT1 27 (SUTURE) ×2
SUT VIC AB 2-0 CT1 TAPERPNT 27 (SUTURE) ×4 IMPLANT
SWAB COLLECTION DEVICE MRSA (MISCELLANEOUS) IMPLANT
SWAB CULTURE ESWAB REG 1ML (MISCELLANEOUS) IMPLANT
SYR 3ML LL SCALE MARK (SYRINGE) IMPLANT
TOWER CARTRIDGE SMART MIX (DISPOSABLE) IMPLANT
TRAY FOLEY MTR SLVR 16FR STAT (SET/KITS/TRAYS/PACK) ×2 IMPLANT
TUBE KAMVAC SUCTION (TUBING) IMPLANT
WATER STERILE IRR 1000ML POUR (IV SOLUTION) ×2 IMPLANT

## 2021-10-08 NOTE — Interval H&P Note (Signed)
History and Physical Interval Note: The patient understands that she is here today for a revision of a failed left total knee arthroplasty.  There has been no acute or interval change in her medical status.  See H&P.  The risks and benefits of surgery been explained in detail and informed consent is obtained.  The left operative knee has been marked.  10/08/2021 12:03 PM  Diane Daugherty  has presented today for surgery, with the diagnosis of FAILED LEFT TOTAL KNEE.  The various methods of treatment have been discussed with the patient and family. After consideration of risks, benefits and other options for treatment, the patient has consented to  Procedure(s): LEFT TOTAL KNEE REVISION (Left) as a surgical intervention.  The patient's history has been reviewed, patient examined, no change in status, stable for surgery.  I have reviewed the patient's chart and labs.  Questions were answered to the patient's satisfaction.     Mcarthur Rossetti

## 2021-10-08 NOTE — Op Note (Signed)
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 Ninfa Linden, MD Assistant: Benjiman Core, PA-C  Anesthesia: #1 left lower extremity regional block, #2 General EBL: 100 cc Tourniquet time: Less than an hour and a half Antibiotics: 2 g IV Ancef Complications: None  Indications: The patient is a 81 year old female who in 2016 underwent a primary left total knee arthroplasty using the DePuy LCS knee system.  This was performed by one of my partners.  She had really done well for a while but developed loosening of the tibial component as well as some cystic changes in the tibia.  She does have a history remotely of multiple myeloma but does not look like this involves the components.  Three-phase bone scan did show uptake around both the femoral and tibial components.  She has had recurrent effusion and a work-up for infection was negative.  She reports a little bit of instability with her left knee but mainly pain.  At this point we have recommended revision arthroplasty with likely removal of all components and assessment intraoperatively for infection and then likely hopefully primary revision of the components as indicated interoperatively.  We had a long and thorough discussion about the risk of acute blood loss anemia, neurovascular injury, fracture, infection, DVT, implant failure, soft tissue healing issues.  We talked about her goals being hopefully decrease pain, improve mobility and overall improve quality of life.  Procedure description:  After informed consent was obtained and appropriate left lower extremity was marked, anesthesia obtained a regional block of the left lower extremity.  The patient was then brought to the operating room and placed upon the operating table.  General anesthesia was obtained.  A Foley catheter was placed and a nonsterile tourniquet is placed around her upper left thigh.  Her left thigh, knee, leg, ankle and foot were prepped and draped with DuraPrep and sterile drapes.  A timeout was called and she was notified is correct patient correct left knee.  We then used Esmarch wrap out the leg and the tourniquet inflated to 300 mm of pressure.  We then made a midline incision over previous incision and carried this proximally distally.  We dissect down the knee joint carried out a medial parapatellar arthrotomy finding effusion of the knee that was clear.  We sent these off and there is no evidence of acutely of infection organisms.  There was some synovitis in the but obviously evidence of metallosis.  We remove scar tissue from around the knee and then was easily able to remove the femoral component and the tibial component showing no good interface between the medial part of the tibial tray and the bone itself.  Again the complaints came off easily.  This was a metal-backed patella component we removed the polyethylene liner but the patella component was solid and did not seem to have any issues.  I then irrigated the knee thoroughly with normal saline solution using pulsatile lavage.  We then dried the knee well and started the revision process.  We did a freshening proximal tibia cut and then chose  a size 3 tibial revision tray for coverage over the tibial plateau so the rotation of the tibial tubercle and the femur.  We then reamed and drilled so we could place a 14 x 50 cemented stem.  I wanted to make sure this went past the proximal tibia cyst.  I could see the cyst area and fortunately does not weaken bone around  it.  Once we then prepared the tibia went to the femur and reamed from a size 11 reamer up to a size 14 reamer for a 14 x 80 cemented stem.  We chose a size 4 left femoral component and made a freshening distal femoral cut.  We then put a 4-in-1 cutting block and set our rotation using a block and making sure the flexion extension gaps were equal.  We then made our chamfer cuts and a femoral box cut.  Next we then trialed our femoral component with 2 posterior augments which were 4 mm medial and lateral.  We placed our size 4 femur together with a 14 x 50 stem.  We then trialed up to a 12 mm RP CRS insert.  I did change out the patella poly while we are there.  I then put the knee through several cycles of motion with the trial implants in place and we are pleased with her range of motion and stability.  We then removed all trial instrumentation from the knee and then irrigated Foley again with more normal saline solution.  Once we dried it well we mixed our cement and cemented our 14 x 50 stem on a size 3 tibial tray and then cemented our size 4 left CRS femur with a 14 x 80 cemented stem.  We then placed our real 12 mm RP polyethylene insert size 4.  We held the knee fully extended and compressed the knee while the cement hardened.  Once the cemented hardened we let the tourniquet down and hemostasis was obtained electrocautery.  We then closed the arthrotomy with interrupted #1 Vicryl suture followed by 0 Vicryl close deep tissue and 2-0 Vicryl close subcutaneous tissue.  The skin was closed with staples.  Well-padded sterile dressings applied.  The patient was awakened, extubated and taken recovery room in stable condition with all final counts being correct and no complications noted.  Of note Benjiman Core, PA-C did assist the entire case from beginning to end and his assistance was medically necessary for retracting and managing soft tissues as well as helping guide implant placement.  He was directly involved  with a layered closure of the wound as well.

## 2021-10-08 NOTE — Anesthesia Procedure Notes (Signed)
Anesthesia Regional Block: Adductor canal block   Pre-Anesthetic Checklist: , timeout performed,  Correct Patient, Correct Site, Correct Laterality,  Correct Procedure, Correct Position, site marked,  Risks and benefits discussed,  Surgical consent,  Pre-op evaluation,  At surgeon's request and post-op pain management  Laterality: Left  Prep: chloraprep       Needles:  Injection technique: Single-shot  Needle Type: Stimulator Needle - 80     Needle Length: 10cm  Needle Gauge: 21     Additional Needles:   Narrative:  Start time: 10/08/2021 12:44 PM End time: 10/08/2021 12:54 PM Injection made incrementally with aspirations every 5 mL.  Performed by: Personally  Anesthesiologist: Duane Boston, MD

## 2021-10-08 NOTE — Anesthesia Procedure Notes (Signed)
Procedure Name: LMA Insertion Date/Time: 10/08/2021 1:26 PM  Performed by: [place] Manus Rudd, CRNAPre-anesthesia Checklist: Patient identified, Emergency Drugs available, Suction available and Patient being monitored Patient Re-evaluated:Patient Re-evaluated prior to induction Oxygen Delivery Method: Circle system utilized Preoxygenation: Pre-oxygenation with 100% oxygen Induction Type: IV induction Ventilation: Mask ventilation without difficulty LMA: LMA inserted LMA Size: 4.0 Number of attempts: 1 Airway Equipment and Method: Bite block Placement Confirmation: positive ETCO2 Tube secured with: Tape Dental Injury: Teeth and Oropharynx as per pre-operative assessment

## 2021-10-08 NOTE — Anesthesia Preprocedure Evaluation (Addendum)
Anesthesia Evaluation  Patient identified by MRN, date of birth, ID band Patient awake    Reviewed: Allergy & Precautions, NPO status , Patient's Chart, lab work & pertinent test results  History of Anesthesia Complications (+) PONV and history of anesthetic complications  Airway Mallampati: I  TM Distance: >3 FB Neck ROM: Full    Dental  (+) Dental Advisory Given, Edentulous Upper   Pulmonary sleep apnea and Continuous Positive Airway Pressure Ventilation ,    Pulmonary exam normal        Cardiovascular hypertension, Pt. on medications Normal cardiovascular exam     Neuro/Psych negative neurological ROS     GI/Hepatic negative GI ROS, Neg liver ROS,   Endo/Other  negative endocrine ROSObese  Renal/GU Renal InsufficiencyRenal disease     Musculoskeletal negative musculoskeletal ROS (+)   Abdominal   Peds  Hematology  (+) Blood dyscrasia, anemia ,   Anesthesia Other Findings   Reproductive/Obstetrics                           Anesthesia Physical Anesthesia Plan  ASA: 3  Anesthesia Plan: General   Post-op Pain Management: Celebrex PO (pre-op)* and Tylenol PO (pre-op)*   Induction: Intravenous  PONV Risk Score and Plan: 4 or greater and Ondansetron, Dexamethasone, Diphenhydramine and Treatment may vary due to age or medical condition  Airway Management Planned: LMA and Oral ETT  Additional Equipment:   Intra-op Plan:   Post-operative Plan: Extubation in OR  Informed Consent: I have reviewed the patients History and Physical, chart, labs and discussed the procedure including the risks, benefits and alternatives for the proposed anesthesia with the patient or authorized representative who has indicated his/her understanding and acceptance.     Dental advisory given  Plan Discussed with: Anesthesiologist and CRNA  Anesthesia Plan Comments: (Pt refuses SAB.)     Anesthesia  Quick Evaluation

## 2021-10-08 NOTE — Transfer of Care (Signed)
Immediate Anesthesia Transfer of Care Note  Patient: Diane Daugherty  Procedure(s) Performed: LEFT TOTAL KNEE REVISION (Left: Knee)  Patient Location: PACU  Anesthesia Type:General  Level of Consciousness: sedated  Airway & Oxygen Therapy: Patient Spontanous Breathing and Patient connected to face mask oxygen  Post-op Assessment: Report given to RN and Post -op Vital signs reviewed and stable  Post vital signs: Reviewed and stable  Last Vitals:  Vitals Value Taken Time  BP 136/71 10/08/21 1553  Temp    Pulse 80 10/08/21 1555  Resp 12 10/08/21 1555  SpO2 97 % 10/08/21 1555  Vitals shown include unvalidated device data.  Last Pain:  Vitals:   10/08/21 1044  TempSrc: Oral         Complications: No notable events documented.

## 2021-10-08 NOTE — Progress Notes (Signed)
Pt refused cpap tonight. Pt said she will wait for someone to bring her mask and tubing from home. Pt is resting well at this time, Machine remained in the room if pt changes her mind.

## 2021-10-09 LAB — BASIC METABOLIC PANEL
Anion gap: 5 (ref 5–15)
BUN: 19 mg/dL (ref 8–23)
CO2: 29 mmol/L (ref 22–32)
Calcium: 8.9 mg/dL (ref 8.9–10.3)
Chloride: 105 mmol/L (ref 98–111)
Creatinine, Ser: 1.12 mg/dL — ABNORMAL HIGH (ref 0.44–1.00)
GFR, Estimated: 50 mL/min — ABNORMAL LOW (ref >60)
Glucose, Bld: 136 mg/dL — ABNORMAL HIGH (ref 70–99)
Potassium: 4.8 mmol/L (ref 3.5–5.1)
Sodium: 139 mmol/L (ref 135–145)

## 2021-10-09 LAB — CBC
HCT: 29.1 % — ABNORMAL LOW (ref 36.0–46.0)
Hemoglobin: 9.9 g/dL — ABNORMAL LOW (ref 12.0–15.0)
MCH: 32.9 pg (ref 26.0–34.0)
MCHC: 34 g/dL (ref 30.0–36.0)
MCV: 96.7 fL (ref 80.0–100.0)
Platelets: 150 10*3/uL (ref 150–400)
RBC: 3.01 MIL/uL — ABNORMAL LOW (ref 3.87–5.11)
RDW: 14 % (ref 11.5–15.5)
WBC: 7.3 10*3/uL (ref 4.0–10.5)
nRBC: 0 % (ref 0.0–0.2)

## 2021-10-09 NOTE — Plan of Care (Signed)
  Problem: Education: Goal: Knowledge of the prescribed therapeutic regimen will improve Outcome: Progressing   Problem: Nutrition: Goal: Adequate nutrition will be maintained Outcome: Progressing

## 2021-10-09 NOTE — Discharge Instructions (Addendum)

## 2021-10-09 NOTE — NC FL2 (Signed)
[place] LEVEL OF CARE SCREENING TOOL     IDENTIFICATION  Patient Name: Diane Daugherty Birthdate: 19-Aug-1940 Sex: female Admission Date (Current Location): 10/08/2021  [facility] [place] Number:  Herbalist and Address:  [facility],  [address], [place]      Provider Number: [id number]  Attending Physician Name and Address:  Mcarthur Rossetti  Relative Name and Phone Number:  Redd,Vanessa Daughter [phone]  [phone]  [facility] Daughter [phone]  [phone]  Fees,Labonnie Daughter [phone]  [phone]    Current Level of Care: Hospital Recommended Level of Care: [place] Prior Approval Number:    Date Approved/Denied:   PASRR Number: [id number]  Discharge Plan: SNF    Current Diagnoses: Patient Active Problem List   Diagnosis Date Noted   Status post revision of total replacement of left knee 10/08/2021   Failed total left knee replacement ([place]) 10/07/2021   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   Stage 3a chronic kidney disease ([place]) 05/27/2021   History of multiple myeloma 05/27/2021   Primary osteoarthritis of left knee 10/07/2014   History of left knee replacement 10/07/2014   Asthma, chronic 08/30/2012   Essential hypertension 08/30/2012   Osteoarthritis of right knee 08/30/2012   Sleep apnea 08/30/2012   Severe obesity (BMI >= 40) ([place]) 08/30/2012    Orientation RESPIRATION BLADDER Height & Weight     Self, Time, Situation, Place  O2 (2L) Continent Weight:   Height:     BEHAVIORAL SYMPTOMS/MOOD NEUROLOGICAL BOWEL NUTRITION STATUS      Continent Diet  AMBULATORY STATUS COMMUNICATION OF NEEDS Skin   Limited Assist Verbally Surgical wounds                       Personal Care Assistance Level of Assistance  Bathing, Feeding, Dressing Bathing Assistance: Limited assistance Feeding assistance: Independent Dressing Assistance: Limited assistance      Functional Limitations Info  Sight, Hearing, Speech Sight Info: Adequate Hearing Info: Adequate Speech Info: Adequate    SPECIAL CARE FACTORS FREQUENCY  PT (By licensed PT), OT (By licensed OT)     PT Frequency: Minimum 5x a week OT Frequency: Minimum 5x a week            Contractures Contractures Info: Not present    Additional Factors Info  Code Status, Allergies Code Status Info: Full Code Allergies Info: Levaquin (Levofloxacin)   Morphine And Related   Other   Oxycodone   Naprosyn (Naproxen)           Current Medications (10/09/2021):  This is the current hospital active medication list Current Facility-Administered Medications  Medication Dose Route Frequency Provider Last Rate Last Admin   0.9 %  sodium chloride infusion   Intravenous Continuous Mcarthur Rossetti, MD 75 mL/hr at 10/08/21 2114 New Bag at 10/08/21 2114   acetaminophen (TYLENOL) tablet 325-650 mg  325-650 mg Oral Q6H PRN Mcarthur Rossetti, MD       alum & mag hydroxide-simeth (MAALOX/MYLANTA) 200-200-20 MG/5ML suspension 30 mL  30 mL Oral Q4H PRN Mcarthur Rossetti, MD       amLODipine (NORVASC) tablet 5 mg  5 mg Oral Daily Mcarthur Rossetti, MD   5 mg at 10/09/21 0824   apixaban (ELIQUIS) tablet 2.5 mg  2.5 mg Oral Q12H Mcarthur Rossetti, MD   2.5 mg at 10/09/21 0742   baclofen (LIORESAL) tablet 10 mg  10 mg Oral TID PRN Ninfa Linden,  Lind Guest, MD       docusate sodium (COLACE) capsule 100 mg  100 mg Oral BID Mcarthur Rossetti, MD   100 mg at 10/09/21 0824   HYDROcodone-acetaminophen (NORCO) 7.5-325 MG per tablet 1-2 tablet  1-2 tablet Oral Q4H PRN Mcarthur Rossetti, MD   2 tablet at 10/09/21 1724   HYDROcodone-acetaminophen (NORCO/VICODIN) 5-325 MG per tablet 1-2 tablet  1-2 tablet Oral Q4H PRN Mcarthur Rossetti, MD       irbesartan (AVAPRO) tablet 150 mg  150 mg Oral BID Mcarthur Rossetti, MD   150 mg at 10/09/21 3225   menthol-cetylpyridinium  (CEPACOL) lozenge 3 mg  1 lozenge Oral PRN Mcarthur Rossetti, MD       Or   phenol (CHLORASEPTIC) mouth spray 1 spray  1 spray Mouth/Throat PRN Mcarthur Rossetti, MD       metoCLOPramide (REGLAN) tablet 5-10 mg  5-10 mg Oral Q8H PRN Mcarthur Rossetti, MD       Or   metoCLOPramide (REGLAN) injection 5-10 mg  5-10 mg Intravenous Q8H PRN Mcarthur Rossetti, MD       morphine (PF) 2 MG/ML injection 0.5-1 mg  0.5-1 mg Intravenous Q2H PRN Mcarthur Rossetti, MD       ondansetron [facility]) tablet 4 mg  4 mg Oral Q6H PRN Mcarthur Rossetti, MD       Or   ondansetron [facility]) injection 4 mg  4 mg Intravenous Q6H PRN Mcarthur Rossetti, MD       pantoprazole (PROTONIX) EC tablet 40 mg  40 mg Oral Daily Mcarthur Rossetti, MD   40 mg at 10/09/21 6720     Discharge Medications: Please see discharge summary for a list of discharge medications.  Relevant Imaging Results:  Relevant Lab Results:   Additional Information SSN [id number]  Ross Ludwig, LCSW

## 2021-10-09 NOTE — Anesthesia Postprocedure Evaluation (Signed)
Anesthesia Post Note  Patient: Diane Daugherty  Procedure(s) Performed: LEFT TOTAL KNEE REVISION (Left: Knee)     Patient location during evaluation: PACU Anesthesia Type: General Level of consciousness: sedated Pain management: pain level controlled Vital Signs Assessment: post-procedure vital signs reviewed and stable Respiratory status: spontaneous breathing and respiratory function stable Cardiovascular status: stable Postop Assessment: no apparent nausea or vomiting Anesthetic complications: no   No notable events documented.                Ladarryl Wrage DANIEL

## 2021-10-09 NOTE — Plan of Care (Signed)
  Problem: Pain Management: Goal: Pain level will decrease with appropriate interventions Outcome: Progressing   Problem: Pain Managment: Goal: General experience of comfort will improve Outcome: Progressing

## 2021-10-09 NOTE — Plan of Care (Signed)
  Problem: Activity: Goal: Ability to avoid complications of mobility impairment will improve Outcome: Progressing Goal: Range of joint motion will improve Outcome: Progressing   Problem: Pain Management: Goal: Pain level will decrease with appropriate interventions Outcome: Progressing   Problem: Safety: Goal: Ability to remain free from injury will improve Outcome: Progressing   

## 2021-10-09 NOTE — TOC Initial Note (Signed)
Transition of Care [facility]) - Initial/Assessment Note    Patient Details  Name: Diane Daugherty MRN: [id number] Date of Birth: [birth year]  [facility] CM/SW Contact:    Ross Ludwig, LCSW Phone Number: 10/09/2021, 6:00 PM  Clinical Narrative:                  Patient is a 81 year old female who is alert and oriented x4.  CSW spoke to patient's daughter Epimenio Foot who said patient would like SNF, and would prefer [facility] if possible.  CSW was given permission to begin bed search in [facility].  CSW awaiting for bed offers.  Expected Discharge Plan: Skilled Nursing Facility Barriers to Discharge: Continued Medical Work up   Patient Goals and CMS Choice Patient states their goals for this hospitalization and ongoing recovery are:: To return back home after short term rehab. CMS Medicare.gov Compare Post Acute Care list provided to:: Patient Represenative (must comment) Choice offered to / list presented to : Adult Children  Expected Discharge Plan and Services Expected Discharge Plan: [place]                                              Prior Living Arrangements/Services   Lives with:: Adult Children Patient language and need for interpreter reviewed:: Yes Do you feel safe going back to the place where you live?: No   Patient feels she needs short term rehab before she is able to return back home.  Need for Family Participation in Patient Care: No (Comment) Care giver support system in place?: No (comment)   Criminal Activity/Legal Involvement Pertinent to Current Situation/Hospitalization: No - Comment as needed  Activities of Daily Living      Permission Sought/Granted Permission sought to share information with : Case Manager, Family Supports, Customer service manager Permission granted to share information with : Yes, Verbal Permission Granted  Share Information with NAME: Redd,Vanessa Daughter [phone]   [phone]  [place] Daughter [phone]  [phone]  Damiano,Labonnie Daughter [phone]  [phone]  Permission granted to share info w AGENCY: SNF admissions        Emotional Assessment Appearance:: Appears stated age Attitude/Demeanor/Rapport: Engaged Affect (typically observed): Appropriate, Calm, Accepting, Stable Orientation: : Oriented to Place, Oriented to  Time, Oriented to Situation, Oriented to Self Alcohol / Substance Use: Not Applicable Psych Involvement: No (comment)  Admission diagnosis:  Status post revision of total replacement of left knee [Z96.652] Patient Active Problem List   Diagnosis Date Noted   Status post revision of total replacement of left knee 10/08/2021   Failed total left knee replacement ([place]) 10/07/2021   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   Stage 3a chronic kidney disease ([place]) 05/27/2021   History of multiple myeloma 05/27/2021   Primary osteoarthritis of left knee 10/07/2014   History of left knee replacement 10/07/2014   Asthma, chronic 08/30/2012   Essential hypertension 08/30/2012   Osteoarthritis of right knee 08/30/2012   Sleep apnea 08/30/2012   Severe obesity (BMI >= 40) ([place]) 08/30/2012   PCP:  Horald Pollen, MD Pharmacy:   [id number] [id number]: [phone]: [address]Phone: [phone]: [phone]    Social Determinants of Health (SDOH) Interventions  Readmission Risk Interventions     No data to display

## 2021-10-09 NOTE — Evaluation (Signed)
Physical Therapy Evaluation Patient Details Name: Diane Daugherty MRN: [id number] DOB: September 17, 1940 Today's Date: 10/09/2021  History of Present Illness  Pt is a 81 y.o. female s/p Left knee revision arthroplasty with revision of all components on 10/08/21. PMH significant for carpal tunnel syndrome, CKD, HTN, PVD, B TKA, and posterior lumbar fusion (2005).   Clinical Impression  Pt is POD #1 s/p Lt knee revision resulting in the deficits listed below (see PT Problem List). Pt performed bed mobility and sit to stand transfers with MIN A and cues for safe hand placement. Pt completed step pivot transfer to [facility] and ambulated total of ~58f to recliner chair with MIN A and increased time due to pain. Cues provided for step to gait patten with no LOB observed. PT reviewed LE circulation interventions for promotion of DVT prevention, pt demonstrated understanding. Pt lives with her daughter who works and has limited caregiver support. Per pt and daughter pt is home alone most of the day as family members/friends work. Recommend short term rehab stay at SNF prior to d/c home as pt is presenting below baseline and requires increased assist for all mobility.  Pt will benefit from skilled PT to maximize functional mobility and increase independence.         Recommendations for follow up therapy are one component of a multi-disciplinary discharge planning process, led by the attending physician.  Recommendations may be updated based on patient status, additional functional criteria and insurance authorization.  Follow Up Recommendations Skilled nursing-short term rehab (<3 hours/day) Can patient physically be transported by private vehicle: Yes    Assistance Recommended at Discharge Frequent or constant Supervision/Assistance  Patient can return home with the following  A little help with walking and/or transfers;A little help with bathing/dressing/bathroom;Help with stairs or ramp for entrance;Assist for  transportation;Assistance with cooking/housework    Equipment Recommendations None recommended by PT (pt reports she owns RW)  Recommendations for Other Services       Functional Status Assessment Patient has had a recent decline in their functional status and demonstrates the ability to make significant improvements in function in a reasonable and predictable amount of time.     Precautions / Restrictions Precautions Precautions: Knee;Fall Restrictions Weight Bearing Restrictions: No Other Position/Activity Restrictions: WBAT      Mobility  Bed Mobility Overal bed mobility: Needs Assistance Bed Mobility: Supine to Sit     Supine to sit: Min assist, HOB elevated     General bed mobility comments: assist for progession of L LE to EOB, use of bed rails to bring trunk to upright, increased time    Transfers Overall transfer level: Needs assistance Equipment used: Rolling walker (2 wheels) Transfers: Sit to/from Stand, Bed to chair/wheelchair/BSC Sit to Stand: Min assist   Step pivot transfers: Min assist       General transfer comment: x2 attempts for successful upright stand. MIN A with cues for hand placement, steadying in standing. [facility]    Ambulation/Gait Ambulation/Gait assistance: Min assist Gait Distance (Feet): 4 Feet Assistive device: Rolling walker (2 wheels) Gait Pattern/deviations: Step-to pattern, Decreased stride length, Decreased weight shift to left, Trunk flexed, Narrow base of support Gait velocity: decreased     General Gait Details: short distance ambulation to recliner chair in room with increased time and heavy use of UEs due to pain in L knee. Reported onset of nausea follwing transfers. Pt and daughter report that she did not eat much and thinks nausea  could be due to medications.  Stairs            Wheelchair Mobility    Modified Rankin (Stroke Patients Only)       Balance                                              Pertinent Vitals/Pain Pain Assessment Pain Assessment: Faces Faces Pain Scale: Hurts whole lot Pain Location: Lt knee Pain Descriptors / Indicators: Grimacing, Guarding, Sore, Discomfort Pain Intervention(s): Limited activity within patient's tolerance, Monitored during session, Repositioned, Ice applied    Home Living Family/patient expects to be discharged to:: Private residence Living Arrangements: Children Available Help at Discharge: Family;Available PRN/intermittently Type of Home: House Home Access: Stairs to enter Entrance Stairs-Rails: None Entrance Stairs-Number of Steps: 1 Alternate Level Stairs-Number of Steps: 17 Home Layout: Two level;1/2 bath on main level Home Equipment: Conservation officer, nature (2 wheels);Cane - quad;Cane - single point;BSC/3in1 Additional Comments: Pt reports 1 step to enter but 17 steps to get up to bedroom/full bath. States she has nowhere to sleep on main level.    Prior Function Prior Level of Function : Independent/Modified Independent;Driving             Mobility Comments: SPC in community, no AD around home.       Hand Dominance        Extremity/Trunk Assessment   Upper Extremity Assessment Upper Extremity Assessment: Overall WFL for tasks assessed    Lower Extremity Assessment Lower Extremity Assessment: Generalized weakness    Cervical / Trunk Assessment Cervical / Trunk Assessment: Normal  Communication   Communication: No difficulties  Cognition Arousal/Alertness: Awake/alert Behavior During Therapy: WFL for tasks assessed/performed Overall Cognitive Status: Within Functional Limits for tasks assessed                                          General Comments General comments (skin integrity, edema, etc.): 2L upon entry, O2 low of 88-89% on RA with mobility. 92% at end of session on RA. Pt reports no O2 use during daytime but CPAP at night at baseline. Review of proper  technique and frequency of incentive spirometer use.    Exercises Total Joint Exercises Ankle Circles/Pumps: AROM, Both, 20 reps, Seated Quad Sets: AROM, Both, 20 reps, Seated Gluteal Sets: AROM, Left, 10 reps, Seated   Assessment/Plan    PT Assessment Patient needs continued PT services  PT Problem List Decreased strength;Decreased range of motion;Decreased activity tolerance;Decreased balance;Decreased mobility;Pain       PT Treatment Interventions DME instruction;Gait training;Stair training;Functional mobility training;Therapeutic activities;Therapeutic exercise;Balance training;Patient/family education    PT Goals (Current goals can be found in the Care Plan section)  Acute Rehab PT Goals Patient Stated Goal: Go to rehab to improve independence PT Goal Formulation: With patient/family Time For Goal Achievement: 10/23/21 Potential to Achieve Goals: Good    Frequency 7X/week     Co-evaluation               AM-PAC PT "6 Clicks" Mobility  Outcome Measure Help needed turning from your back to your side while in a flat bed without using bedrails?: A Little Help needed moving from lying on your back to sitting on the side of a flat bed without using bedrails?:  A Little Help needed moving to and from a bed to a chair (including a wheelchair)?: A Little Help needed standing up from a chair using your arms (e.g., wheelchair or bedside chair)?: A Little Help needed to walk in hospital room?: A Little Help needed climbing 3-5 steps with a railing? : A Lot 6 Click Score: 17    End of Session Equipment Utilized During Treatment: Gait belt Activity Tolerance: Patient tolerated treatment well;Patient limited by pain Patient left: in chair;with call bell/phone within reach;with chair alarm set Nurse Communication: Mobility status PT Visit Diagnosis: Muscle weakness (generalized) (M62.81);Difficulty in walking, not elsewhere classified (R26.2);Pain Pain - Right/Left: Left Pain  - part of body: Knee    Time: 6060-0459 PT Time Calculation (min) (ACUTE ONLY): 30 min   Charges:   PT Evaluation $PT Eval Low Complexity: 1 Low PT Treatments $Therapeutic Activity: 8-22 mins        Festus Barren PT, DPT  Acute Rehabilitation Services  Office [phone]  10/09/2021, 2:53 PM

## 2021-10-09 NOTE — Progress Notes (Signed)
Patient ID: Diane Daugherty, female   DOB: 1940/06/24, 81 y.o.   MRN: [id number] The patient looks good this morning.  Her vital signs are stable.  Her labs are stable.  She does have acute on chronic blood loss anemia but is asymptomatic from this.  She is 81 years old.  I did show her the x-rays of her revision arthroplasty.  I did change her left knee dressing and her incision looks good.  Her calf is soft.  She definitely needs physical therapy to help with her mobility.  She is also requesting short-term skilled nursing placement since she has a lot of stairs to traverse at her home.  Apparently she has stated skilled nursing before after surgeries.  We will consult the transitional care team.  She will be here through the weekend.

## 2021-10-10 MED ORDER — HYDROMORPHONE HCL 2 MG PO TABS
2.0000 mg | ORAL_TABLET | Freq: Four times a day (QID) | ORAL | Status: DC | PRN
  Administered 2021-10-10 – 2021-10-11 (×4): 2 mg via ORAL
  Filled 2021-10-10 (×4): qty 1

## 2021-10-10 MED ORDER — MAGNESIUM CITRATE PO SOLN
1.0000 | Freq: Once | ORAL | Status: AC
  Administered 2021-10-10: 1 via ORAL
  Filled 2021-10-10: qty 296

## 2021-10-10 MED ORDER — FLUTICASONE PROPIONATE 50 MCG/ACT NA SUSP
2.0000 | Freq: Every day | NASAL | Status: DC
  Administered 2021-10-10 – 2021-10-11 (×2): 2 via NASAL
  Filled 2021-10-10: qty 16

## 2021-10-10 MED ORDER — BISACODYL 5 MG PO TBEC
5.0000 mg | DELAYED_RELEASE_TABLET | Freq: Every day | ORAL | Status: DC | PRN
  Administered 2021-10-10: 5 mg via ORAL
  Filled 2021-10-10: qty 1

## 2021-10-10 NOTE — Plan of Care (Signed)

## 2021-10-10 NOTE — Progress Notes (Signed)
Subjective: 2 Days Post-Op Procedure(s) (LRB): LEFT TOTAL KNEE REVISION (Left) Patient reports pain as mild.    Objective: Vital signs in last 24 hours: Temp:  [98 F (36.7 C)-99.3 F (37.4 C)] 99 F (37.2 C) (09/17 0509) Pulse Rate:  [67-86] 78 (09/17 0509) Resp:  [17-18] 18 (09/17 0509) BP: (115-143)/(51-57) 115/53 (09/17 0509) SpO2:  [89 %-99 %] 97 % (09/17 0509)  Intake/Output from previous day: 09/16 0701 - 09/17 0700 In: 910 [P.O.:360; I.V.:550] Out: -  Intake/Output this shift: No intake/output data recorded.  Recent Labs    10/09/21 0315  HGB 9.9*   Recent Labs    10/09/21 0315  WBC 7.3  RBC 3.01*  HCT 29.1*  PLT 150   Recent Labs    10/09/21 0315  NA 139  K 4.8  CL 105  CO2 29  BUN 19  CREATININE 1.12*  GLUCOSE 136*  CALCIUM 8.9   No results for input(s): "LABPT", "INR" in the last 72 hours.  Neurologically intact Neurovascular intact Sensation intact distally Intact pulses distally Dorsiflexion/Plantar flexion intact Incision: scant drainage No cellulitis present Compartment soft   Assessment/Plan: 2 Days Post-Op Procedure(s) (LRB): LEFT TOTAL KNEE REVISION (Left) Advance diet Up with therapy D/C IV fluids Discharge to SNF .  Patient lives with her daughter who works full time and has 17 steps to get to her bedroom.  May d/c once insurance approves and bed is available.  Will likely be within the next day or so.  Patient prefers [facility].  WBAT LLE    Anticipated LOS equal to or greater than 2 midnights due to - Age 79 and older with one or more of the following:  - Obesity  - Expected need for hospital services (PT, OT, Nursing) required for safe  discharge  - Anticipated need for postoperative skilled nursing care or inpatient rehab  - Active co-morbidities: CKD and multiple myeloma OR   - Unanticipated findings during/Post Surgery: Slow post-op progression: GI, pain control, mobility  - Patient is a high risk of  re-admission due to: Barriers to post-acute care (logistical, no family support in home)   Diane Daugherty 10/10/2021, 8:34 AM

## 2021-10-10 NOTE — Plan of Care (Signed)
  Problem: Education: Goal: Knowledge of General Education information will improve Description Including pain rating scale, medication(s)/side effects and non-pharmacologic comfort measures Outcome: Progressing   

## 2021-10-10 NOTE — Progress Notes (Signed)
Physical Therapy Treatment Patient Details Name: Diane Daugherty MRN: [id number] DOB: [birth year] Today's Date: 10/10/2021   History of Present Illness Pt is a 81 y.o. female s/p Left knee revision arthroplasty with revision of all components on 10/08/21. PMH significant for carpal tunnel syndrome, CKD, HTN, PVD, B TKA, and posterior lumbar fusion (2005).    PT Comments    Pt is POD #2 s/p Lt total knee revision resulting in the deficits listed below (see PT Problem List). Pt performed sit to stand transfers requiring MOD A from recliner and MIN A from [facility] placed over toilet. Pt ambulated ~46f and additional 812fwith MIN A. Limited with further distance due to onset of dizziness/nausea during ambulation and pt stating " I feel like I am going to pass out" (see general gait details for vitals assessed), pt not orthostatic- did have intermittent episodes of O2 desats, recovered to 90-93% with cues for deep breathing and seated rest breaks. Pt reported symptoms improved at end of session, but not completely resolved. Pt will benefit from skilled PT to maximize functional mobility to increase independence.     Recommendations for follow up therapy are one component of a multi-disciplinary discharge planning process, led by the attending physician.  Recommendations may be updated based on patient status, additional functional criteria and insurance authorization.  Follow Up Recommendations  Skilled nursing-short term rehab (<3 hours/day) Can patient physically be transported by private vehicle: Yes   Assistance Recommended at Discharge Frequent or constant Supervision/Assistance  Patient can return home with the following A little help with walking and/or transfers;A little help with bathing/dressing/bathroom;Help with stairs or ramp for entrance;Assist for transportation;Assistance with cooking/housework   Equipment Recommendations  None recommended by PT (pt reports she owns RW)    Recommendations  for Other Services       Precautions / Restrictions Precautions Precautions: Knee;Fall Restrictions Weight Bearing Restrictions: No Other Position/Activity Restrictions: WBAT     Mobility  Bed Mobility               General bed mobility comments: Pt received in recliner, in recliner at end of session    Transfers Overall transfer level: Needs assistance Equipment used: Rolling walker (2 wheels) Transfers: Sit to/from Stand Sit to Stand: Mod assist           General transfer comment: x2 attempts for successful upright stand. MOD A required today with cues for hand placement, steadying in standing and cues for sequencing of transitioning hands to RW safely. [facility] with use of grab bars.    Ambulation/Gait Ambulation/Gait assistance: Min assist Gait Distance (Feet): 14 Feet Assistive device: Rolling walker (2 wheels) Gait Pattern/deviations: Step-to pattern, Decreased stride length, Decreased weight shift to left, Trunk flexed, Narrow base of support Gait velocity: decreased     General Gait Details: vitals at start of session 131/57, 103bpm, 85% on RA (improved to 92% with cues for deep breathing technique).1463from recliner to bathroom, vitals assessed due to reports of mild dizziness 144/68, 105bpm, 93%. Pt able to ambulate additional 8ft45fter toileting - onset of dizziness/nausea and stating " I feel like I am going to pass out" daughter present and bringing recliner chair closer to provide seated rest break. Vitals 131/56, 108bpm, 88%, improved to 91% with cues for deep breathing. increased pain with ambulation.   Stairs             Wheelchair Mobility    Modified Rankin (Stroke  Patients Only)       Balance                                            Cognition Arousal/Alertness: Awake/alert Behavior During Therapy: WFL for tasks assessed/performed Overall Cognitive Status: Within Functional  Limits for tasks assessed                                          Exercises Total Joint Exercises Ankle Circles/Pumps: AROM, Both, 20 reps, Seated Quad Sets: AROM, Both, Seated, 10 reps    General Comments        Pertinent Vitals/Pain Pain Assessment Pain Assessment: 0-10 Pain Score: 7  Pain Location: Lt knee Pain Descriptors / Indicators: Grimacing, Guarding, Sore, Discomfort Pain Intervention(s): Monitored during session, Limited activity within patient's tolerance, Repositioned    Home Living Family/patient expects to be discharged to:: Private residence Living Arrangements: Children Available Help at Discharge: Family;Available PRN/intermittently Type of Home: House Home Access: Stairs to enter Entrance Stairs-Rails: None Entrance Stairs-Number of Steps: 1 Alternate Level Stairs-Number of Steps: 17 Home Layout: Two level;1/2 bath on main level Home Equipment: Conservation officer, nature (2 wheels);Cane - quad;Cane - single point;BSC/3in1 Additional Comments: Pt reports 1 step to enter but 17 steps to get up to bedroom/full bath. States she has nowhere to sleep on main level.    Prior Function            PT Goals (current goals can now be found in the care plan section) Acute Rehab PT Goals Patient Stated Goal: Go to rehab to improve independence PT Goal Formulation: With patient/family Time For Goal Achievement: 10/23/21 Potential to Achieve Goals: Good Progress towards PT goals: Progressing toward goals    Frequency    7X/week      PT Plan Current plan remains appropriate    Co-evaluation              AM-PAC PT "6 Clicks" Mobility   Outcome Measure  Help needed turning from your back to your side while in a flat bed without using bedrails?: A Little Help needed moving from lying on your back to sitting on the side of a flat bed without using bedrails?: A Little Help needed moving to and from a bed to a chair (including a wheelchair)?: A  Little Help needed standing up from a chair using your arms (e.g., wheelchair or bedside chair)?: A Little Help needed to walk in hospital room?: A Little Help needed climbing 3-5 steps with a railing? : A Lot 6 Click Score: 17    End of Session Equipment Utilized During Treatment: Gait belt Activity Tolerance: Patient tolerated treatment well;Patient limited by pain Patient left: in chair;with call bell/phone within reach;with chair alarm set;with family/visitor present Nurse Communication: Mobility status (pt symptoms and vitals during session) PT Visit Diagnosis: Muscle weakness (generalized) (M62.81);Difficulty in walking, not elsewhere classified (R26.2);Pain Pain - Right/Left: Left Pain - part of body: Knee     Time: 1117-1204 PT Time Calculation (min) (ACUTE ONLY): 47 min  Charges:  $Therapeutic Activity: 38-52 mins                     Festus Barren PT, DPT  Acute Rehabilitation Services  Office [phone]  10/10/2021, 12:15 PM

## 2021-10-10 NOTE — TOC Progression Note (Addendum)
Transition of Care [facility]) - Progression Note    Patient Details  Name: Diane Daugherty MRN: [id number] Date of Birth: Jun 09, 1940  Transition of Care [facility]) CM/SW Contact  Ross Ludwig, [place] Phone Number: 10/10/2021, 5:29 PM  Clinical Narrative:     CSW spoke to patient and provided bed offers.  She would like to go to [facility].  CSW contacted [place] to confirm bed availability and they can accept patient tomorrow if she is medically ready for discharge.  CSW updated patient's daughter Epimenio Foot [phone], and she is aware of patient's choice.  CSW to continue to follow patient's progress throughout discharge planning.   Expected Discharge Plan: Skilled Nursing Facility Barriers to Discharge: Continued Medical Work up  Expected Discharge Plan and Services Expected Discharge Plan: [place]                                               Social Determinants of Health (SDOH) Interventions    Readmission Risk Interventions     No data to display

## 2021-10-11 DIAGNOSIS — Z96652 Presence of left artificial knee joint: Secondary | ICD-10-CM | POA: Diagnosis not present

## 2021-10-11 DIAGNOSIS — G4733 Obstructive sleep apnea (adult) (pediatric): Secondary | ICD-10-CM | POA: Diagnosis not present

## 2021-10-11 DIAGNOSIS — R41841 Cognitive communication deficit: Secondary | ICD-10-CM | POA: Diagnosis not present

## 2021-10-11 DIAGNOSIS — T84018D Broken internal joint prosthesis, other site, subsequent encounter: Secondary | ICD-10-CM | POA: Diagnosis not present

## 2021-10-11 DIAGNOSIS — Z471 Aftercare following joint replacement surgery: Secondary | ICD-10-CM | POA: Diagnosis not present

## 2021-10-11 DIAGNOSIS — I129 Hypertensive chronic kidney disease with stage 1 through stage 4 chronic kidney disease, or unspecified chronic kidney disease: Secondary | ICD-10-CM | POA: Diagnosis not present

## 2021-10-11 DIAGNOSIS — Z96659 Presence of unspecified artificial knee joint: Secondary | ICD-10-CM | POA: Diagnosis not present

## 2021-10-11 DIAGNOSIS — R2681 Unsteadiness on feet: Secondary | ICD-10-CM | POA: Diagnosis not present

## 2021-10-11 DIAGNOSIS — D472 Monoclonal gammopathy: Secondary | ICD-10-CM | POA: Diagnosis not present

## 2021-10-11 DIAGNOSIS — M5136 Other intervertebral disc degeneration, lumbar region: Secondary | ICD-10-CM | POA: Diagnosis not present

## 2021-10-11 DIAGNOSIS — T8484XD Pain due to internal orthopedic prosthetic devices, implants and grafts, subsequent encounter: Secondary | ICD-10-CM | POA: Diagnosis not present

## 2021-10-11 DIAGNOSIS — R293 Abnormal posture: Secondary | ICD-10-CM | POA: Diagnosis not present

## 2021-10-11 DIAGNOSIS — N1831 Chronic kidney disease, stage 3a: Secondary | ICD-10-CM | POA: Diagnosis not present

## 2021-10-11 DIAGNOSIS — R262 Difficulty in walking, not elsewhere classified: Secondary | ICD-10-CM | POA: Diagnosis not present

## 2021-10-11 DIAGNOSIS — M1990 Unspecified osteoarthritis, unspecified site: Secondary | ICD-10-CM | POA: Diagnosis not present

## 2021-10-11 DIAGNOSIS — J45909 Unspecified asthma, uncomplicated: Secondary | ICD-10-CM | POA: Diagnosis not present

## 2021-10-11 DIAGNOSIS — D649 Anemia, unspecified: Secondary | ICD-10-CM | POA: Diagnosis not present

## 2021-10-11 DIAGNOSIS — N183 Chronic kidney disease, stage 3 unspecified: Secondary | ICD-10-CM | POA: Diagnosis not present

## 2021-10-11 DIAGNOSIS — I739 Peripheral vascular disease, unspecified: Secondary | ICD-10-CM | POA: Diagnosis not present

## 2021-10-11 DIAGNOSIS — M6281 Muscle weakness (generalized): Secondary | ICD-10-CM | POA: Diagnosis not present

## 2021-10-11 DIAGNOSIS — M130 Polyarthritis, unspecified: Secondary | ICD-10-CM | POA: Diagnosis not present

## 2021-10-11 DIAGNOSIS — C9000 Multiple myeloma not having achieved remission: Secondary | ICD-10-CM | POA: Diagnosis not present

## 2021-10-11 DIAGNOSIS — J449 Chronic obstructive pulmonary disease, unspecified: Secondary | ICD-10-CM | POA: Diagnosis not present

## 2021-10-11 DIAGNOSIS — R1311 Dysphagia, oral phase: Secondary | ICD-10-CM | POA: Diagnosis not present

## 2021-10-11 MED ORDER — BACLOFEN 10 MG PO TABS: 10.0000 mg | ORAL_TABLET | Freq: Three times a day (TID) | ORAL | 0 refills | Status: DC | PRN

## 2021-10-11 MED ORDER — HYDROMORPHONE HCL 2 MG PO TABS: 2.0000 mg | ORAL_TABLET | Freq: Four times a day (QID) | ORAL | 0 refills | Status: DC | PRN

## 2021-10-11 MED ORDER — APIXABAN 2.5 MG PO TABS: 2.5000 mg | ORAL_TABLET | Freq: Two times a day (BID) | ORAL | 0 refills | Status: DC

## 2021-10-11 NOTE — Progress Notes (Signed)
Report called to [facility], reoirted to [place] D Bethanie Dicker

## 2021-10-11 NOTE — Progress Notes (Signed)
Physical Therapy Treatment Patient Details Name: Diane Daugherty MRN: [id number] DOB: Jul 14, 1940 Today's Date: 10/11/2021   History of Present Illness Pt is a 81 y.o. female s/p Left knee revision arthroplasty with revision of all components on 10/08/21. PMH significant for carpal tunnel syndrome, CKD, HTN, PVD, B TKA, and posterior lumbar fusion (2005).    PT Comments    Pt feeling much better, decr assist overall. Pt resting SpO2=85-87% on RA, with activity 95-97% on RA. Checked again at rest in supine and SpO2=84%, replaced at 2L with pt in 90s .  Recommendations for follow up therapy are one component of a multi-disciplinary discharge planning process, led by the attending physician.  Recommendations may be updated based on patient status, additional functional criteria and insurance authorization.  Follow Up Recommendations  Skilled nursing-short term rehab (<3 hours/day) Can patient physically be transported by private vehicle: Yes   Assistance Recommended at Discharge Frequent or constant Supervision/Assistance  Patient can return home with the following A little help with walking and/or transfers;A little help with bathing/dressing/bathroom;Help with stairs or ramp for entrance;Assist for transportation;Assistance with cooking/housework   Equipment Recommendations  None recommended by PT    Recommendations for Other Services       Precautions / Restrictions Precautions Precautions: Knee;Fall Restrictions Weight Bearing Restrictions: No Other Position/Activity Restrictions: WBAT     Mobility  Bed Mobility Overal bed mobility: Needs Assistance Bed Mobility: Sit to Supine       Sit to supine: Min guard   General bed mobility comments: able to use gait belt to self assist LLE on to bed    Transfers Overall transfer level: Needs assistance Equipment used: Rolling walker (2 wheels) Transfers: Sit to/from Stand Sit to Stand: Min assist, Min guard            General transfer comment: cues for hand placement and to power up with RLE    Ambulation/Gait Ambulation/Gait assistance: Min guard Gait Distance (Feet): 16 Feet (x2) Assistive device: Rolling walker (2 wheels) Gait Pattern/deviations: Step-to pattern, Step-through pattern, Decreased stance time - left       General Gait Details: cues for sequence initially, min/guard for safety   Stairs             Wheelchair Mobility    Modified Rankin (Stroke Patients Only)       Balance                                            Cognition Arousal/Alertness: Awake/alert Behavior During Therapy: WFL for tasks assessed/performed Overall Cognitive Status: Within Functional Limits for tasks assessed                                          Exercises Other Exercises Other Exercises: pursed ip brathing, reviewed importance of KI    General Comments        Pertinent Vitals/Pain Pain Assessment Pain Assessment: 0-10 Pain Score: 4  Pain Location: Lt knee Pain Descriptors / Indicators: Grimacing, Guarding, Sore, Discomfort Pain Intervention(s): Limited activity within patient's tolerance, Monitored during session, Premedicated before session, Repositioned, Ice applied    Home Living  Prior Function            PT Goals (current goals can now be found in the care plan section) Acute Rehab PT Goals Patient Stated Goal: Go to rehab to improve independence PT Goal Formulation: With patient/family Time For Goal Achievement: 10/23/21 Potential to Achieve Goals: Good Progress towards PT goals: Progressing toward goals    Frequency    7X/week      PT Plan Current plan remains appropriate    Co-evaluation              AM-PAC PT "6 Clicks" Mobility   Outcome Measure  Help needed turning from your back to your side while in a flat bed without using bedrails?: A Little Help needed moving  from lying on your back to sitting on the side of a flat bed without using bedrails?: A Little Help needed moving to and from a bed to a chair (including a wheelchair)?: A Little Help needed standing up from a chair using your arms (e.g., wheelchair or bedside chair)?: A Little Help needed to walk in hospital room?: A Little Help needed climbing 3-5 steps with a railing? : A Little 6 Click Score: 18    End of Session Equipment Utilized During Treatment: Gait belt Activity Tolerance: Patient tolerated treatment well Patient left: with call bell/phone within reach;in bed   PT Visit Diagnosis: Muscle weakness (generalized) (M62.81);Difficulty in walking, not elsewhere classified (R26.2);Pain Pain - Right/Left: Left Pain - part of body: Knee     Time: 1022-1054 PT Time Calculation (min) (ACUTE ONLY): 32 min  Charges:  $Gait Training: 8-22 mins $Therapeutic Activity: 8-22 mins                     Baxter Flattery, PT  Acute Rehab Dept [facility]) [phone]  [facility] Weekend Pager [facility] only)  [phone]  10/11/2021    [facility] 10/11/2021, 10:58 AM

## 2021-10-11 NOTE — Care Management Important Message (Signed)
Important Message  Patient Details IM Letter given to the Patient. Name: Diane Daugherty MRN: [id number] Date of Birth: 05-21-1940   Medicare Important Message Given:  Yes     Kerin Salen 10/11/2021, 1:15 PM

## 2021-10-11 NOTE — TOC Transition Note (Signed)
Transition of Care [facility]) - CM/SW Discharge Note   Patient Details  Name: Diane Daugherty MRN: [id number] Date of Birth: 01-20-1941  [facility]) CM/SW Contact:  Lennart Pall, LCSW Phone Number: 10/11/2021, 12:56 PM   Clinical Narrative:    Pt is medically cleared for dc today to SNF and has accepted bed with [facility].  Pt will be transported by family/ private vehicle.  RN to call report to [phone].     Final next level of care: Skilled Nursing Facility Barriers to Discharge: Barriers Resolved   Patient Goals and CMS Choice Patient states their goals for this hospitalization and ongoing recovery are:: To return back home after short term rehab. CMS Medicare.gov Compare Post Acute Care list provided to:: Patient Represenative (must comment) Choice offered to / list presented to : Adult Children  Discharge Placement              Patient chooses bed at: [facility] Patient to be transferred to facility by: daughter Name of family member notified: daughter Patient and family notified of of transfer: 10/11/21  Discharge Plan and Services                                     Social Determinants of Health (SDOH) Interventions     Readmission Risk Interventions     No data to display

## 2021-10-11 NOTE — Progress Notes (Signed)
Subjective: 3 Days Post-Op Procedure(s) (LRB): LEFT TOTAL KNEE REVISION (Left) Patient reports pain as moderate.    Objective: Vital signs in last 24 hours: Temp:  [98.8 F (37.1 C)-100.8 F (38.2 C)] 98.8 F (37.1 C) (09/18 0650) Pulse Rate:  [88-102] 88 (09/18 0650) Resp:  [16] 16 (09/18 0650) BP: (100-136)/(41-54) 116/43 (09/18 0650) SpO2:  [95 %-99 %] 95 % (09/18 0650)  Intake/Output from previous day: 09/17 0701 - 09/18 0700 In: 1366.9 [P.O.:1200; I.V.:166.9] Out: -  Intake/Output this shift: No intake/output data recorded.  Recent Labs    10/09/21 0315  HGB 9.9*   Recent Labs    10/09/21 0315  WBC 7.3  RBC 3.01*  HCT 29.1*  PLT 150   Recent Labs    10/09/21 0315  NA 139  K 4.8  CL 105  CO2 29  BUN 19  CREATININE 1.12*  GLUCOSE 136*  CALCIUM 8.9   No results for input(s): "LABPT", "INR" in the last 72 hours.  Sensation intact distally Intact pulses distally Dorsiflexion/Plantar flexion intact Incision: scant drainage Compartment soft   Assessment/Plan: 3 Days Post-Op Procedure(s) (LRB): LEFT TOTAL KNEE REVISION (Left)  Can discharge to skilled nursing today.     Mcarthur Rossetti 10/11/2021, 7:33 AM

## 2021-10-11 NOTE — Discharge Summary (Signed)
Patient ID: Diane Daugherty MRN: [id number] DOB/AGE: 05/15/1940 81 y.o.  Admit date: 10/08/2021 Discharge date: 10/11/2021  Admission Diagnoses:  Principal Problem:   Failed total left knee replacement [facility]) Active Problems:   Status post revision of total replacement of left knee   Discharge Diagnoses:  Same  Past Medical History:  Diagnosis Date   Anemia 09/24/1968   "after childbirth"   Arthritis    "feels like all over"   Carpal tunnel syndrome    Carpal tunnel syndrome    left   Cataract    immature on right   Chronic bronchitis ([place])    "get it most years" (10/08/2014)   Chronic kidney disease    stage III, per Pt   Family history of anesthesia complication    daughter gets sick   History of blood transfusion    "w/one of my back OR's"   Hypertension    takes Hyzaar daily   Joint pain    "all over"   Joint swelling    Muscle spasms of lower extremity    takes Baclofen daily prn   Nocturia    OSA on CPAP    Peripheral vascular disease (HCC)    PFO (patent foramen ovale)    positive saline bubble study 05/12/09 ([place])   Pneumonia 2010/ 2011   PONV (postoperative nausea and vomiting)    woke up during hysterectomy   Shortness of breath    with exertion;Albuterol prn   Urinary frequency     Surgeries: Procedure(s): LEFT TOTAL KNEE REVISION on 10/08/2021   Consultants:   Discharged Condition: Improved  Hospital Course: Diane Daugherty is an 81 y.o. female who was admitted 10/08/2021 for operative treatment ofFailed total left knee replacement ([place]). Patient has severe unremitting pain that affects sleep, daily activities, and work/hobbies. After pre-op clearance the patient was taken to the operating room on 10/08/2021 and underwent  Procedure(s): LEFT TOTAL KNEE REVISION.    Patient was given perioperative antibiotics:  Anti-infectives (From admission, onward)    Start     Dose/Rate Route Frequency Ordered Stop   10/08/21 2000  ceFAZolin (ANCEF) IVPB  1 g/50 mL premix        1 g 100 mL/hr over 30 Minutes Intravenous Every 6 hours 10/08/21 1849 10/09/21 0205   10/08/21 1045  ceFAZolin (ANCEF) IVPB 2g/100 mL premix        2 g 200 mL/hr over 30 Minutes Intravenous On call to O.R. 10/08/21 1030 10/08/21 1327        Patient was given sequential compression devices, early ambulation, and chemoprophylaxis to prevent DVT.  Patient benefited maximally from hospital stay and there were no complications.    Recent vital signs: Patient Vitals for the past 24 hrs:  BP Temp Temp src Pulse Resp SpO2  10/11/21 0650 (!) 116/43 98.8 F (37.1 C) Oral 88 16 95 %  10/11/21 0600 (!) 100/41 (!) 100.8 F (38.2 C) Oral 93 16 95 %  10/11/21 0400 -- 99.5 F (37.5 C) Axillary -- -- --  10/10/21 1938 (!) 135/49 98.8 F (37.1 C) Oral 100 16 97 %  10/10/21 1355 (!) 126/50 98.9 F (37.2 C) -- 90 16 99 %  10/10/21 1020 (!) 136/54 -- -- (!) 102 -- --     Recent laboratory studies:  Recent Labs    10/09/21 0315  WBC 7.3  HGB 9.9*  HCT 29.1*  PLT 150  NA 139  K 4.8  CL 105  CO2 29  BUN 19  CREATININE 1.12*  GLUCOSE 136*  CALCIUM 8.9     Discharge Medications:   Allergies as of 10/11/2021       Reactions   Levaquin [levofloxacin] Swelling   Morphine And Related Nausea Only   Other Nausea And Vomiting   Darvocet-vomiting   Oxycodone Nausea Only   Naprosyn [naproxen] Itching, Rash        Medication List     TAKE these medications    acetaminophen 650 MG CR tablet Commonly known as: TYLENOL Take 650-1,300 mg by mouth every 8 (eight) hours as needed for pain.   amLODipine 5 MG tablet Commonly known as: NORVASC Take 1 tablet (5 mg total) by mouth daily.   apixaban 2.5 MG Tabs tablet Commonly known as: ELIQUIS Take 1 tablet (2.5 mg total) by mouth every 12 (twelve) hours.   baclofen 10 MG tablet Commonly known as: LIORESAL Take 1 tablet (10 mg total) by mouth 3 (three) times daily as needed for muscle spasms.   CALCIUM  CITRATE + D PO Take 1 tablet by mouth daily.   CENTRUM SILVER 50+WOMEN PO Take 1 tablet by mouth daily.   CHELATED IRON PO Take 27 mg by mouth daily.   fluticasone 50 MCG/ACT nasal spray Commonly known as: FLONASE Place 2 sprays into both nostrils daily as needed for allergies or rhinitis.   HYDROmorphone 2 MG tablet Commonly known as: DILAUDID Take 1 tablet (2 mg total) by mouth every 6 (six) hours as needed for severe pain or moderate pain.   Krill Oil 300 MG Caps Take 300 mg by mouth daily.   valsartan 160 MG tablet Commonly known as: DIOVAN Take 1 tablet (160 mg total) by mouth daily. What changed: when to take this               Durable Medical Equipment  (From admission, onward)           Start     Ordered   10/08/21 1850  DME 3 n 1  Once        10/08/21 1849   10/08/21 1850  DME Walker rolling  Once       Question Answer Comment  Walker: With 5 Inch Wheels   Patient needs a walker to treat with the following condition Status post revision of total replacement of left knee      10/08/21 1849            Diagnostic Studies: DG Knee Left Port  Result Date: 10/08/2021 CLINICAL DATA:  Status post revision of total left knee arthroplasty. EXAM: PORTABLE LEFT KNEE - 1-2 VIEW COMPARISON:  Left knee radiographs 03/23/2021 FINDINGS: There is revision of the prior total left knee arthroplasty, now with longer femoral and tibial stem components. No perihardware lucency is seen to indicate hardware failure or loosening. Expected postoperative changes including mild intra-articular fluid and air and mild anterior soft tissue swelling. There are anterior surgical skin staples. No acute fracture or dislocation. IMPRESSION: Interval revision of total left knee arthroplasty without evidence of hardware failure. Electronically Signed   By: Yvonne Kendall M.D.   On: 10/08/2021 16:22    Disposition: Discharge disposition: 03-Skilled [place] information for follow-up providers     Mcarthur Rossetti, MD Follow up in 2 week(s).   Specialty: Orthopedic Surgery Contact information: [address] [place] [address] [phone]  Contact information for after-discharge care     Destination     [facility] Preferred SNF .   Service: Skilled Nursing Contact information: [place] [phone]                      Signed: Mcarthur Rossetti 10/11/2021, 7:36 AM

## 2021-10-12 ENCOUNTER — Encounter (HOSPITAL_COMMUNITY): Payer: Self-pay | Admitting: Orthopaedic Surgery

## 2021-10-12 DIAGNOSIS — C9000 Multiple myeloma not having achieved remission: Secondary | ICD-10-CM | POA: Diagnosis not present

## 2021-10-12 DIAGNOSIS — G4733 Obstructive sleep apnea (adult) (pediatric): Secondary | ICD-10-CM | POA: Diagnosis not present

## 2021-10-12 DIAGNOSIS — M130 Polyarthritis, unspecified: Secondary | ICD-10-CM | POA: Diagnosis not present

## 2021-10-12 DIAGNOSIS — Z96659 Presence of unspecified artificial knee joint: Secondary | ICD-10-CM | POA: Diagnosis not present

## 2021-10-12 DIAGNOSIS — I739 Peripheral vascular disease, unspecified: Secondary | ICD-10-CM | POA: Diagnosis not present

## 2021-10-12 DIAGNOSIS — R262 Difficulty in walking, not elsewhere classified: Secondary | ICD-10-CM | POA: Diagnosis not present

## 2021-10-12 DIAGNOSIS — D649 Anemia, unspecified: Secondary | ICD-10-CM | POA: Diagnosis not present

## 2021-10-12 DIAGNOSIS — J449 Chronic obstructive pulmonary disease, unspecified: Secondary | ICD-10-CM | POA: Diagnosis not present

## 2021-10-12 DIAGNOSIS — M5136 Other intervertebral disc degeneration, lumbar region: Secondary | ICD-10-CM | POA: Diagnosis not present

## 2021-10-12 DIAGNOSIS — N1831 Chronic kidney disease, stage 3a: Secondary | ICD-10-CM | POA: Diagnosis not present

## 2021-10-12 DIAGNOSIS — T84018D Broken internal joint prosthesis, other site, subsequent encounter: Secondary | ICD-10-CM | POA: Diagnosis not present

## 2021-10-13 DIAGNOSIS — J45909 Unspecified asthma, uncomplicated: Secondary | ICD-10-CM | POA: Diagnosis not present

## 2021-10-13 DIAGNOSIS — T8484XD Pain due to internal orthopedic prosthetic devices, implants and grafts, subsequent encounter: Secondary | ICD-10-CM | POA: Diagnosis not present

## 2021-10-13 DIAGNOSIS — M6281 Muscle weakness (generalized): Secondary | ICD-10-CM | POA: Diagnosis not present

## 2021-10-13 DIAGNOSIS — R2681 Unsteadiness on feet: Secondary | ICD-10-CM | POA: Diagnosis not present

## 2021-10-13 DIAGNOSIS — M1990 Unspecified osteoarthritis, unspecified site: Secondary | ICD-10-CM | POA: Diagnosis not present

## 2021-10-14 ENCOUNTER — Telehealth: Payer: Self-pay

## 2021-10-14 ENCOUNTER — Other Ambulatory Visit: Payer: Self-pay | Admitting: *Deleted

## 2021-10-14 DIAGNOSIS — I129 Hypertensive chronic kidney disease with stage 1 through stage 4 chronic kidney disease, or unspecified chronic kidney disease: Secondary | ICD-10-CM | POA: Diagnosis not present

## 2021-10-14 DIAGNOSIS — Z471 Aftercare following joint replacement surgery: Secondary | ICD-10-CM | POA: Diagnosis not present

## 2021-10-14 DIAGNOSIS — N183 Chronic kidney disease, stage 3 unspecified: Secondary | ICD-10-CM | POA: Diagnosis not present

## 2021-10-14 DIAGNOSIS — Z96659 Presence of unspecified artificial knee joint: Secondary | ICD-10-CM | POA: Diagnosis not present

## 2021-10-14 NOTE — Telephone Encounter (Signed)
I called and advised swelling is normal and to be expected. She wasn't concerned of DVT. Stated no calf pain or tenderness. Advised to remove bandage and do dry dressings.I advised for her to do elevation and ice and to cb if any concern for DVT or infection. She stated understanding

## 2021-10-14 NOTE — Patient Outreach (Signed)
Ms. Diane Daugherty currently resides in [facility]. Screening for potential [facility] care coordination/care management services as a benefit of insurance plan and PCP.  Facility site visit to [facility]. Met with SNF social workers and Civil engineer, contracting. Goal is to return home. Lived with daughter prior. Mrs Diane Daugherty was independent prior and still drove. Daughter works during the day.   Spoke with Mrs. Diane Daugherty at bedside. Confirms she plans to return home upon SNF discharge. Discussed [facility] care coordination/care management services. Provided [facility] literature and writer's contact information.  Will continue to follow.   Marthenia Rolling, MSN, RN,BSN [place] Acute Care Coordinator [phone] (Direct dial)

## 2021-10-14 NOTE — Telephone Encounter (Signed)
Diane Daugherty at [facility] and [facility] like a call back concerning patient's left knee.  Stated that left knee is swollen and has some warmth to it and that patient stated that she may be allergic to the Aquacel bandage.  Patient has been using ice to help with the swelling.  Patient had left knee surgery on 10/08/2021.  Cb# [phone].  Please advise.

## 2021-10-19 DIAGNOSIS — M6281 Muscle weakness (generalized): Secondary | ICD-10-CM | POA: Diagnosis not present

## 2021-10-19 DIAGNOSIS — J45909 Unspecified asthma, uncomplicated: Secondary | ICD-10-CM | POA: Diagnosis not present

## 2021-10-19 DIAGNOSIS — T8484XD Pain due to internal orthopedic prosthetic devices, implants and grafts, subsequent encounter: Secondary | ICD-10-CM | POA: Diagnosis not present

## 2021-10-19 DIAGNOSIS — R2681 Unsteadiness on feet: Secondary | ICD-10-CM | POA: Diagnosis not present

## 2021-10-19 DIAGNOSIS — M1990 Unspecified osteoarthritis, unspecified site: Secondary | ICD-10-CM | POA: Diagnosis not present

## 2021-10-21 ENCOUNTER — Ambulatory Visit (INDEPENDENT_AMBULATORY_CARE_PROVIDER_SITE_OTHER): Payer: Medicare Other | Admitting: Orthopaedic Surgery

## 2021-10-21 ENCOUNTER — Other Ambulatory Visit: Payer: Self-pay | Admitting: *Deleted

## 2021-10-21 DIAGNOSIS — Z96652 Presence of left artificial knee joint: Secondary | ICD-10-CM

## 2021-10-21 DIAGNOSIS — M6281 Muscle weakness (generalized): Secondary | ICD-10-CM | POA: Diagnosis not present

## 2021-10-21 DIAGNOSIS — T8484XD Pain due to internal orthopedic prosthetic devices, implants and grafts, subsequent encounter: Secondary | ICD-10-CM | POA: Diagnosis not present

## 2021-10-21 DIAGNOSIS — M1990 Unspecified osteoarthritis, unspecified site: Secondary | ICD-10-CM | POA: Diagnosis not present

## 2021-10-21 DIAGNOSIS — R2681 Unsteadiness on feet: Secondary | ICD-10-CM | POA: Diagnosis not present

## 2021-10-21 DIAGNOSIS — J45909 Unspecified asthma, uncomplicated: Secondary | ICD-10-CM | POA: Diagnosis not present

## 2021-10-21 NOTE — Patient Outreach (Signed)
[facility] Post- Acute Care Coordinator follow up. Ms. Kloc resides in [facility].   Update received from [place], [place] indicating Ms. Mathison's goal is to return home. She was independent prior to admission. Will need more therapy to achieve goals prior to returning home.   Will continue to follow for potential [facility] care coordination services as benefit of insurance plan and PCP.   Marthenia Rolling, MSN, RN,BSN [place] Acute Care Coordinator [phone] (Direct dial)

## 2021-10-21 NOTE — Progress Notes (Signed)
The patient is here today for first postoperative visit status post a left total knee revision arthroplasty.  She had had an original knee that was done by one of my colleagues that eventually loosened.  This was aseptic loosening.  We found no evidence of infection.  We were able to revise all components.  She has been staying in a skilled nursing facility.  She is doing well.  She has been on Eliquis that we started her on due to her limited mobility and allergy to aspirin.  She has been wearing TED hose as well.  Her left knee incision looks good.  I remove the staples and place Steri-Strips.  We can stop her Eliquis but I want her to continue to wear the TED hose.  I did provide notes for the skilled nursing facility to keep working on her range of motion of that left knee as well as strengthening with balance and coordination.  She should transition to home soon and then eventually need to get her into outpatient physical therapy.  We will see her back in 4 weeks to see how she is doing overall but no x-rays are needed.  All questions and concerns were answered and addressed.

## 2021-10-26 DIAGNOSIS — J45909 Unspecified asthma, uncomplicated: Secondary | ICD-10-CM | POA: Diagnosis not present

## 2021-10-26 DIAGNOSIS — D649 Anemia, unspecified: Secondary | ICD-10-CM | POA: Diagnosis not present

## 2021-10-26 DIAGNOSIS — M1990 Unspecified osteoarthritis, unspecified site: Secondary | ICD-10-CM | POA: Diagnosis not present

## 2021-10-26 DIAGNOSIS — R2681 Unsteadiness on feet: Secondary | ICD-10-CM | POA: Diagnosis not present

## 2021-10-26 DIAGNOSIS — C9000 Multiple myeloma not having achieved remission: Secondary | ICD-10-CM | POA: Diagnosis not present

## 2021-10-26 DIAGNOSIS — M6281 Muscle weakness (generalized): Secondary | ICD-10-CM | POA: Diagnosis not present

## 2021-10-26 DIAGNOSIS — T8484XD Pain due to internal orthopedic prosthetic devices, implants and grafts, subsequent encounter: Secondary | ICD-10-CM | POA: Diagnosis not present

## 2021-10-28 ENCOUNTER — Other Ambulatory Visit: Payer: Self-pay | Admitting: *Deleted

## 2021-10-28 NOTE — Patient Outreach (Signed)
THN Post- Acute Care Coordinator follow up. Diane Daugherty resides in [facility]. Screening for potential [facility] care coordination services as benefit of insurance plan and PCP.   Met with therapy manager and SNF social workers at [facility]. Diane Daugherty is "under the weather" currently. Transition plan remains to return home with daughter.   Will continue to follow.    Marthenia Rolling, MSN, RN,BSN [place] Acute Care Coordinator [phone] (Direct dial)

## 2021-10-29 ENCOUNTER — Other Ambulatory Visit: Payer: Self-pay | Admitting: Hematology and Oncology

## 2021-10-29 ENCOUNTER — Inpatient Hospital Stay: Payer: No Typology Code available for payment source | Attending: Hematology and Oncology

## 2021-10-29 DIAGNOSIS — D472 Monoclonal gammopathy: Secondary | ICD-10-CM | POA: Insufficient documentation

## 2021-10-29 DIAGNOSIS — J45909 Unspecified asthma, uncomplicated: Secondary | ICD-10-CM | POA: Diagnosis not present

## 2021-10-29 DIAGNOSIS — M6281 Muscle weakness (generalized): Secondary | ICD-10-CM | POA: Diagnosis not present

## 2021-10-29 DIAGNOSIS — M1990 Unspecified osteoarthritis, unspecified site: Secondary | ICD-10-CM | POA: Diagnosis not present

## 2021-10-29 DIAGNOSIS — T8484XD Pain due to internal orthopedic prosthetic devices, implants and grafts, subsequent encounter: Secondary | ICD-10-CM | POA: Diagnosis not present

## 2021-10-29 DIAGNOSIS — R2681 Unsteadiness on feet: Secondary | ICD-10-CM | POA: Diagnosis not present

## 2021-10-29 LAB — CMP (CANCER CENTER ONLY)
ALT: 11 U/L (ref 0–44)
AST: 15 U/L (ref 15–41)
Albumin: 3.8 g/dL (ref 3.5–5.0)
Alkaline Phosphatase: 91 U/L (ref 38–126)
Anion gap: 5 (ref 5–15)
BUN: 20 mg/dL (ref 8–23)
CO2: 32 mmol/L (ref 22–32)
Calcium: 10.1 mg/dL (ref 8.9–10.3)
Chloride: 100 mmol/L (ref 98–111)
Creatinine: 1.15 mg/dL — ABNORMAL HIGH (ref 0.44–1.00)
GFR, Estimated: 48 mL/min — ABNORMAL LOW (ref >60)
Glucose, Bld: 129 mg/dL — ABNORMAL HIGH (ref 70–99)
Potassium: 4 mmol/L (ref 3.5–5.1)
Sodium: 137 mmol/L (ref 135–145)
Total Bilirubin: 0.5 mg/dL (ref 0.3–1.2)
Total Protein: 8.3 g/dL — ABNORMAL HIGH (ref 6.5–8.1)

## 2021-10-29 LAB — CBC WITH DIFFERENTIAL (CANCER CENTER ONLY)
Abs Immature Granulocytes: 0.01 10*3/uL (ref 0.00–0.07)
Basophils Absolute: 0 10*3/uL (ref 0.0–0.1)
Basophils Relative: 1 %
Eosinophils Absolute: 0.2 10*3/uL (ref 0.0–0.5)
Eosinophils Relative: 3 %
HCT: 29.4 % — ABNORMAL LOW (ref 36.0–46.0)
Hemoglobin: 10 g/dL — ABNORMAL LOW (ref 12.0–15.0)
Immature Granulocytes: 0 %
Lymphocytes Relative: 28 %
Lymphs Abs: 1.8 10*3/uL (ref 0.7–4.0)
MCH: 31.8 pg (ref 26.0–34.0)
MCHC: 34 g/dL (ref 30.0–36.0)
MCV: 93.6 fL (ref 80.0–100.0)
Monocytes Absolute: 0.3 10*3/uL (ref 0.1–1.0)
Monocytes Relative: 5 %
Neutro Abs: 4.1 10*3/uL (ref 1.7–7.7)
Neutrophils Relative %: 63 %
Platelet Count: 393 10*3/uL (ref 150–400)
RBC: 3.14 MIL/uL — ABNORMAL LOW (ref 3.87–5.11)
RDW: 15 % (ref 11.5–15.5)
WBC Count: 6.5 10*3/uL (ref 4.0–10.5)
nRBC: 0 % (ref 0.0–0.2)

## 2021-10-29 LAB — LACTATE DEHYDROGENASE: LDH: 206 U/L — ABNORMAL HIGH (ref 98–192)

## 2021-10-30 LAB — BETA 2 MICROGLOBULIN, SERUM: Beta-2 Microglobulin: 3.1 mg/L — ABNORMAL HIGH (ref 0.6–2.4)

## 2021-11-01 DIAGNOSIS — M6281 Muscle weakness (generalized): Secondary | ICD-10-CM | POA: Diagnosis not present

## 2021-11-01 DIAGNOSIS — M1990 Unspecified osteoarthritis, unspecified site: Secondary | ICD-10-CM | POA: Diagnosis not present

## 2021-11-01 DIAGNOSIS — J45909 Unspecified asthma, uncomplicated: Secondary | ICD-10-CM | POA: Diagnosis not present

## 2021-11-01 DIAGNOSIS — R2681 Unsteadiness on feet: Secondary | ICD-10-CM | POA: Diagnosis not present

## 2021-11-01 DIAGNOSIS — T8484XD Pain due to internal orthopedic prosthetic devices, implants and grafts, subsequent encounter: Secondary | ICD-10-CM | POA: Diagnosis not present

## 2021-11-01 LAB — KAPPA/LAMBDA LIGHT CHAINS
Kappa free light chain: 96.3 mg/L — ABNORMAL HIGH (ref 3.3–19.4)
Kappa, lambda light chain ratio: 9.63 — ABNORMAL HIGH (ref 0.26–1.65)
Lambda free light chains: 10 mg/L (ref 5.7–26.3)

## 2021-11-03 DIAGNOSIS — J45909 Unspecified asthma, uncomplicated: Secondary | ICD-10-CM | POA: Diagnosis not present

## 2021-11-03 DIAGNOSIS — M6281 Muscle weakness (generalized): Secondary | ICD-10-CM | POA: Diagnosis not present

## 2021-11-03 DIAGNOSIS — M1990 Unspecified osteoarthritis, unspecified site: Secondary | ICD-10-CM | POA: Diagnosis not present

## 2021-11-03 DIAGNOSIS — T8484XD Pain due to internal orthopedic prosthetic devices, implants and grafts, subsequent encounter: Secondary | ICD-10-CM | POA: Diagnosis not present

## 2021-11-03 DIAGNOSIS — R2681 Unsteadiness on feet: Secondary | ICD-10-CM | POA: Diagnosis not present

## 2021-11-05 ENCOUNTER — Ambulatory Visit: Payer: Medicare Other | Admitting: Hematology and Oncology

## 2021-11-05 ENCOUNTER — Telehealth: Payer: Self-pay

## 2021-11-05 ENCOUNTER — Other Ambulatory Visit: Payer: Self-pay | Admitting: *Deleted

## 2021-11-05 DIAGNOSIS — D631 Anemia in chronic kidney disease: Secondary | ICD-10-CM | POA: Diagnosis not present

## 2021-11-05 DIAGNOSIS — T84033D Mechanical loosening of internal left knee prosthetic joint, subsequent encounter: Secondary | ICD-10-CM | POA: Diagnosis not present

## 2021-11-05 DIAGNOSIS — Z9181 History of falling: Secondary | ICD-10-CM | POA: Diagnosis not present

## 2021-11-05 DIAGNOSIS — I10 Essential (primary) hypertension: Secondary | ICD-10-CM

## 2021-11-05 DIAGNOSIS — D472 Monoclonal gammopathy: Secondary | ICD-10-CM | POA: Diagnosis not present

## 2021-11-05 DIAGNOSIS — G4733 Obstructive sleep apnea (adult) (pediatric): Secondary | ICD-10-CM | POA: Diagnosis not present

## 2021-11-05 DIAGNOSIS — I129 Hypertensive chronic kidney disease with stage 1 through stage 4 chronic kidney disease, or unspecified chronic kidney disease: Secondary | ICD-10-CM | POA: Diagnosis not present

## 2021-11-05 DIAGNOSIS — J45909 Unspecified asthma, uncomplicated: Secondary | ICD-10-CM | POA: Diagnosis not present

## 2021-11-05 DIAGNOSIS — I739 Peripheral vascular disease, unspecified: Secondary | ICD-10-CM | POA: Diagnosis not present

## 2021-11-05 DIAGNOSIS — M1711 Unilateral primary osteoarthritis, right knee: Secondary | ICD-10-CM | POA: Diagnosis not present

## 2021-11-05 DIAGNOSIS — Z6837 Body mass index (BMI) 37.0-37.9, adult: Secondary | ICD-10-CM | POA: Diagnosis not present

## 2021-11-05 DIAGNOSIS — N1831 Chronic kidney disease, stage 3a: Secondary | ICD-10-CM | POA: Diagnosis not present

## 2021-11-05 LAB — MULTIPLE MYELOMA PANEL, SERUM
Albumin SerPl Elph-Mcnc: 3.7 g/dL (ref 2.9–4.4)
Albumin/Glob SerPl: 1 (ref 0.7–1.7)
Alpha 1: 0.3 g/dL (ref 0.0–0.4)
Alpha2 Glob SerPl Elph-Mcnc: 0.8 g/dL (ref 0.4–1.0)
B-Globulin SerPl Elph-Mcnc: 1.1 g/dL (ref 0.7–1.3)
Gamma Glob SerPl Elph-Mcnc: 1.6 g/dL (ref 0.4–1.8)
Globulin, Total: 3.8 g/dL (ref 2.2–3.9)
IgA: 62 mg/dL — ABNORMAL LOW (ref 64–422)
IgG (Immunoglobin G), Serum: 2246 mg/dL — ABNORMAL HIGH (ref 586–1602)
IgM (Immunoglobulin M), Srm: 32 mg/dL (ref 26–217)
M Protein SerPl Elph-Mcnc: 0.7 g/dL — ABNORMAL HIGH
Total Protein ELP: 7.5 g/dL (ref 6.0–8.5)

## 2021-11-05 NOTE — Patient Outreach (Signed)
[place] Coordinator follow up.   Verified with [facility] SW Ms. Clary discharged from [facility] on 11/04/21 with [facility] home health.   Ms. Anagnos has medical history of recent total knee revision, PVD, HTN.  Will make [facility] care coordination referral. Spoke with Ms. Stoffel about [facility] services on 10/14/21 at [facility].    Marthenia Rolling, MSN, RN,BSN [place] Acute Care Coordinator [phone] (Direct dial)

## 2021-11-05 NOTE — Telephone Encounter (Signed)
Gave verbal ok for continued [facility] PT

## 2021-11-08 ENCOUNTER — Telehealth: Payer: Self-pay | Admitting: *Deleted

## 2021-11-08 NOTE — Chronic Care Management (AMB) (Unsigned)
  Care Coordination  Outreach Note  11/08/2021 Name: ARIEANA SOMOZA MRN: [id number] DOB: 08/19/1940   Care Coordination Outreach Attempts: An unsuccessful telephone outreach was attempted today to offer the patient information about available care coordination services as a benefit of their health plan.   Follow Up Plan:  Additional outreach attempts will be made to offer the patient care coordination information and services.   Encounter Outcome:  Pt. Request to Call [place], [place] Direct Dial: [phone]

## 2021-11-09 DIAGNOSIS — I129 Hypertensive chronic kidney disease with stage 1 through stage 4 chronic kidney disease, or unspecified chronic kidney disease: Secondary | ICD-10-CM | POA: Diagnosis not present

## 2021-11-09 DIAGNOSIS — T84033D Mechanical loosening of internal left knee prosthetic joint, subsequent encounter: Secondary | ICD-10-CM | POA: Diagnosis not present

## 2021-11-09 DIAGNOSIS — D631 Anemia in chronic kidney disease: Secondary | ICD-10-CM | POA: Diagnosis not present

## 2021-11-09 DIAGNOSIS — D472 Monoclonal gammopathy: Secondary | ICD-10-CM | POA: Diagnosis not present

## 2021-11-09 DIAGNOSIS — N1831 Chronic kidney disease, stage 3a: Secondary | ICD-10-CM | POA: Diagnosis not present

## 2021-11-09 DIAGNOSIS — M1711 Unilateral primary osteoarthritis, right knee: Secondary | ICD-10-CM | POA: Diagnosis not present

## 2021-11-09 NOTE — Chronic Care Management (AMB) (Signed)
  Care Coordination   Note   11/09/2021 Name: TYRONICA TRUXILLO MRN: [id number] DOB: [birth year]  DAMIRA KEM is a 81 y.o. year old female who sees Sagardia, Ines Bloomer, MD for primary care. I reached out to [name] by phone today to offer care coordination services.  Ms. Sydney was given information about Care Coordination services today including:   The Care Coordination services include support from the care team which includes your Nurse Coordinator, Clinical Social Worker, or Pharmacist.  The Care Coordination team is here to help remove barriers to the health concerns and goals most important to you. Care Coordination services are voluntary, and the patient may decline or stop services at any time by request to their care team member.   Care Coordination Consent Status: Patient agreed to services and verbal consent obtained.   Follow up plan:  Telephone appointment with care coordination team member scheduled for:  11/12/2021  Encounter Outcome:  Pt. Scheduled from referral   Julian Hy, [place] Direct Dial: [phone]

## 2021-11-10 ENCOUNTER — Telehealth: Payer: Self-pay | Admitting: Orthopaedic Surgery

## 2021-11-10 DIAGNOSIS — I129 Hypertensive chronic kidney disease with stage 1 through stage 4 chronic kidney disease, or unspecified chronic kidney disease: Secondary | ICD-10-CM | POA: Diagnosis not present

## 2021-11-10 DIAGNOSIS — D631 Anemia in chronic kidney disease: Secondary | ICD-10-CM | POA: Diagnosis not present

## 2021-11-10 DIAGNOSIS — M1711 Unilateral primary osteoarthritis, right knee: Secondary | ICD-10-CM | POA: Diagnosis not present

## 2021-11-10 DIAGNOSIS — T84033D Mechanical loosening of internal left knee prosthetic joint, subsequent encounter: Secondary | ICD-10-CM | POA: Diagnosis not present

## 2021-11-10 DIAGNOSIS — N1831 Chronic kidney disease, stage 3a: Secondary | ICD-10-CM | POA: Diagnosis not present

## 2021-11-10 DIAGNOSIS — D472 Monoclonal gammopathy: Secondary | ICD-10-CM | POA: Diagnosis not present

## 2021-11-10 NOTE — Telephone Encounter (Signed)
[facility] home health-[phone], reference home health occ. [place]. Please call

## 2021-11-10 NOTE — Telephone Encounter (Signed)
Verbal order was given. 

## 2021-11-11 DIAGNOSIS — C9000 Multiple myeloma not having achieved remission: Secondary | ICD-10-CM | POA: Diagnosis not present

## 2021-11-11 DIAGNOSIS — I10 Essential (primary) hypertension: Secondary | ICD-10-CM | POA: Diagnosis not present

## 2021-11-12 ENCOUNTER — Ambulatory Visit: Payer: Self-pay

## 2021-11-12 DIAGNOSIS — D631 Anemia in chronic kidney disease: Secondary | ICD-10-CM | POA: Diagnosis not present

## 2021-11-12 DIAGNOSIS — T84033D Mechanical loosening of internal left knee prosthetic joint, subsequent encounter: Secondary | ICD-10-CM | POA: Diagnosis not present

## 2021-11-12 DIAGNOSIS — I129 Hypertensive chronic kidney disease with stage 1 through stage 4 chronic kidney disease, or unspecified chronic kidney disease: Secondary | ICD-10-CM | POA: Diagnosis not present

## 2021-11-12 DIAGNOSIS — M1711 Unilateral primary osteoarthritis, right knee: Secondary | ICD-10-CM | POA: Diagnosis not present

## 2021-11-12 DIAGNOSIS — D472 Monoclonal gammopathy: Secondary | ICD-10-CM | POA: Diagnosis not present

## 2021-11-12 DIAGNOSIS — N1831 Chronic kidney disease, stage 3a: Secondary | ICD-10-CM | POA: Diagnosis not present

## 2021-11-12 NOTE — Patient Outreach (Signed)
  Care Coordination   Follow Up Visit Note   11/12/2021 Name: Diane Daugherty MRN: [id number] DOB: 06/06/1940  Diane Daugherty is a 81 y.o. year old female who sees Sagardia, Ines Bloomer, MD for primary care. I spoke with  Diane Daugherty by phone today.  What matters to the patients health and wellness today?  Patient is post left knee replacement on 10/08/21. She was discharged from [facility] on 11/04/21. She reports home health has started care PT/OT. She states she is using a walker and sometimes a cane. She reports she is doing well post knee replacement and has an upcoming appointment with orthopedic provider. She states that she has concerns regarding lab work done on 10/29/21 and will discuss with provider at upcoming appointments.    Goals Addressed             This Visit's Progress    Continue to improve post rehab       Care Coordination Interventions: Confirmed home health has started care Advised patient to continue to perform home health therapy exercises as recommended Assessed social determinant of health barriers Discussed follow up appointments. Patient has an appointment with orthopedic provider on 11/18/21 and per patient, nephrologist appointment on 12/19/21. Encouraged patient to contact provider for health questions or concerns      SDOH assessments and interventions completed:  Yes  SDOH Interventions Today    Flowsheet Row Most Recent Value  SDOH Interventions   Food Insecurity Interventions Intervention Not Indicated  Housing Interventions Intervention Not Indicated  Transportation Interventions Intervention Not Indicated  Utilities Interventions Intervention Not Indicated     Care Coordination Interventions Activated:  Yes  Care Coordination Interventions:  Yes, provided   Follow up plan: Follow up call scheduled for 12/20/21    Encounter Outcome:  Pt. Visit Completed   Thea Silversmith, RN, MSN, BSN, [place] Coordinator [phone]

## 2021-11-15 DIAGNOSIS — I129 Hypertensive chronic kidney disease with stage 1 through stage 4 chronic kidney disease, or unspecified chronic kidney disease: Secondary | ICD-10-CM | POA: Diagnosis not present

## 2021-11-15 DIAGNOSIS — D631 Anemia in chronic kidney disease: Secondary | ICD-10-CM | POA: Diagnosis not present

## 2021-11-15 DIAGNOSIS — M1711 Unilateral primary osteoarthritis, right knee: Secondary | ICD-10-CM | POA: Diagnosis not present

## 2021-11-15 DIAGNOSIS — T84033D Mechanical loosening of internal left knee prosthetic joint, subsequent encounter: Secondary | ICD-10-CM | POA: Diagnosis not present

## 2021-11-15 DIAGNOSIS — D472 Monoclonal gammopathy: Secondary | ICD-10-CM | POA: Diagnosis not present

## 2021-11-15 DIAGNOSIS — N1831 Chronic kidney disease, stage 3a: Secondary | ICD-10-CM | POA: Diagnosis not present

## 2021-11-16 DIAGNOSIS — I129 Hypertensive chronic kidney disease with stage 1 through stage 4 chronic kidney disease, or unspecified chronic kidney disease: Secondary | ICD-10-CM | POA: Diagnosis not present

## 2021-11-16 DIAGNOSIS — D472 Monoclonal gammopathy: Secondary | ICD-10-CM | POA: Diagnosis not present

## 2021-11-16 DIAGNOSIS — T84033D Mechanical loosening of internal left knee prosthetic joint, subsequent encounter: Secondary | ICD-10-CM | POA: Diagnosis not present

## 2021-11-16 DIAGNOSIS — D631 Anemia in chronic kidney disease: Secondary | ICD-10-CM | POA: Diagnosis not present

## 2021-11-16 DIAGNOSIS — M1711 Unilateral primary osteoarthritis, right knee: Secondary | ICD-10-CM | POA: Diagnosis not present

## 2021-11-16 DIAGNOSIS — N1831 Chronic kidney disease, stage 3a: Secondary | ICD-10-CM | POA: Diagnosis not present

## 2021-11-18 ENCOUNTER — Telehealth: Payer: Self-pay | Admitting: *Deleted

## 2021-11-18 ENCOUNTER — Encounter: Payer: Self-pay | Admitting: Orthopaedic Surgery

## 2021-11-18 ENCOUNTER — Other Ambulatory Visit: Payer: Self-pay

## 2021-11-18 ENCOUNTER — Ambulatory Visit (INDEPENDENT_AMBULATORY_CARE_PROVIDER_SITE_OTHER): Payer: Medicare Other | Admitting: Orthopaedic Surgery

## 2021-11-18 DIAGNOSIS — Z96652 Presence of left artificial knee joint: Secondary | ICD-10-CM

## 2021-11-18 NOTE — Progress Notes (Signed)
The patient is now 6 weeks status post a left knee revision arthroplasty from failure of components from aseptic loosening from an old knee replacement.  She is 81 years old.  She does ambulate with a rolling walker.  She has had home therapy after being in skilled nursing.  She states that she feels like she is ready to drive and be set up for outpatient therapy.  Her left knee incision looks good.  Her extension is almost full and I can flex her to just at 90 degrees and maybe a little beyond that.  She does report swelling that is worse in the end of the day and then goes down overnight.  Otherwise she seems to be doing well.  She is not taking any pain medication.  From my standpoint she can drive since she is not taking pain medication and since it is her left knee and she feels comfortable.  We will set her up for outpatient physical therapy hopefully here.  I will see her back in 4 weeks to see how she is doing overall but no x-rays are needed.

## 2021-11-18 NOTE — Telephone Encounter (Signed)
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  We will plan to see her back in 3 months as scheduled. ----- Message ----- From: Buel Ream, Lab In [place] Sent: 10/29/2021  10:35 AM EDT To: Orson Slick, MD

## 2021-11-18 NOTE — Telephone Encounter (Signed)
TCT patient regarding recent lab results. 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  Dr. Lorenso Courier advises to repeat in 3 months. Pt voiced understanding and is aware of her next appt on 02/10/22

## 2021-11-19 DIAGNOSIS — I129 Hypertensive chronic kidney disease with stage 1 through stage 4 chronic kidney disease, or unspecified chronic kidney disease: Secondary | ICD-10-CM | POA: Diagnosis not present

## 2021-11-19 DIAGNOSIS — M1711 Unilateral primary osteoarthritis, right knee: Secondary | ICD-10-CM | POA: Diagnosis not present

## 2021-11-19 DIAGNOSIS — D631 Anemia in chronic kidney disease: Secondary | ICD-10-CM | POA: Diagnosis not present

## 2021-11-19 DIAGNOSIS — T84033D Mechanical loosening of internal left knee prosthetic joint, subsequent encounter: Secondary | ICD-10-CM | POA: Diagnosis not present

## 2021-11-19 DIAGNOSIS — D472 Monoclonal gammopathy: Secondary | ICD-10-CM | POA: Diagnosis not present

## 2021-11-19 DIAGNOSIS — N1831 Chronic kidney disease, stage 3a: Secondary | ICD-10-CM | POA: Diagnosis not present

## 2021-11-22 DIAGNOSIS — D472 Monoclonal gammopathy: Secondary | ICD-10-CM | POA: Diagnosis not present

## 2021-11-22 DIAGNOSIS — D631 Anemia in chronic kidney disease: Secondary | ICD-10-CM | POA: Diagnosis not present

## 2021-11-22 DIAGNOSIS — N1831 Chronic kidney disease, stage 3a: Secondary | ICD-10-CM | POA: Diagnosis not present

## 2021-11-22 DIAGNOSIS — M1711 Unilateral primary osteoarthritis, right knee: Secondary | ICD-10-CM | POA: Diagnosis not present

## 2021-11-22 DIAGNOSIS — I129 Hypertensive chronic kidney disease with stage 1 through stage 4 chronic kidney disease, or unspecified chronic kidney disease: Secondary | ICD-10-CM | POA: Diagnosis not present

## 2021-11-22 DIAGNOSIS — T84033D Mechanical loosening of internal left knee prosthetic joint, subsequent encounter: Secondary | ICD-10-CM | POA: Diagnosis not present

## 2021-11-25 DIAGNOSIS — T84033D Mechanical loosening of internal left knee prosthetic joint, subsequent encounter: Secondary | ICD-10-CM | POA: Diagnosis not present

## 2021-11-25 DIAGNOSIS — I129 Hypertensive chronic kidney disease with stage 1 through stage 4 chronic kidney disease, or unspecified chronic kidney disease: Secondary | ICD-10-CM | POA: Diagnosis not present

## 2021-11-25 DIAGNOSIS — N1831 Chronic kidney disease, stage 3a: Secondary | ICD-10-CM | POA: Diagnosis not present

## 2021-11-25 DIAGNOSIS — D631 Anemia in chronic kidney disease: Secondary | ICD-10-CM | POA: Diagnosis not present

## 2021-11-25 DIAGNOSIS — D472 Monoclonal gammopathy: Secondary | ICD-10-CM | POA: Diagnosis not present

## 2021-11-25 DIAGNOSIS — M1711 Unilateral primary osteoarthritis, right knee: Secondary | ICD-10-CM | POA: Diagnosis not present

## 2021-11-25 NOTE — Telephone Encounter (Signed)
Diane Daugherty (PT) called from [facility] with [place]. Pt being discharged from physical therapy and start outpatient pt Monday. Diane Daugherty phone number is [phone] if we have any questions

## 2021-11-26 DIAGNOSIS — Z23 Encounter for immunization: Secondary | ICD-10-CM | POA: Diagnosis not present

## 2021-11-30 NOTE — Therapy (Signed)
OUTPATIENT PHYSICAL THERAPY LOWER EXTREMITY EVALUATION   Patient Name: Diane Daugherty MRN: [id number] DOB:30-Aug-1940, 81 y.o., female Today's Date: 12/01/2021   PT End of Session - 12/01/21 1112     Visit Number 1    Number of Visits 24    Date for PT Re-Evaluation 02/25/22    Progress Note Due on Visit 10    PT Start Time 1104    PT Stop Time 1150    PT Time Calculation (min) 46 min    Activity Tolerance Patient tolerated treatment well    Behavior During Therapy [facility] for tasks assessed/performed             Past Medical History:  Diagnosis Date   Anemia 09/24/1968   "after childbirth"   Arthritis    "feels like all over"   Carpal tunnel syndrome    Carpal tunnel syndrome    left   Cataract    immature on right   Chronic bronchitis ([place])    "get it most years" (10/08/2014)   Chronic kidney disease    stage III, per Pt   Family history of anesthesia complication    daughter gets sick   History of blood transfusion    "w/one of my back OR's"   Hypertension    takes Hyzaar daily   Joint pain    "all over"   Joint swelling    Muscle spasms of lower extremity    takes Baclofen daily prn   Nocturia    OSA on CPAP    Peripheral vascular disease (HCC)    PFO (patent foramen ovale)    positive saline bubble study 05/12/09 ([place])   Pneumonia 2010/ 2011   PONV (postoperative nausea and vomiting)    woke up during hysterectomy   Shortness of breath    with exertion;Albuterol prn   Urinary frequency    Past Surgical History:  Procedure Laterality Date   ABDOMINAL HYSTERECTOMY  1970's?   APPENDECTOMY     BACK SURGERY     BREAST BIOPSY Right 39's   [place]; 1963; [place]  80's   d/t pneumothorax   COLONOSCOPY     FRACTURE SURGERY     HARDWARE REMOVAL Right 1990's   "took screw out of my lower leg ~ 1 yr after putting it in"   HEMILAMINOTOMY LUMBAR SPINE  02/2002   L5; decompression of the L5 and S1 nerve root; synovial  cyst excision/notes 06/09/2010   KNEE ARTHROSCOPY Right ?2013   LUMBAR LAMINECTOMY/DECOMPRESSION MICRODISCECTOMY  02/2010   POSTERIOR LUMBAR FUSION  05/2003   L4-5 and S-1    TIBIA FRACTURE SURGERY Right 1990's   "put screw in"   TOTAL KNEE ARTHROPLASTY Right 08/28/2012   Procedure: TOTAL KNEE ARTHROPLASTY;  Surgeon: Garald Balding, MD;  Location: [facility];  Service: Orthopedics;  Laterality: Right;   TOTAL KNEE ARTHROPLASTY Left 10/07/2014   TOTAL KNEE ARTHROPLASTY Left 10/07/2014   Procedure: TOTAL KNEE ARTHROPLASTY;  Surgeon: Garald Balding, MD;  Location: [facility];  Service: Orthopedics;  Laterality: Left;   TOTAL KNEE REVISION Left 10/08/2021   Procedure: LEFT TOTAL KNEE REVISION;  Surgeon: Mcarthur Rossetti, MD;  Location: [facility];  Service: Orthopedics;  Laterality: Left;   Patient Active Problem List   Diagnosis Date Noted   Status post revision of total replacement of left knee 10/08/2021   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/07/2021   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  Stage 3a chronic kidney disease ([place]) 05/27/2021   History of multiple myeloma 05/27/2021   Primary osteoarthritis of left knee 10/07/2014   History of left knee replacement 10/07/2014   Asthma, chronic 08/30/2012   Essential hypertension 08/30/2012   Osteoarthritis of right knee 08/30/2012   Sleep apnea 08/30/2012   Severe obesity (BMI >= 40) ([place]) 08/30/2012    PCP: Horald Pollen, MD   REFERRING PROVIDER: Mcarthur Rossetti, MD  REFERRING DIAG: [phone] (ICD-10-CM) - Status post revision of total replacement of left knee  THERAPY DIAG:  Acute pain of left knee  Stiffness of left knee, not elsewhere classified  Difficulty in walking, not elsewhere classified  Localized edema  Rationale for Evaluation and Treatment: Rehabilitation  ONSET DATE: 10/08/21 left TKA revision  SUBJECTIVE:   SUBJECTIVE STATEMENT: Pt arriving today s/p left total knee revision on 10/08/21. Pt  stating pain is 5/10.  Pt stating she is planning on having a Rt TKA as soon as she recovers from her left revision.   PERTINENT HISTORY:  arthritis, anemia, CKD, HTN, muscle spasms in LE's, back surgery, hemilaminotomy lumbar spine 2004, TKA on Rt 2014, tibia fx 1990's, hardward removal 1990's one year after placement, left TKA 2016, CA PAIN:  NPRS scale: 5/10 Pain location: left knee Pain description: achy,  Aggravating factors: bending, standing prolong Relieving factors: ice, elevation, over the counter pain meds  PRECAUTIONS: None  WEIGHT BEARING RESTRICTIONS: No  FALLS:  Has patient fallen in last 6 months? 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 No perihardware lucency is seen to indicate hardware failure or loosening. Expected postoperative changes including mild intra-articular fluid and air and mild anterior soft tissue swelling. There are anterior surgical skin staples. No acute fracture or dislocation.    PATIENT SURVEYS:  12/01/21 FOTO intake:  54 %  COGNITION: Overall cognitive status: WFL    SENSATION: 12/01/21:  numbness on lateral lower incision  EDEMA:  12/01/21: Circumferential: Rt: 49. 5 centimeters           Left 52 centimeters   POSTURE:  12/01/21: rounded shoulders, forward head, and decreased lumbar lordosis  PALPATION: 12/01/21 Tenderness noted along incision site  LOWER EXTREMITY ROM:   ROM Right 12/01/21  Left 12/01/21  Hip flexion    Hip extension    Hip abduction    Hip adduction    Hip internal rotation    Hip external rotation    Knee flexion A: 76 P: 80  A: 96 P: 100   Knee extension A: 0  A: -5 P: 0   (Blank rows = not tested)  LOWER EXTREMITY MMT:  MMT Right eval Left eval  Hip flexion 4/5 4/5  Hip extension    Hip abduction 4/5 4/5  Hip adduction 4/5 4/5  Knee flexion 4/5 4/5  Knee extension 4/5 4/5  Ankle dorsiflexion    Ankle plantarflexion     (Blank rows = not tested)   FUNCTIONAL TESTS:  12/01/21:  19 seconds  with no UE support 5 time sit to stand:   GAIT: Distance walked: 40 feet  Assistive device utilized: Single point cane Level of assistance: Modified independence Comments: antalgic gait step through pattern with decreased knee flexion in the Rt, decreased heel strike   TODAY'S TREATMENT                                                                          DATE: Therex:    HEP instruction/performance c cues for techniques, handout provided.  Trial set performed of each for comprehension and symptom assessment.  See below for exercise list  PATIENT EDUCATION:  Education details: HEP, POC Person educated: Patient Education method: Explanation, Demonstration, Verbal cues, and Handouts Education comprehension: verbalized understanding, returned demonstration, and verbal cues required  HOME EXERCISE PROGRAM: Access Code: [email or web address] URL: [email or web address] Date: 12/01/2021 Prepared by: Kearney Hard  Exercises - Sit to Stand with Counter Support  - 3-4 x daily - 7 x weekly - 2 sets - 10 reps - Seated Small Alternating Straight Leg Lifts with Heel Touch  - 3-4 x daily - 7 x weekly - 2 sets - 10 reps - Seated Long Arc Quad  - 3-4 x daily - 7 x weekly - 2 sets - 10 reps - 3 seconds hold - Supine Heel Slide with Strap  - 3-4 x daily - 7 x weekly - 10 reps - 5 seconds hold  ASSESSMENT:  CLINICAL IMPRESSION: Patient is a 81  y.o. who comes to clinic with complaints of left knee pain s/p left TKA revision on 10/08/21 with significant PMH with mobility, strength and movement coordination deficits that impair their ability to perform usual daily and recreational functional activities without increase difficulty/symptoms at this time. Pt was discharged from acute care to a skilled nursing facility before returning home. Patient to benefit from skilled PT services to address impairments and limitations to improve to previous level of function without restriction secondary to condition.   OBJECTIVE IMPAIRMENTS: decreased activity tolerance, decreased balance, decreased mobility, difficulty walking, decreased ROM, increased edema, obesity, and pain.   ACTIVITY LIMITATIONS: bending, sitting, standing, squatting, sleeping, and stairs  PARTICIPATION LIMITATIONS: meal prep, cleaning, laundry, shopping, and community activity  PERSONAL FACTORS: 3+ comorbidities: see above  are also affecting patient's functional outcome.   REHAB POTENTIAL: Good  CLINICAL DECISION MAKING: Stable/uncomplicated  EVALUATION COMPLEXITY: Low   GOALS: Goals reviewed with patient? Yes  SHORT TERM GOALS: (target date for Short term goals are 4 weeks 12/31/21)   1.  Patient will demonstrate independent use of home exercise program to maintain progress from in clinic treatments.  Goal status: New  LONG TERM GOALS: (target dates for all long term goals are 12 weeks  02/25/22 )   1. Patient will demonstrate/report pain at worst less than or equal to 2/10 to facilitate minimal limitation in daily activity secondary to pain symptoms.  Goal status: New   2. Patient will demonstrate independent use of home exercise program to facilitate ability to maintain/progress functional gains from skilled physical therapy services.  Goal status: New   3. Patient will demonstrate FOTO outcome > or = 61 % to indicate reduced disability due to condition.  Goal  status: New  4.  Patient will demonstrate left  LE MMT >/= 4/5 throughout to faciltiate usual transfers, stairs, squatting at [facility] for daily life.   Goal status: New   5.  Patient will demonstrate Left active knee flexion to >/= 115 degrees  Goal status: New   6.  Pt will be able to navigate community surfaces >/= 20 minutes including curb step with LRAD.   Goal status: New    PLAN:  PT FREQUENCY: 2x/week  PT DURATION: 12 weeks  PLANNED INTERVENTIONS: Therapeutic exercises, Therapeutic activity, Neuro Muscular re-education, Balance training, Gait training, Patient/Family education, Joint mobilization, Stair training, DME instructions, Dry Needling, Electrical stimulation, Traction, Cryotherapy, vasopneumatic deviceMoist heat, Taping, Ultrasound, Ionotophoresis 72m/ml Dexamethasone, and Manual therapy.  All included unless contraindicated  PLAN FOR NEXT SESSION: Review HEP knowledge/results, nustep vs bike, quad strengthening, gait training, step ups, Manual therapy for ROM     JOretha Caprice PT, MPT 12/01/2021, 11:48 AM

## 2021-12-01 ENCOUNTER — Ambulatory Visit (INDEPENDENT_AMBULATORY_CARE_PROVIDER_SITE_OTHER): Payer: Medicare Other | Admitting: Physical Therapy

## 2021-12-01 ENCOUNTER — Encounter: Payer: Self-pay | Admitting: Physical Therapy

## 2021-12-01 DIAGNOSIS — R262 Difficulty in walking, not elsewhere classified: Secondary | ICD-10-CM

## 2021-12-01 DIAGNOSIS — M25662 Stiffness of left knee, not elsewhere classified: Secondary | ICD-10-CM | POA: Diagnosis not present

## 2021-12-01 DIAGNOSIS — M25562 Pain in left knee: Secondary | ICD-10-CM | POA: Diagnosis not present

## 2021-12-01 DIAGNOSIS — R600 Localized edema: Secondary | ICD-10-CM | POA: Diagnosis not present

## 2021-12-08 ENCOUNTER — Ambulatory Visit (INDEPENDENT_AMBULATORY_CARE_PROVIDER_SITE_OTHER): Payer: Medicare Other | Admitting: Physical Therapy

## 2021-12-08 ENCOUNTER — Encounter: Payer: Self-pay | Admitting: Physical Therapy

## 2021-12-08 DIAGNOSIS — R262 Difficulty in walking, not elsewhere classified: Secondary | ICD-10-CM

## 2021-12-08 DIAGNOSIS — R600 Localized edema: Secondary | ICD-10-CM | POA: Diagnosis not present

## 2021-12-08 DIAGNOSIS — M25662 Stiffness of left knee, not elsewhere classified: Secondary | ICD-10-CM | POA: Diagnosis not present

## 2021-12-08 DIAGNOSIS — M25562 Pain in left knee: Secondary | ICD-10-CM

## 2021-12-08 NOTE — Therapy (Signed)
OUTPATIENT PHYSICAL THERAPY TREATMENT NOTE   Patient Name: Diane Daugherty MRN: [id number] DOB:05-09-1940, 81 y.o., female Today's Date: 12/08/2021   END OF SESSION:   PT End of Session - 12/08/21 1308     Visit Number 2    Number of Visits 24    Date for PT Re-Evaluation 02/25/22    Progress Note Due on Visit 10    PT Start Time 1300    PT Stop Time 1340    PT Time Calculation (min) 40 min    Activity Tolerance Patient tolerated treatment well    Behavior During Therapy [facility] for tasks assessed/performed             Past Medical History:  Diagnosis Date   Anemia 09/24/1968   "after childbirth"   Arthritis    "feels like all over"   Carpal tunnel syndrome    Carpal tunnel syndrome    left   Cataract    immature on right   Chronic bronchitis ([place])    "get it most years" (10/08/2014)   Chronic kidney disease    stage III, per Pt   Family history of anesthesia complication    daughter gets sick   History of blood transfusion    "w/one of my back OR's"   Hypertension    takes Hyzaar daily   Joint pain    "all over"   Joint swelling    Muscle spasms of lower extremity    takes Baclofen daily prn   Nocturia    OSA on CPAP    Peripheral vascular disease (HCC)    PFO (patent foramen ovale)    positive saline bubble study 05/12/09 ([place])   Pneumonia 2010/ 2011   PONV (postoperative nausea and vomiting)    woke up during hysterectomy   Shortness of breath    with exertion;Albuterol prn   Urinary frequency    Past Surgical History:  Procedure Laterality Date   ABDOMINAL HYSTERECTOMY  1970's?   APPENDECTOMY     BACK SURGERY     BREAST BIOPSY Right 88's   [place]; 1963; [place]  80's   d/t pneumothorax   COLONOSCOPY     FRACTURE SURGERY     HARDWARE REMOVAL Right 1990's   "took screw out of my lower leg ~ 1 yr after putting it in"   HEMILAMINOTOMY LUMBAR SPINE  02/2002   L5; decompression of the L5 and S1 nerve root;  synovial cyst excision/notes 06/09/2010   KNEE ARTHROSCOPY Right ?2013   LUMBAR LAMINECTOMY/DECOMPRESSION MICRODISCECTOMY  02/2010   POSTERIOR LUMBAR FUSION  05/2003   L4-5 and S-1    TIBIA FRACTURE SURGERY Right 1990's   "put screw in"   TOTAL KNEE ARTHROPLASTY Right 08/28/2012   Procedure: TOTAL KNEE ARTHROPLASTY;  Surgeon: Garald Balding, MD;  Location: [place];  Service: Orthopedics;  Laterality: Right;   TOTAL KNEE ARTHROPLASTY Left 10/07/2014   TOTAL KNEE ARTHROPLASTY Left 10/07/2014   Procedure: TOTAL KNEE ARTHROPLASTY;  Surgeon: Garald Balding, MD;  Location: [facility];  Service: Orthopedics;  Laterality: Left;   TOTAL KNEE REVISION Left 10/08/2021   Procedure: LEFT TOTAL KNEE REVISION;  Surgeon: Mcarthur Rossetti, MD;  Location[facility];  Service: Orthopedics;  Laterality: Left;   Patient Active Problem List   Diagnosis Date Noted   Status post revision of total replacement of left knee 10/08/2021   Failed total left knee replacement ([place]) 10/07/2021   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   Stage 3a chronic kidney disease ([place]) 05/27/2021   History of multiple myeloma 05/27/2021   Primary osteoarthritis of left knee 10/07/2014   History of left knee replacement 10/07/2014   Asthma, chronic 08/30/2012   Essential hypertension 08/30/2012   Osteoarthritis of right knee 08/30/2012   Sleep apnea 08/30/2012   Severe obesity (BMI >= 40) ([place]) 08/30/2012     THERAPY DIAG:  Acute pain of left knee  Stiffness of left knee, not elsewhere classified  Difficulty in walking, not elsewhere classified  Localized edema  PCP: Horald Pollen, MD     REFERRING PROVIDER: Mcarthur Rossetti, MD   REFERRING DIAG: [phone] (ICD-10-CM) - Status post revision of total replacement of left knee  Rationale for Evaluation and Treatment: Rehabilitation   ONSET DATE: 10/08/21 left TKA revision   SUBJECTIVE:    SUBJECTIVE STATEMENT: Pt arriving today stating she has some  stiffness in her knee, she walked around [employer] yesterday so has some pain/soreness from that   PERTINENT HISTORY:  arthritis, anemia, CKD, HTN, muscle spasms in LE's, back surgery, hemilaminotomy lumbar spine 2004, TKA on Rt 2014, tibia fx 1990's, hardward removal 1990's one year after placement, left TKA 2016, CA PAIN:  NPRS scale: 5/10 Pain location: left knee Pain description: achy,  Aggravating factors: bending, standing prolong Relieving factors: ice, elevation, over the counter pain meds   PRECAUTIONS: None   WEIGHT BEARING RESTRICTIONS: No   FALLS:  Has patient fallen in last 6 months? 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 No perihardware lucency is seen to indicate hardware failure or loosening. Expected postoperative changes including mild intra-articular fluid and air and mild anterior soft tissue swelling. There are anterior surgical skin staples. No acute fracture or dislocation.     PATIENT SURVEYS:  12/01/21 FOTO intake:  54 %   COGNITION: Overall cognitive status: WFL                  SENSATION: 12/01/21:  numbness on lateral lower incision   EDEMA:  12/01/21: Circumferential: Rt: 49. 5 centimeters                               Left 52 centimeters     POSTURE:  12/01/21: rounded shoulders, forward head, and decreased lumbar lordosis   PALPATION: 12/01/21 Tenderness noted along incision site   LOWER EXTREMITY  ROM:    ROM Right 12/01/21 Left 12/01/21  Hip flexion      Hip extension      Hip abduction      Hip adduction      Hip internal rotation      Hip external rotation      Knee flexion A: 76 P: 80   A: 96 P: 100    Knee extension A: 0   A: -5 P: 0   (Blank rows = not tested)  LOWER EXTREMITY MMT:   MMT Right eval Left eval  Hip flexion 4/5 4/5  Hip extension      Hip abduction 4/5 4/5  Hip adduction 4/5 4/5  Knee flexion 4/5 4/5  Knee extension 4/5 4/5  Ankle dorsiflexion      Ankle plantarflexion       (Blank rows = not tested)     FUNCTIONAL TESTS:  12/01/21:  19 seconds with no UE support 5 time sit to stand:    GAIT: Distance walked: 40 feet  Assistive device utilized: Single point cane Level of assistance: Modified independence Comments: antalgic gait step through pattern with decreased knee flexion in the Rt, decreased heel strike     TODAY'S TREATMENT                                                                          DATE: 12/08/21 Nu step L5 X 6 min Seated LAQ with 3 sec hold X 15 Seated SLR 2X10 Sit to stand X 10 without UE support Seated hamstring curl X 15 with red band Step ups onto 6 inch step X 10 leading with left, one UE support  -Manual: supine manual hamstring stretching, supine left knee PROM with flexion emphasis with overpressure and gentle flexion mobs grade 1-2  -Vasopnuematic device X 10 min, medium compression, 34 deg to Lt knee    Therex:    HEP instruction/performance c cues for techniques, handout provided.  Trial set performed of each for comprehension and symptom assessment.  See below for exercise list   PATIENT EDUCATION:  Education details: HEP, POC Person educated: Patient Education method: Explanation, Demonstration, Verbal cues, and Handouts Education comprehension: verbalized understanding, returned demonstration, and verbal cues required   HOME EXERCISE PROGRAM: Access Code: [email or web address] URL:  [email or web address] Date: 12/01/2021 Prepared by: Kearney Hard   Exercises - Sit to Stand with Counter Support  - 3-4 x daily - 7 x weekly - 2 sets - 10 reps - Seated Small Alternating Straight Leg Lifts with Heel Touch  - 3-4 x daily - 7 x weekly - 2 sets - 10 reps - Seated Long Arc Quad  - 3-4 x daily - 7 x weekly - 2 sets - 10 reps - 3 seconds hold - Supine Heel Slide with Strap  - 3-4 x daily - 7 x weekly - 10 reps - 5 seconds hold   ASSESSMENT:   CLINICAL IMPRESSION: She appears to overall be doing well up to this point S/P TKA revision. She had good tolerance to session today. She will continue to benefit from PT for knee ROM and strength.    OBJECTIVE IMPAIRMENTS: decreased activity tolerance, decreased balance, decreased mobility, difficulty walking, decreased ROM, increased edema, obesity, and pain.    ACTIVITY LIMITATIONS: bending, sitting, standing, squatting, sleeping, and stairs   PARTICIPATION LIMITATIONS: meal prep, cleaning, laundry, shopping, and community activity   PERSONAL FACTORS: 3+ comorbidities: see above  are also affecting patient's functional outcome.    REHAB POTENTIAL: Good   CLINICAL DECISION MAKING: Stable/uncomplicated   EVALUATION COMPLEXITY: Low     GOALS: Goals reviewed with patient? Yes   SHORT TERM GOALS: (target date for Short term goals are 4 weeks 12/31/21)  1.  Patient will demonstrate independent use of home exercise program to maintain progress from in clinic treatments.   Goal status: New   LONG TERM GOALS: (target dates for all long term goals are 12 weeks  02/25/22 )   1. Patient will demonstrate/report pain at worst less than or equal to 2/10 to facilitate minimal limitation in daily activity secondary to pain symptoms.   Goal status: New   2. Patient will demonstrate independent use of home exercise program to facilitate ability to maintain/progress functional gains from skilled physical therapy services.    Goal status: New   3. Patient will demonstrate FOTO outcome > or = 61 % to indicate reduced disability due to condition.   Goal status: New   4.  Patient will demonstrate left  LE MMT >/= 4/5 throughout to faciltiate usual transfers, stairs, squatting at [facility] for daily life.    Goal status: New   5.  Patient will demonstrate Left active knee flexion to >/= 115 degrees   Goal status: New   6.  Pt will be able to navigate community surfaces >/= 20 minutes including curb step with LRAD.    Goal status: New       PLAN:   PT FREQUENCY: 2x/week   PT DURATION: 12 weeks   PLANNED INTERVENTIONS: Therapeutic exercises, Therapeutic activity, Neuro Muscular re-education, Balance training, Gait training, Patient/Family education, Joint mobilization, Stair training, DME instructions, Dry Needling, Electrical stimulation, Traction, Cryotherapy, vasopneumatic deviceMoist heat, Taping, Ultrasound, Ionotophoresis 26m/ml Dexamethasone, and Manual therapy.  All included unless contraindicated   PLAN FOR NEXT SESSION:  nustep vs bike due to limited Rt knee flexion ROM, quad strengthening, gait training, step ups, Manual therapy for ROM  BDebbe Odea PT,DPT 12/08/2021, 1:34 PM

## 2021-12-10 ENCOUNTER — Ambulatory Visit (INDEPENDENT_AMBULATORY_CARE_PROVIDER_SITE_OTHER): Payer: Medicare Other | Admitting: Physical Therapy

## 2021-12-10 ENCOUNTER — Encounter: Payer: Self-pay | Admitting: Physical Therapy

## 2021-12-10 DIAGNOSIS — R262 Difficulty in walking, not elsewhere classified: Secondary | ICD-10-CM | POA: Diagnosis not present

## 2021-12-10 DIAGNOSIS — M25662 Stiffness of left knee, not elsewhere classified: Secondary | ICD-10-CM

## 2021-12-10 DIAGNOSIS — R600 Localized edema: Secondary | ICD-10-CM

## 2021-12-10 DIAGNOSIS — M25562 Pain in left knee: Secondary | ICD-10-CM | POA: Diagnosis not present

## 2021-12-10 NOTE — Therapy (Signed)
OUTPATIENT PHYSICAL THERAPY TREATMENT NOTE   Patient Name: Diane Daugherty MRN: [id number] DOB:[birth year], 81 y.o., female Today's Date: 12/10/2021   END OF SESSION:   PT End of Session - 12/10/21 1014     Visit Number 3    Number of Visits 24    Date for PT Re-Evaluation 02/25/22    Progress Note Due on Visit 10    PT Start Time 1012    PT Stop Time 1050    PT Time Calculation (min) 38 min    Activity Tolerance Patient tolerated treatment well    Behavior During Therapy WFL for tasks assessed/performed             Past Medical History:  Diagnosis Date   Anemia 09/24/1968   "after childbirth"   Arthritis    "feels like all over"   Carpal tunnel syndrome    Carpal tunnel syndrome    left   Cataract    immature on right   Chronic bronchitis ([place])    "get it most years" (10/08/2014)   Chronic kidney disease    stage III, per Pt   Family history of anesthesia complication    daughter gets sick   History of blood transfusion    "w/one of my back OR's"   Hypertension    takes Hyzaar daily   Joint pain    "all over"   Joint swelling    Muscle spasms of lower extremity    takes Baclofen daily prn   Nocturia    OSA on CPAP    Peripheral vascular disease (HCC)    PFO (patent foramen ovale)    positive saline bubble study 05/12/09 ([place])   Pneumonia 2010/ 2011   PONV (postoperative nausea and vomiting)    woke up during hysterectomy   Shortness of breath    with exertion;Albuterol prn   Urinary frequency    Past Surgical History:  Procedure Laterality Date   ABDOMINAL HYSTERECTOMY  1970's?   APPENDECTOMY     BACK SURGERY     BREAST BIOPSY Right 31's   [place]; 1963; [place]  80's   d/t pneumothorax   COLONOSCOPY     FRACTURE SURGERY     HARDWARE REMOVAL Right 1990's   "took screw out of my lower leg ~ 1 yr after putting it in"   HEMILAMINOTOMY LUMBAR SPINE  02/2002   L5; decompression of the L5 and S1 nerve root;  synovial cyst excision/notes 06/09/2010   KNEE ARTHROSCOPY Right ?2013   LUMBAR LAMINECTOMY/DECOMPRESSION MICRODISCECTOMY  02/2010   POSTERIOR LUMBAR FUSION  05/2003   L4-5 and S-1    TIBIA FRACTURE SURGERY Right 1990's   "put screw in"   TOTAL KNEE ARTHROPLASTY Right 08/28/2012   Procedure: TOTAL KNEE ARTHROPLASTY;  Surgeon: Garald Balding, MD;  Location: [place];  Service: Orthopedics;  Laterality: Right;   TOTAL KNEE ARTHROPLASTY Left 10/07/2014   TOTAL KNEE ARTHROPLASTY Left 10/07/2014   Procedure: TOTAL KNEE ARTHROPLASTY;  Surgeon: Garald Balding, MD;  Location: [place];  Service: Orthopedics;  Laterality: Left;   TOTAL KNEE REVISION Left 10/08/2021   Procedure: LEFT TOTAL KNEE REVISION;  Surgeon: Mcarthur Rossetti, MD;  [facility];  Service: Orthopedics;  Laterality: Left;   Patient Active Problem List   Diagnosis Date Noted   Status post revision of total replacement of left knee 10/08/2021   Failed total left knee replacement ([place]) 10/07/2021   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   Stage 3a chronic kidney disease ([place]) 05/27/2021   History of multiple myeloma 05/27/2021   Primary osteoarthritis of left knee 10/07/2014   History of left knee replacement 10/07/2014   Asthma, chronic 08/30/2012   Essential hypertension 08/30/2012   Osteoarthritis of right knee 08/30/2012   Sleep apnea 08/30/2012   Severe obesity (BMI >= 40) ([place]) 08/30/2012     THERAPY DIAG:  Acute pain of left knee  Stiffness of left knee, not elsewhere classified  Difficulty in walking, not elsewhere classified  Localized edema  PCP: Horald Pollen, MD     REFERRING PROVIDER: Mcarthur Rossetti, MD   REFERRING DIAG: [phone] (ICD-10-CM) - Status post revision of total replacement of left knee  Rationale for Evaluation and Treatment: Rehabilitation   ONSET DATE: 10/08/21 left TKA revision   SUBJECTIVE:    SUBJECTIVE STATEMENT:    PERTINENT HISTORY:  arthritis, anemia,  CKD, HTN, muscle spasms in LE's, back surgery, hemilaminotomy lumbar spine 2004, TKA on Rt 2014, tibia fx 1990's, hardward removal 1990's one year after placement, left TKA 2016, CA PAIN:  NPRS scale: 5/10 Pain location: left knee Pain description: achy,  Aggravating factors: bending, standing prolong Relieving factors: ice, elevation, over the counter pain meds   PRECAUTIONS: None   WEIGHT BEARING RESTRICTIONS: No   FALLS:  Has patient fallen in last 6 months? 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 No perihardware lucency is seen to indicate hardware failure or loosening. Expected postoperative changes including mild intra-articular fluid and air and mild anterior soft tissue swelling. There are anterior surgical skin staples. No acute fracture or dislocation.     PATIENT SURVEYS:  12/01/21 FOTO intake:  54 %   COGNITION: Overall cognitive status: WFL                  SENSATION: 12/01/21:  numbness on lateral lower incision   EDEMA:  12/01/21: Circumferential: Rt: 49. 5 centimeters                               Left 52 centimeters     POSTURE:  12/01/21: rounded shoulders, forward head, and decreased lumbar lordosis   PALPATION: 12/01/21 Tenderness noted along incision site   LOWER EXTREMITY ROM:    ROM Right 12/01/21 Left 12/01/21  Hip flexion      Hip extension      Hip abduction      Hip adduction      Hip internal rotation       Hip external rotation      Knee flexion A: 76 P: 80   A: 96 P: 100    Knee extension A: 0   A: -5 P: 0   (Blank rows = not tested)   LOWER EXTREMITY MMT:   MMT Right eval Left eval  Hip flexion 4/5 4/5  Hip extension  Hip abduction 4/5 4/5  Hip adduction 4/5 4/5  Knee flexion 4/5 4/5  Knee extension 4/5 4/5  Ankle dorsiflexion      Ankle plantarflexion       (Blank rows = not tested)     FUNCTIONAL TESTS:  12/01/21:  19 seconds with no UE support 5 time sit to stand:    GAIT: Distance walked: 40 feet  Assistive device utilized: Single point cane Level of assistance: Modified independence Comments: antalgic gait step through pattern with decreased knee flexion in the Rt, decreased heel strike     TODAY'S TREATMENT                                                                          DATE: 12/10/21 Recumbent bike L1 X 5 min rocking for ROM (more limited by Rt knee stiffness than left) Seated LAQ 2# 2X10 Leg press left leg only 25# 2X10 Step ups onto 6 inch step X 10 fwd, and X 10 lateral leading with left, bilat UE support in bars Slantboard stretch 30 sec X 3  -Manual: supine manual hamstring stretching, supine left knee PROM with flexion emphasis with overpressure and gentle flexion mobs grade 1-2  -Vasopnuematic declined, states she will ice at home.  12/08/21 Nu step L5 X 6 min Seated LAQ with 3 sec hold X 15 Seated SLR 2X10 Sit to stand X 10 without UE support Seated hamstring curl X 15 with red band Step ups onto 6 inch step X 10 leading with left, one UE support  -Manual: supine manual hamstring stretching, supine left knee PROM with flexion emphasis with overpressure and gentle flexion mobs grade 1-2  -Vasopnuematic device X 10 min, medium compression, 34 deg to Lt knee    Therex:    HEP instruction/performance c cues for techniques, handout provided.  Trial set performed of each for comprehension and symptom assessment.  See below for  exercise list   PATIENT EDUCATION:  Education details: HEP, POC Person educated: Patient Education method: Explanation, Demonstration, Verbal cues, and Handouts Education comprehension: verbalized understanding, returned demonstration, and verbal cues required   HOME EXERCISE PROGRAM: Access Code: [email or web address] URL: [email or web address] Date: 12/01/2021 Prepared by: Kearney Hard   Exercises - Sit to Stand with Counter Support  - 3-4 x daily - 7 x weekly - 2 sets - 10 reps - Seated Small Alternating Straight Leg Lifts with Heel Touch  - 3-4 x daily - 7 x weekly - 2 sets - 10 reps - Seated Long Arc Quad  - 3-4 x daily - 7 x weekly - 2 sets - 10 reps - 3 seconds hold - Supine Heel Slide with Strap  - 3-4 x daily - 7 x weekly - 10 reps - 5 seconds hold   ASSESSMENT:   CLINICAL IMPRESSION: Lt knee is doing well and making good progress howver session somewhat limited by Rt knee pain and stiffness not her left knee. She says her Rt knee will need TKA revision also as MD says it has also become loose. Continued to work to improve knee ROM, strength, and function as tolerated. She will continue to benefit form skilled PT.     OBJECTIVE IMPAIRMENTS: decreased activity tolerance, decreased balance, decreased  mobility, difficulty walking, decreased ROM, increased edema, obesity, and pain.    ACTIVITY LIMITATIONS: bending, sitting, standing, squatting, sleeping, and stairs   PARTICIPATION LIMITATIONS: meal prep, cleaning, laundry, shopping, and community activity   PERSONAL FACTORS: 3+ comorbidities: see above  are also affecting patient's functional outcome.    REHAB POTENTIAL: Good   CLINICAL DECISION MAKING: Stable/uncomplicated   EVALUATION COMPLEXITY: Low     GOALS: Goals reviewed with patient? Yes   SHORT TERM GOALS: (target date for Short term goals are 4 weeks 12/31/21)    1.  Patient will demonstrate independent use of home exercise program to maintain progress  from in clinic treatments.   Goal status: New   LONG TERM GOALS: (target dates for all long term goals are 12 weeks  02/25/22 )   1. Patient will demonstrate/report pain at worst less than or equal to 2/10 to facilitate minimal limitation in daily activity secondary to pain symptoms.   Goal status: New   2. Patient will demonstrate independent use of home exercise program to facilitate ability to maintain/progress functional gains from skilled physical therapy services.   Goal status: New   3. Patient will demonstrate FOTO outcome > or = 61 % to indicate reduced disability due to condition.   Goal status: New   4.  Patient will demonstrate left  LE MMT >/= 4/5 throughout to faciltiate usual transfers, stairs, squatting at [facility] for daily life.    Goal status: New   5.  Patient will demonstrate Left active knee flexion to >/= 115 degrees   Goal status: New   6.  Pt will be able to navigate community surfaces >/= 20 minutes including curb step with LRAD.    Goal status: New       PLAN:   PT FREQUENCY: 2x/week   PT DURATION: 12 weeks   PLANNED INTERVENTIONS: Therapeutic exercises, Therapeutic activity, Neuro Muscular re-education, Balance training, Gait training, Patient/Family education, Joint mobilization, Stair training, DME instructions, Dry Needling, Electrical stimulation, Traction, Cryotherapy, vasopneumatic deviceMoist heat, Taping, Ultrasound, Ionotophoresis 49m/ml Dexamethasone, and Manual therapy.  All included unless contraindicated   PLAN FOR NEXT SESSION:  nustep vs bike due to limited Rt knee flexion ROM, quad strengthening, gait training, step ups, Manual therapy for ROM, try not to work the Rt leg too much due to pain in it  BDebbe Odea PT,DPT 12/10/2021, 10:49 AM

## 2021-12-13 ENCOUNTER — Ambulatory Visit (INDEPENDENT_AMBULATORY_CARE_PROVIDER_SITE_OTHER): Payer: Medicare Other | Admitting: Physical Therapy

## 2021-12-13 ENCOUNTER — Encounter: Payer: Self-pay | Admitting: Physical Therapy

## 2021-12-13 DIAGNOSIS — M25562 Pain in left knee: Secondary | ICD-10-CM | POA: Diagnosis not present

## 2021-12-13 DIAGNOSIS — I10 Essential (primary) hypertension: Secondary | ICD-10-CM | POA: Diagnosis not present

## 2021-12-13 DIAGNOSIS — C9000 Multiple myeloma not having achieved remission: Secondary | ICD-10-CM | POA: Diagnosis not present

## 2021-12-13 DIAGNOSIS — R262 Difficulty in walking, not elsewhere classified: Secondary | ICD-10-CM | POA: Diagnosis not present

## 2021-12-13 NOTE — Therapy (Signed)
OUTPATIENT PHYSICAL THERAPY TREATMENT NOTE   Patient Name: Diane Daugherty MRN: [id number] DOB:[birth year], 81 y.o., female Today's Date: 12/13/2021   END OF SESSION:   PT End of Session - 12/13/21 1430     Visit Number 4    Number of Visits 24    Date for PT Re-Evaluation 02/25/22    Progress Note Due on Visit 10    PT Start Time 1345    PT Stop Time 1425    PT Time Calculation (min) 40 min    Activity Tolerance Patient tolerated treatment well;Patient limited by pain    Behavior During Therapy [facility] for tasks assessed/performed             Past Medical History:  Diagnosis Date   Anemia 09/24/1968   "after childbirth"   Arthritis    "feels like all over"   Carpal tunnel syndrome    Carpal tunnel syndrome    left   Cataract    immature on right   Chronic bronchitis ([place])    "get it most years" (10/08/2014)   Chronic kidney disease    stage III, per Pt   Family history of anesthesia complication    daughter gets sick   History of blood transfusion    "w/one of my back OR's"   Hypertension    takes Hyzaar daily   Joint pain    "all over"   Joint swelling    Muscle spasms of lower extremity    takes Baclofen daily prn   Nocturia    OSA on CPAP    Peripheral vascular disease (HCC)    PFO (patent foramen ovale)    positive saline bubble study 05/12/09 ([place])   Pneumonia 2010/ 2011   PONV (postoperative nausea and vomiting)    woke up during hysterectomy   Shortness of breath    with exertion;Albuterol prn   Urinary frequency    Past Surgical History:  Procedure Laterality Date   ABDOMINAL HYSTERECTOMY  1970's?   APPENDECTOMY     BACK SURGERY     BREAST BIOPSY Right 59's   [place]; 1963; [place]  80's   d/t pneumothorax   COLONOSCOPY     FRACTURE SURGERY     HARDWARE REMOVAL Right 1990's   "took screw out of my lower leg ~ 1 yr after putting it in"   HEMILAMINOTOMY LUMBAR SPINE  02/2002   L5; decompression of  the L5 and S1 nerve root; synovial cyst excision/notes 06/09/2010   KNEE ARTHROSCOPY Right ?2013   LUMBAR LAMINECTOMY/DECOMPRESSION MICRODISCECTOMY  02/2010   POSTERIOR LUMBAR FUSION  05/2003   L4-5 and S-1    TIBIA FRACTURE SURGERY Right 1990's   "put screw in"   TOTAL KNEE ARTHROPLASTY Right 08/28/2012   Procedure: TOTAL KNEE ARTHROPLASTY;  Surgeon: Garald Balding, MD;  Location: [place];  Service: Orthopedics;  Laterality: Right;   TOTAL KNEE ARTHROPLASTY Left 10/07/2014   TOTAL KNEE ARTHROPLASTY Left 10/07/2014   Procedure: TOTAL KNEE ARTHROPLASTY;  Surgeon: Garald Balding, MD;  Location: [place];  Service: Orthopedics;  Laterality: Left;   TOTAL KNEE REVISION Left 10/08/2021   Procedure: LEFT TOTAL KNEE REVISION;  Surgeon: Mcarthur Rossetti, MD;  [facility];  Service: Orthopedics;  Laterality: Left;   Patient Active Problem List   Diagnosis Date Noted   Status post revision of total replacement of left knee 10/08/2021   Failed total left knee replacement (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   Stage 3a chronic kidney disease ([place]) 05/27/2021   History of multiple myeloma 05/27/2021   Primary osteoarthritis of left knee 10/07/2014   History of left knee replacement 10/07/2014   Asthma, chronic 08/30/2012   Essential hypertension 08/30/2012   Osteoarthritis of right knee 08/30/2012   Sleep apnea 08/30/2012   Severe obesity (BMI >= 40) ([place]) 08/30/2012     THERAPY DIAG:  Acute pain of left knee  Difficulty in walking, not elsewhere classified  PCP: Horald Pollen, MD     REFERRING PROVIDER: Mcarthur Rossetti, MD   REFERRING DIAG: [phone] (ICD-10-CM) - Status post revision of total replacement of left knee  Rationale for Evaluation and Treatment: Rehabilitation   ONSET DATE: 10/08/21 left TKA revision   SUBJECTIVE:    SUBJECTIVE STATEMENT: Pt arriving reporting soreness following her last visit. Pt stating pain of 5/10 in left  knee at times.    PERTINENT HISTORY:  arthritis, anemia, CKD, HTN, muscle spasms in LE's, back surgery, hemilaminotomy lumbar spine 2004, TKA on Rt 2014, tibia fx 1990's, hardward removal 1990's one year after placement, left TKA 2016, CA PAIN:  NPRS scale: 5/10 Pain location: left knee Pain description: achy,  Aggravating factors: bending, standing prolong Relieving factors: ice, elevation, over the counter pain meds   PRECAUTIONS: None   WEIGHT BEARING RESTRICTIONS: No   FALLS:  Has patient fallen in last 6 months? 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 No perihardware lucency is seen to indicate hardware failure or loosening. Expected postoperative changes including mild intra-articular fluid and air and mild anterior soft tissue swelling. There are anterior surgical skin staples. No acute fracture or dislocation.     PATIENT SURVEYS:  12/01/21 FOTO intake:  54 %   COGNITION: Overall cognitive status: WFL                  SENSATION: 12/01/21:  numbness on lateral lower incision   EDEMA:  12/01/21: Circumferential: Rt: 49. 5 centimeters                               Left 52 centimeters     POSTURE:  12/01/21: rounded shoulders, forward head, and decreased lumbar lordosis   PALPATION: 12/01/21 Tenderness noted along incision site   LOWER EXTREMITY ROM:    ROM Right 12/01/21 Left 12/01/21 Left 12/13/21  Hip flexion       Hip  extension       Hip abduction       Hip adduction       Hip internal rotation       Hip external rotation       Knee flexion A: 76 P: 80   A: 96 P: 100   A: 100 P: 105  Knee extension A: 0   A: -5 P: 0 A: -2 P: 0   (Blank rows =  not tested)   LOWER EXTREMITY MMT:   MMT Right eval Left eval  Hip flexion 4/5 4/5  Hip extension      Hip abduction 4/5 4/5  Hip adduction 4/5 4/5  Knee flexion 4/5 4/5  Knee extension 4/5 4/5  Ankle dorsiflexion      Ankle plantarflexion       (Blank rows = not tested)     FUNCTIONAL TESTS:  12/01/21:  19 seconds with no UE support 5 time sit to stand:    GAIT: Distance walked: 40 feet  Assistive device utilized: Single point cane Level of assistance: Modified independence Comments: antalgic gait step through pattern with decreased knee flexion in the Rt, decreased heel strike     TODAY'S TREATMENT                                                                          DATE: 12/13/21:  There Ex:  Nustep: Level 5 x 5 minutes Sit to stand: 2 x 10 using UE support Calf stretch in lunge position with towel under mid foot x 2 each LE holding 20 seconds Heel raises x 15 c UE support Leg Press: 31# 2 x 10 left LE only (sled at #2) Step ups on 6 inch step single UE support 2 x 10 Manual  Knee flexion in sitting c IR  Supine knee extension c overpressure  12/10/21 Recumbent bike L1 X 5 min rocking for ROM (more limited by Rt knee stiffness than left) Seated LAQ 2# 2X10 Leg press left leg only 25# 2X10 Step ups onto 6 inch step X 10 fwd, and X 10 lateral leading with left, bilat UE support in bars Slantboard stretch 30 sec X 3  -Manual: supine manual hamstring stretching, supine left knee PROM with flexion emphasis with overpressure and gentle flexion mobs grade 1-2  -Vasopnuematic declined, states she will ice at home.  12/08/21 Nu step L5 X 6 min Seated LAQ with 3 sec hold X 15 Seated SLR 2X10 Sit to stand X 10 without UE  support Seated hamstring curl X 15 with red band Step ups onto 6 inch step X 10 leading with left, one UE support  -Manual: supine manual hamstring stretching, supine left knee PROM with flexion emphasis with overpressure and gentle flexion mobs grade 1-2  -Vasopnuematic device X 10 min, medium compression, 34 deg to Lt knee    Therex:    HEP instruction/performance c cues for techniques, handout provided.  Trial set performed of each for comprehension and symptom assessment.  See below for exercise list   PATIENT EDUCATION:  Education details: HEP, POC Person educated: Patient Education method: Explanation, Demonstration, Verbal cues, and Handouts Education comprehension: verbalized understanding, returned demonstration, and verbal cues required   HOME EXERCISE PROGRAM: Access Code: [email or web address] URL: [email or web address] Date: 12/01/2021 Prepared by: Kearney Hard   Exercises - Sit to Stand with Counter Support  - 3-4 x daily - 7 x weekly - 2 sets - 10 reps - Seated Small Alternating Straight Leg Lifts with Heel Touch  - 3-4 x daily - 7 x weekly - 2 sets - 10 reps - Seated Long Arc Quad  - 3-4 x daily - 7 x weekly - 2  sets - 10 reps - 3 seconds hold - Supine Heel Slide with Strap  - 3-4 x daily - 7 x weekly - 10 reps - 5 seconds hold   ASSESSMENT:   CLINICAL IMPRESSION: Pt still presenting with limitations which are more caused by the pain in her Rt LE compared to her left. Pt's active arc of motion in her Left knee ranges from 2 - 100 degrees. Continue skilled PT to maximize pt's function.     OBJECTIVE IMPAIRMENTS: decreased activity tolerance, decreased balance, decreased mobility, difficulty walking, decreased ROM, increased edema, obesity, and pain.    ACTIVITY LIMITATIONS: bending, sitting, standing, squatting, sleeping, and stairs   PARTICIPATION LIMITATIONS: meal prep, cleaning, laundry, shopping, and community activity   PERSONAL FACTORS: 3+  comorbidities: see above  are also affecting patient's functional outcome.    REHAB POTENTIAL: Good   CLINICAL DECISION MAKING: Stable/uncomplicated   EVALUATION COMPLEXITY: Low     GOALS: Goals reviewed with patient? Yes   SHORT TERM GOALS: (target date for Short term goals are 4 weeks 12/31/21)    1.  Patient will demonstrate independent use of home exercise program to maintain progress from in clinic treatments.   Goal status: New   LONG TERM GOALS: (target dates for all long term goals are 12 weeks  02/25/22 )   1. Patient will demonstrate/report pain at worst less than or equal to 2/10 to facilitate minimal limitation in daily activity secondary to pain symptoms.   Goal status: New   2. Patient will demonstrate independent use of home exercise program to facilitate ability to maintain/progress functional gains from skilled physical therapy services.   Goal status: New   3. Patient will demonstrate FOTO outcome > or = 61 % to indicate reduced disability due to condition.   Goal status: New   4.  Patient will demonstrate left  LE MMT >/= 4/5 throughout to faciltiate usual transfers, stairs, squatting at [facility] for daily life.    Goal status: New   5.  Patient will demonstrate Left active knee flexion to >/= 115 degrees   Goal status: New   6.  Pt will be able to navigate community surfaces >/= 20 minutes including curb step with LRAD.    Goal status: New       PLAN:   PT FREQUENCY: 2x/week   PT DURATION: 12 weeks   PLANNED INTERVENTIONS: Therapeutic exercises, Therapeutic activity, Neuro Muscular re-education, Balance training, Gait training, Patient/Family education, Joint mobilization, Stair training, DME instructions, Dry Needling, Electrical stimulation, Traction, Cryotherapy, vasopneumatic deviceMoist heat, Taping, Ultrasound, Ionotophoresis 25m/ml Dexamethasone, and Manual therapy.  All included unless contraindicated   PLAN FOR NEXT SESSION:  nustep  due to  limited Rt knee flexion ROM, quad strengthening, gait training, step ups, Manual therapy for ROM, try not to work the Rt leg too much due to pain in it  JKearney Hard PT, MPT 12/13/21 2:31 PM   12/13/2021, 2:31 PM

## 2021-12-20 ENCOUNTER — Encounter: Payer: Self-pay | Admitting: Orthopaedic Surgery

## 2021-12-20 ENCOUNTER — Ambulatory Visit (INDEPENDENT_AMBULATORY_CARE_PROVIDER_SITE_OTHER): Payer: Medicare Other | Admitting: Rehabilitative and Restorative Service Providers"

## 2021-12-20 ENCOUNTER — Ambulatory Visit (INDEPENDENT_AMBULATORY_CARE_PROVIDER_SITE_OTHER): Payer: Medicare Other | Admitting: Orthopaedic Surgery

## 2021-12-20 ENCOUNTER — Encounter: Payer: Self-pay | Admitting: Rehabilitative and Restorative Service Providers"

## 2021-12-20 DIAGNOSIS — M25562 Pain in left knee: Secondary | ICD-10-CM | POA: Diagnosis not present

## 2021-12-20 DIAGNOSIS — R600 Localized edema: Secondary | ICD-10-CM

## 2021-12-20 DIAGNOSIS — R262 Difficulty in walking, not elsewhere classified: Secondary | ICD-10-CM

## 2021-12-20 DIAGNOSIS — M25662 Stiffness of left knee, not elsewhere classified: Secondary | ICD-10-CM

## 2021-12-20 DIAGNOSIS — Z96652 Presence of left artificial knee joint: Secondary | ICD-10-CM

## 2021-12-20 NOTE — Therapy (Signed)
OUTPATIENT PHYSICAL THERAPY TREATMENT NOTE [place]   Patient Name: Diane Daugherty MRN: [id number] DOB:[birth year], 81 y.o., female Today's Date: 12/20/2021   PHYSICAL THERAPY DISCHARGE SUMMARY  Visits from Start of Care: 5  Current functional level related to goals / functional outcomes: See note   Remaining deficits: See note   Education / Equipment: HEP  Patient goals were  mostly met . Patient is being discharged due to being pleased with the current functional level.      END OF SESSION:   PT End of Session - 12/20/21 1042     Visit Number 5    Number of Visits 24    Date for PT Re-Evaluation 02/25/22    Progress Note Due on Visit 10    PT Start Time 1042    PT Stop Time 1112    PT Time Calculation (min) 30 min    Activity Tolerance Patient tolerated treatment well    Behavior During Therapy WFL for tasks assessed/performed              Past Medical History:  Diagnosis Date   Anemia 09/24/1968   "after childbirth"   Arthritis    "feels like all over"   Carpal tunnel syndrome    Carpal tunnel syndrome    left   Cataract    immature on right   Chronic bronchitis ([place])    "get it most years" (10/08/2014)   Chronic kidney disease    stage III, per Pt   Family history of anesthesia complication    daughter gets sick   History of blood transfusion    "w/one of my back OR's"   Hypertension    takes Hyzaar daily   Joint pain    "all over"   Joint swelling    Muscle spasms of lower extremity    takes Baclofen daily prn   Nocturia    OSA on CPAP    Peripheral vascular disease (HCC)    PFO (patent foramen ovale)    positive saline bubble study 05/12/09 ([place])   Pneumonia 2010/ 2011   PONV (postoperative nausea and vomiting)    woke up during hysterectomy   Shortness of breath    with exertion;Albuterol prn   Urinary frequency    Past Surgical History:  Procedure Laterality Date   ABDOMINAL HYSTERECTOMY  1970's?   APPENDECTOMY      BACK SURGERY     BREAST BIOPSY Right 99's   [place]; 1963; [place]  80's   d/t pneumothorax   COLONOSCOPY     FRACTURE SURGERY     HARDWARE REMOVAL Right 1990's   "took screw out of my lower leg ~ 1 yr after putting it in"   HEMILAMINOTOMY LUMBAR SPINE  02/2002   L5; decompression of the L5 and S1 nerve root; synovial cyst excision/notes 06/09/2010   KNEE ARTHROSCOPY Right ?2013   LUMBAR LAMINECTOMY/DECOMPRESSION MICRODISCECTOMY  02/2010   POSTERIOR LUMBAR FUSION  05/2003   L4-5 and S-1    TIBIA FRACTURE SURGERY Right 1990's   "put screw in"   TOTAL KNEE ARTHROPLASTY Right 08/28/2012   Procedure: TOTAL KNEE ARTHROPLASTY;  Surgeon: Garald Balding, MD;  Location: [place];  Service: Orthopedics;  Laterality: Right;   TOTAL KNEE ARTHROPLASTY Left 10/07/2014   TOTAL KNEE ARTHROPLASTY Left 10/07/2014   Procedure: TOTAL KNEE ARTHROPLASTY;  Surgeon: Garald Balding, MD;  Location: [place];  Service: Orthopedics;  Laterality: Left;   TOTAL  KNEE REVISION Left 10/08/2021   Procedure: LEFT TOTAL KNEE REVISION;  Surgeon: Mcarthur Rossetti, MD;  [facility];  Service: Orthopedics;  Laterality: Left;   Patient Active Problem List   Diagnosis Date Noted   Status post revision of total replacement of left knee 10/08/2021   Failed total left knee replacement ([place]) 10/07/2021   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   Stage 3a chronic kidney disease ([place]) 05/27/2021   History of multiple myeloma 05/27/2021   Primary osteoarthritis of left knee 10/07/2014   History of left knee replacement 10/07/2014   Asthma, chronic 08/30/2012   Essential hypertension 08/30/2012   Osteoarthritis of right knee 08/30/2012   Sleep apnea 08/30/2012   Severe obesity (BMI >= 40) ([place]) 08/30/2012     THERAPY DIAG:  Acute pain of left knee  Difficulty in walking, not elsewhere classified  Stiffness of left knee, not elsewhere classified  Localized edema  PCP: Horald Pollen, MD     REFERRING PROVIDER: Mcarthur Rossetti, MD   REFERRING DIAG: [phone] (ICD-10-CM) - Status post revision of total replacement of left knee  Rationale for Evaluation and Treatment: Rehabilitation   ONSET DATE: 10/08/21 left TKA revision   SUBJECTIVE:    SUBJECTIVE STATEMENT: Pt indicated seeing MD this morning and he "cut her loose from therapy."  She indicated that after therapy visits she has back/hip problems but doesn't feel that when she does the HEP.   Pt indicated cold days can be harder.  Pt indicated doing normal stuff at home.  No cane use at home, only in public.    PERTINENT HISTORY:  arthritis, anemia, CKD, HTN, muscle spasms in LE's, back surgery, hemilaminotomy lumbar spine 2004, TKA on Rt 2014, tibia fx 1990's, hardward removal 1990's one year after placement, left TKA 2016, CA PAIN:  NPRS scale: 4/10 Pain location: left knee Pain description: achy,  Aggravating factors: bending, standing prolong Relieving factors: ice, elevation, over the counter pain meds   PRECAUTIONS: None   WEIGHT BEARING RESTRICTIONS: No   FALLS:  Has patient fallen in last 6 months? 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 No perihardware lucency is seen to indicate hardware failure or loosening. Expected postoperative changes including mild intra-articular fluid and air and mild anterior soft tissue swelling. There are  anterior surgical skin staples. No acute fracture or dislocation.     PATIENT SURVEYS:  12/20/2021  : FOTO update  64 %  12/01/21 FOTO intake:  54 %   COGNITION: 12/01/21 Overall cognitive status: WFL                  SENSATION: 12/01/21:  numbness on lateral lower incision   EDEMA:  12/01/21: Circumferential: Rt: 49. 5 centimeters                               Left 52 centimeters  POSTURE:  12/01/21: rounded shoulders, forward head, and decreased lumbar lordosis   PALPATION: 12/01/21 Tenderness noted along incision site   LOWER EXTREMITY ROM:    ROM Right 12/01/21 Left 12/01/21 Left 12/13/21 Left 12/20/2021  Hip flexion        Hip extension        Hip abduction        Hip adduction        Hip internal rotation        Hip external rotation        Knee flexion A: 76 P: 80   A: 96 P: 100   A: 100 P: 105 AROM in heel slide 105  Knee extension A: 0   A: -5 P: 0 A: -2 P: 0 AROM in seated LAQ 0   (Blank rows = not tested)   LOWER EXTREMITY MMT:   MMT Right 12/01/21 Left 12/01/21 Left 12/20/2021  Hip flexion 4/5 4/5 5/5  Hip extension       Hip abduction 4/5 4/5   Hip adduction 4/5 4/5   Knee flexion 4/5 4/5 5/5  Knee extension 4/5 4/5 5/5  Ankle dorsiflexion     5/5  Ankle plantarflexion        (Blank rows = not tested)     FUNCTIONAL TESTS:  12/01/21:  19 seconds with no UE support 5 time sit to stand:    GAIT: 12/01/21 Distance walked: 40 feet  Assistive device utilized: Single point cane Level of assistance: Modified independence Comments: antalgic gait step through pattern with decreased knee flexion in the Rt, decreased heel strike     TODAY'S TREATMENT                                                                          DATE: 12/20/2021: Lianne Moris: Nustep lvl 5 8 mins UE/LE Review of HEP c verbal and visual cues for techniques and frequency of use upon discharge.  Trial sets of each: Supine LAQ c supported hip flexion 90 deg x10 Lt  leg Seated LAQ x 5 Lt  Seated quad set Lt 5 sec hold x 5 Seated SLR x 5 Lt Sit to stand to sit 18 inch chair s UE assist x 5     12/13/21:  There Ex:  Nustep: Level 5 x 5 minutes Sit to stand: 2 x 10 using UE support Calf stretch in lunge position with towel under mid foot x 2 each LE holding 20 seconds Heel raises x 15 c UE support Leg Press: 31# 2 x 10 left LE only (sled at #2) Step ups on 6 inch step single UE support 2 x 10 Manual  Knee flexion in sitting c IR  Supine knee extension c overpressure  12/10/21 Recumbent bike L1 X 5 min rocking for ROM (more limited by Rt knee stiffness than left) Seated LAQ 2# 2X10 Leg press left leg only 25# 2X10 Step ups onto 6 inch step X 10 fwd, and X 10 lateral leading with left, bilat UE support in bars Slantboard stretch 30 sec X 3  -Manual: supine manual hamstring stretching, supine left knee PROM with flexion emphasis with overpressure and gentle flexion mobs grade 1-2  -Vasopnuematic declined, states she will  ice at home.    PATIENT EDUCATION:  12/20/2021 Education details: HEP update for discharge Person educated: Patient Education method: Explanation, Demonstration, Verbal cues, and Handouts Education comprehension: verbalized understanding, returned demonstration, and verbal cues required   HOME EXERCISE PROGRAM: Access Code: [email or web address] URL: [email or web address] Date: 12/20/2021 Prepared by: Scot Jun  Exercises - Seated Quad Set (Mirrored)  - 3-5 x daily - 7 x weekly - 1 sets - 10 reps - 5 hold - Seated Small Alternating Straight Leg Lifts with Heel Touch  - 1-2 x daily - 7 x weekly - 2 sets - 10 reps - Seated Long Arc Quad (Mirrored)  - 3-5 x daily - 7 x weekly - 1 sets - 10 reps - 3 seconds hold - Sit to Stand  - 3 x daily - 7 x weekly - 1 sets - 10 reps - Supine Active Knee Extension with Hand Support and Leg Straight (Mirrored)  - 1-2 x daily - 7 x weekly - 1 sets - 10 reps   ASSESSMENT:    CLINICAL IMPRESSION: Pt has attended 5 visits overall during course of treatment.  See objective data for updated information showing current presentation.  Pt has met most of established goals in relationship to Lt leg at this time. In agreement with patient request, reviewed HEP and and prepared for discharge to HEP only.     OBJECTIVE IMPAIRMENTS: decreased activity tolerance, decreased balance, decreased mobility, difficulty walking, decreased ROM, increased edema, obesity, and pain.    ACTIVITY LIMITATIONS: bending, sitting, standing, squatting, sleeping, and stairs   PARTICIPATION LIMITATIONS: meal prep, cleaning, laundry, shopping, and community activity   PERSONAL FACTORS: 3+ comorbidities: see above  are also affecting patient's functional outcome.    REHAB POTENTIAL: Good   CLINICAL DECISION MAKING: Stable/uncomplicated   EVALUATION COMPLEXITY: Low     GOALS: Goals reviewed with patient? Yes   SHORT TERM GOALS: (target date for Short term goals are 4 weeks 12/31/21)    1.  Patient will demonstrate independent use of home exercise program to maintain progress from in clinic treatments.   Goal status: Met 12/20/2021   LONG TERM GOALS: (target dates for all long term goals are 12 weeks  02/25/22 )   1. Patient will demonstrate/report pain at worst less than or equal to 2/10 to facilitate minimal limitation in daily activity secondary to pain symptoms.   Goal status: partially met 12/20/2021   2. Patient will demonstrate independent use of home exercise program to facilitate ability to maintain/progress functional gains from skilled physical therapy services.   Goal status: Met 12/20/2021   3. Patient will demonstrate FOTO outcome > or = 61 % to indicate reduced disability due to condition.   Goal status: Met 12/20/2021   4.  Patient will demonstrate left  LE MMT >/= 4/5 throughout to faciltiate usual transfers, stairs, squatting at [facility] for daily life.    Goal status:  Met 12/20/2021   5.  Patient will demonstrate Left active knee flexion to >/= 115 degrees   Goal status: Met 12/20/2021   6.  Pt will be able to navigate community surfaces >/= 20 minutes including curb step with LRAD.    Goal status: Met 12/20/2021       PLAN:   PT FREQUENCY: 2x/week   PT DURATION: 12 weeks   PLANNED INTERVENTIONS: Therapeutic exercises, Therapeutic activity, Neuro Muscular re-education, Balance training, Gait training, Patient/Family education, Joint mobilization, Stair training, DME instructions, Dry Needling, Electrical stimulation, Traction, Cryotherapy, vasopneumatic deviceMoist  heat, Taping, Ultrasound, Ionotophoresis 31m/ml Dexamethasone, and Manual therapy.  All included unless contraindicated   PLAN FOR NEXT SESSION:  Discharge    MScot Jun PT, DPT, OCS, ATC 12/20/21  11:19 AM

## 2021-12-20 NOTE — Progress Notes (Signed)
The patient is now 10 weeks status post a left total knee revision arthroplasty.  She reports that she is doing well with therapy and feels like she can do this more on her own.  She says the days of therapy she goes home and her hip and back hurt.  She is and with a cane and feels like she is doing really well.  She is scheduled for therapy through December 13.  On exam her left revision knee looks good.  She has pretty good flexion and extension and overall she is mobilizing well for someone who is 81.  I am fine with her transitioning to a home exercise program after today's visit.  The next event is he is not for 3 months.  Will have a standing AP and lateral of her left knee at that visit.  All question concerns were answered and addressed.  If there are issues before then she knows to let us know.

## 2021-12-21 ENCOUNTER — Ambulatory Visit: Payer: Self-pay

## 2021-12-21 NOTE — Patient Instructions (Signed)
Visit Information  Thank you for taking time to visit with me today. Please don't hesitate to contact me if I can be of assistance to you.   Following are the goals we discussed today:   Goals Addressed             This Visit's Progress    COMPLETED: Continue to improve post rehab       Care Coordination Interventions: Reviewed upcoming appointments Encouraged to continue to take medications as scheduled Encouraged patient to contact provider for health questions or concerns RNCM encouraged patient to contact RNCM or primary care provider if needs change.      If you are experiencing a Mental Health or [place] or need someone to talk to, please call the Suicide and Crisis Lifeline: 988  Patient verbalizes understanding of instructions and care plan provided today and agrees to view in [place]. Active MyChart status and patient understanding of how to access instructions and care plan via MyChart confirmed with patient.     Thea Silversmith, RN, MSN, BSN, [place] Coordinator [phone]

## 2021-12-21 NOTE — Patient Outreach (Signed)
  Care Coordination   Follow Up Visit Note   12/21/2021 Name: CASMIRA CRAMER MRN: [id number] DOB: 17-Jul-1940  KARISTA AISPURO is a 81 y.o. year old female who sees Sagardia, Ines Bloomer, MD for primary care. I spoke with  [name] by phone today.  What matters to the patients health and wellness today?  She reports she is doing well since discharge from rehab. She reports that some of the exercises she was doing per Physical therapy was aggravating her back some, but she states that has improved also. Ms. Gloeckner reports she saw orthopedic provider on yesterday and has been released from outpatient therapy. She states she continues to do exercises at home that have been recommended. Ms. Jolin states she has an appointment with her nephrologist on tomorrow. Denies any questions or concerns at this time. She is agreeable to case closure.   Goals Addressed             This Visit's Progress    COMPLETED: Continue to improve post rehab       Care Coordination Interventions: Reviewed upcoming appointments Encouraged to continue to take medications as scheduled Encouraged patient to contact provider for health questions or concerns RNCM encouraged patient to contact RNCM or primary care provider if needs change.      SDOH assessments and interventions completed:  No  Care Coordination Interventions:  Yes, provided   Follow up plan: No further intervention required.   Encounter Outcome:  Pt. Visit Completed   Thea Silversmith, RN, MSN, BSN, [place] Coordinator [phone]

## 2021-12-22 ENCOUNTER — Encounter: Payer: Medicare Other | Admitting: Physical Therapy

## 2021-12-22 DIAGNOSIS — N390 Urinary tract infection, site not specified: Secondary | ICD-10-CM | POA: Diagnosis not present

## 2021-12-22 DIAGNOSIS — D472 Monoclonal gammopathy: Secondary | ICD-10-CM | POA: Diagnosis not present

## 2021-12-22 DIAGNOSIS — I129 Hypertensive chronic kidney disease with stage 1 through stage 4 chronic kidney disease, or unspecified chronic kidney disease: Secondary | ICD-10-CM | POA: Diagnosis not present

## 2021-12-22 DIAGNOSIS — N1831 Chronic kidney disease, stage 3a: Secondary | ICD-10-CM | POA: Diagnosis not present

## 2021-12-22 DIAGNOSIS — M1712 Unilateral primary osteoarthritis, left knee: Secondary | ICD-10-CM | POA: Diagnosis not present

## 2021-12-27 ENCOUNTER — Encounter: Payer: Medicare Other | Admitting: Physical Therapy

## 2021-12-29 ENCOUNTER — Encounter: Payer: Medicare Other | Admitting: Physical Therapy

## 2022-01-03 ENCOUNTER — Encounter: Payer: Medicare Other | Admitting: Physical Therapy

## 2022-01-05 ENCOUNTER — Encounter: Payer: Medicare Other | Admitting: Physical Therapy

## 2022-01-20 DIAGNOSIS — I10 Essential (primary) hypertension: Secondary | ICD-10-CM | POA: Diagnosis not present

## 2022-01-20 DIAGNOSIS — C9000 Multiple myeloma not having achieved remission: Secondary | ICD-10-CM | POA: Diagnosis not present

## 2022-01-28 DIAGNOSIS — I10 Essential (primary) hypertension: Secondary | ICD-10-CM | POA: Diagnosis not present

## 2022-01-28 DIAGNOSIS — C9000 Multiple myeloma not having achieved remission: Secondary | ICD-10-CM | POA: Diagnosis not present

## 2022-02-10 ENCOUNTER — Inpatient Hospital Stay: Payer: Medicare Other | Attending: Hematology and Oncology | Admitting: Hematology and Oncology

## 2022-02-10 ENCOUNTER — Encounter: Payer: Self-pay | Admitting: Hematology and Oncology

## 2022-02-10 ENCOUNTER — Inpatient Hospital Stay: Payer: Medicare Other

## 2022-02-10 VITALS — BP 163/79 | HR 95 | Temp 97.9°F | Resp 18 | Wt 205.5 lb

## 2022-02-10 DIAGNOSIS — Z79899 Other long term (current) drug therapy: Secondary | ICD-10-CM | POA: Diagnosis not present

## 2022-02-10 DIAGNOSIS — M25569 Pain in unspecified knee: Secondary | ICD-10-CM | POA: Diagnosis not present

## 2022-02-10 DIAGNOSIS — D472 Monoclonal gammopathy: Secondary | ICD-10-CM

## 2022-02-10 DIAGNOSIS — C9000 Multiple myeloma not having achieved remission: Secondary | ICD-10-CM | POA: Diagnosis not present

## 2022-02-10 LAB — CBC WITH DIFFERENTIAL (CANCER CENTER ONLY)
Abs Immature Granulocytes: 0.01 10*3/uL (ref 0.00–0.07)
Basophils Absolute: 0 10*3/uL (ref 0.0–0.1)
Basophils Relative: 1 %
Eosinophils Absolute: 0.1 10*3/uL (ref 0.0–0.5)
Eosinophils Relative: 2 %
HCT: 35.1 % — ABNORMAL LOW (ref 36.0–46.0)
Hemoglobin: 12.3 g/dL (ref 12.0–15.0)
Immature Granulocytes: 0 %
Lymphocytes Relative: 42 %
Lymphs Abs: 2.8 10*3/uL (ref 0.7–4.0)
MCH: 31.1 pg (ref 26.0–34.0)
MCHC: 35 g/dL (ref 30.0–36.0)
MCV: 88.9 fL (ref 80.0–100.0)
Monocytes Absolute: 0.4 10*3/uL (ref 0.1–1.0)
Monocytes Relative: 5 %
Neutro Abs: 3.4 10*3/uL (ref 1.7–7.7)
Neutrophils Relative %: 50 %
Platelet Count: 218 10*3/uL (ref 150–400)
RBC: 3.95 MIL/uL (ref 3.87–5.11)
RDW: 15.9 % — ABNORMAL HIGH (ref 11.5–15.5)
WBC Count: 6.8 10*3/uL (ref 4.0–10.5)
nRBC: 0 % (ref 0.0–0.2)

## 2022-02-10 LAB — CMP (CANCER CENTER ONLY)
ALT: 10 U/L (ref 0–44)
AST: 16 U/L (ref 15–41)
Albumin: 4.2 g/dL (ref 3.5–5.0)
Alkaline Phosphatase: 68 U/L (ref 38–126)
Anion gap: 6 (ref 5–15)
BUN: 14 mg/dL (ref 8–23)
CO2: 29 mmol/L (ref 22–32)
Calcium: 10.2 mg/dL (ref 8.9–10.3)
Chloride: 101 mmol/L (ref 98–111)
Creatinine: 1.26 mg/dL — ABNORMAL HIGH (ref 0.44–1.00)
GFR, Estimated: 43 mL/min — ABNORMAL LOW (ref >60)
Glucose, Bld: 85 mg/dL (ref 70–99)
Potassium: 4 mmol/L (ref 3.5–5.1)
Sodium: 136 mmol/L (ref 135–145)
Total Bilirubin: 0.5 mg/dL (ref 0.3–1.2)
Total Protein: 8.3 g/dL — ABNORMAL HIGH (ref 6.5–8.1)

## 2022-02-10 LAB — LACTATE DEHYDROGENASE: LDH: 135 U/L (ref 98–192)

## 2022-02-10 NOTE — Progress Notes (Signed)
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 Lorenso Courier. Kappa 77.3, Lambda 6.6, ratio 11.71.  09/25/2020: met survey shows no clear lytic lesions 10/01/2020: BmBx showed mildly hypercellular marrow involved by kappa-restricted plasma cell neoplasm (20%)  Interval History:  Diane Daugherty 82 y.o. female with medical history significant for smoldering multiple myeloma who presents for a follow up visit. The patient's last visit was on 02/05/2021. In the interim since the last visit she has had no major changes in her health.  On exam today [name] notes she has been well overall in the last 6 months.  She reports unfortunately 1 week ago she began developing a pain in her right side.  She notes that if she turns or twists a certain way she gets a grabbing pain on her right side.  She notes that she has been feeling cold and her energy levels have been low.  Fortunately though her weight has been steady, only down about 4 pounds in the interim since our last visit but her appetite has been good.  She notes that she is concerned her blood pressure is elevated 171/79 today despite the fact she is taking both valsartan and amlodipine.  She reports that she has had no other major changes in her health.  She has no new bone or back pain.  She currently denies any fevers, chills, sweats, nausea, vomiting or diarrhea.  A full 10 point ROS is listed below.  MEDICAL HISTORY:  Past Medical History:  Diagnosis Date   Anemia 09/24/1968   "after childbirth"   Arthritis    "feels like all over"   Carpal tunnel syndrome    Carpal tunnel syndrome    left   Cataract    immature on right   Chronic bronchitis ([facility])     "get it most years" (10/08/2014)   Chronic kidney disease    stage III, per Pt   Family history of anesthesia complication    daughter gets sick   History of blood transfusion    "w/one of my back OR's"   Hypertension    takes Hyzaar daily   Joint pain    "all over"   Joint swelling    Muscle spasms of lower extremity    takes Baclofen daily prn   Nocturia    OSA on CPAP    Peripheral vascular disease (HCC)    PFO (patent foramen ovale)    positive saline bubble study 05/12/09 ([facility])   Pneumonia 2010/ 2011   PONV (postoperative nausea and vomiting)    woke up during hysterectomy   Shortness of breath    with exertion;Albuterol prn   Urinary frequency     SURGICAL HISTORY: Past Surgical History:  Procedure Laterality Date   ABDOMINAL HYSTERECTOMY  1970's?   APPENDECTOMY     BACK SURGERY     BREAST BIOPSY Right 6's   [place]; 1963; [place]  80's   d/t pneumothorax   COLONOSCOPY     FRACTURE SURGERY     HARDWARE REMOVAL Right 1990's   "took screw out of my lower leg ~ 1 yr after putting it in"   HEMILAMINOTOMY LUMBAR SPINE  02/2002   L5; decompression of the  L5 and S1 nerve root; synovial cyst excision/notes 06/09/2010   KNEE ARTHROSCOPY Right ?2013   LUMBAR LAMINECTOMY/DECOMPRESSION MICRODISCECTOMY  02/2010   POSTERIOR LUMBAR FUSION  05/2003   L4-5 and S-1    TIBIA FRACTURE SURGERY Right 1990's   "put screw in"   TOTAL KNEE ARTHROPLASTY Right 08/28/2012   Procedure: TOTAL KNEE ARTHROPLASTY;  Surgeon: Garald Balding, MD;  Location: [facility];  Service: Orthopedics;  Laterality: Right;   TOTAL KNEE ARTHROPLASTY Left 10/07/2014   TOTAL KNEE ARTHROPLASTY Left 10/07/2014   Procedure: TOTAL KNEE ARTHROPLASTY;  Surgeon: Garald Balding, MD;  Location: [facility];  Service: Orthopedics;  Laterality: Left;   TOTAL KNEE REVISION Left 10/08/2021   Procedure: LEFT TOTAL KNEE REVISION;  Surgeon: Mcarthur Rossetti, MD;  Location: [facility];   Service: Orthopedics;  Laterality: Left;    SOCIAL HISTORY: Social History   Socioeconomic History   Marital status: Divorced    Spouse name: Not on file   Number of children: Not on file   Years of education: Not on file   Highest education level: Not on file  Occupational History   Not on file  Tobacco Use   Smoking status: Never   Smokeless tobacco: Never  Vaping Use   Vaping Use: Never used  Substance and Sexual Activity   Alcohol use: No   Drug use: No   Sexual activity: Not Currently  Other Topics Concern   Not on file  Social History Narrative   Not on file   Social Determinants of Health   Financial Resource Strain: Not on file  Food Insecurity: No Food Insecurity (11/12/2021)   Hunger Vital Sign    Worried About Running Out of Food in the Last Year: Never true    Ran Out of Food in the Last Year: Never true  Transportation Needs: No Transportation Needs (11/12/2021)   PRAPARE - Hydrologist (Medical): No    Lack of Transportation (Non-Medical): No  Physical Activity: Not on file  Stress: Not on file  Social Connections: Not on file  Intimate Partner Violence: Not on file    FAMILY HISTORY: No family history on file.  ALLERGIES:  is allergic to levaquin [levofloxacin], morphine and related, other, oxycodone, and naprosyn [naproxen].  MEDICATIONS:  Current Outpatient Medications  Medication Sig Dispense Refill   acetaminophen (TYLENOL) 650 MG CR tablet Take 650-1,300 mg by mouth every 8 (eight) hours as needed for pain.     amLODipine (NORVASC) 5 MG tablet Take 1 tablet (5 mg total) by mouth daily. 90 tablet 3   apixaban (ELIQUIS) 2.5 MG TABS tablet Take 1 tablet (2.5 mg total) by mouth every 12 (twelve) hours. 20 tablet 0   baclofen (LIORESAL) 10 MG tablet Take 1 tablet (10 mg total) by mouth 3 (three) times daily as needed for muscle spasms. 20 tablet 0   Calcium Citrate-Vitamin D (CALCIUM CITRATE + D PO) Take 1 tablet by  mouth daily.     CHELATED IRON PO Take 27 mg by mouth daily.     fluticasone (FLONASE) 50 MCG/ACT nasal spray Place 2 sprays into both nostrils daily as needed for allergies or rhinitis. 16 g 1   HYDROmorphone (DILAUDID) 2 MG tablet Take 1 tablet (2 mg total) by mouth every 6 (six) hours as needed for severe pain or moderate pain. 30 tablet 0   Krill Oil 300 MG CAPS Take 300 mg by mouth daily.     Multiple Vitamins-Minerals (  CENTRUM SILVER 50+WOMEN PO) Take 1 tablet by mouth daily.     valsartan (DIOVAN) 160 MG tablet Take 1 tablet (160 mg total) by mouth daily. (Patient taking differently: Take 320 mg by mouth 2 (two) times daily.) 90 tablet 3   No current facility-administered medications for this visit.    REVIEW OF SYSTEMS:   Constitutional: ( - ) fevers, ( - )  chills , ( - ) night sweats Eyes: ( - ) blurriness of vision, ( - ) double vision, ( - ) watery eyes Ears, nose, mouth, throat, and face: ( - ) mucositis, ( - ) sore throat Respiratory: ( - ) cough, ( - ) dyspnea, ( - ) wheezes Cardiovascular: ( - ) palpitation, ( - ) chest discomfort, ( - ) lower extremity swelling Gastrointestinal:  ( - ) nausea, ( - ) heartburn, ( - ) change in bowel habits Skin: ( - ) abnormal skin rashes Lymphatics: ( - ) new lymphadenopathy, ( - ) easy bruising Neurological: ( - ) numbness, ( - ) tingling, ( - ) new weaknesses Behavioral/Psych: ( - ) mood change, ( - ) new changes  All other systems were reviewed with the patient and are negative.  PHYSICAL EXAMINATION:  Vitals:   02/10/22 1100 02/10/22 1119  BP: (!) 171/73 (!) 163/79  Pulse: 95   Resp: 18   Temp: 97.9 F (36.6 C)   SpO2: 100%     Filed Weights   02/10/22 1100  Weight: 205 lb 8 oz (93.2 kg)     GENERAL: Well-appearing elderly African-American female, alert, no distress and comfortable SKIN: skin color, texture, turgor are normal, no rashes or significant lesions EYES: conjunctiva are pink and non-injected, sclera  clear LUNGS: clear to auscultation and percussion with normal breathing effort HEART: regular rate & rhythm and no murmurs and no lower extremity edema Musculoskeletal: no cyanosis of digits and no clubbing  PSYCH: alert & oriented x 3, fluent speech NEURO: no focal motor/sensory deficits  LABORATORY DATA:  I have reviewed the data as listed    Latest Ref Rng & Units 02/10/2022   10:36 AM 10/29/2021   10:17 AM 10/09/2021    3:15 AM  CBC  WBC 4.0 - 10.5 K/uL 6.8  6.5  7.3   Hemoglobin 12.0 - 15.0 g/dL 12.3  10.0  9.9   Hematocrit 36.0 - 46.0 % 35.1  29.4  29.1   Platelets 150 - 400 K/uL 218  393  150        Latest Ref Rng & Units 02/10/2022   10:36 AM 10/29/2021   10:17 AM 10/09/2021    3:15 AM  CMP  Glucose 70 - 99 mg/dL 85  129  136   BUN 8 - 23 mg/dL '14  20  19   '$ Creatinine 0.44 - 1.00 mg/dL 1.26  1.15  1.12   Sodium 135 - 145 mmol/L 136  137  139   Potassium 3.5 - 5.1 mmol/L 4.0  4.0  4.8   Chloride 98 - 111 mmol/L 101  100  105   CO2 22 - 32 mmol/L 29  32  29   Calcium 8.9 - 10.3 mg/dL 10.2  10.1  8.9   Total Protein 6.5 - 8.1 g/dL 8.3  8.3    Total Bilirubin 0.3 - 1.2 mg/dL 0.5  0.5    Alkaline Phos 38 - 126 U/L 68  91    AST 15 - 41 U/L 16  15  ALT 0 - 44 U/L 10  11      Lab Results  Component Value Date   MPROTEIN 0.7 (H) 10/29/2021   MPROTEIN 1.3 (H) 08/05/2021   MPROTEIN 1.7 (H) 05/06/2021   Lab Results  Component Value Date   KPAFRELGTCHN 96.3 (H) 10/29/2021   KPAFRELGTCHN 78.0 (H) 08/05/2021   KPAFRELGTCHN 77.5 (H) 05/06/2021   LAMBDASER 10.0 10/29/2021   LAMBDASER 6.1 08/05/2021   LAMBDASER 5.0 (L) 05/06/2021   KAPLAMBRATIO 9.63 (H) 10/29/2021   KAPLAMBRATIO 0.97 (L) 08/09/2021   KAPLAMBRATIO 12.79 (H) 08/05/2021     RADIOGRAPHIC STUDIES: No results found.  ASSESSMENT & PLAN Diane Daugherty 82 y.o. female with medical history significant for smoldering multiple myeloma who presents for a follow up visit.  Previously we discussed the diagnosis  of smoldering multiple myeloma.  Smoldering multiple myeloma is a precursor condition which can lead to multiple myeloma.  The rate of conversion to multiple myeloma is 10 %/year.  Treatment can be considered appropriate and smoldering multiple myeloma cases that meet the 20-2-20 criteria.  This criteria includes 20% plasma cells on bone marrow biopsy, M protein greater than 2, and a serum free light chain ratio of greater than 20.  Currently the patient only meets 1 of these criteria.  As such I do believe that observation is appropriate.  We will plan to check labs on the patient in 3 months time for close monitoring of her blood counts. Clinic visits q 6 months or sooner if needed.   #Smoldering Multiple Myeloma -- The patient currently meets one of the three 20-2-20 criteria (bmbx with 20% plasma cells). Two criteria are required for treatment.  --labs today show Hgb 6.8, hemoglobin 12.3, MCV 88.9, and platelets of 218. -- No clear indication for treatment as long as the patient does not meet this criteria or any of the CRAB criteria --At each visit we will order SPEP, UPEP, serum free light chains, and LDH as well as CMP and CBC --Assure imaging of the bones at least once per year.  Scan is currently due.  --Recommend follow-up visit in 6 months time with interval 3 month labs.   #Knee Pain --per recommendations of bone scan will order MRI knee to further evaluate  #Healthcare Maintenance --patient requesting to establish with new PCP in [place]. Referred to [facility] primary care.   Orders Placed This Encounter  Procedures   DG Bone Survey Met    Standing Status:   Future    Standing Expiration Date:   02/11/2023    Order Specific Question:   Reason for Exam (SYMPTOM  OR DIAGNOSIS REQUIRED)    Answer:   smolder myeloma, asses for lytic lesions    Order Specific Question:   Preferred imaging location?    Answer:   [facility]   All questions were answered. The  patient knows to call the clinic with any problems, questions or concerns.  A total of more than 30 minutes were spent on this encounter with face-to-face time and non-face-to-face time, including preparing to see the patient, ordering tests and/or medications, counseling the patient and coordination of care as outlined above.   Ledell Peoples, MD Department of Hematology/Oncology [place] [facility] Phone: [phone] Pager: [phone] Email: [email or web address]  02/10/2022 4:48 PM

## 2022-02-11 LAB — KAPPA/LAMBDA LIGHT CHAINS
Kappa free light chain: 90.3 mg/L — ABNORMAL HIGH (ref 3.3–19.4)
Kappa, lambda light chain ratio: 9.21 — ABNORMAL HIGH (ref 0.26–1.65)
Lambda free light chains: 9.8 mg/L (ref 5.7–26.3)

## 2022-02-11 LAB — BETA 2 MICROGLOBULIN, SERUM: Beta-2 Microglobulin: 3.1 mg/L — ABNORMAL HIGH (ref 0.6–2.4)

## 2022-02-15 LAB — MULTIPLE MYELOMA PANEL, SERUM
Albumin SerPl Elph-Mcnc: 3.9 g/dL (ref 2.9–4.4)
Albumin/Glob SerPl: 1 (ref 0.7–1.7)
Alpha 1: 0.3 g/dL (ref 0.0–0.4)
Alpha2 Glob SerPl Elph-Mcnc: 0.8 g/dL (ref 0.4–1.0)
B-Globulin SerPl Elph-Mcnc: 0.9 g/dL (ref 0.7–1.3)
Gamma Glob SerPl Elph-Mcnc: 2.2 g/dL — ABNORMAL HIGH (ref 0.4–1.8)
Globulin, Total: 4.2 g/dL — ABNORMAL HIGH (ref 2.2–3.9)
IgA: 78 mg/dL (ref 64–422)
IgG (Immunoglobin G), Serum: 2364 mg/dL — ABNORMAL HIGH (ref 586–1602)
IgM (Immunoglobulin M), Srm: 20 mg/dL — ABNORMAL LOW (ref 26–217)
M Protein SerPl Elph-Mcnc: 1.7 g/dL — ABNORMAL HIGH
Total Protein ELP: 8.1 g/dL (ref 6.0–8.5)

## 2022-02-18 ENCOUNTER — Ambulatory Visit (HOSPITAL_COMMUNITY)
Admission: RE | Admit: 2022-02-18 | Discharge: 2022-02-18 | Disposition: A | Discharge status: Home / Self Care | Payer: Medicare Other | Source: Ambulatory Visit | Attending: Hematology and Oncology | Admitting: Hematology and Oncology

## 2022-02-18 DIAGNOSIS — D179 Benign lipomatous neoplasm, unspecified: Secondary | ICD-10-CM | POA: Diagnosis not present

## 2022-02-18 DIAGNOSIS — D472 Monoclonal gammopathy: Secondary | ICD-10-CM | POA: Diagnosis not present

## 2022-02-24 ENCOUNTER — Other Ambulatory Visit: Payer: Medicare Other

## 2022-02-25 NOTE — Patient Instructions (Signed)
Ms. Hinkle,  It was a pleasure speaking with you yesterday.  It sounds like you have a great grasp of you medications and are on top of monitoring your blood pressure at home.  I have notified Dr. Mitchel Honour of our telephone appointment yesterday in light of your upcoming appointment with him.  Continue check those blood pressures regularly- daily if possible; and let us know if you were to see numbers over 140/90 consistently.  Keep up the great work!  Have a wonderful weekend,  Darlina Guys, PharmD, DPLA

## 2022-02-25 NOTE — Progress Notes (Signed)
02/25/2022 Name: Diane Daugherty MRN: [id number] DOB: 08/01/1940  Chief Complaint  Patient presents with   Hypertension    Diane Daugherty is a 82 y.o. year old female who presented for a telephone visit.   They were referred to the pharmacist by a quality report for assistance in managing hypertension.   Patient is participating in a Managed Medicaid Plan:  No  Subjective: Patient identified in quality report as part of TNM: HTN in Black or African American with BP >140/90.   Care Team: Primary Care Provider: Horald Pollen, MD ; Next Scheduled Visit: 02/28/22  Medication Access/Adherence Patient reports affordability concerns with their medications: No  Patient reports access/transportation concerns to their pharmacy: No  Patient reports adherence concerns with their medications:  No    Hypertension: Current medications: Amlodipine '10mg'$  daily, Valsartan '320mg'$  daily  Patient has a validated, automated, upper arm home BP cuff Current blood pressure readings readings: 130/70  Patient denies hypotensive s/sx including dizziness, lightheadedness.  Patient denies hypertensive symptoms including headache, chest pain, shortness of breath  Objective: Lab Results  Component Value Date   CREATININE 1.26 (H) 02/10/2022   BUN 14 02/10/2022   NA 136 02/10/2022   K 4.0 02/10/2022   CL 101 02/10/2022   CO2 29 02/10/2022   Medications Reviewed Today     Reviewed by Darlina Guys, [place] (Pharmacist) on 02/24/22 at 1509  Med List Status: <None>   Medication Order Taking? Sig Documenting Provider Last Dose Status Informant  acetaminophen (TYLENOL) 650 MG CR tablet [id number] Yes Take 650-1,300 mg by mouth every 8 (eight) hours as needed for pain. [provider] Taking Active Self           Med Note Colin Rhein, Twisha Vanpelt A   Thu Feb 24, 2022  3:07 PM) Takes 2 tabs in the morning every day  amLODipine (NORVASC) 5 MG tablet [id number] Yes Take 1 tablet (5 mg total) by mouth daily.  Horald Pollen, MD Taking Active Self  baclofen (LIORESAL) 10 MG tablet [id number] Yes Take 1 tablet (10 mg total) by mouth 3 (three) times daily as needed for muscle spasms. Mcarthur Rossetti, MD Taking Active            Med Note Colin Rhein, [place] A   Thu Feb 24, 2022  3:06 PM) Taking about 1 once every couple of months  Calcium Citrate-Vitamin D (CALCIUM CITRATE + D PO) [id number] Yes Take 1 tablet by mouth daily. [provider] Taking Active Self  CHELATED IRON PO [id number] Yes Take 27 mg by mouth daily. [provider] Taking Active Self  fluticasone (FLONASE) 50 MCG/ACT nasal spray [id number] No Place 2 sprays into both nostrils daily as needed for allergies or rhinitis.  Patient not taking: Reported on 02/24/2022   Horald Pollen, MD Not Taking Active Self  Krill Oil 300 [place] CAPS [id number] Yes Take 300 mg by mouth daily. [provider] Taking Active Self  Multiple Vitamins-Minerals (CENTRUM SILVER 50+WOMEN PO) [id number] Yes Take 1 tablet by mouth daily. [provider] Taking Active Self  valsartan (DIOVAN) 320 MG tablet [id number] Yes Take 320 mg by mouth daily. [provider] Taking Active            Assessment/Plan:   Hypertension: - Currently controlled - Patient endorses elevated levels at provider's offices in comparison to home readings.  She seemed very concerned with last reading of 163/79 at recent visit, especially after increase in her valsartan dose.  She did also state that pressure was taken immediately after walking into the office. - Reviewed appropriate blood pressure monitoring technique and reviewed goal blood pressure. Recommended to check home blood pressure and heart rate daily - Recommend to continue to monitor; could consider increasing amlodipine to '10mg'$  daily if numbers >140/90 (don't want hypotensive symptoms based on age/potential fall risk).  Follow Up Plan: None necessary from pharmacy perspective  at this time.  Darlina Guys, PharmD, DPLA

## 2022-02-28 ENCOUNTER — Ambulatory Visit (INDEPENDENT_AMBULATORY_CARE_PROVIDER_SITE_OTHER): Payer: Medicare Other | Admitting: Emergency Medicine

## 2022-02-28 ENCOUNTER — Encounter: Payer: Self-pay | Admitting: Emergency Medicine

## 2022-02-28 VITALS — BP 132/70 | HR 105 | Temp 98.1°F | Ht 61.0 in | Wt 204.5 lb

## 2022-02-28 DIAGNOSIS — Z1231 Encounter for screening mammogram for malignant neoplasm of breast: Secondary | ICD-10-CM | POA: Diagnosis not present

## 2022-02-28 DIAGNOSIS — I10 Essential (primary) hypertension: Secondary | ICD-10-CM | POA: Diagnosis not present

## 2022-02-28 DIAGNOSIS — D472 Monoclonal gammopathy: Secondary | ICD-10-CM | POA: Diagnosis not present

## 2022-02-28 DIAGNOSIS — N1831 Chronic kidney disease, stage 3a: Secondary | ICD-10-CM

## 2022-02-28 DIAGNOSIS — Z23 Encounter for immunization: Secondary | ICD-10-CM | POA: Diagnosis not present

## 2022-02-28 DIAGNOSIS — Z8579 Personal history of other malignant neoplasms of lymphoid, hematopoietic and related tissues: Secondary | ICD-10-CM | POA: Diagnosis not present

## 2022-02-28 MED ORDER — AMLODIPINE BESYLATE 5 MG PO TABS: 5.0000 mg | ORAL_TABLET | Freq: Every day | ORAL | 3 refills | Status: DC

## 2022-02-28 NOTE — Assessment & Plan Note (Signed)
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

## 2022-02-28 NOTE — Progress Notes (Signed)
Diane Daugherty 82 y.o.   Chief Complaint  Patient presents with   Follow-up    77mth f/u appt, patient is having trouble with her back     HISTORY OF PRESENT ILLNESS: This is a 82y.o. female here for 644-monthollow-up of hypertension. Has history of multiple myeloma.  Follows up with hematologist oncologist on a regular basis. Recent metastatic bone survey done on 02/18/2022.  No definite lytic bone lesions identified. Lumbar spine MRI done in 2023 report reviewed with patient.  Shows significant spinal stenosis.  Patient has lumbar pain mostly in the morning, gets better during the day with ambulation.  Takes Tylenol for arthritis with good results. BP Readings from Last 3 Encounters:  02/28/22 (!) 150/82  02/10/22 (!) 163/79  10/11/21 (!) 139/52     HPI   Prior to Admission medications   Medication Sig Start Date End Date Taking? Authorizing Provider  acetaminophen (TYLENOL) 650 MG CR tablet Take 650-1,300 mg by mouth every 8 (eight) hours as needed for pain.   Yes [provider]  amLODipine (NORVASC) 5 MG tablet Take 1 tablet (5 mg total) by mouth daily. 05/27/21 05/22/22 Yes Lavante Toso, MiInes BloomerMD  baclofen (LIORESAL) 10 MG tablet Take 1 tablet (10 mg total) by mouth 3 (three) times daily as needed for muscle spasms. 10/11/21  Yes BlMcarthur RossettiMD  Calcium Citrate-Vitamin D (CALCIUM CITRATE + D PO) Take 1 tablet by mouth daily.   Yes [provider]  CHELATED IRON PO Take 27 mg by mouth daily.   Yes [provider]  KrJavier Dockeril 300 MG CAPS Take 300 mg by mouth daily.   Yes [provider]  Multiple Vitamins-Minerals (CENTRUM SILVER 50+WOMEN PO) Take 1 tablet by mouth daily.   Yes [provider]  valsartan (DIOVAN) 320 MG tablet Take 320 mg by mouth daily.   Yes [provider]  fluticasone (FLONASE) 50 MCG/ACT nasal spray Place 2 sprays into both nostrils daily as needed for allergies or rhinitis. 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   Stage 3a chronic kidney disease ([facility]05/04/2021   History of multiple myeloma 05/27/2021   Primary osteoarthritis of left knee 10/07/2014   History of left knee replacement 10/07/2014   Asthma, chronic 08/30/2012   Essential hypertension 08/30/2012   Osteoarthritis of right knee 08/30/2012   Sleep apnea 08/30/2012   Severe obesity (BMI >= 40) ([facility]08/07/2012    Past Medical History:  Diagnosis Date   Anemia 09/24/1968   "after childbirth"   Arthritis    "feels like all over"   Carpal tunnel syndrome    Carpal tunnel syndrome    left   Cataract    immature on right   Chronic bronchitis ([facility]   "get it most years" (10/08/2014)   Chronic kidney disease    stage III, per Pt   Family history of anesthesia complication    daughter gets sick   History of blood transfusion    "  w/one of my back OR's"   Hypertension    takes Hyzaar daily   Joint pain    "all over"   Joint swelling    Muscle spasms of lower extremity    takes Baclofen daily prn   Nocturia    OSA on CPAP    Peripheral vascular disease (HCC)    PFO (patent foramen ovale)    positive saline bubble study 05/12/09 ([facility])   Pneumonia 2010/ 2011   PONV (postoperative nausea and vomiting)    woke up during hysterectomy   Shortness of breath    with exertion;Albuterol prn   Urinary frequency     Past Surgical History:  Procedure Laterality Date   ABDOMINAL HYSTERECTOMY  1970's?   APPENDECTOMY     BACK  SURGERY     BREAST BIOPSY Right 36's   [place]; 1963; [place]  80's   d/t pneumothorax   COLONOSCOPY     FRACTURE SURGERY     HARDWARE REMOVAL Right 1990's   "took screw out of my lower leg ~ 1 yr after putting it in"   HEMILAMINOTOMY LUMBAR SPINE  02/2002   L5; decompression of the L5 and S1 nerve root; synovial cyst excision/notes 06/09/2010   KNEE ARTHROSCOPY Right ?2013   LUMBAR LAMINECTOMY/DECOMPRESSION MICRODISCECTOMY  02/2010   POSTERIOR LUMBAR FUSION  05/2003   L4-5 and S-1    TIBIA FRACTURE SURGERY Right 1990's   "put screw in"   TOTAL KNEE ARTHROPLASTY Right 08/28/2012   Procedure: TOTAL KNEE ARTHROPLASTY;  Surgeon: Garald Balding, MD;  Location: [facility];  Service: Orthopedics;  Laterality: Right;   TOTAL KNEE ARTHROPLASTY Left 10/07/2014   TOTAL KNEE ARTHROPLASTY Left 10/07/2014   Procedure: TOTAL KNEE ARTHROPLASTY;  Surgeon: Garald Balding, MD;  Location: [facility];  Service: Orthopedics;  Laterality: Left;   TOTAL KNEE REVISION Left 10/08/2021   Procedure: LEFT TOTAL KNEE REVISION;  Surgeon: Mcarthur Rossetti, MD;  Location: [facility];  Service: Orthopedics;  Laterality: Left;    Social History   Socioeconomic History   Marital status: Divorced    Spouse name: Not on file   Number of children: Not on file   Years of education: Not on file   Highest education level: Not on file  Occupational History   Not on file  Tobacco Use   Smoking status: Never   Smokeless tobacco: Never  Vaping Use   Vaping Use: Never used  Substance and Sexual Activity   Alcohol use: No   Drug use: No   Sexual activity: Not Currently  Other Topics Concern   Not on file  Social History Narrative   Not on file   Social Determinants of Health   Financial Resource Strain: Not on file  Food Insecurity: No Food Insecurity (11/12/2021)   Hunger Vital Sign    Worried About Running Out of Food in the Last Year: Never true    Ran Out of Food in the Last  Year: Never true  Transportation Needs: No Transportation Needs (11/12/2021)   PRAPARE - Hydrologist (Medical): No    Lack of Transportation (Non-Medical): No  Physical Activity: Not on file  Stress: Not on file  Social Connections: Not on file  Intimate Partner Violence: Not on file    No family history on file.   Review of Systems  Constitutional: Negative.  Negative for chills and fever.  HENT: Negative.  Negative for  congestion and sore throat.   Respiratory: Negative.  Negative for cough and shortness of breath.   Cardiovascular: Negative.  Negative for chest pain and palpitations.  Gastrointestinal:  Negative for abdominal pain, diarrhea, nausea and vomiting.  Musculoskeletal:  Positive for back pain.  Skin: Negative.  Negative for rash.  Neurological: Negative.  Negative for dizziness and headaches.  All other systems reviewed and are negative.  Today's Vitals   02/28/22 1101 02/28/22 1142 02/28/22 1217  BP: (!) 150/82 (!) 146/82 132/70  Pulse: (!) 105    Temp: 98.1 F (36.7 C)    TempSrc: Oral    SpO2: 90%    Weight: 204 lb 8 oz (92.8 kg)    Height: '5\' 1"'$  (1.549 m)     Body mass index is 38.64 kg/m.   Physical Exam Vitals reviewed.  Constitutional:      Appearance: Normal appearance.  HENT:     Head: Normocephalic.     Mouth/Throat:     Mouth: Mucous membranes are moist.     Pharynx: Oropharynx is clear.  Eyes:     Extraocular Movements: Extraocular movements intact.     Pupils: Pupils are equal, round, and reactive to light.  Cardiovascular:     Rate and Rhythm: Normal rate and regular rhythm.     Pulses: Normal pulses.     Heart sounds: Normal heart sounds.  Pulmonary:     Effort: Pulmonary effort is normal.     Breath sounds: Normal breath sounds.  Musculoskeletal:     Cervical back: No tenderness.     Right lower leg: No edema.     Left lower leg: No edema.  Lymphadenopathy:     Cervical: No cervical adenopathy.   Skin:    General: Skin is warm and dry.  Neurological:     General: No focal deficit present.     Mental Status: She is alert and oriented to person, place, and time.  Psychiatric:        Mood and Affect: Mood normal.        Behavior: Behavior normal.      ASSESSMENT & PLAN: A total of 46 minutes was spent with the patient and counseling/coordination of care regarding preparing for this visit, review of most recent office visit notes, review of most recent hematology oncologist office visit notes, review of chronic medical conditions under management, review of all medications, review of most recent lumbar spine MRI and metastatic bone survey reports, education on nutrition, cardiovascular risks associated with uncontrolled hypertension, prognosis, documentation, need for follow-up.  Problem List Items Addressed This Visit       Cardiovascular and Mediastinum   Essential hypertension - Primary    Well-controlled hypertension with normal blood pressure readings at home. Continue valsartan 320 mg daily and amlodipine 5 mg daily. Cardiovascular risks associated with hypertension discussed. Diet and nutrition discussed.      Relevant Medications   amLODipine (NORVASC) 5 MG tablet     Genitourinary   Stage 3a chronic kidney disease ([place])    Advised to stay well-hydrated and limit use of NSAIDs.        Other   History of multiple myeloma    Recent metastatic bone survey does not show lytic lesions.      Smoldering multiple myeloma    Stable.  Follows up with hematologist oncologist on a regular basis. Most recent office visit assessment and plan as follows:  The patient currently meets one of the three 20-2-20 criteria (bmbx  with 20% plasma cells). Two criteria are required for treatment.  --labs today show Hgb 6.8, hemoglobin 12.3, MCV 88.9, and platelets of 218. -- No clear indication for treatment as long as the patient does not meet this criteria or any of the CRAB  criteria --At each visit we will order SPEP, UPEP, serum free light chains, and LDH as well as CMP and CBC --Assure imaging of the bones at least once per year.  Scan is currently due.  --Recommend follow-up visit in 6 months time with interval 3 month labs.       Other Visit Diagnoses     Need for vaccination       Relevant Orders   Pneumococcal conjugate vaccine 20-valent (Prevnar 20) (Completed)   Encounter for screening mammogram for malignant neoplasm of breast       Relevant Orders   MM Digital Screening      Patient Instructions  Hypertension, Adult High blood pressure (hypertension) is when the force of blood pumping through the arteries is too strong. The arteries are the blood vessels that carry blood from the heart throughout the body. Hypertension forces the heart to work harder to pump blood and may cause arteries to become narrow or stiff. Untreated or uncontrolled hypertension can lead to a heart attack, heart failure, a stroke, kidney disease, and other problems. A blood pressure reading consists of a higher number over a lower number. Ideally, your blood pressure should be below 120/80. The first ("top") number is called the systolic pressure. It is a measure of the pressure in your arteries as your heart beats. The second ("bottom") number is called the diastolic pressure. It is a measure of the pressure in your arteries as the heart relaxes. What are the causes? The exact cause of this condition is not known. There are some conditions that result in high blood pressure. What increases the risk? Certain factors may make you more likely to develop high blood pressure. Some of these risk factors are under your control, including: Smoking. Not getting enough exercise or physical activity. Being overweight. Having too much fat, sugar, calories, or salt (sodium) in your diet. Drinking too much alcohol. Other risk factors include: Having a personal history of heart  disease, diabetes, high cholesterol, or kidney disease. Stress. Having a family history of high blood pressure and high cholesterol. Having obstructive sleep apnea. Age. The risk increases with age. What are the signs or symptoms? High blood pressure may not cause symptoms. Very high blood pressure (hypertensive crisis) may cause: Headache. Fast or irregular heartbeats (palpitations). Shortness of breath. Nosebleed. Nausea and vomiting. Vision changes. Severe chest pain, dizziness, and seizures. How is this diagnosed? This condition is diagnosed by measuring your blood pressure while you are seated, with your arm resting on a flat surface, your legs uncrossed, and your feet flat on the floor. The cuff of the blood pressure monitor will be placed directly against the skin of your upper arm at the level of your heart. Blood pressure should be measured at least twice using the same arm. Certain conditions can cause a difference in blood pressure between your right and left arms. If you have a high blood pressure reading during one visit or you have normal blood pressure with other risk factors, you may be asked to: Return on a different day to have your blood pressure checked again. Monitor your blood pressure at home for 1 week or longer. If you are diagnosed with hypertension, you may have  other blood or imaging tests to help your health care provider understand your overall risk for other conditions. How is this treated? This condition is treated by making healthy lifestyle changes, such as eating healthy foods, exercising more, and reducing your alcohol intake. You may be referred for counseling on a healthy diet and physical activity. Your health care provider may prescribe medicine if lifestyle changes are not enough to get your blood pressure under control and if: Your systolic blood pressure is above 130. Your diastolic blood pressure is above 80. Your personal target blood pressure may  vary depending on your medical conditions, your age, and other factors. Follow these instructions at home: Eating and drinking  Eat a diet that is high in fiber and potassium, and low in sodium, added sugar, and fat. An example of this eating plan is called the DASH diet. DASH stands for Dietary Approaches to Stop Hypertension. To eat this way: Eat plenty of fresh fruits and vegetables. Try to fill one half of your plate at each meal with fruits and vegetables. Eat whole grains, such as whole-wheat pasta, brown rice, or whole-grain bread. Fill about one fourth of your plate with whole grains. Eat or drink low-fat dairy products, such as skim milk or low-fat yogurt. Avoid fatty cuts of meat, processed or cured meats, and poultry with skin. Fill about one fourth of your plate with lean proteins, such as fish, chicken without skin, beans, eggs, or tofu. Avoid pre-made and processed foods. These tend to be higher in sodium, added sugar, and fat. Reduce your daily sodium intake. Many people with hypertension should eat less than 1,500 mg of sodium a day. Do not drink alcohol if: Your health care provider tells you not to drink. You are pregnant, may be pregnant, or are planning to become pregnant. If you drink alcohol: Limit how much you have to: 0-1 drink a day for women. 0-2 drinks a day for men. Know how much alcohol is in your drink. In the [place] one drink equals one 12 oz bottle of beer (355 mL), one 5 oz glass of wine (148 mL), or one 1 oz glass of hard liquor (44 mL). Lifestyle  Work with your health care provider to maintain a healthy body weight or to lose weight. Ask what an ideal weight is for you. Get at least 30 minutes of exercise that causes your heart to beat faster (aerobic exercise) most days of the week. Activities may include walking, swimming, or biking. Include exercise to strengthen your muscles (resistance exercise), such as Pilates or lifting weights, as part of your  weekly exercise routine. Try to do these types of exercises for 30 minutes at least 3 days a week. Do not use any products that contain nicotine or tobacco. These products include cigarettes, chewing tobacco, and vaping devices, such as e-cigarettes. If you need help quitting, ask your health care provider. Monitor your blood pressure at home as told by your health care provider. Keep all follow-up visits. This is important. Medicines Take over-the-counter and prescription medicines only as told by your health care provider. Follow directions carefully. Blood pressure medicines must be taken as prescribed. Do not skip doses of blood pressure medicine. Doing this puts you at risk for problems and can make the medicine less effective. Ask your health care provider about side effects or reactions to medicines that you should watch for. Contact a health care provider if you: Think you are having a reaction to a medicine you are  taking. Have headaches that keep coming back (recurring). Feel dizzy. Have swelling in your ankles. Have trouble with your vision. Get help right away if you: Develop a severe headache or confusion. Have unusual weakness or numbness. Feel faint. Have severe pain in your chest or abdomen. Vomit repeatedly. Have trouble breathing. These symptoms may be an emergency. Get help right away. Call 911. Do not wait to see if the symptoms will go away. Do not drive yourself to the hospital. Summary Hypertension is when the force of blood pumping through your arteries is too strong. If this condition is not controlled, it may put you at risk for serious complications. Your personal target blood pressure may vary depending on your medical conditions, your age, and other factors. For most people, a normal blood pressure is less than 120/80. Hypertension is treated with lifestyle changes, medicines, or a combination of both. Lifestyle changes include losing weight, eating a healthy,  low-sodium diet, exercising more, and limiting alcohol. This information is not intended to replace advice given to you by your health care provider. Make sure you discuss any questions you have with your health care provider. Document Revised: 11/17/2020 Document Reviewed: 11/17/2020 Elsevier Patient Education  [place], MD [facility]

## 2022-02-28 NOTE — Assessment & Plan Note (Signed)
Stable.  Follows up with hematologist oncologist on a regular basis. Most recent office visit assessment and plan as follows:  The patient currently meets one of the three 20-2-20 criteria (bmbx with 20% plasma cells). Two criteria are required for treatment.  --labs today show Hgb 6.8, hemoglobin 12.3, MCV 88.9, and platelets of 218. -- No clear indication for treatment as long as the patient does not meet this criteria or any of the CRAB criteria --At each visit we will order SPEP, UPEP, serum free light chains, and LDH as well as CMP and CBC --Assure imaging of the bones at least once per year.  Scan is currently due.  --Recommend follow-up visit in 6 months time with interval 3 month labs.

## 2022-02-28 NOTE — Assessment & Plan Note (Signed)
Well-controlled hypertension with normal blood pressure readings at home. Continue valsartan 320 mg daily and amlodipine 5 mg daily. Cardiovascular risks associated with hypertension discussed. Diet and nutrition discussed.

## 2022-02-28 NOTE — Assessment & Plan Note (Signed)
Advised to stay well-hydrated and limit use of NSAIDs.

## 2022-02-28 NOTE — Patient Instructions (Signed)
Hypertension, Adult High blood pressure (hypertension) is when the force of blood pumping through the arteries is too strong. The arteries are the blood vessels that carry blood from the heart throughout the body. Hypertension forces the heart to work harder to pump blood and may cause arteries to become narrow or stiff. Untreated or uncontrolled hypertension can lead to a heart attack, heart failure, a stroke, kidney disease, and other problems. A blood pressure reading consists of a higher number over a lower number. Ideally, your blood pressure should be below 120/80. The first ("top") number is called the systolic pressure. It is a measure of the pressure in your arteries as your heart beats. The second ("bottom") number is called the diastolic pressure. It is a measure of the pressure in your arteries as the heart relaxes. What are the causes? The exact cause of this condition is not known. There are some conditions that result in high blood pressure. What increases the risk? Certain factors may make you more likely to develop high blood pressure. Some of these risk factors are under your control, including: Smoking. Not getting enough exercise or physical activity. Being overweight. Having too much fat, sugar, calories, or salt (sodium) in your diet. Drinking too much alcohol. Other risk factors include: Having a personal history of heart disease, diabetes, high cholesterol, or kidney disease. Stress. Having a family history of high blood pressure and high cholesterol. Having obstructive sleep apnea. Age. The risk increases with age. What are the signs or symptoms? High blood pressure may not cause symptoms. Very high blood pressure (hypertensive crisis) may cause: Headache. Fast or irregular heartbeats (palpitations). Shortness of breath. Nosebleed. Nausea and vomiting. Vision changes. Severe chest pain, dizziness, and seizures. How is this diagnosed? This condition is diagnosed by  measuring your blood pressure while you are seated, with your arm resting on a flat surface, your legs uncrossed, and your feet flat on the floor. The cuff of the blood pressure monitor will be placed directly against the skin of your upper arm at the level of your heart. Blood pressure should be measured at least twice using the same arm. Certain conditions can cause a difference in blood pressure between your right and left arms. If you have a high blood pressure reading during one visit or you have normal blood pressure with other risk factors, you may be asked to: Return on a different day to have your blood pressure checked again. Monitor your blood pressure at home for 1 week or longer. If you are diagnosed with hypertension, you may have other blood or imaging tests to help your health care provider understand your overall risk for other conditions. How is this treated? This condition is treated by making healthy lifestyle changes, such as eating healthy foods, exercising more, and reducing your alcohol intake. You may be referred for counseling on a healthy diet and physical activity. Your health care provider may prescribe medicine if lifestyle changes are not enough to get your blood pressure under control and if: Your systolic blood pressure is above 130. Your diastolic blood pressure is above 80. Your personal target blood pressure may vary depending on your medical conditions, your age, and other factors. Follow these instructions at home: Eating and drinking  Eat a diet that is high in fiber and potassium, and low in sodium, added sugar, and fat. An example of this eating plan is called the DASH diet. DASH stands for Dietary Approaches to Stop Hypertension. To eat this way: Eat   plenty of fresh fruits and vegetables. Try to fill one half of your plate at each meal with fruits and vegetables. Eat whole grains, such as whole-wheat pasta, brown rice, or whole-grain bread. Fill about one  fourth of your plate with whole grains. Eat or drink low-fat dairy products, such as skim milk or low-fat yogurt. Avoid fatty cuts of meat, processed or cured meats, and poultry with skin. Fill about one fourth of your plate with lean proteins, such as fish, chicken without skin, beans, eggs, or tofu. Avoid pre-made and processed foods. These tend to be higher in sodium, added sugar, and fat. Reduce your daily sodium intake. Many people with hypertension should eat less than 1,500 mg of sodium a day. Do not drink alcohol if: Your health care provider tells you not to drink. You are pregnant, may be pregnant, or are planning to become pregnant. If you drink alcohol: Limit how much you have to: 0-1 drink a day for women. 0-2 drinks a day for men. Know how much alcohol is in your drink. In the [place] one drink equals one 12 oz bottle of beer (355 mL), one 5 oz glass of wine (148 mL), or one 1 oz glass of hard liquor (44 mL). Lifestyle  Work with your health care provider to maintain a healthy body weight or to lose weight. Ask what an ideal weight is for you. Get at least 30 minutes of exercise that causes your heart to beat faster (aerobic exercise) most days of the week. Activities may include walking, swimming, or biking. Include exercise to strengthen your muscles (resistance exercise), such as Pilates or lifting weights, as part of your weekly exercise routine. Try to do these types of exercises for 30 minutes at least 3 days a week. Do not use any products that contain nicotine or tobacco. These products include cigarettes, chewing tobacco, and vaping devices, such as e-cigarettes. If you need help quitting, ask your health care provider. Monitor your blood pressure at home as told by your health care provider. Keep all follow-up visits. This is important. Medicines Take over-the-counter and prescription medicines only as told by your health care provider. Follow directions carefully. Blood  pressure medicines must be taken as prescribed. Do not skip doses of blood pressure medicine. Doing this puts you at risk for problems and can make the medicine less effective. Ask your health care provider about side effects or reactions to medicines that you should watch for. Contact a health care provider if you: Think you are having a reaction to a medicine you are taking. Have headaches that keep coming back (recurring). Feel dizzy. Have swelling in your ankles. Have trouble with your vision. Get help right away if you: Develop a severe headache or confusion. Have unusual weakness or numbness. Feel faint. Have severe pain in your chest or abdomen. Vomit repeatedly. Have trouble breathing. These symptoms may be an emergency. Get help right away. Call 911. Do not wait to see if the symptoms will go away. Do not drive yourself to the hospital. Summary Hypertension is when the force of blood pumping through your arteries is too strong. If this condition is not controlled, it may put you at risk for serious complications. Your personal target blood pressure may vary depending on your medical conditions, your age, and other factors. For most people, a normal blood pressure is less than 120/80. Hypertension is treated with lifestyle changes, medicines, or a combination of both. Lifestyle changes include losing weight, eating a healthy,   low-sodium diet, exercising more, and limiting alcohol. This information is not intended to replace advice given to you by your health care provider. Make sure you discuss any questions you have with your health care provider. Document Revised: 11/17/2020 Document Reviewed: 11/17/2020 Elsevier Patient Education  2023 [employer]  

## 2022-03-28 ENCOUNTER — Ambulatory Visit (INDEPENDENT_AMBULATORY_CARE_PROVIDER_SITE_OTHER): Payer: Medicare Other | Admitting: Orthopaedic Surgery

## 2022-03-28 ENCOUNTER — Encounter: Payer: Self-pay | Admitting: Orthopaedic Surgery

## 2022-03-28 ENCOUNTER — Ambulatory Visit (INDEPENDENT_AMBULATORY_CARE_PROVIDER_SITE_OTHER): Payer: Medicare Other

## 2022-03-28 DIAGNOSIS — Z96652 Presence of left artificial knee joint: Secondary | ICD-10-CM

## 2022-03-28 NOTE — Progress Notes (Signed)
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  She is doing well with the knee replacement and said it feels just little bit tender with bending but she can bend it better than her right primary knee replacement that she had years ago.  On exam her left more recent operative knee has full extension and flexion past 90 degrees.  Feels stable on exam.  Her range of motion is significant limited on her right total knee arthroplasty.  Her extension is almost full but her flexion is 90 to 90 degrees.  A standing AP and lateral of the left revision knee also shows an AP of the right knee.  The left knee revision prosthesis appears to be well-seated and in good position.  The right knee does have cystic changes similar to the left knee in terms of the anterior lateral tibia.  This is not changed from films from a year ago.  I would like to see her back in 6 months.  At that visit we need a standing AP and lateral of both knees so we can see what is going on with her right knee in terms of whether or not it is loosening and whether or not we recommend a revision of that knee.

## 2022-03-31 ENCOUNTER — Encounter: Payer: Self-pay | Admitting: Radiology

## 2022-04-05 ENCOUNTER — Telehealth: Payer: Self-pay

## 2022-04-05 NOTE — Telephone Encounter (Signed)
Contacted Diane Daugherty to schedule their annual wellness visit. Appointment made for 04/12/22.  Norton Blizzard, [place] ([facility])  [facility] [phone]

## 2022-04-12 ENCOUNTER — Ambulatory Visit (INDEPENDENT_AMBULATORY_CARE_PROVIDER_SITE_OTHER): Payer: Medicare Other

## 2022-04-12 VITALS — Wt 204.0 lb

## 2022-04-12 DIAGNOSIS — Z0000 Encounter for general adult medical examination without abnormal findings: Secondary | ICD-10-CM | POA: Diagnosis not present

## 2022-04-12 NOTE — Progress Notes (Signed)
I connected with  Diane Daugherty on 04/12/22 by a audio enabled telemedicine application and verified that I am speaking with the correct person using two identifiers.  Patient Location: Home  Provider Location: Office/Clinic  I discussed the limitations of evaluation and management by telemedicine. The patient expressed understanding and agreed to proceed.   Subjective:   Diane Daugherty is a 82 y.o. female who presents for Medicare Annual (Subsequent) preventive examination.  Review of Systems     Cardiac Risk Factors include: advanced age (>42men, >22 women);obesity (BMI >30kg/m2);hypertension;sedentary lifestyle     Objective:    Today's Vitals   04/12/22 1414  Weight: 204 lb (92.5 kg)   Body mass index is 38.55 kg/m.     04/12/2022    2:19 PM 02/10/2022   11:20 AM 12/01/2021   11:11 AM 09/29/2021   11:23 AM 10/01/2020    9:30 AM 10/08/2014    2:25 PM 09/25/2014   10:03 AM  Advanced Directives  Does Patient Have a Medical Advance Directive? Yes No No No No No   Type of Paramedic of [place];Living will        Copy of [place] in Chart? No - copy requested        Would patient like information on creating a medical advance directive?  No - Patient declined No - Patient declined  Yes (MAU/Ambulatory/Procedural Areas - Information given) No - patient declined information No - patient declined information    Current Medications (verified) Outpatient Encounter Medications as of 04/12/2022  Medication Sig   acetaminophen (TYLENOL) 650 MG CR tablet Take 650-1,300 mg by mouth every 8 (eight) hours as needed for pain.   amLODipine (NORVASC) 5 MG tablet Take 1 tablet (5 mg total) by mouth daily.   baclofen (LIORESAL) 10 MG tablet Take 1 tablet (10 mg total) by mouth 3 (three) times daily as needed for muscle spasms.   Calcium Citrate-Vitamin D (CALCIUM CITRATE + D PO) Take 1 tablet by mouth daily.   CHELATED IRON PO Take 27 mg by mouth daily.    Krill Oil 300 MG CAPS Take 300 mg by mouth daily.   Multiple Vitamins-Minerals (CENTRUM SILVER 50+WOMEN PO) Take 1 tablet by mouth daily.   valsartan (DIOVAN) 320 MG tablet Take 320 mg by mouth daily.   fluticasone (FLONASE) 50 MCG/ACT nasal spray Place 2 sprays into both nostrils daily as needed for allergies or rhinitis. (Patient not taking: Reported on 04/12/2022)   No facility-administered encounter medications on file as of 04/12/2022.    Allergies (verified) Levaquin [levofloxacin], Morphine and related, Other, Oxycodone, and Naprosyn [naproxen]   History: Past Medical History:  Diagnosis Date   Anemia 09/24/1968   "after childbirth"   Arthritis    "feels like all over"   Carpal tunnel syndrome    Carpal tunnel syndrome    left   Cataract    immature on right   Chronic bronchitis ([place])    "get it most years" (10/08/2014)   Chronic kidney disease    stage III, per Pt   Family history of anesthesia complication    daughter gets sick   History of blood transfusion    "w/one of my back OR's"   Hypertension    takes Hyzaar daily   Joint pain    "all over"   Joint swelling    Muscle spasms of lower extremity    takes Baclofen daily prn   Nocturia    OSA  on CPAP    Peripheral vascular disease (HCC)    PFO (patent foramen ovale)    positive saline bubble study 05/12/09 ([facility])   Pneumonia 2010/ 2011   PONV (postoperative nausea and vomiting)    woke up during hysterectomy   Shortness of breath    with exertion;Albuterol prn   Urinary frequency    Past Surgical History:  Procedure Laterality Date   ABDOMINAL HYSTERECTOMY  1970's?   APPENDECTOMY     BACK SURGERY     BREAST BIOPSY Right 65's   [place]; 1963; [place]  80's   d/t pneumothorax   COLONOSCOPY     FRACTURE SURGERY     HARDWARE REMOVAL Right 1990's   "took screw out of my lower leg ~ 1 yr after putting it in"   HEMILAMINOTOMY LUMBAR SPINE  02/2002   L5;  decompression of the L5 and S1 nerve root; synovial cyst excision/notes 06/09/2010   KNEE ARTHROSCOPY Right ?2013   LUMBAR LAMINECTOMY/DECOMPRESSION MICRODISCECTOMY  02/2010   POSTERIOR LUMBAR FUSION  05/2003   L4-5 and S-1    TIBIA FRACTURE SURGERY Right 1990's   "put screw in"   TOTAL KNEE ARTHROPLASTY Right 08/28/2012   Procedure: TOTAL KNEE ARTHROPLASTY;  Surgeon: Garald Balding, MD;  Location: [facility];  Service: Orthopedics;  Laterality: Right;   TOTAL KNEE ARTHROPLASTY Left 10/07/2014   TOTAL KNEE ARTHROPLASTY Left 10/07/2014   Procedure: TOTAL KNEE ARTHROPLASTY;  Surgeon: Garald Balding, MD;  Location: [facility];  Service: Orthopedics;  Laterality: Left;   TOTAL KNEE REVISION Left 10/08/2021   Procedure: LEFT TOTAL KNEE REVISION;  Surgeon: Mcarthur Rossetti, MD;  Location: [facility];  Service: Orthopedics;  Laterality: Left;   History reviewed. No pertinent family history. Social History   Socioeconomic History   Marital status: Divorced    Spouse name: Not on file   Number of children: Not on file   Years of education: Not on file   Highest education level: Not on file  Occupational History   Not on file  Tobacco Use   Smoking status: Never   Smokeless tobacco: Never  Vaping Use   Vaping Use: Never used  Substance and Sexual Activity   Alcohol use: No   Drug use: No   Sexual activity: Not Currently  Other Topics Concern   Not on file  Social History Narrative   Not on file   Social Determinants of Health   Financial Resource Strain: Low Risk  (04/11/2022)   Overall Financial Resource Strain (CARDIA)    Difficulty of Paying Living Expenses: Not hard at all  Food Insecurity: No Food Insecurity (04/11/2022)   Hunger Vital Sign    Worried About Running Out of Food in the Last Year: Never true    Ran Out of Food in the Last Year: Never true  Transportation Needs: No Transportation Needs (04/11/2022)   PRAPARE - Hydrologist (Medical): No     Lack of Transportation (Non-Medical): No  Physical Activity: Insufficiently Active (04/11/2022)   Exercise Vital Sign    Days of Exercise per Week: 3 days    Minutes of Exercise per Session: 10 min  Stress: No Stress Concern Present (04/11/2022)   [place]    Feeling of Stress : Only a little  Social Connections: Socially Isolated (04/11/2022)   Social Connection and Isolation Panel [NHANES]    Frequency  of Communication with Friends and Family: Once a week    Frequency of Social Gatherings with Friends and Family: Once a week    Attends Religious Services: 1 to 4 times per year    Active Member of [employer] or Organizations: No    Attends Music therapist: Never    Marital Status: Divorced    Tobacco Counseling Counseling given: Not Answered   Clinical Intake:  Pre-visit preparation completed: Yes  Pain : No/denies pain     BMI - recorded: 38.55 Nutritional Status: BMI > 30  Obese Nutritional Risks: None Diabetes: No  How often do you need to have someone help you when you read instructions, pamphlets, or other written materials from your doctor or pharmacy?: 3 - Sometimes  Diabetic?no  Interpreter Needed?: No  Information entered by :: Charlott Rakes, LPN   Activities of Daily Living    04/11/2022    2:48 PM 09/29/2021   11:24 AM  In your present state of health, do you have any difficulty performing the following activities:  Hearing? 0   Vision? 0   Difficulty concentrating or making decisions? 0   Walking or climbing stairs? 1   Comment at times because of knees   Dressing or bathing? 0   Doing errands, shopping? 0 0  Preparing Food and eating ? N   Using the Toilet? N   In the past six months, have you accidently leaked urine? N   Do you have problems with loss of bowel control? N   Managing your Medications? N   Managing your Finances? N   Housekeeping or managing your  Housekeeping? N     Patient Care Team: Horald Pollen, MD as PCP - General (Internal Medicine)  Indicate any recent Medical Services you may have received from other than [facility] providers in the past year (date may be approximate).     Assessment:   This is a routine wellness examination for Diane Daugherty.  Hearing/Vision screen Hearing Screening - Comments:: Pt denies any hearing issues  Vision Screening - Comments:: Pt will follow up with new provider   Dietary issues and exercise activities discussed: Current Exercise Habits: Home exercise routine, Type of exercise: walking, Time (Minutes): 10, Frequency (Times/Week): 3, Weekly Exercise (Minutes/Week): 30   Goals Addressed             This Visit's Progress    Patient Stated       Walk and not use a cane        Depression Screen    04/12/2022    2:17 PM 02/28/2022   11:02 AM 08/26/2021   10:41 AM 05/27/2021    9:53 AM  PHQ 2/9 Scores  PHQ - 2 Score 0 0 2 0  PHQ- 9 Score   2     Fall Risk    04/12/2022    2:20 PM 04/11/2022    2:48 PM 02/28/2022   11:02 AM 08/26/2021   10:41 AM 05/27/2021    9:53 AM  [place] in the past year? 0 0 0 0 0  Number falls in past yr: 0 0 0    Injury with Fall? 0 0 0    Risk for fall due to : Impaired vision;Impaired balance/gait  No Fall Risks    Follow up Falls prevention discussed  Falls evaluation completed      FALL RISK PREVENTION PERTAINING TO THE HOME:  Any stairs in or around the home? Yes  If so, are there any without handrails? No  Home free of loose throw rugs in walkways, pet beds, electrical cords, etc? Yes  Adequate lighting in your home to reduce risk of falls? Yes   ASSISTIVE DEVICES UTILIZED TO PREVENT FALLS:  Life alert? No  Use of a cane, walker or w/c? Yes  Grab bars in the bathroom? Yes  Shower chair or bench in shower? No  Elevated toilet seat or a handicapped toilet? No   TIMED UP AND GO:  Was the test performed? No .  Cognitive Function:         04/12/2022    2:22 PM  6CIT Screen  What Year? 0 points  What month? 0 points  What time? 0 points  Count back from 20 0 points  Months in reverse 0 points  Repeat phrase 0 points  Total Score 0 points    Immunizations Immunization History  Administered Date(s) Administered   Fluad Quad(high Dose 65+) 11/07/2021   Influenza,inj,Quad PF,6+ Mos 11/22/2016   Moderna Sars-Covid-2 Vaccination 02/11/2019, 03/11/2019   PNEUMOCOCCAL CONJUGATE-20 02/28/2022   Pneumococcal Polysaccharide-23 01/14/2003, 10/09/2014, 11/22/2016   Tdap 08/24/2021    TDAP status: Up to date  Flu Vaccine status: Up to date  Pneumococcal vaccine status: Up to date  Covid-19 vaccine status: Completed vaccines  Qualifies for Shingles Vaccine? Yes   Zostavax completed No   Shingrix Completed?: No.    Education has been provided regarding the importance of this vaccine. Patient has been advised to call insurance company to determine out of pocket expense if they have not yet received this vaccine. Advised may also receive vaccine at local pharmacy or Health Dept. Verbalized acceptance and understanding.  Screening Tests Health Maintenance  Topic Date Due   Hepatitis C Screening  Never done   DEXA SCAN  Never done   COVID-19 Vaccine (3 - Moderna risk series) 04/08/2019   Zoster Vaccines- Shingrix (1 of 2) 05/29/2022 (Originally 09/10/1959)   Medicare Annual Wellness (AWV)  04/12/2023   DTaP/Tdap/Td (2 - Td or Tdap) 08/25/2031   Pneumonia Vaccine 65+ Years old  Completed   INFLUENZA VACCINE  Completed   HPV VACCINES  Aged Out    Health Maintenance  Health Maintenance Due  Topic Date Due   Hepatitis C Screening  Never done   DEXA SCAN  Never done   COVID-19 Vaccine (3 - Moderna risk series) 04/08/2019     Colon cancer screening about 4 years ago per pt   Mammogram status: Completed pt is scheduled for 04/29/22. Repeat every year     Hepatitis C Screening: does qualify  Vision Screening:  Recommended annual ophthalmology exams for early detection of glaucoma and other disorders of the eye. Is the patient up to date with their annual eye exam?  No  Who is the provider or what is the name of the office in which the patient attends annual eye exams? Will find a new provider  If pt is not established with a provider, would they like to be referred to a provider to establish care? No .   Dental Screening: Recommended annual dental exams for proper oral hygiene  Community Resource Referral / Chronic Care Management: CRR required this visit?  No   CCM required this visit?  No      Plan:     I have personally reviewed and noted the following in the patient's chart:   Medical and social history Use of alcohol, tobacco or illicit drugs  Current  medications and supplements including opioid prescriptions. Patient is not currently taking opioid prescriptions. Functional ability and status Nutritional status Physical activity Advanced directives List of other physicians Hospitalizations, surgeries, and ER visits in previous 12 months Vitals Screenings to include cognitive, depression, and falls Referrals and appointments  In addition, I have reviewed and discussed with patient certain preventive protocols, quality metrics, and best practice recommendations. A written personalized care plan for preventive services as well as general preventive health recommendations were provided to patient.     Willette Brace, LPN   [id number]   Nurse Notes: none

## 2022-04-12 NOTE — Patient Instructions (Signed)
Diane Daugherty , Thank you for taking time to come for your Medicare Wellness Visit. I appreciate your ongoing commitment to your health goals. Please review the following plan we discussed and let me know if I can assist you in the future.   These are the goals we discussed:  Goals      Patient Stated     Walk and not use a cane         This is a list of the screening recommended for you and due dates:  Health Maintenance  Topic Date Due   Hepatitis C Screening: USPSTF Recommendation to screen - Ages 22-79 yo.  Never done   DEXA scan (bone density measurement)  Never done   COVID-19 Vaccine (3 - Moderna risk series) 04/08/2019   Zoster (Shingles) Vaccine (1 of 2) 05/29/2022*   Medicare Annual Wellness Visit  04/12/2023   DTaP/Tdap/Td vaccine (2 - Td or Tdap) 08/25/2031   Pneumonia Vaccine  Completed   Flu Shot  Completed   HPV Vaccine  Aged Out  *Topic was postponed. The date shown is not the original due date.    Advanced directives: Please bring a copy of your health care power of attorney and living will to the office at your convenience.  Conditions/risks identified: get to walking better without the cane   Next appointment: Follow up in one year for your annual wellness visit    Preventive Care 65 Years and Older, Female Preventive care refers to lifestyle choices and visits with your health care provider that can promote health and wellness. What does preventive care include? A yearly physical exam. This is also called an annual well check. Dental exams once or twice a year. Routine eye exams. Ask your health care provider how often you should have your eyes checked. Personal lifestyle choices, including: Daily care of your teeth and gums. Regular physical activity. Eating a healthy diet. Avoiding tobacco and drug use. Limiting alcohol use. Practicing safe sex. Taking low-dose aspirin every day. Taking vitamin and mineral supplements as recommended by your health  care provider. What happens during an annual well check? The services and screenings done by your health care provider during your annual well check will depend on your age, overall health, lifestyle risk factors, and family history of disease. Counseling  Your health care provider may ask you questions about your: Alcohol use. Tobacco use. Drug use. Emotional well-being. Home and relationship well-being. Sexual activity. Eating habits. History of falls. Memory and ability to understand (cognition). Work and work Statistician. Reproductive health. Screening  You may have the following tests or measurements: Height, weight, and BMI. Blood pressure. Lipid and cholesterol levels. These may be checked every 5 years, or more frequently if you are over 48 years old. Skin check. Lung cancer screening. You may have this screening every year starting at age 73 if you have a 30-pack-year history of smoking and currently smoke or have quit within the past 15 years. Fecal occult blood test (FOBT) of the stool. You may have this test every year starting at age 52. Flexible sigmoidoscopy or colonoscopy. You may have a sigmoidoscopy every 5 years or a colonoscopy every 10 years starting at age 95. Hepatitis C blood test. Hepatitis B blood test. Sexually transmitted disease (STD) testing. Diabetes screening. This is done by checking your blood sugar (glucose) after you have not eaten for a while (fasting). You may have this done every 1-3 years. Bone density scan. This is done to screen  for osteoporosis. You may have this done starting at age 49. Mammogram. This may be done every 1-2 years. Talk to your health care provider about how often you should have regular mammograms. Talk with your health care provider about your test results, treatment options, and if necessary, the need for more tests. Vaccines  Your health care provider may recommend certain vaccines, such as: Influenza vaccine. This is  recommended every year. Tetanus, diphtheria, and acellular pertussis (Tdap, Td) vaccine. You may need a Td booster every 10 years. Zoster vaccine. You may need this after age 33. Pneumococcal 13-valent conjugate (PCV13) vaccine. One dose is recommended after age 46. Pneumococcal polysaccharide (PPSV23) vaccine. One dose is recommended after age 75. Talk to your health care provider about which screenings and vaccines you need and how often you need them. This information is not intended to replace advice given to you by your health care provider. Make sure you discuss any questions you have with your health care provider. Document Released: 02/06/2015 Document Revised: 09/30/2015 Document Reviewed: 11/11/2014 Elsevier Interactive Patient Education  2017 [place] Prevention in the Home Falls can cause injuries. They can happen to people of all ages. There are many things you can do to make your home safe and to help prevent falls. What can I do on the outside of my home? Regularly fix the edges of walkways and driveways and fix any cracks. Remove anything that might make you trip as you walk through a door, such as a raised step or threshold. Trim any bushes or trees on the path to your home. Use bright outdoor lighting. Clear any walking paths of anything that might make someone trip, such as rocks or tools. Regularly check to see if handrails are loose or broken. Make sure that both sides of any steps have handrails. Any raised decks and porches should have guardrails on the edges. Have any leaves, snow, or ice cleared regularly. Use sand or salt on walking paths during winter. Clean up any spills in your garage right away. This includes oil or grease spills. What can I do in the bathroom? Use night lights. Install grab bars by the toilet and in the tub and shower. Do not use towel bars as grab bars. Use non-skid mats or decals in the tub or shower. If you need to sit down in  the shower, use a plastic, non-slip stool. Keep the floor dry. Clean up any water that spills on the floor as soon as it happens. Remove soap buildup in the tub or shower regularly. Attach bath mats securely with double-sided non-slip rug tape. Do not have throw rugs and other things on the floor that can make you trip. What can I do in the bedroom? Use night lights. Make sure that you have a light by your bed that is easy to reach. Do not use any sheets or blankets that are too big for your bed. They should not hang down onto the floor. Have a firm chair that has side arms. You can use this for support while you get dressed. Do not have throw rugs and other things on the floor that can make you trip. What can I do in the kitchen? Clean up any spills right away. Avoid walking on wet floors. Keep items that you use a lot in easy-to-reach places. If you need to reach something above you, use a strong step stool that has a grab bar. Keep electrical cords out of the way. Do  not use floor polish or wax that makes floors slippery. If you must use wax, use non-skid floor wax. Do not have throw rugs and other things on the floor that can make you trip. What can I do with my stairs? Do not leave any items on the stairs. Make sure that there are handrails on both sides of the stairs and use them. Fix handrails that are broken or loose. Make sure that handrails are as long as the stairways. Check any carpeting to make sure that it is firmly attached to the stairs. Fix any carpet that is loose or worn. Avoid having throw rugs at the top or bottom of the stairs. If you do have throw rugs, attach them to the floor with carpet tape. Make sure that you have a light switch at the top of the stairs and the bottom of the stairs. If you do not have them, ask someone to add them for you. What else can I do to help prevent falls? Wear shoes that: Do not have high heels. Have rubber bottoms. Are comfortable  and fit you well. Are closed at the toe. Do not wear sandals. If you use a stepladder: Make sure that it is fully opened. Do not climb a closed stepladder. Make sure that both sides of the stepladder are locked into place. Ask someone to hold it for you, if possible. Clearly mark and make sure that you can see: Any grab bars or handrails. First and last steps. Where the edge of each step is. Use tools that help you move around (mobility aids) if they are needed. These include: Canes. Walkers. Scooters. Crutches. Turn on the lights when you go into a dark area. Replace any light bulbs as soon as they burn out. Set up your furniture so you have a clear path. Avoid moving your furniture around. If any of your floors are uneven, fix them. If there are any pets around you, be aware of where they are. Review your medicines with your doctor. Some medicines can make you feel dizzy. This can increase your chance of falling. Ask your doctor what other things that you can do to help prevent falls. This information is not intended to replace advice given to you by your health care provider. Make sure you discuss any questions you have with your health care provider. Document Released: 11/06/2008 Document Revised: 06/18/2015 Document Reviewed: 02/14/2014 Elsevier Interactive Patient Education  2017 [employer].

## 2022-04-29 ENCOUNTER — Ambulatory Visit
Admission: RE | Admit: 2022-04-29 | Discharge: 2022-04-29 | Disposition: A | Discharge status: Home / Self Care | Payer: PRIVATE HEALTH INSURANCE | Source: Ambulatory Visit | Attending: Emergency Medicine | Admitting: Emergency Medicine

## 2022-04-29 DIAGNOSIS — Z1231 Encounter for screening mammogram for malignant neoplasm of breast: Secondary | ICD-10-CM

## 2022-05-04 DIAGNOSIS — G4733 Obstructive sleep apnea (adult) (pediatric): Secondary | ICD-10-CM | POA: Diagnosis not present

## 2022-05-04 DIAGNOSIS — I10 Essential (primary) hypertension: Secondary | ICD-10-CM | POA: Diagnosis not present

## 2022-05-12 ENCOUNTER — Inpatient Hospital Stay: Payer: HMO | Attending: Hematology and Oncology

## 2022-05-12 DIAGNOSIS — D472 Monoclonal gammopathy: Secondary | ICD-10-CM | POA: Insufficient documentation

## 2022-05-12 LAB — CBC WITH DIFFERENTIAL (CANCER CENTER ONLY)
Abs Immature Granulocytes: 0.01 10*3/uL (ref 0.00–0.07)
Basophils Absolute: 0 10*3/uL (ref 0.0–0.1)
Basophils Relative: 1 %
Eosinophils Absolute: 0.2 10*3/uL (ref 0.0–0.5)
Eosinophils Relative: 4 %
HCT: 33.7 % — ABNORMAL LOW (ref 36.0–46.0)
Hemoglobin: 11.8 g/dL — ABNORMAL LOW (ref 12.0–15.0)
Immature Granulocytes: 0 %
Lymphocytes Relative: 39 %
Lymphs Abs: 2.4 10*3/uL (ref 0.7–4.0)
MCH: 32.5 pg (ref 26.0–34.0)
MCHC: 35 g/dL (ref 30.0–36.0)
MCV: 92.8 fL (ref 80.0–100.0)
Monocytes Absolute: 0.3 10*3/uL (ref 0.1–1.0)
Monocytes Relative: 5 %
Neutro Abs: 3.1 10*3/uL (ref 1.7–7.7)
Neutrophils Relative %: 51 %
Platelet Count: 196 10*3/uL (ref 150–400)
RBC: 3.63 MIL/uL — ABNORMAL LOW (ref 3.87–5.11)
RDW: 13.8 % (ref 11.5–15.5)
WBC Count: 6 10*3/uL (ref 4.0–10.5)
nRBC: 0 % (ref 0.0–0.2)

## 2022-05-12 LAB — CMP (CANCER CENTER ONLY)
ALT: 9 U/L (ref 0–44)
AST: 15 U/L (ref 15–41)
Albumin: 4.1 g/dL (ref 3.5–5.0)
Alkaline Phosphatase: 61 U/L (ref 38–126)
Anion gap: 7 (ref 5–15)
BUN: 19 mg/dL (ref 8–23)
CO2: 27 mmol/L (ref 22–32)
Calcium: 10 mg/dL (ref 8.9–10.3)
Chloride: 103 mmol/L (ref 98–111)
Creatinine: 1.2 mg/dL — ABNORMAL HIGH (ref 0.44–1.00)
GFR, Estimated: 46 mL/min — ABNORMAL LOW (ref >60)
Glucose, Bld: 98 mg/dL (ref 70–99)
Potassium: 3.9 mmol/L (ref 3.5–5.1)
Sodium: 137 mmol/L (ref 135–145)
Total Bilirubin: 0.5 mg/dL (ref 0.3–1.2)
Total Protein: 8.2 g/dL — ABNORMAL HIGH (ref 6.5–8.1)

## 2022-05-12 LAB — LACTATE DEHYDROGENASE: LDH: 141 U/L (ref 98–192)

## 2022-05-13 LAB — KAPPA/LAMBDA LIGHT CHAINS
Kappa free light chain: 76.7 mg/L — ABNORMAL HIGH (ref 3.3–19.4)
Kappa, lambda light chain ratio: 6.56 — ABNORMAL HIGH (ref 0.26–1.65)
Lambda free light chains: 11.7 mg/L (ref 5.7–26.3)

## 2022-05-13 LAB — BETA 2 MICROGLOBULIN, SERUM: Beta-2 Microglobulin: 3.3 mg/L — ABNORMAL HIGH (ref 0.6–2.4)

## 2022-05-16 LAB — MULTIPLE MYELOMA PANEL, SERUM
Albumin SerPl Elph-Mcnc: 3.7 g/dL (ref 2.9–4.4)
Albumin/Glob SerPl: 1 (ref 0.7–1.7)
Alpha 1: 0.2 g/dL (ref 0.0–0.4)
Alpha2 Glob SerPl Elph-Mcnc: 0.7 g/dL (ref 0.4–1.0)
B-Globulin SerPl Elph-Mcnc: 0.9 g/dL (ref 0.7–1.3)
Gamma Glob SerPl Elph-Mcnc: 2 g/dL — ABNORMAL HIGH (ref 0.4–1.8)
Globulin, Total: 3.8 g/dL (ref 2.2–3.9)
IgA: 44 mg/dL — ABNORMAL LOW (ref 64–422)
IgG (Immunoglobin G), Serum: 2356 mg/dL — ABNORMAL HIGH (ref 586–1602)
IgM (Immunoglobulin M), Srm: 19 mg/dL — ABNORMAL LOW (ref 26–217)
M Protein SerPl Elph-Mcnc: 1.5 g/dL — ABNORMAL HIGH
Total Protein ELP: 7.5 g/dL (ref 6.0–8.5)

## 2022-05-18 ENCOUNTER — Telehealth: Payer: Self-pay | Admitting: *Deleted

## 2022-05-18 NOTE — Telephone Encounter (Signed)
Notified of message below

## 2022-05-18 NOTE — Telephone Encounter (Signed)
-----   Message from Kyra Searles, RN sent at 05/18/2022 10:05 AM EDT -----  ----- Message ----- From: Jaci Standard, MD Sent: 05/16/2022   9:14 PM EDT To: Kyra Searles, RN  Please let [name] know that the [facility] labs were stable. We will plan to see her back in July 2024 as scheduled.   ----- Message ----- From: Leory Plowman, Lab In [place] Sent: 05/12/2022  10:06 AM EDT To: Jaci Standard, MD

## 2022-05-30 ENCOUNTER — Ambulatory Visit: Payer: PRIVATE HEALTH INSURANCE | Admitting: Physician Assistant

## 2022-06-06 ENCOUNTER — Ambulatory Visit: Payer: PRIVATE HEALTH INSURANCE | Admitting: Orthopaedic Surgery

## 2022-06-29 DIAGNOSIS — C9000 Multiple myeloma not having achieved remission: Secondary | ICD-10-CM | POA: Diagnosis not present

## 2022-06-29 DIAGNOSIS — E669 Obesity, unspecified: Secondary | ICD-10-CM | POA: Diagnosis not present

## 2022-06-29 DIAGNOSIS — G4733 Obstructive sleep apnea (adult) (pediatric): Secondary | ICD-10-CM | POA: Diagnosis not present

## 2022-06-29 DIAGNOSIS — I10 Essential (primary) hypertension: Secondary | ICD-10-CM | POA: Diagnosis not present

## 2022-08-02 DIAGNOSIS — G4733 Obstructive sleep apnea (adult) (pediatric): Secondary | ICD-10-CM | POA: Diagnosis not present

## 2022-08-02 DIAGNOSIS — I10 Essential (primary) hypertension: Secondary | ICD-10-CM | POA: Diagnosis not present

## 2022-08-11 ENCOUNTER — Other Ambulatory Visit: Payer: Self-pay

## 2022-08-11 ENCOUNTER — Inpatient Hospital Stay: Payer: HMO | Attending: Hematology and Oncology

## 2022-08-11 DIAGNOSIS — D472 Monoclonal gammopathy: Secondary | ICD-10-CM

## 2022-08-11 DIAGNOSIS — I129 Hypertensive chronic kidney disease with stage 1 through stage 4 chronic kidney disease, or unspecified chronic kidney disease: Secondary | ICD-10-CM | POA: Diagnosis not present

## 2022-08-11 DIAGNOSIS — N1831 Chronic kidney disease, stage 3a: Secondary | ICD-10-CM | POA: Diagnosis not present

## 2022-08-11 DIAGNOSIS — C9000 Multiple myeloma not having achieved remission: Secondary | ICD-10-CM | POA: Diagnosis not present

## 2022-08-11 LAB — CBC WITH DIFFERENTIAL (CANCER CENTER ONLY)
Abs Immature Granulocytes: 0.01 10*3/uL (ref 0.00–0.07)
Basophils Absolute: 0 10*3/uL (ref 0.0–0.1)
Basophils Relative: 1 %
Eosinophils Absolute: 0.2 10*3/uL (ref 0.0–0.5)
Eosinophils Relative: 3 %
HCT: 32.5 % — ABNORMAL LOW (ref 36.0–46.0)
Hemoglobin: 11.2 g/dL — ABNORMAL LOW (ref 12.0–15.0)
Immature Granulocytes: 0 %
Lymphocytes Relative: 37 %
Lymphs Abs: 2.5 10*3/uL (ref 0.7–4.0)
MCH: 32.1 pg (ref 26.0–34.0)
MCHC: 34.5 g/dL (ref 30.0–36.0)
MCV: 93.1 fL (ref 80.0–100.0)
Monocytes Absolute: 0.4 10*3/uL (ref 0.1–1.0)
Monocytes Relative: 6 %
Neutro Abs: 3.7 10*3/uL (ref 1.7–7.7)
Neutrophils Relative %: 53 %
Platelet Count: 179 10*3/uL (ref 150–400)
RBC: 3.49 MIL/uL — ABNORMAL LOW (ref 3.87–5.11)
RDW: 14.2 % (ref 11.5–15.5)
WBC Count: 6.9 10*3/uL (ref 4.0–10.5)
nRBC: 0 % (ref 0.0–0.2)

## 2022-08-11 LAB — LACTATE DEHYDROGENASE: LDH: 131 U/L (ref 98–192)

## 2022-08-11 LAB — CMP (CANCER CENTER ONLY)
ALT: 11 U/L (ref 0–44)
AST: 14 U/L — ABNORMAL LOW (ref 15–41)
Albumin: 3.9 g/dL (ref 3.5–5.0)
Alkaline Phosphatase: 63 U/L (ref 38–126)
Anion gap: 6 (ref 5–15)
BUN: 24 mg/dL — ABNORMAL HIGH (ref 8–23)
CO2: 28 mmol/L (ref 22–32)
Calcium: 9.6 mg/dL (ref 8.9–10.3)
Chloride: 104 mmol/L (ref 98–111)
Creatinine: 1.26 mg/dL — ABNORMAL HIGH (ref 0.44–1.00)
GFR, Estimated: 43 mL/min — ABNORMAL LOW (ref >60)
Glucose, Bld: 86 mg/dL (ref 70–99)
Potassium: 4.3 mmol/L (ref 3.5–5.1)
Sodium: 138 mmol/L (ref 135–145)
Total Bilirubin: 0.4 mg/dL (ref 0.3–1.2)
Total Protein: 7.9 g/dL (ref 6.5–8.1)

## 2022-08-12 LAB — KAPPA/LAMBDA LIGHT CHAINS
Kappa free light chain: 86.5 mg/L — ABNORMAL HIGH (ref 3.3–19.4)
Kappa, lambda light chain ratio: 9.61 — ABNORMAL HIGH (ref 0.26–1.65)
Lambda free light chains: 9 mg/L (ref 5.7–26.3)

## 2022-08-13 LAB — BETA 2 MICROGLOBULIN, SERUM: Beta-2 Microglobulin: 3.1 mg/L — ABNORMAL HIGH (ref 0.6–2.4)

## 2022-08-15 LAB — MULTIPLE MYELOMA PANEL, SERUM
Albumin SerPl Elph-Mcnc: 3.8 g/dL (ref 2.9–4.4)
Albumin/Glob SerPl: 1.1 (ref 0.7–1.7)
Alpha 1: 0.2 g/dL (ref 0.0–0.4)
Alpha2 Glob SerPl Elph-Mcnc: 0.7 g/dL (ref 0.4–1.0)
B-Globulin SerPl Elph-Mcnc: 0.8 g/dL (ref 0.7–1.3)
Gamma Glob SerPl Elph-Mcnc: 1.9 g/dL — ABNORMAL HIGH (ref 0.4–1.8)
Globulin, Total: 3.7 g/dL (ref 2.2–3.9)
IgA: 42 mg/dL — ABNORMAL LOW (ref 64–422)
IgG (Immunoglobin G), Serum: 2345 mg/dL — ABNORMAL HIGH (ref 586–1602)
IgM (Immunoglobulin M), Srm: 19 mg/dL — ABNORMAL LOW (ref 26–217)
M Protein SerPl Elph-Mcnc: 1.6 g/dL — ABNORMAL HIGH
Total Protein ELP: 7.5 g/dL (ref 6.0–8.5)

## 2022-08-17 ENCOUNTER — Other Ambulatory Visit: Payer: Self-pay

## 2022-08-17 ENCOUNTER — Inpatient Hospital Stay (HOSPITAL_BASED_OUTPATIENT_CLINIC_OR_DEPARTMENT_OTHER): Payer: HMO | Admitting: Physician Assistant

## 2022-08-17 VITALS — BP 153/66 | HR 73 | Temp 97.9°F | Resp 18 | Wt 213.0 lb

## 2022-08-17 DIAGNOSIS — C9000 Multiple myeloma not having achieved remission: Secondary | ICD-10-CM | POA: Diagnosis not present

## 2022-08-17 DIAGNOSIS — D472 Monoclonal gammopathy: Secondary | ICD-10-CM | POA: Diagnosis not present

## 2022-08-18 NOTE — Progress Notes (Signed)
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 Leonides Schanz. Kappa 77.3, Lambda 6.6, ratio 11.71.  09/25/2020: met survey shows no clear lytic lesions 10/01/2020: BmBx showed mildly hypercellular marrow involved by kappa-restricted plasma cell neoplasm (20%)  Interval History:  Diane Daugherty 82 y.o. female with medical history significant for smoldering multiple myeloma who presents for a follow up visit. The patient's last visit was on 02/10/2022. In the interim since the last visit she has had no major changes in her health.  On exam today Diane Daugherty reports that she is doing well without any new or concerning symptoms.  Her appetite and energy levels are unchanged unchanged.  She denies any new bone or back pain.  She has GI symptoms including nausea, or bowel habit changes.  She has no other signs of bleeding or bruising.  She denies fevers, chills, night sweats, shortness of breath, chest pain or cough.  A full 10 point ROS is listed below.  MEDICAL HISTORY:  Past Medical History:  Diagnosis Date   Anemia 09/24/1968   "after childbirth"   Arthritis    "feels like all over"   Carpal tunnel syndrome    Carpal tunnel syndrome    left   Cataract    immature on right   Chronic bronchitis (HCC)    "get it most years" (10/08/2014)   Chronic kidney disease    stage III, per Pt   Family history of anesthesia complication    daughter gets sick   History of blood transfusion    "w/one of my back OR's"   Hypertension    takes Hyzaar daily   Joint pain    "all over"   Joint swelling    Muscle spasms of lower extremity    takes Baclofen daily prn   Nocturia    OSA on CPAP    Peripheral vascular  disease (HCC)    PFO (patent foramen ovale)    positive saline bubble study 05/12/09 ([place])   Pneumonia 2010/ 2011   PONV (postoperative nausea and vomiting)    woke up during hysterectomy   Shortness of breath    with exertion;Albuterol prn   Urinary frequency     SURGICAL HISTORY: Past Surgical History:  Procedure Laterality Date   ABDOMINAL HYSTERECTOMY  1970's?   APPENDECTOMY     BACK SURGERY     BREAST BIOPSY Right 1990's   CESAREAN SECTION  1962; 1963; 1970   CHEST TUBE INSERTION  80's   d/t pneumothorax   COLONOSCOPY     FRACTURE SURGERY     HARDWARE REMOVAL Right 1990's   "took screw out of my lower leg ~ 1 yr after putting it in"   HEMILAMINOTOMY LUMBAR SPINE  02/2002   L5; decompression of the L5 and S1 nerve root; synovial cyst excision/notes 06/09/2010   KNEE ARTHROSCOPY Right ?2013   LUMBAR LAMINECTOMY/DECOMPRESSION MICRODISCECTOMY  02/2010   POSTERIOR LUMBAR FUSION  05/2003   L4-5 and S-1    TIBIA FRACTURE SURGERY Right 1990's   "put screw in"   TOTAL KNEE ARTHROPLASTY Right 08/28/2012   Procedure: TOTAL KNEE ARTHROPLASTY;  Surgeon: Valeria Batman, MD;  Location: [facility] OR;  Service: Orthopedics;  Laterality: Right;   TOTAL KNEE ARTHROPLASTY Left 10/07/2014  TOTAL KNEE ARTHROPLASTY Left 10/07/2014   Procedure: TOTAL KNEE ARTHROPLASTY;  Surgeon: Valeria Batman, MD;  Location: [facility] OR;  Service: Orthopedics;  Laterality: Left;   TOTAL KNEE REVISION Left 10/08/2021   Procedure: LEFT TOTAL KNEE REVISION;  Surgeon: Kathryne Hitch, MD;  Location: [facility] ORS;  Service: Orthopedics;  Laterality: Left;    SOCIAL HISTORY: Social History   Socioeconomic History   Marital status: Divorced    Spouse name: Not on file   Number of children: Not on file   Years of education: Not on file   Highest education level: Not on file  Occupational History   Not on file  Tobacco Use   Smoking status: Never   Smokeless tobacco: Never  Vaping Use   Vaping status: Never Used   Substance and Sexual Activity   Alcohol use: No   Drug use: No   Sexual activity: Not Currently  Other Topics Concern   Not on file  Social History Narrative   Not on file   Social Determinants of Health   Financial Resource Strain: Low Risk  (04/11/2022)   Overall Financial Resource Strain (CARDIA)    Difficulty of Paying Living Expenses: Not hard at all  Food Insecurity: No Food Insecurity (04/11/2022)   Hunger Vital Sign    Worried About Running Out of Food in the Last Year: Never true    Ran Out of Food in the Last Year: Never true  Transportation Needs: No Transportation Needs (04/11/2022)   PRAPARE - Administrator, Civil Service (Medical): No    Lack of Transportation (Non-Medical): No  Physical Activity: Insufficiently Active (04/11/2022)   Exercise Vital Sign    Days of Exercise per Week: 3 days    Minutes of Exercise per Session: 10 min  Stress: No Stress Concern Present (04/11/2022)   [employer] of Occupational Health - Occupational Stress Questionnaire    Feeling of Stress : Only a little  Social Connections: Socially Isolated (04/11/2022)   Social Connection and Isolation Panel [NHANES]    Frequency of Communication with Friends and Family: Once a week    Frequency of Social Gatherings with Friends and Family: Once a week    Attends Religious Services: 1 to 4 times per year    Active Member of [employer] or Organizations: No    Attends Banker Meetings: Never    Marital Status: Divorced  Catering manager Violence: Not At Risk (04/12/2022)   Humiliation, Afraid, Rape, and Kick questionnaire    Fear of Current or Ex-Partner: No    Emotionally Abused: No    Physically Abused: No    Sexually Abused: No    FAMILY HISTORY: No family history on file.  ALLERGIES:  is allergic to levaquin [levofloxacin], morphine and codeine, other, oxycodone, and naprosyn [naproxen].  MEDICATIONS:  Current Outpatient Medications  Medication Sig Dispense  Refill   acetaminophen (TYLENOL) 650 MG CR tablet Take 650-1,300 mg by mouth every 8 (eight) hours as needed for pain.     amLODipine (NORVASC) 5 MG tablet Take 1 tablet (5 mg total) by mouth daily. 90 tablet 3   baclofen (LIORESAL) 10 MG tablet Take 1 tablet (10 mg total) by mouth 3 (three) times daily as needed for muscle spasms. 20 tablet 0   Calcium Citrate-Vitamin D (CALCIUM CITRATE + D PO) Take 1 tablet by mouth daily.     CHELATED IRON PO Take 27 mg by mouth daily.     fluticasone (FLONASE) 50  MCG/ACT nasal spray Place 2 sprays into both nostrils daily as needed for allergies or rhinitis. (Patient not taking: Reported on 04/12/2022) 16 g 1   Krill Oil 300 MG CAPS Take 300 mg by mouth daily.     Multiple Vitamins-Minerals (CENTRUM SILVER 50+WOMEN PO) Take 1 tablet by mouth daily.     valsartan (DIOVAN) 320 MG tablet Take 320 mg by mouth daily.     No current facility-administered medications for this visit.    REVIEW OF SYSTEMS:   Constitutional: ( - ) fevers, ( - )  chills , ( - ) night sweats Eyes: ( - ) blurriness of vision, ( - ) double vision, ( - ) watery eyes Ears, nose, mouth, throat, and face: ( - ) mucositis, ( - ) sore throat Respiratory: ( - ) cough, ( - ) dyspnea, ( - ) wheezes Cardiovascular: ( - ) palpitation, ( - ) chest discomfort, ( - ) lower extremity swelling Gastrointestinal:  ( - ) nausea, ( - ) heartburn, ( - ) change in bowel habits Skin: ( - ) abnormal skin rashes Lymphatics: ( - ) new lymphadenopathy, ( - ) easy bruising Neurological: ( - ) numbness, ( - ) tingling, ( - ) new weaknesses Behavioral/Psych: ( - ) mood change, ( - ) new changes  All other systems were reviewed with the patient and are negative.  PHYSICAL EXAMINATION:  Vitals:   08/17/22 0950 08/17/22 1043  BP: (!) 157/63 (!) 153/66  Pulse: 73   Resp: 18   Temp: 97.9 F (36.6 C)   SpO2: 95%     Filed Weights   08/17/22 0950  Weight: 213 lb (96.6 kg)     GENERAL: Well-appearing  elderly African-American female, alert, no distress and comfortable SKIN: skin color, texture, turgor are normal, no rashes or significant lesions EYES: conjunctiva are pink and non-injected, sclera clear LUNGS: clear to auscultation and percussion with normal breathing effort HEART: regular rate & rhythm and no murmurs and no lower extremity edema Musculoskeletal: no cyanosis of digits and no clubbing  PSYCH: alert & oriented x 3, fluent speech NEURO: no focal motor/sensory deficits  LABORATORY DATA:  I have reviewed the data as listed    Latest Ref Rng & Units 08/11/2022    9:41 AM 05/12/2022    9:47 AM 02/10/2022   10:36 AM  CBC  WBC 4.0 - 10.5 K/uL 6.9  6.0  6.8   Hemoglobin 12.0 - 15.0 g/dL 42.7  06.2  37.6   Hematocrit 36.0 - 46.0 % 32.5  33.7  35.1   Platelets 150 - 400 K/uL 179  196  218        Latest Ref Rng & Units 08/11/2022    9:41 AM 05/12/2022    9:47 AM 02/10/2022   10:36 AM  CMP  Glucose 70 - 99 mg/dL 86  98  85   BUN 8 - 23 mg/dL 24  19  14    Creatinine 0.44 - 1.00 mg/dL 2.83  1.51  7.61   Sodium 135 - 145 mmol/L 138  137  136   Potassium 3.5 - 5.1 mmol/L 4.3  3.9  4.0   Chloride 98 - 111 mmol/L 104  103  101   CO2 22 - 32 mmol/L 28  27  29    Calcium 8.9 - 10.3 mg/dL 9.6  60.7  37.1   Total Protein 6.5 - 8.1 g/dL 7.9  8.2  8.3   Total Bilirubin 0.3 - 1.2 mg/dL  0.4  0.5  0.5   Alkaline Phos 38 - 126 U/L 63  61  68   AST 15 - 41 U/L 14  15  16    ALT 0 - 44 U/L 11  9  10      Lab Results  Component Value Date   MPROTEIN 1.6 (H) 08/11/2022   MPROTEIN 1.5 (H) 05/12/2022   MPROTEIN 1.7 (H) 02/10/2022   Lab Results  Component Value Date   KPAFRELGTCHN 86.5 (H) 08/11/2022   KPAFRELGTCHN 76.7 (H) 05/12/2022   KPAFRELGTCHN 90.3 (H) 02/10/2022   LAMBDASER 9.0 08/11/2022   LAMBDASER 11.7 05/12/2022   LAMBDASER 9.8 02/10/2022   KAPLAMBRATIO 9.61 (H) 08/11/2022   KAPLAMBRATIO 6.56 (H) 05/12/2022   KAPLAMBRATIO 9.21 (H) 02/10/2022     RADIOGRAPHIC  STUDIES: No results found.  ASSESSMENT & PLAN Diane Daugherty is a 82 y.o. female with medical history significant for smoldering multiple myeloma who presents for a follow up visit.  #Smoldering Multiple Myeloma -- The patient currently meets one of the three 20-2-20 criteria (bmbx with 20% plasma cells). Two criteria are required for treatment.  --labs from 08/11/2022 reviewed with patient. WBC 6.9,  Hgb 11.2, MCV 93.1, and platelets of 179. Myeloma labs show stable M protein measuring 1.6 g/dL. Kappa light chain elevated at 86.5, lambda light chain 9.0, ratio 9.61. -- No clear indication for treatment as long as the patient does not meet this criteria or any of the CRAB criteria --Continue on observation. We will order SPEP, UPEP, serum free light chains, LDH, CMP and CBC with each visit.  --Most recent bone scan from 02/18/2022 showed no definitive lytic bone lesion. Next scan due at next visit.  --Recommend follow-up visit in 6 months time   Orders Placed This Encounter  Procedures   DG Bone Survey Met    Standing Status:   Future    Standing Expiration Date:   08/18/2023    Order Specific Question:   Reason for Exam (SYMPTOM  OR DIAGNOSIS REQUIRED)    Answer:   smoldering multiple myeloma. Evaluate for lytic lesions.    Order Specific Question:   Preferred imaging location?    Answer:   [facility]   CBC with Differential (Cancer Center Only)    Standing Status:   Future    Standing Expiration Date:   08/18/2023   CMP (Cancer Center only)    Standing Status:   Future    Standing Expiration Date:   08/18/2023   Lactate dehydrogenase (LDH)    Standing Status:   Future    Standing Expiration Date:   08/18/2023   Multiple Myeloma Panel (SPEP&IFE w/QIG)    Standing Status:   Future    Standing Expiration Date:   08/18/2023   Kappa/lambda light chains    Standing Status:   Future    Standing Expiration Date:   08/18/2023   24-Hr Ur UPEP/UIFE/Light Chains/TP    Standing Status:    Future    Standing Expiration Date:   08/18/2023   All questions were answered. The patient knows to call the clinic with any problems, questions or concerns.  I have spent a total of 25 minutes minutes of face-to-face and non-face-to-face time, preparing to see the patient,performing a medically appropriate examination, counseling and educating the patient,documenting clinical information in the electronic health record, and care coordination.   Georga Kaufmann PA-C Dept of Hematology and Oncology [facility] Phone: [phone]   08/18/2022  3:22 PM

## 2022-08-29 ENCOUNTER — Encounter: Payer: Self-pay | Admitting: Emergency Medicine

## 2022-08-29 ENCOUNTER — Ambulatory Visit (INDEPENDENT_AMBULATORY_CARE_PROVIDER_SITE_OTHER): Payer: HMO | Admitting: Emergency Medicine

## 2022-08-29 VITALS — BP 134/88 | HR 79 | Temp 98.0°F | Ht 61.0 in | Wt 212.4 lb

## 2022-08-29 DIAGNOSIS — N1831 Chronic kidney disease, stage 3a: Secondary | ICD-10-CM

## 2022-08-29 DIAGNOSIS — I10 Essential (primary) hypertension: Secondary | ICD-10-CM

## 2022-08-29 DIAGNOSIS — D472 Monoclonal gammopathy: Secondary | ICD-10-CM

## 2022-08-29 DIAGNOSIS — G4733 Obstructive sleep apnea (adult) (pediatric): Secondary | ICD-10-CM

## 2022-08-29 NOTE — Assessment & Plan Note (Signed)
Diet and nutrition discussed.  Advised to decrease amount of daily carbohydrate intake and daily calories and increase amount of plant-based protein in her diet. 

## 2022-08-29 NOTE — Patient Instructions (Signed)
Health Maintenance After Age 80 After age 80, you are at a higher risk for certain long-term diseases and infections as well as injuries from falls. Falls are a major cause of broken bones and head injuries in people who are older than age 80. Getting regular preventive care can help to keep you healthy and well. Preventive care includes getting regular testing and making lifestyle changes as recommended by your health care provider. Talk with your health care provider about: Which screenings and tests you should have. A screening is a test that checks for a disease when you have no symptoms. A diet and exercise plan that is right for you. What should I know about screenings and tests to prevent falls? Screening and testing are the best ways to find a health problem early. Early diagnosis and treatment give you the best chance of managing medical conditions that are common after age 80. Certain conditions and lifestyle choices may make you more likely to have a fall. Your health care provider may recommend: Regular vision checks. Poor vision and conditions such as cataracts can make you more likely to have a fall. If you wear glasses, make sure to get your prescription updated if your vision changes. Medicine review. Work with your health care provider to regularly review all of the medicines you are taking, including over-the-counter medicines. Ask your health care provider about any side effects that may make you more likely to have a fall. Tell your health care provider if any medicines that you take make you feel dizzy or sleepy. Strength and balance checks. Your health care provider may recommend certain tests to check your strength and balance while standing, walking, or changing positions. Foot health exam. Foot pain and numbness, as well as not wearing proper footwear, can make you more likely to have a fall. Screenings, including: Osteoporosis screening. Osteoporosis is a condition that causes  the bones to get weaker and break more easily. Blood pressure screening. Blood pressure changes and medicines to control blood pressure can make you feel dizzy. Depression screening. You may be more likely to have a fall if you have a fear of falling, feel depressed, or feel unable to do activities that you used to do. Alcohol use screening. Using too much alcohol can affect your balance and may make you more likely to have a fall. Follow these instructions at home: Lifestyle Do not drink alcohol if: Your health care provider tells you not to drink. If you drink alcohol: Limit how much you have to: 0-1 drink a day for women. 0-2 drinks a day for men. Know how much alcohol is in your drink. In the [place] one drink equals one 12 oz bottle of beer (355 mL), one 5 oz glass of wine (148 mL), or one 1 oz glass of hard liquor (44 mL). Do not use any products that contain nicotine or tobacco. These products include cigarettes, chewing tobacco, and vaping devices, such as e-cigarettes. If you need help quitting, ask your health care provider. Activity  Follow a regular exercise program to stay fit. This will help you maintain your balance. Ask your health care provider what types of exercise are appropriate for you. If you need a cane or walker, use it as recommended by your health care provider. Wear supportive shoes that have nonskid soles. Safety  Remove any tripping hazards, such as rugs, cords, and clutter. Install safety equipment such as grab bars in bathrooms and safety rails on stairs. Keep rooms and walkways   well-lit. General instructions Talk with your health care provider about your risks for falling. Tell your health care provider if: You fall. Be sure to tell your health care provider about all falls, even ones that seem minor. You feel dizzy, tiredness (fatigue), or off-balance. Take over-the-counter and prescription medicines only as told by your health care provider. These include  supplements. Eat a healthy diet and maintain a healthy weight. A healthy diet includes low-fat dairy products, low-fat (lean) meats, and fiber from whole grains, beans, and lots of fruits and vegetables. Stay current with your vaccines. Schedule regular health, dental, and eye exams. Summary Having a healthy lifestyle and getting preventive care can help to protect your health and wellness after age 80. Screening and testing are the best way to find a health problem early and help you avoid having a fall. Early diagnosis and treatment give you the best chance for managing medical conditions that are more common for people who are older than age 80. Falls are a major cause of broken bones and head injuries in people who are older than age 80. Take precautions to prevent a fall at home. Work with your health care provider to learn what changes you can make to improve your health and wellness and to prevent falls. This information is not intended to replace advice given to you by your health care provider. Make sure you discuss any questions you have with your health care provider. Document Revised: 06/01/2020 Document Reviewed: 06/01/2020 Elsevier Patient Education  2024 [employer]  

## 2022-08-29 NOTE — Assessment & Plan Note (Signed)
Stable chronic condition Advised to stay well-hydrated and avoid NSAIDs

## 2022-08-29 NOTE — Progress Notes (Signed)
Leanord Asal Shetley 82 y.o.   Chief Complaint  Patient presents with   Medical Management of Chronic Issues    f/u appt, no concerns     HISTORY OF PRESENT ILLNESS: This is a 82 y.o. female with history of multiple myeloma and hypertension, here for follow-up. Follows up with oncologist on a regular basis Most recent blood work results on 08/11/2022 reviewed with patient.  Stable results.  No concerns. Has no other complaints or medical concerns today. Eating well.  Good appetite.  Sleeps well with CPAP machine. Urinating well and moving bowels on a daily basis.  BP Readings from Last 3 Encounters:  08/29/22 134/88  08/17/22 (!) 153/66  02/28/22 132/70     HPI   Prior to Admission medications   Medication Sig Start Date End Date Taking? Authorizing Provider  acetaminophen (TYLENOL) 650 MG CR tablet Take 650-1,300 mg by mouth every 8 (eight) hours as needed for pain.   Yes [provider]  amLODipine (NORVASC) 5 MG tablet Take 1 tablet (5 mg total) by mouth daily. 02/28/22 02/23/23 Yes , Eilleen Kempf, MD  baclofen (LIORESAL) 10 MG tablet Take 1 tablet (10 mg total) by mouth 3 (three) times daily as needed for muscle spasms. 10/11/21  Yes Kathryne Hitch, MD  Calcium Citrate-Vitamin D (CALCIUM CITRATE + D PO) Take 1 tablet by mouth daily.   Yes [provider]  CHELATED IRON PO Take 27 mg by mouth daily.   Yes [provider]  Boris Lown Oil 300 MG CAPS Take 300 mg by mouth daily.   Yes [provider]  Multiple Vitamins-Minerals (CENTRUM SILVER 50+WOMEN PO) Take 1 tablet by mouth daily.   Yes [provider]  valsartan (DIOVAN) 320 MG tablet Take 320 mg by mouth daily.   Yes [provider]  fluticasone (FLONASE) 50 MCG/ACT nasal spray Place 2 sprays into both nostrils daily as needed for allergies or rhinitis. 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   Stage 3a chronic kidney disease (HCC) 05/27/2021   History of multiple myeloma 05/27/2021   Primary osteoarthritis of left knee 10/07/2014   History of left knee replacement 10/07/2014   Asthma, chronic 08/30/2012   Essential hypertension 08/30/2012   Osteoarthritis of right knee 08/30/2012   Sleep apnea 08/30/2012   Severe obesity (BMI >= 40) (HCC) 08/30/2012    Past Medical History:  Diagnosis Date   Anemia 09/24/1968   "after childbirth"   Arthritis    "feels like all over"   Carpal tunnel syndrome    Carpal tunnel syndrome    left   Cataract    immature on right   Chronic bronchitis (HCC)    "get it most years" (10/08/2014)   Chronic kidney disease    stage III, per Pt   Family history of anesthesia complication    daughter gets sick   History of blood transfusion    "w/one of my back OR's"   Hypertension    takes  Hyzaar daily   Joint pain    "all over"   Joint swelling    Muscle spasms of lower extremity    takes Baclofen daily prn   Nocturia    OSA on CPAP    Peripheral vascular disease (HCC)    PFO (patent foramen ovale)    positive saline bubble study 05/12/09 ([facility])   Pneumonia 2010/ 2011   PONV (postoperative nausea and vomiting)    woke up during hysterectomy   Shortness of breath    with exertion;Albuterol prn   Urinary frequency     Past Surgical History:  Procedure Laterality Date   ABDOMINAL HYSTERECTOMY  1970's?   APPENDECTOMY     BACK SURGERY     BREAST BIOPSY Right 1990's   CESAREAN SECTION  1962; 1963; 1970   CHEST TUBE  INSERTION  80's   d/t pneumothorax   COLONOSCOPY     FRACTURE SURGERY     HARDWARE REMOVAL Right 1990's   "took screw out of my lower leg ~ 1 yr after putting it in"   HEMILAMINOTOMY LUMBAR SPINE  02/2002   L5; decompression of the L5 and S1 nerve root; synovial cyst excision/notes 06/09/2010   KNEE ARTHROSCOPY Right ?2013   LUMBAR LAMINECTOMY/DECOMPRESSION MICRODISCECTOMY  02/2010   POSTERIOR LUMBAR FUSION  05/2003   L4-5 and S-1    TIBIA FRACTURE SURGERY Right 1990's   "put screw in"   TOTAL KNEE ARTHROPLASTY Right 08/28/2012   Procedure: TOTAL KNEE ARTHROPLASTY;  Surgeon: Valeria Batman, MD;  Location: [facility] OR;  Service: Orthopedics;  Laterality: Right;   TOTAL KNEE ARTHROPLASTY Left 10/07/2014   TOTAL KNEE ARTHROPLASTY Left 10/07/2014   Procedure: TOTAL KNEE ARTHROPLASTY;  Surgeon: Valeria Batman, MD;  Location: [facility] OR;  Service: Orthopedics;  Laterality: Left;   TOTAL KNEE REVISION Left 10/08/2021   Procedure: LEFT TOTAL KNEE REVISION;  Surgeon: Kathryne Hitch, MD;  Location: [facility] ORS;  Service: Orthopedics;  Laterality: Left;    Social History   Socioeconomic History   Marital status: Divorced    Spouse name: Not on file   Number of children: Not on file   Years of education: Not on file   Highest education level: Not on file  Occupational History   Not on file  Tobacco Use   Smoking status: Never   Smokeless tobacco: Never  Vaping Use   Vaping status: Never Used  Substance and Sexual Activity   Alcohol use: No   Drug use: No   Sexual activity: Not Currently  Other Topics Concern   Not on file  Social History Narrative   Not on file   Social Determinants of Health   Financial Resource Strain: Low Risk  (04/11/2022)   Overall Financial Resource Strain (CARDIA)    Difficulty of Paying Living Expenses: Not hard at all  Food Insecurity: No Food Insecurity (04/11/2022)   Hunger Vital Sign    Worried About Running Out of Food in the Last Year: Never true    Ran  Out of Food in the Last Year: Never true  Transportation Needs: No Transportation Needs (04/11/2022)   PRAPARE - Administrator, Civil Service (Medical): No    Lack of Transportation (Non-Medical): No  Physical Activity: Insufficiently Active (04/11/2022)   Exercise Vital Sign    Days of Exercise per Week: 3 days    Minutes of Exercise per Session: 10 min  Stress: No Stress Concern Present (04/11/2022)   [employer] of Occupational  Health - Occupational Stress Questionnaire    Feeling of Stress : Only a little  Social Connections: Socially Isolated (04/11/2022)   Social Connection and Isolation Panel [NHANES]    Frequency of Communication with Friends and Family: Once a week    Frequency of Social Gatherings with Friends and Family: Once a week    Attends Religious Services: 1 to 4 times per year    Active Member of [employer] or Organizations: No    Attends Banker Meetings: Never    Marital Status: Divorced  Catering manager Violence: Not At Risk (04/12/2022)   Humiliation, Afraid, Rape, and Kick questionnaire    Fear of Current or Ex-Partner: No    Emotionally Abused: No    Physically Abused: No    Sexually Abused: No    No family history on file.   Review of Systems  Constitutional: Negative.  Negative for chills and fever.  HENT: Negative.  Negative for congestion and sore throat.   Respiratory: Negative.  Negative for cough and shortness of breath.   Cardiovascular: Negative.  Negative for chest pain and palpitations.  Gastrointestinal:  Negative for abdominal pain, diarrhea, nausea and vomiting.  Genitourinary: Negative.  Negative for dysuria and hematuria.  Skin: Negative.  Negative for rash.  Neurological: Negative.  Negative for dizziness and headaches.  All other systems reviewed and are negative.   Vitals:   08/29/22 1103  BP: 134/88  Pulse: 79  Temp: 98 F (36.7 C)  SpO2: 98%    Physical Exam Vitals reviewed.  Constitutional:       Appearance: Normal appearance.  HENT:     Head: Normocephalic.     Mouth/Throat:     Mouth: Mucous membranes are moist.     Pharynx: Oropharynx is clear.  Eyes:     Extraocular Movements: Extraocular movements intact.     Pupils: Pupils are equal, round, and reactive to light.  Cardiovascular:     Rate and Rhythm: Normal rate and regular rhythm.     Pulses: Normal pulses.     Heart sounds: Normal heart sounds.  Pulmonary:     Effort: Pulmonary effort is normal.     Breath sounds: Normal breath sounds.  Musculoskeletal:     Cervical back: No tenderness.  Lymphadenopathy:     Cervical: No cervical adenopathy.  Skin:    General: Skin is warm and dry.     Capillary Refill: Capillary refill takes less than 2 seconds.  Neurological:     General: No focal deficit present.     Mental Status: She is alert and oriented to person, place, and time.  Psychiatric:        Mood and Affect: Mood normal.        Behavior: Behavior normal.      ASSESSMENT & PLAN: A total of 47 minutes was spent with the patient and counseling/coordination of care regarding preparing for this visit, review of most recent office visit notes, review of most recent oncologist office visit notes, review of multiple chronic medical conditions under management, review of all medications, review of most recent blood work results, education on nutrition, review of health maintenance items, prognosis, documentation, and need for follow-up.  Problem List Items Addressed This Visit       Cardiovascular and Mediastinum   Essential hypertension    BP Readings from Last 3 Encounters:  08/29/22 134/88  08/17/22 (!) 153/66  02/28/22 132/70  Well-controlled hypertension. Continue amlodipine 5 mg daily and valsartan 320 mg  daily Cardiovascular risks associated with hypertension discussed.         Respiratory   OSA on CPAP    Stable on CPAP treatment.        Genitourinary   Stage 3a chronic kidney disease (HCC) -  Primary    Stable chronic condition. Advised to stay well-hydrated and avoid NSAIDs        Other   Severe obesity (BMI >= 40) (HCC) (Chronic)    Diet and nutrition discussed. Advised to decrease amount of daily carbohydrate intake and daily calories and increase amount of plant-based protein in her diet      Smoldering multiple myeloma    Stable without complications. No recent infections. Follows up with oncologist on a regular basis Last office visit notes reviewed      Patient Instructions  Health Maintenance After Age 44 After age 30, you are at a higher risk for certain long-term diseases and infections as well as injuries from falls. Falls are a major cause of broken bones and head injuries in people who are older than age 65. Getting regular preventive care can help to keep you healthy and well. Preventive care includes getting regular testing and making lifestyle changes as recommended by your health care provider. Talk with your health care provider about: Which screenings and tests you should have. A screening is a test that checks for a disease when you have no symptoms. A diet and exercise plan that is right for you. What should I know about screenings and tests to prevent falls? Screening and testing are the best ways to find a health problem early. Early diagnosis and treatment give you the best chance of managing medical conditions that are common after age 61. Certain conditions and lifestyle choices may make you more likely to have a fall. Your health care provider may recommend: Regular vision checks. Poor vision and conditions such as cataracts can make you more likely to have a fall. If you wear glasses, make sure to get your prescription updated if your vision changes. Medicine review. Work with your health care provider to regularly review all of the medicines you are taking, including over-the-counter medicines. Ask your health care provider about any side effects  that may make you more likely to have a fall. Tell your health care provider if any medicines that you take make you feel dizzy or sleepy. Strength and balance checks. Your health care provider may recommend certain tests to check your strength and balance while standing, walking, or changing positions. Foot health exam. Foot pain and numbness, as well as not wearing proper footwear, can make you more likely to have a fall. Screenings, including: Osteoporosis screening. Osteoporosis is a condition that causes the bones to get weaker and break more easily. Blood pressure screening. Blood pressure changes and medicines to control blood pressure can make you feel dizzy. Depression screening. You may be more likely to have a fall if you have a fear of falling, feel depressed, or feel unable to do activities that you used to do. Alcohol use screening. Using too much alcohol can affect your balance and may make you more likely to have a fall. Follow these instructions at home: Lifestyle Do not drink alcohol if: Your health care provider tells you not to drink. If you drink alcohol: Limit how much you have to: 0-1 drink a day for women. 0-2 drinks a day for men. Know how much alcohol is in your drink. In the [place] one drink  equals one 12 oz bottle of beer (355 mL), one 5 oz glass of wine (148 mL), or one 1 oz glass of hard liquor (44 mL). Do not use any products that contain nicotine or tobacco. These products include cigarettes, chewing tobacco, and vaping devices, such as e-cigarettes. If you need help quitting, ask your health care provider. Activity  Follow a regular exercise program to stay fit. This will help you maintain your balance. Ask your health care provider what types of exercise are appropriate for you. If you need a cane or walker, use it as recommended by your health care provider. Wear supportive shoes that have nonskid soles. Safety  Remove any tripping hazards, such as rugs,  cords, and clutter. Install safety equipment such as grab bars in bathrooms and safety rails on stairs. Keep rooms and walkways well-lit. General instructions Talk with your health care provider about your risks for falling. Tell your health care provider if: You fall. Be sure to tell your health care provider about all falls, even ones that seem minor. You feel dizzy, tiredness (fatigue), or off-balance. Take over-the-counter and prescription medicines only as told by your health care provider. These include supplements. Eat a healthy diet and maintain a healthy weight. A healthy diet includes low-fat dairy products, low-fat (lean) meats, and fiber from whole grains, beans, and lots of fruits and vegetables. Stay current with your vaccines. Schedule regular health, dental, and eye exams. Summary Having a healthy lifestyle and getting preventive care can help to protect your health and wellness after age 34. Screening and testing are the best way to find a health problem early and help you avoid having a fall. Early diagnosis and treatment give you the best chance for managing medical conditions that are more common for people who are older than age 42. Falls are a major cause of broken bones and head injuries in people who are older than age 9. Take precautions to prevent a fall at home. Work with your health care provider to learn what changes you can make to improve your health and wellness and to prevent falls. This information is not intended to replace advice given to you by your health care provider. Make sure you discuss any questions you have with your health care provider. Document Revised: 06/01/2020 Document Reviewed: 06/01/2020 Elsevier Patient Education  2024 Elsevier Inc.     Edwina Barth, MD [facility]

## 2022-08-29 NOTE — Assessment & Plan Note (Signed)
Stable without complications. No recent infections. Follows up with oncologist on a regular basis Last office visit notes reviewed

## 2022-08-29 NOTE — Assessment & Plan Note (Signed)
Stable on CPAP treatment.

## 2022-08-29 NOTE — Assessment & Plan Note (Signed)
BP Readings from Last 3 Encounters:  08/29/22 134/88  08/17/22 (!) 153/66  02/28/22 132/70  Well-controlled hypertension. Continue amlodipine 5 mg daily and valsartan 320 mg daily Cardiovascular risks associated with hypertension discussed.

## 2022-09-28 ENCOUNTER — Other Ambulatory Visit (INDEPENDENT_AMBULATORY_CARE_PROVIDER_SITE_OTHER): Payer: HMO

## 2022-09-28 ENCOUNTER — Ambulatory Visit (INDEPENDENT_AMBULATORY_CARE_PROVIDER_SITE_OTHER): Payer: HMO | Admitting: Orthopaedic Surgery

## 2022-09-28 DIAGNOSIS — Z96652 Presence of left artificial knee joint: Secondary | ICD-10-CM | POA: Diagnosis not present

## 2022-09-28 NOTE — Progress Notes (Signed)
The patient is an 82 year old female well-known to [place].  We revised a failed left total knee arthroplasty that had aseptic loosening.  This was done in September of last year.  Her right knee has a knee replacement and it that was done in 2014.  It still does not bother her at all but her range of motion is always been severely limited with that right knee.  She does ambulate using a cane.  She says she is doing well overall.  Examination of her left knee shows full extension and flexion to past 90 degrees.  The knee feels ligamentously stable.  Examination of her right knee shows no effusion but her range of motion is not even to 90 degrees of flexion.  2 views of the left knee show well-seated revision arthroplasty with no complicating features.  2 views of the right knee shows slight varus malalignment of the knee and some cystic changes but this has not changed when compared to x-rays from a year and a half ago.  There is no effusion and again the patient is asymptomatic.  At this point the patient is basically stable.  If she does develop any issues with the right knee or the left knee she knows to let us know.  Right now she is doing well enough to follow-up as needed.  She does note that if anything worsens to reach out to [place] as soon as she can.

## 2022-10-12 DIAGNOSIS — H35033 Hypertensive retinopathy, bilateral: Secondary | ICD-10-CM | POA: Diagnosis not present

## 2022-10-12 DIAGNOSIS — H33321 Round hole, right eye: Secondary | ICD-10-CM | POA: Diagnosis not present

## 2022-10-12 DIAGNOSIS — H16223 Keratoconjunctivitis sicca, not specified as Sjogren's, bilateral: Secondary | ICD-10-CM | POA: Diagnosis not present

## 2022-10-26 ENCOUNTER — Encounter: Payer: Self-pay | Admitting: Emergency Medicine

## 2022-10-26 ENCOUNTER — Ambulatory Visit (INDEPENDENT_AMBULATORY_CARE_PROVIDER_SITE_OTHER): Payer: HMO | Admitting: Emergency Medicine

## 2022-10-26 VITALS — BP 132/80 | HR 97 | Temp 98.4°F | Ht 61.0 in | Wt 215.5 lb

## 2022-10-26 DIAGNOSIS — J0100 Acute maxillary sinusitis, unspecified: Secondary | ICD-10-CM | POA: Diagnosis not present

## 2022-10-26 DIAGNOSIS — R0981 Nasal congestion: Secondary | ICD-10-CM | POA: Diagnosis not present

## 2022-10-26 MED ORDER — AMOXICILLIN-POT CLAVULANATE 875-125 MG PO TABS
1.0000 | ORAL_TABLET | Freq: Two times a day (BID) | ORAL | 0 refills | Status: AC
Start: 2022-10-26 — End: 2022-11-02

## 2022-10-26 NOTE — Assessment & Plan Note (Signed)
Continue saline nasal spray Avoid oral nasal decongestants Advised to rest and stay well-hydrated

## 2022-10-26 NOTE — Patient Instructions (Signed)

## 2022-10-26 NOTE — Progress Notes (Signed)
Diane Daugherty 82 y.o.   Chief Complaint  Patient presents with   Sore Throat    Sinus drainage, stiff neck, sore throat, dizzy at times, headache off and on     HISTORY OF PRESENT ILLNESS: Acute problem visit today. This is a 82 y.o. female A1A complaining of flulike symptoms that started 3 weeks ago with a stiff neck and headache followed by sore throat and congestion.  Stiff neck and headache are better and gone.  Still having congestion with facial fullness and sore throat.  No other associated symptoms.  Denies fever or chills.  Able to eat and drink.  Denies nausea or vomiting. No other complaints or medical concerns today.   Sore Throat  Associated symptoms include congestion. Pertinent negatives include no abdominal pain, coughing, diarrhea, headaches, shortness of breath or vomiting.     Prior to Admission medications   Medication Sig Start Date End Date Taking? Authorizing Provider  acetaminophen (TYLENOL) 650 MG CR tablet Take 650-1,300 mg by mouth every 8 (eight) hours as needed for pain.   Yes [provider]  amLODipine (NORVASC) 5 MG tablet Take 1 tablet (5 mg total) by mouth daily. 02/28/22 02/23/23 Yes Marinell Igarashi, Eilleen Kempf, MD  baclofen (LIORESAL) 10 MG tablet Take 1 tablet (10 mg total) by mouth 3 (three) times daily as needed for muscle spasms. 10/11/21  Yes Kathryne Hitch, MD  Calcium Citrate-Vitamin D (CALCIUM CITRATE + D PO) Take 1 tablet by mouth daily.   Yes [provider]  CHELATED IRON PO Take 27 mg by mouth daily.   Yes [provider]  Boris Lown Oil 300 MG CAPS Take 300 mg by mouth daily.   Yes [provider]  Multiple Vitamins-Minerals (CENTRUM SILVER 50+WOMEN PO) Take 1 tablet by mouth daily.   Yes [provider]  valsartan (DIOVAN) 320 MG tablet Take 320 mg by mouth daily.   Yes [provider]  fluticasone (FLONASE) 50 MCG/ACT nasal spray Place 2 sprays into both nostrils daily as needed for  allergies or rhinitis. 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   Stage 3a chronic kidney disease (HCC) 05/27/2021   History of multiple myeloma 05/27/2021   Primary osteoarthritis of left knee 10/07/2014   History of left knee replacement 10/07/2014   Asthma, chronic 08/30/2012   Essential hypertension 08/30/2012   Osteoarthritis of right knee 08/30/2012   OSA on CPAP 08/30/2012   Severe obesity (BMI >= 40) (HCC) 08/30/2012    Past Medical History:  Diagnosis Date   Anemia 09/24/1968   "after childbirth"   Arthritis    "feels like all over"   Carpal tunnel syndrome    Carpal tunnel syndrome    left   Cataract    immature on right   Chronic bronchitis (HCC)    "get it most years" (10/08/2014)   Chronic kidney disease    stage III, per Pt   Family history of anesthesia complication    daughter gets sick  History of blood transfusion    "w/one of my back OR's"   Hypertension    takes Hyzaar daily   Joint pain    "all over"   Joint swelling    Muscle spasms of lower extremity    takes Baclofen daily prn   Nocturia    OSA on CPAP    Peripheral vascular disease (HCC)    PFO (patent foramen ovale)    positive saline bubble study 05/12/09 ([facility])   Pneumonia 2010/ 2011   PONV (postoperative nausea and vomiting)    woke up during hysterectomy   Shortness of breath    with exertion;Albuterol prn   Urinary frequency     Past Surgical History:  Procedure Laterality Date   ABDOMINAL HYSTERECTOMY   1970's?   APPENDECTOMY     BACK SURGERY     BREAST BIOPSY Right 1990's   CESAREAN SECTION  1962; 1963; 1970   CHEST TUBE INSERTION  80's   d/t pneumothorax   COLONOSCOPY     FRACTURE SURGERY     HARDWARE REMOVAL Right 1990's   "took screw out of my lower leg ~ 1 yr after putting it in"   HEMILAMINOTOMY LUMBAR SPINE  02/2002   L5; decompression of the L5 and S1 nerve root; synovial cyst excision/notes 06/09/2010   KNEE ARTHROSCOPY Right ?2013   LUMBAR LAMINECTOMY/DECOMPRESSION MICRODISCECTOMY  02/2010   POSTERIOR LUMBAR FUSION  05/2003   L4-5 and S-1    TIBIA FRACTURE SURGERY Right 1990's   "put screw in"   TOTAL KNEE ARTHROPLASTY Right 08/28/2012   Procedure: TOTAL KNEE ARTHROPLASTY;  Surgeon: Valeria Batman, MD;  Location: [facility];  Service: Orthopedics;  Laterality: Right;   TOTAL KNEE ARTHROPLASTY Left 10/07/2014   TOTAL KNEE ARTHROPLASTY Left 10/07/2014   Procedure: TOTAL KNEE ARTHROPLASTY;  Surgeon: Valeria Batman, MD;  Location: [facility] OR;  Service: Orthopedics;  Laterality: Left;   TOTAL KNEE REVISION Left 10/08/2021   Procedure: LEFT TOTAL KNEE REVISION;  Surgeon: Kathryne Hitch, MD;  Location: [facility];  Service: Orthopedics;  Laterality: Left;    Social History   Socioeconomic History   Marital status: Divorced    Spouse name: Not on file   Number of children: Not on file   Years of education: Not on file   Highest education level: 12th grade  Occupational History   Not on file  Tobacco Use   Smoking status: Never   Smokeless tobacco: Never  Vaping Use   Vaping status: Never Used  Substance and Sexual Activity   Alcohol use: No   Drug use: No   Sexual activity: Not Currently  Other Topics Concern   Not on file  Social History Narrative   Not on file   Social Determinants of Health   Financial Resource Strain: Low Risk  (10/22/2022)   Overall Financial Resource Strain (CARDIA)    Difficulty of Paying Living Expenses: Not very hard  Food Insecurity: No  Food Insecurity (10/22/2022)   Hunger Vital Sign    Worried About Running Out of Food in the Last Year: Never true    Ran Out of Food in the Last Year: Never true  Transportation Needs: No Transportation Needs (10/22/2022)   PRAPARE - Administrator, Civil Service (Medical): No    Lack of Transportation (Non-Medical): No  Physical Activity: Insufficiently Active (10/22/2022)   Exercise Vital Sign    Days of Exercise per Week: 4 days    Minutes of Exercise  per Session: 10 min  Stress: No Stress Concern Present (10/22/2022)   [employer] of Occupational Health - Occupational Stress Questionnaire    Feeling of Stress : Only a little  Social Connections: Moderately Integrated (10/22/2022)   Social Connection and Isolation Panel [NHANES]    Frequency of Communication with Friends and Family: Three times a week    Frequency of Social Gatherings with Friends and Family: Once a week    Attends Religious Services: More than 4 times per year    Active Member of [employer] or Organizations: Yes    Attends Banker Meetings: More than 4 times per year    Marital Status: Divorced  Intimate Partner Violence: Not At Risk (04/12/2022)   Humiliation, Afraid, Rape, and Kick questionnaire    Fear of Current or Ex-Partner: No    Emotionally Abused: No    Physically Abused: No    Sexually Abused: No    No family history on file.   Review of Systems  Constitutional: Negative.  Negative for chills and fever.  HENT:  Positive for congestion.   Respiratory: Negative.  Negative for cough and shortness of breath.   Cardiovascular: Negative.  Negative for chest pain and palpitations.  Gastrointestinal:  Negative for abdominal pain, diarrhea, nausea and vomiting.  Genitourinary: Negative.  Negative for dysuria and hematuria.  Skin: Negative.  Negative for rash.  Neurological: Negative.  Negative for dizziness and headaches.  All other systems reviewed and are negative.   Today's  Vitals   10/26/22 1421  BP: 132/80  Pulse: 97  Temp: 98.4 F (36.9 C)  TempSrc: Oral  SpO2: 94%  Weight: 215 lb 8 oz (97.8 kg)  Height: 5\' 1"  (1.549 m)   Body mass index is 40.72 kg/m.   Physical Exam Vitals reviewed.  Constitutional:      Appearance: Normal appearance.  HENT:     Head: Normocephalic.     Nose: Congestion present.     Mouth/Throat:     Mouth: Mucous membranes are moist.     Pharynx: Oropharynx is clear.  Eyes:     Extraocular Movements: Extraocular movements intact.     Conjunctiva/sclera: Conjunctivae normal.     Pupils: Pupils are equal, round, and reactive to light.  Cardiovascular:     Rate and Rhythm: Normal rate and regular rhythm.     Pulses: Normal pulses.     Heart sounds: Normal heart sounds.  Pulmonary:     Effort: Pulmonary effort is normal.     Breath sounds: Normal breath sounds.  Musculoskeletal:     Cervical back: No tenderness.  Lymphadenopathy:     Cervical: No cervical adenopathy.  Skin:    General: Skin is warm and dry.     Capillary Refill: Capillary refill takes less than 2 seconds.  Neurological:     General: No focal deficit present.     Mental Status: She is alert and oriented to person, place, and time.  Psychiatric:        Mood and Affect: Mood normal.        Behavior: Behavior normal.      ASSESSMENT & PLAN: A total of 32 minutes was spent with the patient and counseling/coordination of care regarding preparing for this visit, review of most recent office visit notes, review of multiple chronic medical conditions under management, review of all medications, diagnosis of sinus infection and need for antibiotics, management of sinus congestion, prognosis, documentation, and need for follow-up if no better or  worse during the next several days.  Problem List Items Addressed This Visit       Respiratory   Acute non-recurrent maxillary sinusitis - Primary    Clinically stable.  No red flag signs or  symptoms. Possible secondary bacterial sinus infection Recommend to start Augmentin 875 mg twice a day for 7 days Advised to contact the office if no better or worse during the next several days.      Relevant Medications   amoxicillin-clavulanate (AUGMENTIN) 875-125 MG tablet   Sinus congestion    Continue saline nasal spray Avoid oral nasal decongestants Advised to rest and stay well-hydrated      Patient Instructions  Sinus Infection, Adult A sinus infection is soreness and swelling (inflammation) of your sinuses. Sinuses are hollow spaces in the bones around your face. They are located: Around your eyes. In the middle of your forehead. Behind your nose. In your cheekbones. Your sinuses and nasal passages are lined with a fluid called mucus. Mucus drains out of your sinuses. Swelling can trap mucus in your sinuses. This lets germs (bacteria, virus, or fungus) grow, which leads to infection. Most of the time, this condition is caused by a virus. What are the causes? Allergies. Asthma. Germs. Things that block your nose or sinuses. Growths in the nose (nasal polyps). Chemicals or irritants in the air. A fungus. This is rare. What increases the risk? Having a weak body defense system (immune system). Doing a lot of swimming or diving. Using nasal sprays too much. Smoking. What are the signs or symptoms? The main symptoms of this condition are pain and a feeling of pressure around the sinuses. Other symptoms include: Stuffy nose (congestion). This may make it hard to breathe through your nose. Runny nose (drainage). Soreness, swelling, and warmth in the sinuses. A cough that may get worse at night. Being unable to smell and taste. Mucus that collects in the throat or the back of the nose (postnasal drip). This may cause a sore throat or bad breath. Being very tired (fatigued). A fever. How is this diagnosed? Your symptoms. Your medical history. A physical exam. Tests  to find out if your condition is short-term (acute) or long-term (chronic). Your doctor may: Check your nose for growths (polyps). Check your sinuses using a tool that has a light on one end (endoscope). Check for allergies or germs. Do imaging tests, such as an MRI or CT scan. How is this treated? Treatment for this condition depends on the cause and whether it is short-term or long-term. If caused by a virus, your symptoms should go away on their own within 10 days. You may be given medicines to relieve symptoms. They include: Medicines that shrink swollen tissue in the nose. A spray that treats swelling of the nostrils. Rinses that help get rid of thick mucus in your nose (nasal saline washes). Medicines that treat allergies (antihistamines). Over-the-counter pain relievers. If caused by bacteria, your doctor may wait to see if you will get better without treatment. You may be given antibiotic medicine if you have: A very bad infection. A weak body defense system. If caused by growths in the nose, surgery may be needed. Follow these instructions at home: Medicines Take, use, or apply over-the-counter and prescription medicines only as told by your doctor. These may include nasal sprays. If you were prescribed an antibiotic medicine, take it as told by your doctor. Do not stop taking it even if you start to feel better. Hydrate and humidify  Drink enough water to keep your pee (urine) pale yellow. Use a cool mist humidifier to keep the humidity level in your home above 50%. Breathe in steam for 10-15 minutes, 3-4 times a day, or as told by your doctor. You can do this in the bathroom while a hot shower is running. Try not to spend time in cool or dry air. Rest Rest as much as you can. Sleep with your head raised (elevated). Make sure you get enough sleep each night. General instructions  Put a warm, moist washcloth on your face 3-4 times a day, or as often as told by your  doctor. Use nasal saline washes as often as told by your doctor. Wash your hands often with soap and water. If you cannot use soap and water, use hand sanitizer. Do not smoke. Avoid being around people who are smoking (secondhand smoke). Keep all follow-up visits. Contact a doctor if: You have a fever. Your symptoms get worse. Your symptoms do not get better within 10 days. Get help right away if: You have a very bad headache. You cannot stop vomiting. You have very bad pain or swelling around your face or eyes. You have trouble seeing. You feel confused. Your neck is stiff. You have trouble breathing. These symptoms may be an emergency. Get help right away. Call 911. Do not wait to see if the symptoms will go away. Do not drive yourself to the hospital. Summary A sinus infection is swelling of your sinuses. Sinuses are hollow spaces in the bones around your face. This condition is caused by tissues in your nose that become inflamed or swollen. This traps germs. These can lead to infection. If you were prescribed an antibiotic medicine, take it as told by your doctor. Do not stop taking it even if you start to feel better. Keep all follow-up visits. This information is not intended to replace advice given to you by your health care provider. Make sure you discuss any questions you have with your health care provider. Document Revised: 12/15/2020 Document Reviewed: 12/15/2020 Elsevier Patient Education  2024 Elsevier Inc.    Edwina Barth, MD [facility]

## 2022-10-26 NOTE — Assessment & Plan Note (Signed)
Clinically stable.  No red flag signs or symptoms. Possible secondary bacterial sinus infection Recommend to start Augmentin 875 mg twice a day for 7 days Advised to contact the office if no better or worse during the next several days.

## 2022-10-31 DIAGNOSIS — I10 Essential (primary) hypertension: Secondary | ICD-10-CM | POA: Diagnosis not present

## 2022-10-31 DIAGNOSIS — G4733 Obstructive sleep apnea (adult) (pediatric): Secondary | ICD-10-CM | POA: Diagnosis not present

## 2022-11-07 ENCOUNTER — Inpatient Hospital Stay: Payer: HMO | Admitting: General Practice

## 2022-11-08 ENCOUNTER — Other Ambulatory Visit: Payer: Self-pay | Admitting: Emergency Medicine

## 2022-11-08 ENCOUNTER — Telehealth: Payer: Self-pay | Admitting: Emergency Medicine

## 2022-11-08 MED ORDER — AMOXICILLIN-POT CLAVULANATE 875-125 MG PO TABS: 1.0000 | ORAL_TABLET | Freq: Two times a day (BID) | ORAL | 0 refills | Status: AC

## 2022-11-08 NOTE — Telephone Encounter (Signed)
Called patient and informed her of provider recommendation

## 2022-11-08 NOTE — Telephone Encounter (Signed)
Start a second round of antibiotics.  New prescription for Augmentin sent to pharmacy of record today.

## 2022-11-08 NOTE — Telephone Encounter (Signed)
Patient was seen on 10/26/22 for a sinus infection. She was prescribed medication to help. She said it helped while she was on it, but now that it is out of her system, she is having the same symptoms again. Patient would like to know what she should do. Best callback is [phone].

## 2022-11-16 ENCOUNTER — Ambulatory Visit (INDEPENDENT_AMBULATORY_CARE_PROVIDER_SITE_OTHER): Payer: HMO | Admitting: Emergency Medicine

## 2022-11-16 ENCOUNTER — Encounter: Payer: Self-pay | Admitting: Emergency Medicine

## 2022-11-16 ENCOUNTER — Ambulatory Visit (INDEPENDENT_AMBULATORY_CARE_PROVIDER_SITE_OTHER): Payer: HMO

## 2022-11-16 VITALS — BP 130/70 | HR 74 | Temp 97.9°F | Ht 61.0 in | Wt 214.0 lb

## 2022-11-16 DIAGNOSIS — I776 Arteritis, unspecified: Secondary | ICD-10-CM

## 2022-11-16 DIAGNOSIS — B349 Viral infection, unspecified: Secondary | ICD-10-CM | POA: Diagnosis not present

## 2022-11-16 DIAGNOSIS — M542 Cervicalgia: Secondary | ICD-10-CM | POA: Diagnosis not present

## 2022-11-16 DIAGNOSIS — I10 Essential (primary) hypertension: Secondary | ICD-10-CM | POA: Diagnosis not present

## 2022-11-16 DIAGNOSIS — R599 Enlarged lymph nodes, unspecified: Secondary | ICD-10-CM | POA: Diagnosis not present

## 2022-11-16 LAB — CBC WITH DIFFERENTIAL/PLATELET
Basophils Absolute: 0.1 10*3/uL (ref 0.0–0.1)
Basophils Relative: 0.8 % (ref 0.0–3.0)
Eosinophils Absolute: 0.1 10*3/uL (ref 0.0–0.7)
Eosinophils Relative: 1.4 % (ref 0.0–5.0)
HCT: 39.1 % (ref 36.0–46.0)
Hemoglobin: 12.8 g/dL (ref 12.0–15.0)
Lymphocytes Relative: 32.9 % (ref 12.0–46.0)
Lymphs Abs: 2.6 10*3/uL (ref 0.7–4.0)
MCHC: 32.9 g/dL (ref 30.0–36.0)
MCV: 97.4 fL (ref 78.0–100.0)
Monocytes Absolute: 0.4 10*3/uL (ref 0.1–1.0)
Monocytes Relative: 4.5 % (ref 3.0–12.0)
Neutro Abs: 4.9 10*3/uL (ref 1.4–7.7)
Neutrophils Relative %: 60.4 % (ref 43.0–77.0)
Platelets: 221 10*3/uL (ref 150.0–400.0)
RBC: 4.01 Mil/uL (ref 3.87–5.11)
RDW: 14.4 % (ref 11.5–15.5)
WBC: 8.1 10*3/uL (ref 4.0–10.5)

## 2022-11-16 LAB — SEDIMENTATION RATE: Sed Rate: 72 mm/h — ABNORMAL HIGH (ref 0–30)

## 2022-11-16 LAB — COMPREHENSIVE METABOLIC PANEL
ALT: 10 U/L (ref 0–35)
AST: 15 U/L (ref 0–37)
Albumin: 4.4 g/dL (ref 3.5–5.2)
Alkaline Phosphatase: 68 U/L (ref 39–117)
BUN: 13 mg/dL (ref 6–23)
CO2: 31 meq/L (ref 19–32)
Calcium: 10.2 mg/dL (ref 8.4–10.5)
Chloride: 100 meq/L (ref 96–112)
Creatinine, Ser: 1.08 mg/dL (ref 0.40–1.20)
GFR: 48.63 mL/min — ABNORMAL LOW (ref >60.00)
Glucose, Bld: 103 mg/dL — ABNORMAL HIGH (ref 70–99)
Potassium: 4 meq/L (ref 3.5–5.1)
Sodium: 135 meq/L (ref 135–145)
Total Bilirubin: 0.4 mg/dL (ref 0.2–1.2)
Total Protein: 8.7 g/dL — ABNORMAL HIGH (ref 6.0–8.3)

## 2022-11-16 MED ORDER — TRAMADOL HCL 50 MG PO TABS
50.0000 mg | ORAL_TABLET | Freq: Three times a day (TID) | ORAL | 0 refills | Status: AC | PRN
Start: 2022-11-16 — End: 2022-11-21

## 2022-11-16 NOTE — Assessment & Plan Note (Signed)
Clinically stable.  No red flag signs or symptoms. Finished 2-week course of Augmentin without any significant difference. Tender right side of neck on examination.  Stable vital signs.  No signs of stroke Differential diagnosis discussed with patient Recommend blood work and chest x-ray today Most likely viral illness running its course but vasculitis a possibility.  No complications. Advised to continue monitoring symptoms and contact the office if clinical picture changes or gets worse over the next several days.

## 2022-11-16 NOTE — Progress Notes (Signed)
Diane Daugherty 82 y.o.   Chief Complaint  Patient presents with   Sinus Problem    Noted worsening sinus issues. PT had developed pain/knot in the back of the ride side of her neck yesterday. Notes throat seemed to be closed up a bit and notes of soreness. PT attempted treating it with tylenol to no avail    HISTORY OF PRESENT ILLNESS: This is a 82 y.o. female seen by me on 10/26/2022 and diagnosed with sinus infection.  Started on Augmentin.  Given 2 courses for 14 days total Still complaining of occasional headache, sore throat, and right-sided neck pain. No other new symptomatology.  Occasional headache, no visual disturbances, or any neurological symptoms. No other complaints or medical concerns today.     Prior to Admission medications   Medication Sig Start Date End Date Taking? Authorizing Provider  acetaminophen (TYLENOL) 650 MG CR tablet Take 650-1,300 mg by mouth every 8 (eight) hours as needed for pain.   Yes [provider]  amLODipine (NORVASC) 5 MG tablet Take 1 tablet (5 mg total) by mouth daily. 02/28/22 02/23/23 Yes Ziv Welchel, Eilleen Kempf, MD  baclofen (LIORESAL) 10 MG tablet Take 1 tablet (10 mg total) by mouth 3 (three) times daily as needed for muscle spasms. 10/11/21  Yes Kathryne Hitch, MD  Calcium Citrate-Vitamin D (CALCIUM CITRATE + D PO) Take 1 tablet by mouth daily.   Yes [provider]  CHELATED IRON PO Take 27 mg by mouth daily.   Yes [provider]  fluticasone (FLONASE) 50 MCG/ACT nasal spray Place 2 sprays into both nostrils daily as needed for allergies or rhinitis. 05/27/21  Yes Anaissa Macfadden, Eilleen Kempf, MD  Krill Oil 300 MG CAPS Take 300 mg by mouth daily.   Yes [provider]  Multiple Vitamins-Minerals (CENTRUM SILVER 50+WOMEN PO) Take 1 tablet by mouth daily.   Yes [provider]  valsartan (DIOVAN) 320 MG tablet Take 320 mg by mouth daily.   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   Stage 3a chronic kidney disease (HCC) 05/27/2021   History of multiple myeloma 05/27/2021   Primary osteoarthritis of left knee 10/07/2014   History of left knee replacement 10/07/2014   Asthma, chronic 08/30/2012   Essential hypertension 08/30/2012   Osteoarthritis of right knee 08/30/2012   OSA on CPAP 08/30/2012   Severe obesity (BMI >= 40) (HCC) 08/30/2012    Past Medical History:  Diagnosis Date   Anemia 09/24/1968   "after childbirth"   Arthritis    "feels like all over"   Carpal tunnel syndrome    Carpal tunnel syndrome    left   Cataract    immature on right   Chronic bronchitis (HCC)    "get it most years" (10/08/2014)   Chronic kidney disease    stage III, per Pt   Family history of anesthesia complication    daughter gets sick   History of blood transfusion    "w/one  of my back OR's"   Hypertension    takes Hyzaar daily   Joint pain    "all over"   Joint swelling    Muscle spasms of lower extremity    takes Baclofen daily prn   Nocturia    OSA on CPAP    Peripheral vascular disease (HCC)    PFO (patent foramen ovale)    positive saline bubble study 05/12/09 ([facility])   Pneumonia 2010/ 2011   PONV (postoperative nausea and vomiting)    woke up during hysterectomy   Shortness of breath    with exertion;Albuterol prn   Urinary frequency     Past Surgical History:  Procedure Laterality Date   ABDOMINAL HYSTERECTOMY  1970's?   APPENDECTOMY     BACK SURGERY      BREAST BIOPSY Right 1990's   CESAREAN SECTION  1962; 1963; 1970   CHEST TUBE INSERTION  80's   d/t pneumothorax   COLONOSCOPY     FRACTURE SURGERY     HARDWARE REMOVAL Right 1990's   "took screw out of my lower leg ~ 1 yr after putting it in"   HEMILAMINOTOMY LUMBAR SPINE  02/2002   L5; decompression of the L5 and S1 nerve root; synovial cyst excision/notes 06/09/2010   KNEE ARTHROSCOPY Right ?2013   LUMBAR LAMINECTOMY/DECOMPRESSION MICRODISCECTOMY  02/2010   POSTERIOR LUMBAR FUSION  05/2003   L4-5 and S-1    TIBIA FRACTURE SURGERY Right 1990's   "put screw in"   TOTAL KNEE ARTHROPLASTY Right 08/28/2012   Procedure: TOTAL KNEE ARTHROPLASTY;  Surgeon: Valeria Batman, MD;  Location: [facility] OR;  Service: Orthopedics;  Laterality: Right;   TOTAL KNEE ARTHROPLASTY Left 10/07/2014   TOTAL KNEE ARTHROPLASTY Left 10/07/2014   Procedure: TOTAL KNEE ARTHROPLASTY;  Surgeon: Valeria Batman, MD;  Location: [facility] OR;  Service: Orthopedics;  Laterality: Left;   TOTAL KNEE REVISION Left 10/08/2021   Procedure: LEFT TOTAL KNEE REVISION;  Surgeon: Kathryne Hitch, MD;  Location: [facility] ORS;  Service: Orthopedics;  Laterality: Left;    Social History   Socioeconomic History   Marital status: Divorced    Spouse name: Not on file   Number of children: Not on file   Years of education: Not on file   Highest education level: 12th grade  Occupational History   Not on file  Tobacco Use   Smoking status: Never   Smokeless tobacco: Never  Vaping Use   Vaping status: Never Used  Substance and Sexual Activity   Alcohol use: No   Drug use: No   Sexual activity: Not Currently  Other Topics Concern   Not on file  Social History Narrative   Not on file   Social Determinants of Health   Financial Resource Strain: Low Risk  (11/14/2022)   Overall Financial Resource Strain (CARDIA)    Difficulty of Paying Living Expenses: Not hard at all  Food Insecurity: No Food Insecurity (11/14/2022)   Hunger  Vital Sign    Worried About Running Out of Food in the Last Year: Never true    Ran Out of Food in the Last Year: Never true  Transportation Needs: No Transportation Needs (11/14/2022)   PRAPARE - Administrator, Civil Service (Medical): No    Lack of Transportation (Non-Medical): No  Physical Activity: Unknown (11/14/2022)   Exercise Vital Sign    Days of Exercise per Week: 3 days    Minutes of Exercise per Session: Patient declined  Recent Concern: Physical  Activity - Insufficiently Active (10/22/2022)   Exercise Vital Sign    Days of Exercise per Week: 4 days    Minutes of Exercise per Session: 10 min  Stress: Stress Concern Present (11/14/2022)   [employer] of Occupational Health - Occupational Stress Questionnaire    Feeling of Stress : Very much  Social Connections: Moderately Integrated (11/14/2022)   Social Connection and Isolation Panel [NHANES]    Frequency of Communication with Friends and Family: Three times a week    Frequency of Social Gatherings with Friends and Family: Twice a week    Attends Religious Services: More than 4 times per year    Active Member of [employer] or Organizations: Yes    Attends Banker Meetings: 1 to 4 times per year    Marital Status: Divorced  Intimate Partner Violence: Not At Risk (04/12/2022)   Humiliation, Afraid, Rape, and Kick questionnaire    Fear of Current or Ex-Partner: No    Emotionally Abused: No    Physically Abused: No    Sexually Abused: No    History reviewed. No pertinent family history.   Review of Systems  Constitutional:  Positive for fever.  HENT:  Positive for sore throat.   Respiratory: Negative.  Negative for cough and shortness of breath.   Cardiovascular: Negative.  Negative for chest pain and palpitations.  Gastrointestinal:  Negative for abdominal pain, diarrhea, nausea and vomiting.  Genitourinary: Negative.  Negative for dysuria and hematuria.  Skin: Negative.  Negative for rash.   Neurological:  Positive for headaches.  All other systems reviewed and are negative.   Vitals:   11/16/22 1521  BP: (!) 160/82  Pulse: 74  Temp: 97.9 F (36.6 C)  SpO2: 98%    Physical Exam Vitals reviewed.  Constitutional:      Appearance: Normal appearance.  HENT:     Head: Normocephalic.     Right Ear: Tympanic membrane, ear canal and external ear normal.     Left Ear: Tympanic membrane, ear canal and external ear normal.     Mouth/Throat:     Pharynx: Posterior oropharyngeal erythema present. No oropharyngeal exudate.  Eyes:     Extraocular Movements: Extraocular movements intact.     Pupils: Pupils are equal, round, and reactive to light.  Cardiovascular:     Rate and Rhythm: Normal rate and regular rhythm.     Pulses: Normal pulses.     Heart sounds: Normal heart sounds.  Pulmonary:     Effort: Pulmonary effort is normal.     Breath sounds: Normal breath sounds.  Lymphadenopathy:     Cervical: Cervical adenopathy (Right-sided) present.  Skin:    General: Skin is warm and dry.     Capillary Refill: Capillary refill takes less than 2 seconds.  Neurological:     General: No focal deficit present.     Mental Status: She is alert and oriented to person, place, and time.  Psychiatric:        Mood and Affect: Mood normal.        Behavior: Behavior normal.    Results for orders placed or performed in visit on 11/16/22 (from the past 24 hour(s))  CBC with Differential/Platelet     Status: None   Collection Time: 11/16/22  4:13 PM  Result Value Ref Range   WBC 8.1 4.0 - 10.5 K/uL   RBC 4.01 3.87 - 5.11 Mil/uL   Hemoglobin 12.8 12.0 - 15.0 g/dL   HCT 16.1 09.6 - 04.5 %  MCV 97.4 78.0 - 100.0 fl   MCHC 32.9 30.0 - 36.0 g/dL   RDW 01.0 93.2 - 35.5 %   Platelets 221.0 150.0 - 400.0 K/uL   Neutrophils Relative % 60.4 43.0 - 77.0 %   Lymphocytes Relative 32.9 12.0 - 46.0 %   Monocytes Relative 4.5 3.0 - 12.0 %   Eosinophils Relative 1.4 0.0 - 5.0 %   Basophils  Relative 0.8 0.0 - 3.0 %   Neutro Abs 4.9 1.4 - 7.7 K/uL   Lymphs Abs 2.6 0.7 - 4.0 K/uL   Monocytes Absolute 0.4 0.1 - 1.0 K/uL   Eosinophils Absolute 0.1 0.0 - 0.7 K/uL   Basophils Absolute 0.1 0.0 - 0.1 K/uL  Comprehensive metabolic panel     Status: Abnormal   Collection Time: 11/16/22  4:13 PM  Result Value Ref Range   Sodium 135 135 - 145 mEq/L   Potassium 4.0 3.5 - 5.1 mEq/L   Chloride 100 96 - 112 mEq/L   CO2 31 19 - 32 mEq/L   Glucose, Bld 103 (H) 70 - 99 mg/dL   BUN 13 6 - 23 mg/dL   Creatinine, Ser 7.32 0.40 - 1.20 mg/dL   Total Bilirubin 0.4 0.2 - 1.2 mg/dL   Alkaline Phosphatase 68 39 - 117 U/L   AST 15 0 - 37 U/L   ALT 10 0 - 35 U/L   Total Protein 8.7 (H) 6.0 - 8.3 g/dL   Albumin 4.4 3.5 - 5.2 g/dL   GFR 20.25 (L) >42.70 mL/min   Calcium 10.2 8.4 - 10.5 mg/dL  Sedimentation rate     Status: Abnormal   Collection Time: 11/16/22  4:13 PM  Result Value Ref Range   Sed Rate 72 (H) 0 - 30 mm/hr     ASSESSMENT & PLAN: A total of 45 minutes was spent with the patient and counseling/coordination of care regarding preparing for this visit, review of most recent office visit notes, review of chronic medical conditions under management, review of all medications, differential diagnosis including possibility of vasculitis, prognosis, ED precautions, documentation and need for follow-up if no better or worse during the next several days  Problem List Items Addressed This Visit       Cardiovascular and Mediastinum   Essential hypertension    Elevated blood pressure reading in the office but normal at home Continue amlodipine 5 mg daily and valsartan 320 mg daily      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  Recommend to start prednisone 60 mg daily today. Will need diagnostic imaging and vascular evaluation. Recommend to start with ultrasound but will also need CTA or MRA of  neck Referral to vascular surgery placed today ED precautions given.        Other   Viral illness    Clinically stable.  No red flag signs or symptoms. Finished 2-week course of Augmentin without any significant difference. Tender right side of neck on examination.  Stable vital signs.  No signs of stroke Differential diagnosis discussed with patient Recommend blood work and chest x-ray today Most likely viral illness running its course but vasculitis a possibility.  No complications. Advised to continue monitoring symptoms and contact the office if clinical picture changes or gets worse over the next several days.      Relevant Medications   traMADol (ULTRAM) 50 MG tablet   Other Relevant Orders   CBC with  Differential/Platelet (Completed)   Comprehensive metabolic panel (Completed)   Sedimentation rate (Completed)   DG Chest 2 View (Completed)   Neck pain - Primary    Right-sided neck pain.  Tender right side of the neck on examination. Seen on 10/26/2022 with a possible diagnosis of sinusitis.  2 weeks of antibiotics.  No improvement. Fever and chills at home.  Persistent right sided neck pain. Elevated sedimentation rate.  Suspected vasculitic process, possibly right carotid artery. Recommend to start prednisone 60 mg daily.  Needs neck imaging.  Recommend to start with ultrasound but will also need CTA or MRA of neck.  Recommend vascular evaluation.  Referral placed today. Patient clinically stable.  No signs of stroke. ED precautions given.      Patient Instructions  Vasculitis  Vasculitis is inflammation of the blood vessels. It can cause the blood vessels to become thick, narrow, scarred, or weak. It can also reduce blood flow. This can cause damage to the muscles, kidneys, lungs, brain, and other parts of the body. There are many types of vasculitis. Different types may affect different kinds of blood vessels or parts of the body. Some types last only a short time.  Others last a long time. What are the causes? The exact cause of vasculitis is not known. In some cases, it can happen when the body's defense system (immune system) starts to attack its own blood vessels. This attack can be caused by: An infection. An immune system disease, such as lupus, rheumatoid arthritis, or scleroderma. An allergic reaction to a medicine. A cancer that affects blood cells, such as leukemia or lymphoma. What increases the risk? You may be more likely to get this condition if: You smoke. You are under stress. You have a physical injury. What are the signs or symptoms? Symptoms depend on the type of vasculitis that you have. Common symptoms include: Fever. Poor appetite. Weight loss. Feeling very tired (fatigue) or weak. Having aches and pains. Numbness in an area of your body. Certain types of vasculitis may result in: Skin problems, such as sores, spots, or rashes. Trouble seeing. Trouble breathing. Coughing up blood. Blood in your pee (urine). Headaches. Stomach pain. A stuffy or bloody nose. How is this diagnosed? This condition may be diagnosed based on your symptoms and a physical exam. You may also have tests. These may include: Blood tests. A pee test. A biopsy of a blood vessel. This is when a small part of the blood vessel is removed for testing. A nerve conduction study. This is a test of the electrical signals that move through nerves. Imaging tests, such as: X-ray. CT scan. Ultrasound. MRI. Angiogram. How is this treated? In some cases, treatment is not needed. Treatment will be based on the type of vasculitis you have. If you have a mild case, common pain relievers might help. For serious cases, treatment may include: Medicines that lower inflammation in your body. Medicines that reduce the activity of the immune system (immunosuppressants). You will need to see your health care provider while you are being treated. During follow-up  visits, your provider may: Do blood tests and bone density tests. Check your blood pressure and blood sugar. Check for side effects of any medicines you are taking. Vasculitis cannot always be cured. In some cases, symptoms go away but the disease does not. If symptoms come back, more treatment may be needed. Follow these instructions at home: Take over-the-counter and prescription medicines only as told by your provider. Follow a healthy  diet. Eat fruits, vegetables, whole grains, and healthy sources of protein. Rest as told by your provider. Talk with your provider about what exercises are safe for you. Learn as much as you can about vasculitis. Think about joining a support group. You may need to: Talk to others who have vasculitis. This may help you manage your condition. Talk with your provider if you feel stressed, worried, or depressed. Where to find more information Vasculitis Foundation: vasculitisfoundation.org Contact a health care provider if: Your symptoms come back. You have new symptoms. Your fever, fatigue, headache, or weight loss gets worse. You have signs of infection. These may include redness, swelling, tenderness, warmth, or a new fever. Your pain does not go away, even after you take pain medicine. Your nose bleeds. Get help right away if: Your vision gets worse. You have chest pain or stomach pain. You have trouble breathing. One side of your face or body becomes weak or numb all of a sudden. There is blood in your pee. These symptoms may be an emergency. Get help right away. Call 911. Do not wait to see if the symptoms will go away. Do not drive yourself to the hospital. This information is not intended to replace advice given to you by your health care provider. Make sure you discuss any questions you have with your health care provider. Document Revised: 01/25/2022 Document Reviewed: 01/25/2022 Elsevier Patient Education  2024 Elsevier  Inc.      Edwina Barth, MD [facility]

## 2022-11-16 NOTE — Patient Instructions (Signed)
Vasculitis  Vasculitis is inflammation of the blood vessels. It can cause the blood vessels to become thick, narrow, scarred, or weak. It can also reduce blood flow. This can cause damage to the muscles, kidneys, lungs, brain, and other parts of the body. There are many types of vasculitis. Different types may affect different kinds of blood vessels or parts of the body. Some types last only a short time. Others last a long time. What are the causes? The exact cause of vasculitis is not known. In some cases, it can happen when the body's defense system (immune system) starts to attack its own blood vessels. This attack can be caused by: An infection. An immune system disease, such as lupus, rheumatoid arthritis, or scleroderma. An allergic reaction to a medicine. A cancer that affects blood cells, such as leukemia or lymphoma. What increases the risk? You may be more likely to get this condition if: You smoke. You are under stress. You have a physical injury. What are the signs or symptoms? Symptoms depend on the type of vasculitis that you have. Common symptoms include: Fever. Poor appetite. Weight loss. Feeling very tired (fatigue) or weak. Having aches and pains. Numbness in an area of your body. Certain types of vasculitis may result in: Skin problems, such as sores, spots, or rashes. Trouble seeing. Trouble breathing. Coughing up blood. Blood in your pee (urine). Headaches. Stomach pain. A stuffy or bloody nose. How is this diagnosed? This condition may be diagnosed based on your symptoms and a physical exam. You may also have tests. These may include: Blood tests. A pee test. A biopsy of a blood vessel. This is when a small part of the blood vessel is removed for testing. A nerve conduction study. This is a test of the electrical signals that move through nerves. Imaging tests, such as: X-ray. CT scan. Ultrasound. MRI. Angiogram. How is this treated? In some cases,  treatment is not needed. Treatment will be based on the type of vasculitis you have. If you have a mild case, common pain relievers might help. For serious cases, treatment may include: Medicines that lower inflammation in your body. Medicines that reduce the activity of the immune system (immunosuppressants). You will need to see your health care provider while you are being treated. During follow-up visits, your provider may: Do blood tests and bone density tests. Check your blood pressure and blood sugar. Check for side effects of any medicines you are taking. Vasculitis cannot always be cured. In some cases, symptoms go away but the disease does not. If symptoms come back, more treatment may be needed. Follow these instructions at home: Take over-the-counter and prescription medicines only as told by your provider. Follow a healthy diet. Eat fruits, vegetables, whole grains, and healthy sources of protein. Rest as told by your provider. Talk with your provider about what exercises are safe for you. Learn as much as you can about vasculitis. Think about joining a support group. You may need to: Talk to others who have vasculitis. This may help you manage your condition. Talk with your provider if you feel stressed, worried, or depressed. Where to find more information Vasculitis Foundation: vasculitisfoundation.org Contact a health care provider if: Your symptoms come back. You have new symptoms. Your fever, fatigue, headache, or weight loss gets worse. You have signs of infection. These may include redness, swelling, tenderness, warmth, or a new fever. Your pain does not go away, even after you take pain medicine. Your nose bleeds. Get help  right away if: Your vision gets worse. You have chest pain or stomach pain. You have trouble breathing. One side of your face or body becomes weak or numb all of a sudden. There is blood in your pee. These symptoms may be an emergency. Get help  right away. Call 911. Do not wait to see if the symptoms will go away. Do not drive yourself to the hospital. This information is not intended to replace advice given to you by your health care provider. Make sure you discuss any questions you have with your health care provider. Document Revised: 01/25/2022 Document Reviewed: 01/25/2022 Elsevier Patient Education  2024 [employer].

## 2022-11-16 NOTE — Assessment & Plan Note (Signed)
Elevated blood pressure reading in the office but normal at home Continue amlodipine 5 mg daily and valsartan 320 mg daily

## 2022-11-17 ENCOUNTER — Ambulatory Visit (HOSPITAL_COMMUNITY)
Admission: RE | Admit: 2022-11-17 | Discharge: 2022-11-17 | Disposition: A | Discharge status: Home / Self Care | Payer: HMO | Source: Ambulatory Visit | Attending: Emergency Medicine | Admitting: Emergency Medicine

## 2022-11-17 ENCOUNTER — Other Ambulatory Visit (INDEPENDENT_AMBULATORY_CARE_PROVIDER_SITE_OTHER): Payer: HMO | Admitting: Emergency Medicine

## 2022-11-17 ENCOUNTER — Other Ambulatory Visit: Payer: Self-pay | Admitting: *Deleted

## 2022-11-17 DIAGNOSIS — I776 Arteritis, unspecified: Secondary | ICD-10-CM | POA: Insufficient documentation

## 2022-11-17 DIAGNOSIS — M542 Cervicalgia: Secondary | ICD-10-CM | POA: Insufficient documentation

## 2022-11-17 MED ORDER — PREDNISONE 20 MG PO TABS
60.0000 mg | ORAL_TABLET | Freq: Every day | ORAL | 0 refills | Status: AC
Start: 2022-11-17 — End: 2022-11-24

## 2022-11-17 NOTE — Assessment & Plan Note (Signed)
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  Recommend to start prednisone 60 mg daily today. Will need diagnostic imaging and vascular evaluation. Recommend to start with ultrasound but will also need CTA or MRA of neck Referral to vascular surgery placed today ED precautions given.

## 2022-11-17 NOTE — Assessment & Plan Note (Signed)
Right-sided neck pain.  Tender right side of the neck on examination. Seen on 10/26/2022 with a possible diagnosis of sinusitis.  2 weeks of antibiotics.  No improvement. Fever and chills at home.  Persistent right sided neck pain. Elevated sedimentation rate.  Suspected vasculitic process, possibly right carotid artery. Recommend to start prednisone 60 mg daily.  Needs neck imaging.  Recommend to start with ultrasound but will also need CTA or MRA of neck.  Recommend vascular evaluation.  Referral placed today. Patient clinically stable.  No signs of stroke. ED precautions given.

## 2022-11-25 ENCOUNTER — Encounter: Payer: Self-pay | Admitting: Vascular Surgery

## 2022-11-25 ENCOUNTER — Ambulatory Visit (INDEPENDENT_AMBULATORY_CARE_PROVIDER_SITE_OTHER): Payer: HMO | Admitting: Vascular Surgery

## 2022-11-25 VITALS — BP 151/81 | HR 70 | Temp 98.3°F | Resp 20 | Ht 61.0 in | Wt 214.0 lb

## 2022-11-25 DIAGNOSIS — I6522 Occlusion and stenosis of left carotid artery: Secondary | ICD-10-CM

## 2022-11-25 DIAGNOSIS — I10 Essential (primary) hypertension: Secondary | ICD-10-CM

## 2022-11-25 DIAGNOSIS — G4733 Obstructive sleep apnea (adult) (pediatric): Secondary | ICD-10-CM

## 2022-12-02 ENCOUNTER — Ambulatory Visit
Admission: RE | Admit: 2022-12-02 | Discharge: 2022-12-02 | Disposition: A | Discharge status: Home / Self Care | Payer: HMO | Source: Ambulatory Visit | Attending: Emergency Medicine | Admitting: Emergency Medicine

## 2022-12-02 ENCOUNTER — Encounter: Payer: PRIVATE HEALTH INSURANCE | Admitting: Vascular Surgery

## 2022-12-02 DIAGNOSIS — I776 Arteritis, unspecified: Secondary | ICD-10-CM

## 2022-12-02 DIAGNOSIS — M542 Cervicalgia: Secondary | ICD-10-CM | POA: Diagnosis not present

## 2022-12-02 MED ORDER — GADOPICLENOL 0.5 MMOL/ML IV SOLN
10.0000 mL | Freq: Once | INTRAVENOUS | Status: AC | PRN
  Administered 2022-12-02: 10 mL via INTRAVENOUS

## 2022-12-05 ENCOUNTER — Inpatient Hospital Stay: Payer: PRIVATE HEALTH INSURANCE | Admitting: Licensed Clinical Social Worker

## 2022-12-14 DIAGNOSIS — H16223 Keratoconjunctivitis sicca, not specified as Sjogren's, bilateral: Secondary | ICD-10-CM | POA: Diagnosis not present

## 2022-12-15 ENCOUNTER — Other Ambulatory Visit: Payer: Self-pay

## 2022-12-15 DIAGNOSIS — I6522 Occlusion and stenosis of left carotid artery: Secondary | ICD-10-CM

## 2022-12-20 ENCOUNTER — Ambulatory Visit (INDEPENDENT_AMBULATORY_CARE_PROVIDER_SITE_OTHER): Payer: HMO | Admitting: Emergency Medicine

## 2022-12-20 ENCOUNTER — Encounter: Payer: Self-pay | Admitting: Emergency Medicine

## 2022-12-20 VITALS — BP 152/90 | HR 96 | Temp 98.3°F | Ht 61.0 in | Wt 214.2 lb

## 2022-12-20 DIAGNOSIS — I10 Essential (primary) hypertension: Secondary | ICD-10-CM | POA: Diagnosis not present

## 2022-12-20 DIAGNOSIS — R519 Headache, unspecified: Secondary | ICD-10-CM | POA: Insufficient documentation

## 2022-12-20 DIAGNOSIS — N1831 Chronic kidney disease, stage 3a: Secondary | ICD-10-CM

## 2022-12-20 MED ORDER — HYDROCODONE-ACETAMINOPHEN 5-325 MG PO TABS: 1.0000 | ORAL_TABLET | Freq: Four times a day (QID) | ORAL | 0 refills | Status: DC | PRN

## 2022-12-20 MED ORDER — TRAMADOL HCL 50 MG PO TABS: 50.0000 mg | ORAL_TABLET | Freq: Three times a day (TID) | ORAL | 0 refills | Status: DC | PRN

## 2022-12-20 NOTE — Assessment & Plan Note (Signed)
Stable chronic condition. Advised to stay well-hydrated and avoid NSAIDs

## 2022-12-20 NOTE — Assessment & Plan Note (Signed)
Elevated blood pressure reading in the office but normal at home Continue amlodipine 5 mg daily and valsartan 320 mg daily

## 2022-12-20 NOTE — Assessment & Plan Note (Signed)
Clinically stable.  No red flag signs or symptoms. Normal neurological exam. Recent visit with vascular surgeon reviewed.  Does not think patient has vasculitis. MRA of neck done on 12/02/2022.  Report still pending Radiology office contacted today. Recommend neurology evaluation.  Referral placed today. May take Norco for moderate to severe headaches as needed

## 2022-12-20 NOTE — Patient Instructions (Signed)
General Headache Without Cause A headache is pain or discomfort you feel around the head or neck area. There are many causes and types of headaches. In some cases, the cause may not be found. Follow these instructions at home: Watch your condition for any changes. Let your doctor know about them. Take these steps to help with your condition: Managing pain     Take over-the-counter and prescription medicines only as told by your doctor. This includes medicines for pain that are taken by mouth or put on the skin. Lie down in a dark, quiet room when you have a headache. If told, put ice on your head and neck area: Put ice in a plastic bag. Place a towel between your skin and the bag. Leave the ice on for 20 minutes, 2-3 times per day. Take off the ice if your skin turns bright red. This is very important. If you cannot feel pain, heat, or cold, you have a greater risk of damage to the area. If told, put heat on the affected area. Use the heat source that your doctor recommends, such as a moist heat pack or a heating pad. Place a towel between your skin and the heat source. Leave the heat on for 20-30 minutes. Take off the heat if your skin turns bright red. This is very important. If you cannot feel pain, heat, or cold, you have a greater risk of getting burned. Keep lights dim if bright lights bother you or make your headaches worse. Eating and drinking Eat meals on a regular schedule. If you drink alcohol: Limit how much you have to: 0-1 drink a day for women who are not pregnant. 0-2 drinks a day for men. Know how much alcohol is in a drink. In the [place] one drink equals one 12 oz bottle of beer (355 mL), one 5 oz glass of wine (148 mL), or one 1 oz glass of hard liquor (44 mL). Stop drinking caffeine, or drink less caffeine. General instructions  Keep a journal to find out if certain things bring on headaches. For example, write down: What you eat and drink. How much sleep you  get. Any change to your diet or medicines. Get a massage or try other ways to relax. Limit stress. Sit up straight. Do not tighten (tense) your muscles. Do not smoke or use any products that contain nicotine or tobacco. If you need help quitting, ask your doctor. Exercise regularly as told by your doctor. Get enough sleep. This often means 7-9 hours of sleep each night. Keep all follow-up visits. This is important. Contact a doctor if: Medicine does not help your symptoms. You have a headache that feels different than the other headaches. You feel like you may vomit (nauseous) or you vomit. You have a fever. Get help right away if: Your headache: Gets very bad quickly. Gets worse after a lot of physical activity. You have any of these symptoms: You continue to vomit. A stiff neck. Trouble seeing. Your eye or ear hurts. Trouble speaking. Weak muscles or you lose muscle control. You lose your balance or have trouble walking. You feel like you will pass out (faint) or you pass out. You are mixed up (confused). You have a seizure. These symptoms may be an emergency. Get help right away. Call your local emergency services (911 in the [place] Do not wait to see if the symptoms will go away. Do not drive yourself to the hospital. Summary A headache is pain or discomfort that  is felt around the head or neck area. There are many causes and types of headaches. In some cases, the cause may not be found. Keep a journal to help find out what causes your headaches. Watch your condition for any changes. Let your doctor know about them. Contact a doctor if you have a headache that is different from usual, or if medicine does not help your headache. Get help right away if your headache gets very bad, you throw up, you have trouble seeing, you lose your balance, or you have a seizure. This information is not intended to replace advice given to you by your health care provider. Make sure you  discuss any questions you have with your health care provider. Document Revised: 06/10/2020 Document Reviewed: 06/10/2020 Elsevier Patient Education  2024 [employer].

## 2022-12-20 NOTE — Progress Notes (Signed)
Diane Daugherty 82 y.o.   Chief Complaint  Patient presents with   Headache    Nausea and dizziness when she has headaches and head pains. Back of skull and the frontal temporal    Neck Pain    Patient states still having the constant pain has had visits before for this     HISTORY OF PRESENT ILLNESS: This is a 82 y.o. female complaining of persistent intermittent headaches for the past few weeks Seen by me on 11/16/2022 with neck pain.  Pain radiating to her head.  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 Started on prednisone and referred to vascular surgeon.  Prednisone helped.  Was evaluated by vascular surgeon with low suspicion for vasculitis MRA of neck was done on 12/02/2022.  Report not available yet. Denies visual symptoms.  No new symptoms.  Persistent intermittent ones.   Headache  Associated symptoms include neck pain. Pertinent negatives include no abdominal pain, coughing, fever, nausea, seizures, sore throat or vomiting.  Neck Pain  Associated symptoms include headaches. Pertinent negatives include no chest pain or fever.     Prior to Admission medications   Medication Sig Start Date End Date Taking? Authorizing Provider  acetaminophen (TYLENOL) 650 MG CR tablet Take 650-1,300 mg by mouth every 8 (eight) hours as needed for pain.   Yes [provider]  amLODipine (NORVASC) 5 MG tablet Take 1 tablet (5 mg total) by mouth daily. 02/28/22 02/23/23 Yes [place], Eilleen Kempf, MD  baclofen (LIORESAL) 10 MG tablet Take 1 tablet (10 mg total) by mouth 3 (three) times daily as needed for muscle spasms. 10/11/21  Yes Kathryne Hitch, MD  Calcium Citrate-Vitamin D (CALCIUM CITRATE + D PO) Take 1 tablet by mouth daily.   Yes [provider]  CHELATED IRON PO Take 27 mg by mouth daily.   Yes [provider]  fluticasone (FLONASE) 50 MCG/ACT nasal spray Place 2 sprays into both nostrils daily as needed for allergies or rhinitis. 05/27/21  Yes Genevra Orne,  Eilleen Kempf, MD  Krill Oil 300 MG CAPS Take 300 mg by mouth daily.   Yes [provider]  Multiple Vitamins-Minerals (CENTRUM SILVER 50+WOMEN PO) Take 1 tablet by mouth daily.   Yes [provider]  valsartan (DIOVAN) 320 MG tablet Take 320 mg by mouth daily.   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   Stage 3a chronic kidney disease (HCC) 05/27/2021   History of multiple myeloma 05/27/2021   Primary osteoarthritis of left knee 10/07/2014   History of left knee replacement 10/07/2014   Asthma, chronic 08/30/2012   Essential hypertension 08/30/2012   Osteoarthritis of right knee 08/30/2012   OSA on CPAP 08/30/2012   Severe obesity (BMI >= 40) (HCC) 08/30/2012    Past Medical History:  Diagnosis Date   Anemia 09/24/1968   "after childbirth"   Arthritis    "  feels like all over"   Carpal tunnel syndrome    Carpal tunnel syndrome    left   Cataract    immature on right   Chronic bronchitis (HCC)    "get it most years" (10/08/2014)   Chronic kidney disease    stage III, per Pt   Family history of anesthesia complication    daughter gets sick   History of blood transfusion    "w/one of my back OR's"   Hypertension    takes Hyzaar daily   Joint pain    "all over"   Joint swelling    Muscle spasms of lower extremity    takes Baclofen daily prn   Nocturia    OSA on CPAP     Peripheral vascular disease (HCC)    PFO (patent foramen ovale)    positive saline bubble study 05/12/09 ([facility])   Pneumonia 2010/ 2011   PONV (postoperative nausea and vomiting)    woke up during hysterectomy   Shortness of breath    with exertion;Albuterol prn   Urinary frequency     Past Surgical History:  Procedure Laterality Date   ABDOMINAL HYSTERECTOMY  1970's?   APPENDECTOMY     BACK SURGERY     BREAST BIOPSY Right 1990's   CESAREAN SECTION  1962; 1963; 1970   CHEST TUBE INSERTION  80's   d/t pneumothorax   COLONOSCOPY     FRACTURE SURGERY     HARDWARE REMOVAL Right 1990's   "took screw out of my lower leg ~ 1 yr after putting it in"   HEMILAMINOTOMY LUMBAR SPINE  02/2002   L5; decompression of the L5 and S1 nerve root; synovial cyst excision/notes 06/09/2010   KNEE ARTHROSCOPY Right ?2013   LUMBAR LAMINECTOMY/DECOMPRESSION MICRODISCECTOMY  02/2010   POSTERIOR LUMBAR FUSION  05/2003   L4-5 and S-1    TIBIA FRACTURE SURGERY Right 1990's   "put screw in"   TOTAL KNEE ARTHROPLASTY Right 08/28/2012   Procedure: TOTAL KNEE ARTHROPLASTY;  Surgeon: Valeria Batman, MD;  Location: [facility] OR;  Service: Orthopedics;  Laterality: Right;   TOTAL KNEE ARTHROPLASTY Left 10/07/2014   TOTAL KNEE ARTHROPLASTY Left 10/07/2014   Procedure: TOTAL KNEE ARTHROPLASTY;  Surgeon: Valeria Batman, MD;  Location: [facility] OR;  Service: Orthopedics;  Laterality: Left;   TOTAL KNEE REVISION Left 10/08/2021   Procedure: LEFT TOTAL KNEE REVISION;  Surgeon: Kathryne Hitch, MD;  Location: [facility] ORS;  Service: Orthopedics;  Laterality: Left;    Social History   Socioeconomic History   Marital status: Divorced    Spouse name: Not on file   Number of children: Not on file   Years of education: Not on file   Highest education level: 12th grade  Occupational History   Not on file  Tobacco Use   Smoking status: Never   Smokeless tobacco: Never  Vaping Use   Vaping status: Never Used  Substance and  Sexual Activity   Alcohol use: No   Drug use: No   Sexual activity: Not Currently  Other Topics Concern   Not on file  Social History Narrative   Not on file   Social Determinants of Health   Financial Resource Strain: Low Risk  (11/14/2022)   Overall Financial Resource Strain (CARDIA)    Difficulty of Paying Living Expenses: Not hard at all  Food Insecurity: No Food Insecurity (11/14/2022)   Hunger Vital Sign    Worried About Running Out of Food in the Last Year: Never  true    Ran Out of Food in the Last Year: Never true  Transportation Needs: No Transportation Needs (11/14/2022)   PRAPARE - Administrator, Civil Service (Medical): No    Lack of Transportation (Non-Medical): No  Physical Activity: Unknown (11/14/2022)   Exercise Vital Sign    Days of Exercise per Week: 3 days    Minutes of Exercise per Session: Patient declined  Recent Concern: Physical Activity - Insufficiently Active (10/22/2022)   Exercise Vital Sign    Days of Exercise per Week: 4 days    Minutes of Exercise per Session: 10 min  Stress: Stress Concern Present (11/14/2022)   [employer] of Occupational Health - Occupational Stress Questionnaire    Feeling of Stress : Very much  Social Connections: Moderately Integrated (11/14/2022)   Social Connection and Isolation Panel [NHANES]    Frequency of Communication with Friends and Family: Three times a week    Frequency of Social Gatherings with Friends and Family: Twice a week    Attends Religious Services: More than 4 times per year    Active Member of [employer] or Organizations: Yes    Attends Banker Meetings: 1 to 4 times per year    Marital Status: Divorced  Intimate Partner Violence: Not At Risk (04/12/2022)   Humiliation, Afraid, Rape, and Kick questionnaire    Fear of Current or Ex-Partner: No    Emotionally Abused: No    Physically Abused: No    Sexually Abused: No    No family history on file.   Review of Systems   Constitutional: Negative.  Negative for chills and fever.  HENT: Negative.  Negative for congestion and sore throat.   Respiratory: Negative.  Negative for cough and shortness of breath.   Cardiovascular: Negative.  Negative for chest pain and palpitations.  Gastrointestinal:  Negative for abdominal pain, diarrhea, nausea and vomiting.  Genitourinary: Negative.  Negative for dysuria and hematuria.  Musculoskeletal:  Positive for neck pain.  Skin: Negative.  Negative for rash.  Neurological:  Positive for headaches. Negative for sensory change, speech change, focal weakness, seizures and loss of consciousness.  All other systems reviewed and are negative.   Vitals:   12/20/22 1339  BP: (!) 152/90  Pulse: 96  Temp: 98.3 F (36.8 C)  SpO2: 98%    Physical Exam Vitals reviewed.  Constitutional:      Appearance: Normal appearance.  HENT:     Head: Normocephalic.     Mouth/Throat:     Mouth: Mucous membranes are moist.     Pharynx: Oropharynx is clear.  Eyes:     Extraocular Movements: Extraocular movements intact.     Conjunctiva/sclera: Conjunctivae normal.     Pupils: Pupils are equal, round, and reactive to light.  Neck:     Vascular: No carotid bruit.  Cardiovascular:     Rate and Rhythm: Normal rate and regular rhythm.     Pulses: Normal pulses.     Heart sounds: Normal heart sounds.  Pulmonary:     Effort: Pulmonary effort is normal.     Breath sounds: Normal breath sounds.  Musculoskeletal:     Cervical back: No tenderness.  Lymphadenopathy:     Cervical: No cervical adenopathy.  Skin:    General: Skin is warm and dry.  Neurological:     General: No focal deficit present.     Mental Status: She is alert and oriented to person, place, and time.     Cranial  Nerves: No cranial nerve deficit.     Sensory: No sensory deficit.     Motor: No weakness.     Coordination: Coordination normal.     Gait: Gait normal.  Psychiatric:        Mood and Affect: Mood normal.         Behavior: Behavior normal.      ASSESSMENT & PLAN: A total of 43 minutes was spent with the patient and counseling/coordination of care regarding preparing for this visit, review of most recent office visit notes, review of multiple chronic medical conditions and their management, review of all medications, review of most recent bloodwork results, review of health maintenance items, education on nutrition, prognosis, documentation, and need for follow up. .  Problem List Items Addressed This Visit       Cardiovascular and Mediastinum   Essential hypertension    Elevated blood pressure reading in the office but normal at home Continue amlodipine 5 mg daily and valsartan 320 mg daily        Genitourinary   Stage 3a chronic kidney disease (HCC)    Stable chronic condition. Advised to stay well-hydrated and avoid NSAIDs        Other   Persistent headaches - Primary    Clinically stable.  No red flag signs or symptoms. Normal neurological exam. Recent visit with vascular surgeon reviewed.  Does not think patient has vasculitis. MRA of neck done on 12/02/2022.  Report still pending Radiology office contacted today. Recommend neurology evaluation.  Referral placed today. May take Norco for moderate to severe headaches as needed      Relevant Medications   HYDROcodone-acetaminophen (NORCO) 5-325 MG tablet   Other Relevant Orders   Ambulatory referral to Neurology   Patient Instructions  General Headache Without Cause A headache is pain or discomfort you feel around the head or neck area. There are many causes and types of headaches. In some cases, the cause may not be found. Follow these instructions at home: Watch your condition for any changes. Let your doctor know about them. Take these steps to help with your condition: Managing pain     Take over-the-counter and prescription medicines only as told by your doctor. This includes medicines for pain that are taken  by mouth or put on the skin. Lie down in a dark, quiet room when you have a headache. If told, put ice on your head and neck area: Put ice in a plastic bag. Place a towel between your skin and the bag. Leave the ice on for 20 minutes, 2-3 times per day. Take off the ice if your skin turns bright red. This is very important. If you cannot feel pain, heat, or cold, you have a greater risk of damage to the area. If told, put heat on the affected area. Use the heat source that your doctor recommends, such as a moist heat pack or a heating pad. Place a towel between your skin and the heat source. Leave the heat on for 20-30 minutes. Take off the heat if your skin turns bright red. This is very important. If you cannot feel pain, heat, or cold, you have a greater risk of getting burned. Keep lights dim if bright lights bother you or make your headaches worse. Eating and drinking Eat meals on a regular schedule. If you drink alcohol: Limit how much you have to: 0-1 drink a day for women who are not pregnant. 0-2 drinks a day for men. Know how  much alcohol is in a drink. In the [place] one drink equals one 12 oz bottle of beer (355 mL), one 5 oz glass of wine (148 mL), or one 1 oz glass of hard liquor (44 mL). Stop drinking caffeine, or drink less caffeine. General instructions  Keep a journal to find out if certain things bring on headaches. For example, write down: What you eat and drink. How much sleep you get. Any change to your diet or medicines. Get a massage or try other ways to relax. Limit stress. Sit up straight. Do not tighten (tense) your muscles. Do not smoke or use any products that contain nicotine or tobacco. If you need help quitting, ask your doctor. Exercise regularly as told by your doctor. Get enough sleep. This often means 7-9 hours of sleep each night. Keep all follow-up visits. This is important. Contact a doctor if: Medicine does not help your symptoms. You have a  headache that feels different than the other headaches. You feel like you may vomit (nauseous) or you vomit. You have a fever. Get help right away if: Your headache: Gets very bad quickly. Gets worse after a lot of physical activity. You have any of these symptoms: You continue to vomit. A stiff neck. Trouble seeing. Your eye or ear hurts. Trouble speaking. Weak muscles or you lose muscle control. You lose your balance or have trouble walking. You feel like you will pass out (faint) or you pass out. You are mixed up (confused). You have a seizure. These symptoms may be an emergency. Get help right away. Call your local emergency services (911 in the [place] Do not wait to see if the symptoms will go away. Do not drive yourself to the hospital. Summary A headache is pain or discomfort that is felt around the head or neck area. There are many causes and types of headaches. In some cases, the cause may not be found. Keep a journal to help find out what causes your headaches. Watch your condition for any changes. Let your doctor know about them. Contact a doctor if you have a headache that is different from usual, or if medicine does not help your headache. Get help right away if your headache gets very bad, you throw up, you have trouble seeing, you lose your balance, or you have a seizure. This information is not intended to replace advice given to you by your health care provider. Make sure you discuss any questions you have with your health care provider. Document Revised: 06/10/2020 Document Reviewed: 06/10/2020 Elsevier Patient Education  2024 [employer] Inc[employer]     Edwina Barth, MD  Primary Care at [facility]

## 2022-12-26 ENCOUNTER — Telehealth: Payer: Self-pay | Admitting: Emergency Medicine

## 2022-12-26 NOTE — Progress Notes (Unsigned)
Initial neurology clinic note  Diane Daugherty MRN: [id number] DOB: Sep 26, 1940  Referring provider: [name], *  Primary care provider: [name], MD  Reason for consult:  headaches  Subjective:  This is Ms. Diane Daugherty, a 82 y.o. ***-handed female with a medical history of smoldering multiple myeloma, HTN, OSA (on CPAP?***), CKD, carpal tunnel syndrome*** who presents to neurology clinic with headaches. The patient is accompanied by ***.  *** Intermittent headaches since the beginning of 11/2022 Also has neck pain radiating to head Given Norco by PCP***  Followed by hematology/oncology for smoldering multiple myeloma. Per most recent clinic note on 08/17/22:  #Smoldering Multiple Myeloma -- The patient currently meets one of the three 20-2-20 criteria (bmbx with 20% plasma cells). Two criteria are required for treatment.  --labs from 08/11/2022 reviewed with patient. WBC 6.9,  Hgb 11.2, MCV 93.1, and platelets of 179. Myeloma labs show stable M protein measuring 1.6 g/dL. Kappa light chain elevated at 86.5, lambda light chain 9.0, ratio 9.61. -- No clear indication for treatment as long as the patient does not meet this criteria or any of the CRAB criteria --Continue on observation. We will order SPEP, UPEP, serum free light chains, LDH, CMP and CBC with each visit.  --Most recent bone scan from 02/18/2022 showed no definitive lytic bone lesion. Next scan due at next visit.  --Recommend follow-up visit in 6 months time   MEDICATIONS:  Outpatient Encounter Medications as of 12/29/2022  Medication Sig Note   amLODipine (NORVASC) 5 MG tablet Take 1 tablet (5 mg total) by mouth daily.    baclofen (LIORESAL) 10 MG tablet Take 1 tablet (10 mg total) by mouth 3 (three) times daily as needed for muscle spasms. 02/24/2022: Taking about 1 once every couple of months   Calcium Citrate-Vitamin D (CALCIUM CITRATE + D PO) Take 1 tablet by mouth daily.    CHELATED IRON PO  Take 27 mg by mouth daily.    fluticasone (FLONASE) 50 MCG/ACT nasal spray Place 2 sprays into both nostrils daily as needed for allergies or rhinitis.    HYDROcodone-acetaminophen (NORCO) 5-325 MG tablet Take 1 tablet by mouth every 6 (six) hours as needed for moderate pain (pain score 4-6).    Krill Oil 300 MG CAPS Take 300 mg by mouth daily.    Multiple Vitamins-Minerals (CENTRUM SILVER 50+WOMEN PO) Take 1 tablet by mouth daily.    valsartan (DIOVAN) 320 MG tablet Take 320 mg by mouth daily.    No facility-administered encounter medications on file as of 12/29/2022.    PAST MEDICAL HISTORY: Past Medical History:  Diagnosis Date   Anemia 09/24/1968   "after childbirth"   Arthritis    "feels like all over"   Carpal tunnel syndrome    Carpal tunnel syndrome    left   Cataract    immature on right   Chronic bronchitis (HCC)    "get it most years" (10/08/2014)   Chronic kidney disease    stage III, per Pt   Family history of anesthesia complication    daughter gets sick   History of blood transfusion    "w/one of my back OR's"   Hypertension    takes Hyzaar daily   Joint pain    "all over"   Joint swelling    Muscle spasms of lower extremity    takes Baclofen daily prn   Nocturia    OSA on CPAP    Peripheral vascular disease (HCC)  PFO (patent foramen ovale)    positive saline bubble study 05/12/09 ([facility])   Pneumonia 2010/ 2011   PONV (postoperative nausea and vomiting)    woke up during hysterectomy   Shortness of breath    with exertion;Albuterol prn   Urinary frequency     PAST SURGICAL HISTORY: Past Surgical History:  Procedure Laterality Date   ABDOMINAL HYSTERECTOMY  1970's?   APPENDECTOMY     BACK SURGERY     BREAST BIOPSY Right 1990's   CESAREAN SECTION  1962; 1963; 1970   CHEST TUBE INSERTION  80's   d/t pneumothorax   COLONOSCOPY     FRACTURE SURGERY     HARDWARE REMOVAL Right 1990's   "took screw out of my lower leg ~ 1 yr after putting it  in"   HEMILAMINOTOMY LUMBAR SPINE  02/2002   L5; decompression of the L5 and S1 nerve root; synovial cyst excision/notes 06/09/2010   KNEE ARTHROSCOPY Right ?2013   LUMBAR LAMINECTOMY/DECOMPRESSION MICRODISCECTOMY  02/2010   POSTERIOR LUMBAR FUSION  05/2003   L4-5 and S-1    TIBIA FRACTURE SURGERY Right 1990's   "put screw in"   TOTAL KNEE ARTHROPLASTY Right 08/28/2012   Procedure: TOTAL KNEE ARTHROPLASTY;  Surgeon: Valeria Batman, MD;  Location: [facility];  Service: Orthopedics;  Laterality: Right;   TOTAL KNEE ARTHROPLASTY Left 10/07/2014   TOTAL KNEE ARTHROPLASTY Left 10/07/2014   Procedure: TOTAL KNEE ARTHROPLASTY;  Surgeon: Valeria Batman, MD;  Location: [facility] OR;  Service: Orthopedics;  Laterality: Left;   TOTAL KNEE REVISION Left 10/08/2021   Procedure: LEFT TOTAL KNEE REVISION;  Surgeon: Kathryne Hitch, MD;  Location: [facility] ORS;  Service: Orthopedics;  Laterality: Left;    ALLERGIES: Allergies  Allergen Reactions   Levaquin [Levofloxacin] Swelling   Morphine And Codeine Nausea Only   Other Nausea And Vomiting    Darvocet-vomiting   Oxycodone Nausea Only   Tramadol Other (See Comments)    And dizziness   Naprosyn [Naproxen] Itching and Rash    FAMILY HISTORY: No family history on file.  SOCIAL HISTORY: Social History   Tobacco Use   Smoking status: Never   Smokeless tobacco: Never  Vaping Use   Vaping status: Never Used  Substance Use Topics   Alcohol use: No   Drug use: No   Social History   Social History Narrative   Not on file    Objective:  Vital Signs:  There were no vitals taken for this visit.  ***  Labs and Imaging review: Internal labs: 11/16/22: ESR: 72 (38 1 year prior) CMP: significant for glucose of 103, Cr 1.08 CBC w/ diff: significant for MCV of 97.4  Multiple myeloma panel (08/11/22): IgG kappa monoclonal gammopathy  External labs: ***  Imaging: MRA neck w/wo contrast (12/02/22): FINDINGS: AORTIC ARCH: Visualized aortic arch  within normal limits for caliber. Bovine branching pattern noted. No stenosis or other abnormality about the origin the great vessels.   RIGHT CAROTID SYSTEM: Right common and internal carotid arteries are mildly tortuous but patent with antegrade flow. No evidence for dissection. No hemodynamically significant stenosis about the right carotid artery system.   LEFT CAROTID SYSTEM: Left common and internal carotid arteries are mildly tortuous but patent with antegrade flow. No evidence for dissection. No hemodynamically significant stenosis about the left carotid artery system.   VERTEBRAL ARTERIES: Both vertebral arteries arise from subclavian arteries. No visible proximal subclavian artery stenosis. Right vertebral artery dominant. Left vertebral artery patent without dissection or  stenosis. On the right, there is a 5-6 mm outpouching extending medially from the distal right V2 segment (series 16, image 8), suspicious for a small pseudoaneurysm. No significant stenosis at this level. Dominant right vertebral artery otherwise patent without abnormality.   OTHER: None.   IMPRESSION: 1. 5-6 mm outpouching extending medially from the distal right V2 segment, suspicious for a small pseudoaneurysm. 2. Otherwise negative MRA of the neck. No other acute vascular abnormality. No hemodynamically significant or correctable stenosis. 3. Mild diffuse tortuosity the major arterial vasculature of the neck, suggesting chronic underlying hypertension.  MRI lumbar spine wo contrast (05/07/21): IMPRESSION: 1. Transitional lumbosacral anatomy. For the purposes of this dictation there is partial lumbarization of S1. Correlation with radiographs is recommended prior to any operative intervention. 2. At L2-L3, moderate canal stenosis with severe right greater than left foraminal stenosis. 3. At L3-L4, mild canal stenosis with moderate bilateral foraminal stenosis. 4. L4-S1 posterior fusion with  patent canal and foramina at these levels.  EKG (09/29/21): Normal, including PR interval ***  Assessment/Plan:  Diane Daugherty is a 82 y.o. female who presents for evaluation of ***. *** has a relevant medical history of ***. *** neurological examination is pertinent for ***. Available diagnostic data is significant for ***. This constellation of symptoms and objective data would most likely localize to ***. ***  PLAN: -Blood work: *** ***  -Return to clinic ***  The impression above as well as the plan as outlined below were extensively discussed with the patient (in the company of ***) who voiced understanding. All questions were answered to their satisfaction.  The patient was counseled on pertinent fall precautions per the printed material provided today, and as noted under the "Patient Instructions" section below.***  When available, results of the above investigations and possible further recommendations will be communicated to the patient via telephone/MyChart. Patient to call office if not contacted after expected testing turnaround time.   Total time spent reviewing records, interview, history/exam, documentation, and coordination of care on day of encounter:  *** min   Thank you for allowing me to participate in patient's care.  If I can answer any additional questions, I would be pleased to do so.  Jacquelyne Balint, MD   CC: [name], MD [address] [place] [address]  CC: Referring provider: [name], MD [address] [place],  [place] [address]

## 2022-12-26 NOTE — Telephone Encounter (Signed)
Given her multiple allergies, she does not have too many options.  Recommend to take half the hydrocodone along with plain Tylenol and see if it helps better.  Thanks.

## 2022-12-26 NOTE — Telephone Encounter (Signed)
Patient states that the hydrocodone is making her feel like a zombie and is not helping pain.  Can something else be sent in.  Please call patient and advise  [phone]

## 2022-12-27 NOTE — Telephone Encounter (Signed)
Do not recommend prednisone at present time.  We referred her to neurology.  Please look into this referral.  Thanks.

## 2022-12-27 NOTE — Telephone Encounter (Signed)
Spoke with patient and she understood. She sees neurology this Thursday

## 2022-12-29 ENCOUNTER — Ambulatory Visit (INDEPENDENT_AMBULATORY_CARE_PROVIDER_SITE_OTHER): Payer: HMO | Admitting: Neurology

## 2022-12-29 ENCOUNTER — Encounter: Payer: Self-pay | Admitting: Neurology

## 2022-12-29 ENCOUNTER — Other Ambulatory Visit: Payer: HMO

## 2022-12-29 VITALS — BP 180/90 | HR 98 | Ht 61.0 in | Wt 214.0 lb

## 2022-12-29 DIAGNOSIS — Z5181 Encounter for therapeutic drug level monitoring: Secondary | ICD-10-CM | POA: Diagnosis not present

## 2022-12-29 DIAGNOSIS — M542 Cervicalgia: Secondary | ICD-10-CM

## 2022-12-29 DIAGNOSIS — G4486 Cervicogenic headache: Secondary | ICD-10-CM | POA: Diagnosis not present

## 2022-12-29 MED ORDER — NORTRIPTYLINE HCL 10 MG PO CAPS
20.0000 mg | ORAL_CAPSULE | Freq: Every day | ORAL | 4 refills | Status: DC
Start: 2022-12-29 — End: 2023-11-29

## 2022-12-29 MED ORDER — METHYLPREDNISOLONE 4 MG PO TBPK: ORAL_TABLET | ORAL | 0 refills | Status: DC

## 2022-12-29 NOTE — Patient Instructions (Addendum)
I saw you today for headaches and neck pain. I think your headaches are being caused by the neck pain.  I would like to get some lab work today to look for any other causes as well. I will be in touch when I have those results.  I am referring you to physical therapy for your neck pain and stiffness.  You can try the baclofen you have to see if it helps.  For your headaches, I will give you another 6 day course of steroids. I will also start a medication to prevent headaches called Nortriptyline. You will take 1 capsule (10 mg) at bedtime for 1 week, then increase to 2 capsules (20 mg) at bedtime thereafter. It can make you sleepy or have a dry mouth.  I will see you back in clinic in 3 months. Please let me know if you have any questions or concerns in the meantime.  The physicians and staff at [facility] are committed to providing excellent care. You may receive a survey requesting feedback about your experience at our office. We strive to receive "very good" responses to the survey questions. If you feel that your experience would prevent you from giving the office a "very good " response, please contact our office to try to remedy the situation. We may be reached at [phone]. Thank you for taking the time out of your busy day to complete the survey.  Jacquelyne Balint, MD [facility]

## 2022-12-30 LAB — SEDIMENTATION RATE: Sed Rate: 41 mm/h — ABNORMAL HIGH (ref 0–30)

## 2022-12-30 LAB — TSH: TSH: 1.17 m[IU]/L (ref 0.40–4.50)

## 2022-12-30 LAB — VITAMIN B12: Vitamin B-12: 905 pg/mL (ref 200–1100)

## 2023-01-02 ENCOUNTER — Other Ambulatory Visit: Payer: Self-pay

## 2023-01-02 ENCOUNTER — Inpatient Hospital Stay: Payer: HMO | Attending: Licensed Clinical Social Worker | Admitting: Licensed Clinical Social Worker

## 2023-01-02 NOTE — Progress Notes (Signed)
[facility] Healthcare Advance Directives Clinical Social Work  Patient presented to Advance Directives Clinic  to review and complete healthcare advance directives.  Clinical Social Worker met with patient and pt's daughter, Myrene Buddy.  The patient designated Emeterio Reeve (daughter) as their primary healthcare agent and Suhailah Crites (daughter) as their secondary agent.  Patient also completed healthcare living will.    Documents were notarized and copies made for patient/family. Clinical Social Worker will send documents to medical records to be scanned into patient's chart. Clinical Social Worker encouraged patient/family to contact with any additional questions or concerns.   Moselle Rister E Renesmae Donahey, LCSW Clinical Social Worker [facility]

## 2023-01-04 ENCOUNTER — Telehealth: Payer: Self-pay | Admitting: Neurology

## 2023-01-04 NOTE — Telephone Encounter (Signed)
Pt called in stating she has been taking the nortriptyline, but it makes her confused, dizzy, and unsteady on her feet. She is having issues moving around in her home and she doesn't trust herself to drive. She has not doubled the dose yet. She would like to find out what she should do?

## 2023-01-11 ENCOUNTER — Ambulatory Visit: Payer: HMO | Admitting: Emergency Medicine

## 2023-01-11 ENCOUNTER — Encounter: Payer: Self-pay | Admitting: Emergency Medicine

## 2023-01-11 ENCOUNTER — Other Ambulatory Visit: Payer: Self-pay

## 2023-01-11 ENCOUNTER — Ambulatory Visit: Payer: HMO | Attending: Neurology

## 2023-01-11 VITALS — BP 168/82 | HR 100 | Temp 98.6°F | Ht 61.0 in | Wt 216.0 lb

## 2023-01-11 DIAGNOSIS — R293 Abnormal posture: Secondary | ICD-10-CM | POA: Insufficient documentation

## 2023-01-11 DIAGNOSIS — N1831 Chronic kidney disease, stage 3a: Secondary | ICD-10-CM | POA: Diagnosis not present

## 2023-01-11 DIAGNOSIS — R252 Cramp and spasm: Secondary | ICD-10-CM | POA: Diagnosis not present

## 2023-01-11 DIAGNOSIS — M542 Cervicalgia: Secondary | ICD-10-CM | POA: Diagnosis not present

## 2023-01-11 DIAGNOSIS — R519 Headache, unspecified: Secondary | ICD-10-CM | POA: Diagnosis not present

## 2023-01-11 DIAGNOSIS — D472 Monoclonal gammopathy: Secondary | ICD-10-CM

## 2023-01-11 DIAGNOSIS — M6281 Muscle weakness (generalized): Secondary | ICD-10-CM | POA: Diagnosis not present

## 2023-01-11 DIAGNOSIS — I10 Essential (primary) hypertension: Secondary | ICD-10-CM

## 2023-01-11 MED ORDER — VALSARTAN-HYDROCHLOROTHIAZIDE 320-12.5 MG PO TABS: 1.0000 | ORAL_TABLET | Freq: Every day | ORAL | 3 refills | Status: DC

## 2023-01-11 MED ORDER — BACLOFEN 10 MG PO TABS: 10.0000 mg | ORAL_TABLET | Freq: Three times a day (TID) | ORAL | 0 refills | Status: DC | PRN

## 2023-01-11 NOTE — Assessment & Plan Note (Signed)
Stable chronic condition Advised to stay well-hydrated and avoid NSAIDs

## 2023-01-11 NOTE — Assessment & Plan Note (Signed)
BP Readings from Last 3 Encounters:  01/11/23 (!) 168/82  12/29/22 (!) 180/90  12/20/22 (!) 152/90  Elevated blood pressure readings at home as well. Recommend to continue amlodipine 5 mg daily and change valsartan to valsartan HCTZ 320-12.5 mg daily Cardiovascular risks associated with uncontrolled hypertension discussed Dietary approaches to stop hypertension discussed

## 2023-01-11 NOTE — Assessment & Plan Note (Signed)
Clinically stable.  No red flag signs or symptoms. Normal neurological exam. Recent visit with vascular surgeon reviewed.  Does not think patient has vasculitis. MRA of neck done on 12/02/2022.  No vasculitis seen on report. Was evaluated by neurologist Headaches much improved.  No concerns.

## 2023-01-11 NOTE — Progress Notes (Signed)
Diane Daugherty 82 y.o.   No chief complaint on file.   HISTORY OF PRESENT ILLNESS: This is a 82 y.o. female here for follow-up of chronic medical conditions including chronic headache and hypertension.  States blood pressure at home have been persistently elevated. Since our last office visit she was able to follow-up with neurologist Headache much improved.  Scheduled for physical therapy this afternoon. Did not tolerate nortriptyline.  Taking baclofen as needed. No other complaints or medical concerns today. Workup for vasculitis was negative. Most recent sed rate done came back less than before but still elevated Office visit assessment and plan as follows: Assessment/Plan:  Diane Daugherty is a 82 y.o. female who presents for evaluation of headache and neck pain. She has a relevant medical history of smoldering multiple myeloma, HTN, OSA (on CPAP), CKD, carpal tunnel syndrome. Her neurological examination is essentially normal. 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 There are some migrainous features and she has a family history of migraines, however, until her neck pain, she did not really have headaches, so this is less clear. GCA is another consideration given her elevated ESR, but she has no other symptoms of GCA. I will monitor this closely though.   PLAN: -Blood work: B12, TSH, ESR -Start Nortriptyline 10 mg at bedtime for 1 week, then increase to 20 mg thereafter -Medrol dose pack -PT for neck pain -Okay to try baclofen if she wants for neck tightness   -Return to clinic in 3 months  HPI   Prior to Admission medications   Medication Sig Start Date End Date Taking? Authorizing Provider  acetaminophen (TYLENOL) 500 MG tablet Take 500 mg by mouth every 6 (six) hours as needed.   Yes [provider]  amLODipine (NORVASC) 5 MG tablet Take 1 tablet (5 mg total) by mouth daily. 02/28/22 02/23/23 Yes Babbette Dalesandro, Eilleen Kempf, MD  Calcium  Citrate-Vitamin D (CALCIUM CITRATE + D PO) Take 1 tablet by mouth daily.   Yes [provider]  CHELATED IRON PO Take 27 mg by mouth daily.   Yes [provider]  fluticasone (FLONASE) 50 MCG/ACT nasal spray Place 2 sprays into both nostrils daily as needed for allergies or rhinitis. 05/27/21  Yes Willella Harding, Eilleen Kempf, MD  HYDROcodone-acetaminophen (NORCO) 5-325 MG tablet Take 1 tablet by mouth every 6 (six) hours as needed for moderate pain (pain score 4-6). 12/20/22  Yes Salene Mohamud, Eilleen Kempf, MD  Krill Oil 300 MG CAPS Take 300 mg by mouth daily.   Yes [provider]  Multiple Vitamins-Minerals (CENTRUM SILVER 50+WOMEN PO) Take 1 tablet by mouth daily.   Yes [provider]  nortriptyline (PAMELOR) 10 MG capsule Take 2 capsules (20 mg total) by mouth at bedtime. Take 1 capsule (10 mg) at bedtime for 1 week, then increase to 2 capsules (20 mg) at bedtime thereafter 12/29/22  Yes [name], MD  valsartan-hydrochlorothiazide (DIOVAN-HCT) 320-12.5 MG tablet Take 1 tablet by mouth daily. 01/11/23  Yes Melvenia Favela, Eilleen Kempf, MD  baclofen (LIORESAL) 10 MG tablet Take 1 tablet (10 mg total) by mouth 3 (three) times daily as needed for muscle spasms. 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   Stage 3a chronic kidney disease (HCC) 05/27/2021   History of multiple myeloma  05/27/2021   Primary osteoarthritis of left knee 10/07/2014   History of left knee replacement 10/07/2014   Asthma, chronic 08/30/2012   Essential hypertension 08/30/2012   Osteoarthritis of right knee 08/30/2012   OSA on CPAP 08/30/2012   Severe obesity (BMI >= 40) (HCC) 08/30/2012    Past Medical History:  Diagnosis Date   Anemia 09/24/1968   "after childbirth"   Arthritis    "feels like all over"   Carpal tunnel syndrome    Carpal tunnel syndrome    left   Cataract    immature on right   Chronic bronchitis (HCC)    "get it most years" (10/08/2014)   Chronic kidney disease    stage III, per Pt   Family history of anesthesia complication    daughter gets sick   History of blood transfusion    "w/one of my back OR's"   Hypertension    takes Hyzaar daily   Joint pain    "all over"   Joint swelling    Muscle spasms of lower extremity    takes Baclofen daily prn   Nocturia    OSA on CPAP    Peripheral vascular disease (HCC)    PFO (patent foramen ovale)    positive saline bubble study 05/12/09 ([facility])   Pneumonia 2010/ 2011   PONV (postoperative nausea and vomiting)    woke up during hysterectomy   Shortness of breath    with exertion;Albuterol prn   Urinary frequency     Past Surgical History:  Procedure Laterality Date   ABDOMINAL HYSTERECTOMY  1970's?   APPENDECTOMY     BACK SURGERY     BREAST BIOPSY Right 1990's   CESAREAN SECTION  1962; 1963; 1970   CHEST TUBE INSERTION  80's   d/t pneumothorax   COLONOSCOPY     FRACTURE SURGERY     HARDWARE REMOVAL Right 1990's   "took screw out of my lower leg ~ 1 yr after putting it in"   HEMILAMINOTOMY LUMBAR SPINE  02/2002   L5; decompression of the L5 and S1 nerve root; synovial cyst excision/notes 06/09/2010   KNEE ARTHROSCOPY Right ?2013   LUMBAR LAMINECTOMY/DECOMPRESSION MICRODISCECTOMY  02/2010   POSTERIOR LUMBAR FUSION  05/2003   L4-5 and S-1    TIBIA FRACTURE SURGERY Right 1990's   "put screw in"    TOTAL KNEE ARTHROPLASTY Right 08/28/2012   Procedure: TOTAL KNEE ARTHROPLASTY;  Surgeon: Valeria Batman, MD;  Location: [facility] OR;  Service: Orthopedics;  Laterality: Right;   TOTAL KNEE ARTHROPLASTY Left 10/07/2014   TOTAL KNEE ARTHROPLASTY Left 10/07/2014   Procedure: TOTAL KNEE ARTHROPLASTY;  Surgeon: Valeria Batman, MD;  Location: [facility] OR;  Service: Orthopedics;  Laterality: Left;   TOTAL KNEE REVISION Left 10/08/2021   Procedure: LEFT TOTAL KNEE REVISION;  Surgeon: Kathryne Hitch, MD;  Location: [facility] ORS;  Service: Orthopedics;  Laterality: Left;    Social History   Socioeconomic History   Marital status: Divorced    Spouse name: Not on file   Number of children: Not on file  Years of education: Not on file   Highest education level: 12th grade  Occupational History   Not on file  Tobacco Use   Smoking status: Never   Smokeless tobacco: Never  Vaping Use   Vaping status: Never Used  Substance and Sexual Activity   Alcohol use: No   Drug use: No   Sexual activity: Not Currently  Other Topics Concern   Not on file  Social History Narrative   Are you right handed or left handed? Right   Are you currently employed ?    What is your current occupation? Retired/disability   Do you live at home alone?   Who lives with you? daughter   What type of home do you live in: 1 story or 2 story? two   Caffeine I in am    Social Drivers of Health   Financial Resource Strain: Low Risk  (01/07/2023)   Overall Financial Resource Strain (CARDIA)    Difficulty of Paying Living Expenses: Not very hard  Food Insecurity: No Food Insecurity (01/07/2023)   Hunger Vital Sign    Worried About Running Out of Food in the Last Year: Never true    Ran Out of Food in the Last Year: Never true  Transportation Needs: No Transportation Needs (01/07/2023)   PRAPARE - Administrator, Civil Service (Medical): No    Lack of Transportation (Non-Medical): No  Physical Activity:  Insufficiently Active (01/07/2023)   Exercise Vital Sign    Days of Exercise per Week: 2 days    Minutes of Exercise per Session: 20 min  Stress: Stress Concern Present (01/07/2023)   [employer] of Occupational Health - Occupational Stress Questionnaire    Feeling of Stress : To some extent  Social Connections: Moderately Isolated (01/07/2023)   Social Connection and Isolation Panel [NHANES]    Frequency of Communication with Friends and Family: Once a week    Frequency of Social Gatherings with Friends and Family: Once a week    Attends Religious Services: More than 4 times per year    Active Member of [employer] or Organizations: Yes    Attends Banker Meetings: More than 4 times per year    Marital Status: Divorced  Intimate Partner Violence: Not At Risk (04/12/2022)   Humiliation, Afraid, Rape, and Kick questionnaire    Fear of Current or Ex-Partner: No    Emotionally Abused: No    Physically Abused: No    Sexually Abused: No    History reviewed. No pertinent family history.   Review of Systems  Constitutional: Negative.  Negative for chills and fever.  HENT: Negative.  Negative for congestion and sore throat.   Eyes: Negative.   Respiratory: Negative.  Negative for cough and shortness of breath.   Cardiovascular: Negative.  Negative for chest pain and palpitations.  Gastrointestinal:  Negative for abdominal pain, diarrhea, nausea and vomiting.  Genitourinary: Negative.  Negative for dysuria and hematuria.  Musculoskeletal:  Positive for neck pain.  Skin: Negative.  Negative for rash.  Neurological:  Positive for headaches.  All other systems reviewed and are negative.   Vitals:   01/11/23 1049  BP: (!) 168/82  Pulse: 100  Temp: 98.6 F (37 C)  SpO2: 96%    Physical Exam Vitals reviewed.  Constitutional:      Appearance: Normal appearance.  HENT:     Head: Normocephalic.     Mouth/Throat:     Mouth: Mucous membranes are moist.  Pharynx:  Oropharynx is clear.  Eyes:     Extraocular Movements: Extraocular movements intact.     Conjunctiva/sclera: Conjunctivae normal.     Pupils: Pupils are equal, round, and reactive to light.  Cardiovascular:     Rate and Rhythm: Normal rate and regular rhythm.     Pulses: Normal pulses.     Heart sounds: Normal heart sounds.  Pulmonary:     Effort: Pulmonary effort is normal.     Breath sounds: Normal breath sounds.  Musculoskeletal:     Cervical back: No tenderness.  Lymphadenopathy:     Cervical: No cervical adenopathy.  Skin:    General: Skin is warm and dry.     Capillary Refill: Capillary refill takes less than 2 seconds.  Neurological:     General: No focal deficit present.     Mental Status: She is alert and oriented to person, place, and time.  Psychiatric:        Mood and Affect: Mood normal.        Behavior: Behavior normal.      ASSESSMENT & PLAN: A total of 46 minutes was spent with the patient and counseling/coordination of care regarding preparing for this visit, review of most recent office visit notes, review of multiple chronic medical conditions and their management, cardiovascular risks associated with uncontrolled hypertension, review of all medications and changes made, review of most recent bloodwork results, review of health maintenance items, education on nutrition, prognosis, documentation, and need for follow up.   Problem List Items Addressed This Visit       Cardiovascular and Mediastinum   Essential hypertension - Primary   BP Readings from Last 3 Encounters:  01/11/23 (!) 168/82  12/29/22 (!) 180/90  12/20/22 (!) 152/90  Elevated blood pressure readings at home as well. Recommend to continue amlodipine 5 mg daily and change valsartan to valsartan HCTZ 320-12.5 mg daily Cardiovascular risks associated with uncontrolled hypertension discussed Dietary approaches to stop hypertension discussed       Relevant Medications    valsartan-hydrochlorothiazide (DIOVAN-HCT) 320-12.5 MG tablet     Genitourinary   Stage 3a chronic kidney disease (HCC)   Stable chronic condition. Advised to stay well-hydrated and avoid NSAIDs        Other   Smoldering multiple myeloma   Stable without complications. No recent infections. Follows up with oncologist on a regular basis Last office visit notes reviewed      Persistent headaches   Clinically stable.  No red flag signs or symptoms. Normal neurological exam. Recent visit with vascular surgeon reviewed.  Does not think patient has vasculitis. MRA of neck done on 12/02/2022.  No vasculitis seen on report. Was evaluated by neurologist Headaches much improved.  No concerns.      Relevant Medications   baclofen (LIORESAL) 10 MG tablet   Patient Instructions  Hypertension, Adult High blood pressure (hypertension) is when the force of blood pumping through the arteries is too strong. The arteries are the blood vessels that carry blood from the heart throughout the body. Hypertension forces the heart to work harder to pump blood and may cause arteries to become narrow or stiff. Untreated or uncontrolled hypertension can lead to a heart attack, heart failure, a stroke, kidney disease, and other problems. A blood pressure reading consists of a higher number over a lower number. Ideally, your blood pressure should be below 120/80. The first ("top") number is called the systolic pressure. It is a measure of the pressure in your  arteries as your heart beats. The second ("bottom") number is called the diastolic pressure. It is a measure of the pressure in your arteries as the heart relaxes. What are the causes? The exact cause of this condition is not known. There are some conditions that result in high blood pressure. What increases the risk? Certain factors may make you more likely to develop high blood pressure. Some of these risk factors are under your control,  including: Smoking. Not getting enough exercise or physical activity. Being overweight. Having too much fat, sugar, calories, or salt (sodium) in your diet. Drinking too much alcohol. Other risk factors include: Having a personal history of heart disease, diabetes, high cholesterol, or kidney disease. Stress. Having a family history of high blood pressure and high cholesterol. Having obstructive sleep apnea. Age. The risk increases with age. What are the signs or symptoms? High blood pressure may not cause symptoms. Very high blood pressure (hypertensive crisis) may cause: Headache. Fast or irregular heartbeats (palpitations). Shortness of breath. Nosebleed. Nausea and vomiting. Vision changes. Severe chest pain, dizziness, and seizures. How is this diagnosed? This condition is diagnosed by measuring your blood pressure while you are seated, with your arm resting on a flat surface, your legs uncrossed, and your feet flat on the floor. The cuff of the blood pressure monitor will be placed directly against the skin of your upper arm at the level of your heart. Blood pressure should be measured at least twice using the same arm. Certain conditions can cause a difference in blood pressure between your right and left arms. If you have a high blood pressure reading during one visit or you have normal blood pressure with other risk factors, you may be asked to: Return on a different day to have your blood pressure checked again. Monitor your blood pressure at home for 1 week or longer. If you are diagnosed with hypertension, you may have other blood or imaging tests to help your health care provider understand your overall risk for other conditions. How is this treated? This condition is treated by making healthy lifestyle changes, such as eating healthy foods, exercising more, and reducing your alcohol intake. You may be referred for counseling on a healthy diet and physical activity. Your  health care provider may prescribe medicine if lifestyle changes are not enough to get your blood pressure under control and if: Your systolic blood pressure is above 130. Your diastolic blood pressure is above 80. Your personal target blood pressure may vary depending on your medical conditions, your age, and other factors. Follow these instructions at home: Eating and drinking  Eat a diet that is high in fiber and potassium, and low in sodium, added sugar, and fat. An example of this eating plan is called the DASH diet. DASH stands for Dietary Approaches to Stop Hypertension. To eat this way: Eat plenty of fresh fruits and vegetables. Try to fill one half of your plate at each meal with fruits and vegetables. Eat whole grains, such as whole-wheat pasta, brown rice, or whole-grain bread. Fill about one fourth of your plate with whole grains. Eat or drink low-fat dairy products, such as skim milk or low-fat yogurt. Avoid fatty cuts of meat, processed or cured meats, and poultry with skin. Fill about one fourth of your plate with lean proteins, such as fish, chicken without skin, beans, eggs, or tofu. Avoid pre-made and processed foods. These tend to be higher in sodium, added sugar, and fat. Reduce your daily sodium intake.  Many people with hypertension should eat less than 1,500 mg of sodium a day. Do not drink alcohol if: Your health care provider tells you not to drink. You are pregnant, may be pregnant, or are planning to become pregnant. If you drink alcohol: Limit how much you have to: 0-1 drink a day for women. 0-2 drinks a day for men. Know how much alcohol is in your drink. In the [place] one drink equals one 12 oz bottle of beer (355 mL), one 5 oz glass of wine (148 mL), or one 1 oz glass of hard liquor (44 mL). Lifestyle  Work with your health care provider to maintain a healthy body weight or to lose weight. Ask what an ideal weight is for you. Get at least 30 minutes of exercise  that causes your heart to beat faster (aerobic exercise) most days of the week. Activities may include walking, swimming, or biking. Include exercise to strengthen your muscles (resistance exercise), such as Pilates or lifting weights, as part of your weekly exercise routine. Try to do these types of exercises for 30 minutes at least 3 days a week. Do not use any products that contain nicotine or tobacco. These products include cigarettes, chewing tobacco, and vaping devices, such as e-cigarettes. If you need help quitting, ask your health care provider. Monitor your blood pressure at home as told by your health care provider. Keep all follow-up visits. This is important. Medicines Take over-the-counter and prescription medicines only as told by your health care provider. Follow directions carefully. Blood pressure medicines must be taken as prescribed. Do not skip doses of blood pressure medicine. Doing this puts you at risk for problems and can make the medicine less effective. Ask your health care provider about side effects or reactions to medicines that you should watch for. Contact a health care provider if you: Think you are having a reaction to a medicine you are taking. Have headaches that keep coming back (recurring). Feel dizzy. Have swelling in your ankles. Have trouble with your vision. Get help right away if you: Develop a severe headache or confusion. Have unusual weakness or numbness. Feel faint. Have severe pain in your chest or abdomen. Vomit repeatedly. Have trouble breathing. These symptoms may be an emergency. Get help right away. Call 911. Do not wait to see if the symptoms will go away. Do not drive yourself to the hospital. Summary Hypertension is when the force of blood pumping through your arteries is too strong. If this condition is not controlled, it may put you at risk for serious complications. Your personal target blood pressure may vary depending on your  medical conditions, your age, and other factors. For most people, a normal blood pressure is less than 120/80. Hypertension is treated with lifestyle changes, medicines, or a combination of both. Lifestyle changes include losing weight, eating a healthy, low-sodium diet, exercising more, and limiting alcohol. This information is not intended to replace advice given to you by your health care provider. Make sure you discuss any questions you have with your health care provider. Document Revised: 11/17/2020 Document Reviewed: 11/17/2020 Elsevier Patient Education  2024 Elsevier Inc.      Edwina Barth, MD [facility] at [facility]

## 2023-01-11 NOTE — Assessment & Plan Note (Signed)
Stable without complications. No recent infections. Follows up with oncologist on a regular basis Last office visit notes reviewed

## 2023-01-11 NOTE — Therapy (Signed)
OUTPATIENT PHYSICAL THERAPY CERVICAL EVALUATION   Patient Name: Diane Daugherty MRN: [id number] DOB:01/06/1941, 82 y.o., female Today's Date: 01/11/2023  END OF SESSION:  PT End of Session - 01/11/23 1407     Visit Number 1    Date for PT Re-Evaluation 03/08/23    Authorization Type [employer]    PT Start Time 1408    PT Stop Time 1450    PT Time Calculation (min) 42 min    Activity Tolerance Patient limited by pain    Behavior During Therapy [employer] for tasks assessed/performed             Past Medical History:  Diagnosis Date   Anemia 09/24/1968   "after childbirth"   Arthritis    "feels like all over"   Carpal tunnel syndrome    Carpal tunnel syndrome    left   Cataract    immature on right   Chronic bronchitis (HCC)    "get it most years" (10/08/2014)   Chronic kidney disease    stage III, per Pt   Family history of anesthesia complication    daughter gets sick   History of blood transfusion    "w/one of my back OR's"   Hypertension    takes Hyzaar daily   Joint pain    "all over"   Joint swelling    Muscle spasms of lower extremity    takes Baclofen daily prn   Nocturia    OSA on CPAP    Peripheral vascular disease (HCC)    PFO (patent foramen ovale)    positive saline bubble study 05/12/09 ([place])   Pneumonia 2010/ 2011   PONV (postoperative nausea and vomiting)    woke up during hysterectomy   Shortness of breath    with exertion;Albuterol prn   Urinary frequency    Past Surgical History:  Procedure Laterality Date   ABDOMINAL HYSTERECTOMY  1970's?   APPENDECTOMY     BACK SURGERY     BREAST BIOPSY Right 1990's   CESAREAN SECTION  1962; 1963; 1970   CHEST TUBE INSERTION  80's   d/t pneumothorax   COLONOSCOPY     FRACTURE SURGERY     HARDWARE REMOVAL Right 1990's   "took screw out of my lower leg ~ 1 yr after putting it in"   HEMILAMINOTOMY LUMBAR SPINE  02/2002   L5; decompression of the L5 and S1 nerve root; synovial cyst  excision/notes 06/09/2010   KNEE ARTHROSCOPY Right ?2013   LUMBAR LAMINECTOMY/DECOMPRESSION MICRODISCECTOMY  02/2010   POSTERIOR LUMBAR FUSION  05/2003   L4-5 and S-1    TIBIA FRACTURE SURGERY Right 1990's   "put screw in"   TOTAL KNEE ARTHROPLASTY Right 08/28/2012   Procedure: TOTAL KNEE ARTHROPLASTY;  Surgeon: Valeria Batman, MD;  Location: [facility];  Service: Orthopedics;  Laterality: Right;   TOTAL KNEE ARTHROPLASTY Left 10/07/2014   TOTAL KNEE ARTHROPLASTY Left 10/07/2014   Procedure: TOTAL KNEE ARTHROPLASTY;  Surgeon: Valeria Batman, MD;  Location[facility] OR;  Service: Orthopedics;  Laterality: Left;   TOTAL KNEE REVISION Left 10/08/2021   Procedure: LEFT TOTAL KNEE REVISION;  Surgeon: Kathryne Hitch, MD;  Location: [facility] ORS;  Service: Orthopedics;  Laterality: Left;   Patient Active Problem List   Diagnosis Date Noted   Persistent headaches 12/20/2022   Neck pain 11/17/2022   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   Status post revision of total replacement of left knee 10/08/2021   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   Stage 3a chronic kidney disease (HCC) 05/27/2021   History of multiple myeloma 05/27/2021   Primary osteoarthritis of left knee 10/07/2014   History of left knee replacement 10/07/2014   Asthma, chronic 08/30/2012   Essential hypertension 08/30/2012   Osteoarthritis of right knee 08/30/2012   OSA on CPAP 08/30/2012   Severe obesity (BMI >= 40) (HCC) 08/30/2012    PCP: [name], MD  REFERRING PROVIDER: [name], MD  REFERRING DIAG: M54.2 (ICD-10-CM) - Cervicalgia  THERAPY DIAG:  Cervicalgia - Plan: PT plan of care cert/re-cert  Cramp and spasm - Plan: PT plan of care cert/re-cert  Muscle weakness (generalized) - Plan: PT plan of care cert/re-cert  Abnormal posture - Plan: PT plan of care cert/re-cert  Rationale for Evaluation and Treatment: Rehabilitation  ONSET DATE:  12/29/2022  SUBJECTIVE:                                                                                                                                                                                                         SUBJECTIVE STATEMENT: Patient reports pain since August 24th.  She woke up with her neck stiff and it never went away.  She's also started having headaches.  She is taking a muscle relaxer and neuro prescribed something for her headaches.  She was not able to take the medication from the neuro doc.  This made her confused and nauseated.  She is retired and does just basics around the house.  Her usual housework including.  Hand dominance: Right  PERTINENT HISTORY:  Lumbar fusion several years ago  PAIN:  Are you having pain? Yes: NPRS scale: 4-5 currently, but can get to a 10 at times/10 Pain location: neck R > L Pain description: aching but occasionally sharp Aggravating factors: taking a shower, getting dressed or bending over, laying on pillow Relieving factors: rest, pillow propping  PRECAUTIONS: None  RED FLAGS: None     WEIGHT BEARING RESTRICTIONS: No  FALLS:  Has patient fallen in last 6 months? 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  DIAGNOSTIC  FINDINGS:  No specif  PATIENT SURVEYS:  FOTO 50, predicted 58  COGNITION: Overall cognitive status: Within functional limits for tasks  assessed  SENSATION: WFL  POSTURE: rounded shoulders and forward head  PALPATION: Taut bands and trigger point bilateral upper traps, levators and rhomboids   CERVICAL ROM:   Active ROM A/PROM (deg) eval  Flexion 32  Extension 40  Right lateral flexion 35  Left lateral flexion 38  Right rotation 50  Left rotation 60   (Blank rows = not tested)  UPPER EXTREMITY ROM:   UPPER EXTREMITY MMT:  Generally   CERVICAL SPECIAL TESTS:  Spurling's test: Positive and Distraction test: Positive  TODAY'S TREATMENT:                                                                                                                              DATE: 01/11/23 Initial eval completed and initiated HEP   PATIENT EDUCATION:  Education details: Initiated HEP Person educated: Patient Education method: Programmer, multimedia, Facilities manager, Verbal cues, and Handouts Education comprehension: verbalized understanding, returned demonstration, and verbal cues required  HOME EXERCISE PROGRAM: Access Code: [email or web address] URL: [email or web address] Date: 01/11/2023 Prepared by: Mikey Kirschner  Exercises - Shoulder Rolls in Sitting  - 1 x daily - 7 x weekly - 1 sets - 10 reps - Seated Cervical Retraction  - 1 x daily - 7 x weekly - 1 sets - 10 reps - Seated Cervical Rotation AROM  - 1 x daily - 7 x weekly - 1 sets - 10 reps - Seated Cervical Sidebending AROM  - 1 x daily - 7 x weekly - 1 sets - 10 reps - Doorway Pec Stretch at 90 Degrees Abduction  - 1 x daily - 7 x weekly - 1 sets - 10 reps - 10 sec hold - Wrist Extension Stretch at Wall  - 1 x daily - 7 x weekly - 1 sets - 10 reps - 10 sec hold  ASSESSMENT:  CLINICAL IMPRESSION: Patient is a 82 y.o. female who was seen today for physical therapy evaluation and treatment for neck pain.  She presents with decreased C spine ROM, UE strength and postural strength along with elevated pain.  She would benefit from skilled PT for postural  strengthening, C spine ROM, DN and pain control.    OBJECTIVE IMPAIRMENTS: decreased ROM, decreased strength, increased fascial restrictions, increased muscle spasms, impaired flexibility, impaired UE functional use, postural dysfunction, and pain.   ACTIVITY LIMITATIONS: carrying, lifting, sleeping, transfers, bed mobility, bathing, toileting, dressing, reach over head, hygiene/grooming, and caring for others  PARTICIPATION LIMITATIONS: meal prep, cleaning, laundry, driving, shopping, community activity, and church  PERSONAL FACTORS: Age, Fitness, and 1-2 comorbidities: CKD and chronic bronchitis  are also affecting patient's functional outcome.   REHAB POTENTIAL: Good  CLINICAL DECISION MAKING: Stable/uncomplicated  EVALUATION COMPLEXITY: Low   GOALS: Goals reviewed with patient? Yes  SHORT TERM GOALS: Target date: 02/08/2023   Pain report to be no  greater than 4/10  Baseline:  Goal status: INITIAL  2.  Patient will be independent with initial HEP  Baseline:  Goal status: INITIAL  3.  Patient to be able to reach back of head for grooming with bilateral shoulders Baseline:  Goal status: INITIAL  Goal status: INITIAL  LONG TERM GOALS: Target date: 03/08/2023  Patient to report pain no greater than 2/10  Baseline:  Goal status: INITIAL  2.  Patient will be able to reach overhead into cabinets and on top of shelves without pain  Baseline:  Goal status: INITIAL  3.  Patient to be able to sleep through the night  Baseline:  Goal status: INITIAL  4.  Patient to be independent with advanced HEP  Baseline:  Goal status: INITIAL  5.  Patient to report 85% improvement in overall symptoms  Baseline:  Goal status: INITIAL  6.  Patient to be able to do all ADL's and IADL's without exacerbation of shoulder and neck pain Baseline:  Goal status: INITIAL   PLAN:  PT FREQUENCY: 1-2x/week  PT DURATION: 8 weeks  PLANNED INTERVENTIONS: 97110-Therapeutic exercises,  97530- Therapeutic activity, [id number]- Neuromuscular re-education, 97535- Self Care, 16109- Manual therapy, [id number]- Gait training, [id number]- Aquatic Therapy, 97014- Electrical stimulation (unattended), [id number]- Electrical stimulation (manual), [id number]- Ultrasound, [id number]- Traction (mechanical), [id number]- Ionotophoresis 4mg /ml Dexamethasone, Patient/Family education, Taping, Dry Needling, Spinal mobilization, Scar mobilization, Cryotherapy, and Moist heat  PLAN FOR NEXT SESSION: Review HEP,  UBE, manual techniques, MELT   Eulamae Greenstein B. Kiran Lapine, PT 01/11/23 4:20 PM [facility] [address] [place], [place] [address] Phone # [phone] Fax [phone]

## 2023-01-11 NOTE — Patient Instructions (Signed)
Hypertension, Adult High blood pressure (hypertension) is when the force of blood pumping through the arteries is too strong. The arteries are the blood vessels that carry blood from the heart throughout the body. Hypertension forces the heart to work harder to pump blood and may cause arteries to become narrow or stiff. Untreated or uncontrolled hypertension can lead to a heart attack, heart failure, a stroke, kidney disease, and other problems. A blood pressure reading consists of a higher number over a lower number. Ideally, your blood pressure should be below 120/80. The first ("top") number is called the systolic pressure. It is a measure of the pressure in your arteries as your heart beats. The second ("bottom") number is called the diastolic pressure. It is a measure of the pressure in your arteries as the heart relaxes. What are the causes? The exact cause of this condition is not known. There are some conditions that result in high blood pressure. What increases the risk? Certain factors may make you more likely to develop high blood pressure. Some of these risk factors are under your control, including: Smoking. Not getting enough exercise or physical activity. Being overweight. Having too much fat, sugar, calories, or salt (sodium) in your diet. Drinking too much alcohol. Other risk factors include: Having a personal history of heart disease, diabetes, high cholesterol, or kidney disease. Stress. Having a family history of high blood pressure and high cholesterol. Having obstructive sleep apnea. Age. The risk increases with age. What are the signs or symptoms? High blood pressure may not cause symptoms. Very high blood pressure (hypertensive crisis) may cause: Headache. Fast or irregular heartbeats (palpitations). Shortness of breath. Nosebleed. Nausea and vomiting. Vision changes. Severe chest pain, dizziness, and seizures. How is this diagnosed? This condition is diagnosed by  measuring your blood pressure while you are seated, with your arm resting on a flat surface, your legs uncrossed, and your feet flat on the floor. The cuff of the blood pressure monitor will be placed directly against the skin of your upper arm at the level of your heart. Blood pressure should be measured at least twice using the same arm. Certain conditions can cause a difference in blood pressure between your right and left arms. If you have a high blood pressure reading during one visit or you have normal blood pressure with other risk factors, you may be asked to: Return on a different day to have your blood pressure checked again. Monitor your blood pressure at home for 1 week or longer. If you are diagnosed with hypertension, you may have other blood or imaging tests to help your health care provider understand your overall risk for other conditions. How is this treated? This condition is treated by making healthy lifestyle changes, such as eating healthy foods, exercising more, and reducing your alcohol intake. You may be referred for counseling on a healthy diet and physical activity. Your health care provider may prescribe medicine if lifestyle changes are not enough to get your blood pressure under control and if: Your systolic blood pressure is above 130. Your diastolic blood pressure is above 80. Your personal target blood pressure may vary depending on your medical conditions, your age, and other factors. Follow these instructions at home: Eating and drinking  Eat a diet that is high in fiber and potassium, and low in sodium, added sugar, and fat. An example of this eating plan is called the DASH diet. DASH stands for Dietary Approaches to Stop Hypertension. To eat this way: Eat   plenty of fresh fruits and vegetables. Try to fill one half of your plate at each meal with fruits and vegetables. Eat whole grains, such as whole-wheat pasta, brown rice, or whole-grain bread. Fill about one  fourth of your plate with whole grains. Eat or drink low-fat dairy products, such as skim milk or low-fat yogurt. Avoid fatty cuts of meat, processed or cured meats, and poultry with skin. Fill about one fourth of your plate with lean proteins, such as fish, chicken without skin, beans, eggs, or tofu. Avoid pre-made and processed foods. These tend to be higher in sodium, added sugar, and fat. Reduce your daily sodium intake. Many people with hypertension should eat less than 1,500 mg of sodium a day. Do not drink alcohol if: Your health care provider tells you not to drink. You are pregnant, may be pregnant, or are planning to become pregnant. If you drink alcohol: Limit how much you have to: 0-1 drink a day for women. 0-2 drinks a day for men. Know how much alcohol is in your drink. In the [place] one drink equals one 12 oz bottle of beer (355 mL), one 5 oz glass of wine (148 mL), or one 1 oz glass of hard liquor (44 mL). Lifestyle  Work with your health care provider to maintain a healthy body weight or to lose weight. Ask what an ideal weight is for you. Get at least 30 minutes of exercise that causes your heart to beat faster (aerobic exercise) most days of the week. Activities may include walking, swimming, or biking. Include exercise to strengthen your muscles (resistance exercise), such as Pilates or lifting weights, as part of your weekly exercise routine. Try to do these types of exercises for 30 minutes at least 3 days a week. Do not use any products that contain nicotine or tobacco. These products include cigarettes, chewing tobacco, and vaping devices, such as e-cigarettes. If you need help quitting, ask your health care provider. Monitor your blood pressure at home as told by your health care provider. Keep all follow-up visits. This is important. Medicines Take over-the-counter and prescription medicines only as told by your health care provider. Follow directions carefully. Blood  pressure medicines must be taken as prescribed. Do not skip doses of blood pressure medicine. Doing this puts you at risk for problems and can make the medicine less effective. Ask your health care provider about side effects or reactions to medicines that you should watch for. Contact a health care provider if you: Think you are having a reaction to a medicine you are taking. Have headaches that keep coming back (recurring). Feel dizzy. Have swelling in your ankles. Have trouble with your vision. Get help right away if you: Develop a severe headache or confusion. Have unusual weakness or numbness. Feel faint. Have severe pain in your chest or abdomen. Vomit repeatedly. Have trouble breathing. These symptoms may be an emergency. Get help right away. Call 911. Do not wait to see if the symptoms will go away. Do not drive yourself to the hospital. Summary Hypertension is when the force of blood pumping through your arteries is too strong. If this condition is not controlled, it may put you at risk for serious complications. Your personal target blood pressure may vary depending on your medical conditions, your age, and other factors. For most people, a normal blood pressure is less than 120/80. Hypertension is treated with lifestyle changes, medicines, or a combination of both. Lifestyle changes include losing weight, eating a healthy,   low-sodium diet, exercising more, and limiting alcohol. This information is not intended to replace advice given to you by your health care provider. Make sure you discuss any questions you have with your health care provider. Document Revised: 11/17/2020 Document Reviewed: 11/17/2020 Elsevier Patient Education  2024 [employer]  

## 2023-01-13 ENCOUNTER — Encounter: Payer: HMO | Admitting: Physical Therapy

## 2023-01-20 ENCOUNTER — Other Ambulatory Visit: Payer: Self-pay | Admitting: Neurology

## 2023-01-20 DIAGNOSIS — G4486 Cervicogenic headache: Secondary | ICD-10-CM

## 2023-01-20 DIAGNOSIS — M542 Cervicalgia: Secondary | ICD-10-CM

## 2023-01-30 DIAGNOSIS — G4733 Obstructive sleep apnea (adult) (pediatric): Secondary | ICD-10-CM | POA: Diagnosis not present

## 2023-01-30 DIAGNOSIS — I10 Essential (primary) hypertension: Secondary | ICD-10-CM | POA: Diagnosis not present

## 2023-01-31 ENCOUNTER — Ambulatory Visit: Payer: HMO | Attending: Neurology | Admitting: Physical Therapy

## 2023-01-31 DIAGNOSIS — M6281 Muscle weakness (generalized): Secondary | ICD-10-CM | POA: Insufficient documentation

## 2023-01-31 DIAGNOSIS — R262 Difficulty in walking, not elsewhere classified: Secondary | ICD-10-CM | POA: Insufficient documentation

## 2023-01-31 DIAGNOSIS — R293 Abnormal posture: Secondary | ICD-10-CM | POA: Diagnosis not present

## 2023-01-31 DIAGNOSIS — M25562 Pain in left knee: Secondary | ICD-10-CM | POA: Insufficient documentation

## 2023-01-31 DIAGNOSIS — M542 Cervicalgia: Secondary | ICD-10-CM | POA: Insufficient documentation

## 2023-01-31 DIAGNOSIS — R252 Cramp and spasm: Secondary | ICD-10-CM | POA: Diagnosis not present

## 2023-01-31 DIAGNOSIS — M25662 Stiffness of left knee, not elsewhere classified: Secondary | ICD-10-CM | POA: Diagnosis not present

## 2023-01-31 NOTE — Therapy (Signed)
 OUTPATIENT PHYSICAL THERAPY CERVICAL EVALUATION   Patient Name: Diane Daugherty MRN: [id number] DOB:07-20-1940, 83 y.o., female Today's Date: 01/31/2023  END OF SESSION:  PT End of Session - 01/31/23 1017     Visit Number 2    Date for PT Re-Evaluation 03/08/23    Authorization Type [employer]    PT Start Time 1017    PT Stop Time 1059    PT Time Calculation (min) 42 min    Activity Tolerance Patient tolerated treatment well             Past Medical History:  Diagnosis Date   Anemia 09/24/1968   after childbirth   Arthritis    feels like all over   Carpal tunnel syndrome    Carpal tunnel syndrome    left   Cataract    immature on right   Chronic bronchitis (HCC)    get it most years (10/08/2014)   Chronic kidney disease    stage III, per Pt   Family history of anesthesia complication    daughter gets sick   History of blood transfusion    w/one of my back OR's   Hypertension    takes Hyzaar daily   Joint pain    all over   Joint swelling    Muscle spasms of lower extremity    takes Baclofen  daily prn   Nocturia    OSA on CPAP    Peripheral vascular disease (HCC)    PFO (patent foramen ovale)    positive saline bubble study 05/12/09 ([facility])   Pneumonia 2010/ 2011   PONV (postoperative nausea and vomiting)    woke up during hysterectomy   Shortness of breath    with exertion;Albuterol  prn   Urinary frequency    Past Surgical History:  Procedure Laterality Date   ABDOMINAL HYSTERECTOMY  1970's?   APPENDECTOMY     BACK SURGERY     BREAST BIOPSY Right 1990's   CESAREAN SECTION  1962; 1963; 1970   CHEST TUBE INSERTION  80's   d/t pneumothorax   COLONOSCOPY     FRACTURE SURGERY     HARDWARE REMOVAL Right 1990's   took screw out of my lower leg ~ 1 yr after putting it in   HEMILAMINOTOMY LUMBAR SPINE  02/2002   L5; decompression of the L5 and S1 nerve root; synovial cyst excision/notes 06/09/2010   KNEE ARTHROSCOPY Right ?2013    LUMBAR LAMINECTOMY/DECOMPRESSION MICRODISCECTOMY  02/2010   POSTERIOR LUMBAR FUSION  05/2003   L4-5 and S-1    TIBIA FRACTURE SURGERY Right 1990's   put screw in   TOTAL KNEE ARTHROPLASTY Right 08/28/2012   Procedure: TOTAL KNEE ARTHROPLASTY;  Surgeon: Maude LELON Right, MD;  Location: [facility] OR;  Service: Orthopedics;  Laterality: Right;   TOTAL KNEE ARTHROPLASTY Left 10/07/2014   TOTAL KNEE ARTHROPLASTY Left 10/07/2014   Procedure: TOTAL KNEE ARTHROPLASTY;  Surgeon: Maude LELON Right, MD;  Location: [facility] OR;  Service: Orthopedics;  Laterality: Left;   TOTAL KNEE REVISION Left 10/08/2021   Procedure: LEFT TOTAL KNEE REVISION;  Surgeon: Vernetta Lonni GRADE, MD;  Location: [facility] ORS;  Service: Orthopedics;  Laterality: Left;   Patient Active Problem List   Diagnosis Date Noted   Persistent headaches 12/20/2022   Neck pain 11/17/2022   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   Status post revision of total replacement of left knee 10/08/2021   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   Stage 3a  chronic kidney disease (HCC) 05/27/2021   History of multiple myeloma 05/27/2021   Primary osteoarthritis of left knee 10/07/2014   History of left knee replacement 10/07/2014   Asthma, chronic 08/30/2012   Essential hypertension 08/30/2012   Osteoarthritis of right knee 08/30/2012   OSA on CPAP 08/30/2012   Severe obesity (BMI >= 40) (HCC) 08/30/2012    PCP: Purcell Emil Schanz, MD  REFERRING PROVIDER: Leigh Venetia CROME, MD  REFERRING DIAG: M54.2 (ICD-10-CM) - Cervicalgia  THERAPY DIAG:  Cervicalgia  Cramp and spasm  Muscle weakness (generalized)  Rationale for Evaluation and Treatment: Rehabilitation  ONSET DATE: 12/29/2022  SUBJECTIVE:                                                                                                                                                                                                         SUBJECTIVE  STATEMENT: Right headache, neck is better.  The last few days have been better.     EVAL: Patient reports pain since August 24th.  She woke up with her neck stiff and it never went away.  She's also started having headaches.  She is taking a muscle relaxer and neuro prescribed something for her headaches.  She was not able to take the medication from the neuro doc.  This made her confused and nauseated.  She is retired and does just basics around the house.  Her usual housework including.  Hand dominance: Right  PERTINENT HISTORY:  Lumbar fusion several years ago  PAIN:  Are you having pain? Yes: NPRS scale: 5-6/10 Pain location: right back of the head and temple mostly Pain description: aching but occasionally sharp Aggravating factors: taking a shower, getting dressed or bending over, laying on pillow Relieving factors: rest, pillow propping  PRECAUTIONS: None  RED FLAGS: None     WEIGHT BEARING RESTRICTIONS: No  FALLS:  Has patient fallen in last 6 months? 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  DIAGNOSTIC FINDINGS:  No specif  PATIENT SURVEYS:  FOTO 50, predicted 48  COGNITION: Overall cognitive status: Within functional limits for tasks assessed  SENSATION: [facility]  POSTURE: rounded shoulders and forward head  PALPATION: Taut bands and trigger point bilateral upper traps, levators and rhomboids   CERVICAL ROM:   Active  ROM A/PROM (deg) eval  Flexion 32  Extension 40  Right lateral flexion 35  Left lateral flexion 38  Right rotation 50  Left rotation 60   (Blank rows = not tested)  UPPER EXTREMITY ROM:   UPPER EXTREMITY MMT:  Generally   CERVICAL SPECIAL TESTS:  Spurling's test: Positive and Distraction test: Positive  TODAY'S TREATMENT:      DATE: 01/31/23 Rotation SNAG with towel 10x right/left (added to HEP) Seated cervical flexion 10x Seated thoracic extension with ball 10x hands behind head (added to HEP) Red band rows 10x anchored on doorknob (given red band with handles for home) (added to HEP) Red band bil shoulder extensions 10x (added to HEP) Wall push ups 10x (added to HEP) Shrugs 3# (daughter has weights) 2 rounds of 5 reps Biceps 3# 2 rounds of 5 reps right/left Nustep seat 7 (limited knee ROM limited)  5 min L1                                                                                                                           DATE: 01/11/23 Initial eval completed and initiated HEP   PATIENT EDUCATION:  Education details: Initiated HEP Person educated: Patient Education method: Programmer, Multimedia, Facilities Manager, Verbal cues, and Handouts Education comprehension: verbalized understanding, returned demonstration, and verbal cues required  HOME EXERCISE PROGRAM: Access Code: [email or web address] URL: [email or web address] Date: 01/31/2023 Prepared by: Glade Pesa  Exercises - Shoulder Rolls in Sitting  - 1 x daily - 7 x weekly - 1 sets - 10 reps - Seated Cervical Retraction  - 1 x daily - 7 x weekly - 1 sets - 10 reps - Seated Cervical Rotation AROM  - 1 x daily - 7 x weekly - 1 sets - 10 reps - Seated Cervical Sidebending AROM  - 1 x daily - 7 x weekly - 1 sets - 10 reps - Doorway Pec Stretch at 90 Degrees Abduction  - 1 x daily - 7 x weekly - 1 sets - 10 reps - 10 sec hold - Wrist Extension Stretch at Wall  - 1 x daily - 7 x weekly - 1 sets - 10 reps - 10 sec hold -  Seated Assisted Cervical Rotation with Towel  - 1 x daily - 7 x weekly - 1 sets - 10 reps - Seated Thoracic Lumbar Extension with Pectoralis Stretch  - 1 x daily - 7 x weekly - 1 sets - 10 reps - Standing Bilateral Low Shoulder Row with Anchored Resistance  - 1 x daily - 7 x weekly - 1 sets - 10 reps - Shoulder extension with resistance - Neutral  - 1 x daily - 7 x weekly - 1 sets - 10 reps - Wall Push Up  - 1 x daily - 7 x weekly - 1 sets -  10 reps  ASSESSMENT:  CLINICAL IMPRESSION: Therapist progressing and updating HEP for increased mobility and pain reduction.  Therapist providing verbal cues to optimize technique with  exercises in order to achieve the greatest benefit. Therapist monitoring response  with patient reporting no increase in pain and maybe lessening of pain at the end of session.       OBJECTIVE IMPAIRMENTS: decreased ROM, decreased strength, increased fascial restrictions, increased muscle spasms, impaired flexibility, impaired UE functional use, postural dysfunction, and pain.   ACTIVITY LIMITATIONS: carrying, lifting, sleeping, transfers, bed mobility, bathing, toileting, dressing, reach over head, hygiene/grooming, and caring for others  PARTICIPATION LIMITATIONS: meal prep, cleaning, laundry, driving, shopping, community activity, and church  PERSONAL FACTORS: Age, Fitness, and 1-2 comorbidities: CKD and chronic bronchitis  are also affecting patient's functional outcome.   REHAB POTENTIAL: Good  CLINICAL DECISION MAKING: Stable/uncomplicated  EVALUATION COMPLEXITY: Low   GOALS: Goals reviewed with patient? Yes  SHORT TERM GOALS: Target date: 02/08/2023   Pain report to be no greater than 4/10  Baseline:  Goal status: INITIAL  2.  Patient will be independent with initial HEP  Baseline:  Goal status: INITIAL  3.  Patient to be able to reach back of head for grooming with bilateral shoulders Baseline:  Goal status: INITIAL  Goal status:  INITIAL  LONG TERM GOALS: Target date: 03/08/2023  Patient to report pain no greater than 2/10  Baseline:  Goal status: INITIAL  2.  Patient will be able to reach overhead into cabinets and on top of shelves without pain  Baseline:  Goal status: INITIAL  3.  Patient to be able to sleep through the night  Baseline:  Goal status: INITIAL  4.  Patient to be independent with advanced HEP  Baseline:  Goal status: INITIAL  5.  Patient to report 85% improvement in overall symptoms  Baseline:  Goal status: INITIAL  6.  Patient to be able to do all ADL's and IADL's without exacerbation of shoulder and neck pain Baseline:  Goal status: INITIAL   PLAN:  PT FREQUENCY: 1-2x/week  PT DURATION: 8 weeks  PLANNED INTERVENTIONS: 97110-Therapeutic exercises, 97530- Therapeutic activity, [id number]- Neuromuscular re-education, 97535- Self Care, [id number]- Manual therapy, [id number]- Gait training, [phone]- Aquatic Therapy, [id number]- Electrical stimulation (unattended), [id number]- Electrical stimulation (manual), [id number]- Ultrasound, [id number]- Traction (mechanical), [id number]- Ionotophoresis 4mg /ml Dexamethasone , Patient/Family education, Taping, Dry Needling, Spinal mobilization, Scar mobilization, Cryotherapy, and Moist heat  PLAN FOR NEXT SESSION: Review HEP, Nustep, manual techniques, pt reports lying supine increases LBP (prefers seated or standing)  Glade Pesa, PT 01/31/23 1:41 PM Phone: [phone] Fax: [phone]

## 2023-02-02 ENCOUNTER — Ambulatory Visit: Payer: HMO | Admitting: Physical Therapy

## 2023-02-02 DIAGNOSIS — M542 Cervicalgia: Secondary | ICD-10-CM

## 2023-02-02 DIAGNOSIS — R252 Cramp and spasm: Secondary | ICD-10-CM

## 2023-02-02 DIAGNOSIS — M6281 Muscle weakness (generalized): Secondary | ICD-10-CM

## 2023-02-02 NOTE — Therapy (Signed)
 OUTPATIENT PHYSICAL THERAPY CERVICAL PROGRESS NOTE   Patient Name: Diane Daugherty MRN: [id number] DOB:[birth year], 83 y.o., female Today's Date: 02/02/2023  END OF SESSION:  PT End of Session - 02/02/23 1018     Visit Number 3    Date for PT Re-Evaluation 03/08/23    Authorization Type [employer]    PT Start Time 1016    PT Stop Time 1055    PT Time Calculation (min) 39 min    Activity Tolerance Patient tolerated treatment well             Past Medical History:  Diagnosis Date   Anemia 09/24/1968   after childbirth   Arthritis    feels like all over   Carpal tunnel syndrome    Carpal tunnel syndrome    left   Cataract    immature on right   Chronic bronchitis (HCC)    get it most years (10/08/2014)   Chronic kidney disease    stage III, per Pt   Family history of anesthesia complication    daughter gets sick   History of blood transfusion    w/one of my back OR's   Hypertension    takes Hyzaar daily   Joint pain    all over   Joint swelling    Muscle spasms of lower extremity    takes Baclofen  daily prn   Nocturia    OSA on CPAP    Peripheral vascular disease (HCC)    PFO (patent foramen ovale)    positive saline bubble study 05/12/09 ([facility])   Pneumonia 2010/ 2011   PONV (postoperative nausea and vomiting)    woke up during hysterectomy   Shortness of breath    with exertion;Albuterol  prn   Urinary frequency    Past Surgical History:  Procedure Laterality Date   ABDOMINAL HYSTERECTOMY  1970's?   APPENDECTOMY     BACK SURGERY     BREAST BIOPSY Right 1990's   CESAREAN SECTION  1962; 1963; 1970   CHEST TUBE INSERTION  80's   d/t pneumothorax   COLONOSCOPY     FRACTURE SURGERY     HARDWARE REMOVAL Right 1990's   took screw out of my lower leg ~ 1 yr after putting it in   HEMILAMINOTOMY LUMBAR SPINE  02/2002   L5; decompression of the L5 and S1 nerve root; synovial cyst excision/notes 06/09/2010   KNEE ARTHROSCOPY Right ?2013    LUMBAR LAMINECTOMY/DECOMPRESSION MICRODISCECTOMY  02/2010   POSTERIOR LUMBAR FUSION  05/2003   L4-5 and S-1    TIBIA FRACTURE SURGERY Right 1990's   put screw in   TOTAL KNEE ARTHROPLASTY Right 08/28/2012   Procedure: TOTAL KNEE ARTHROPLASTY;  Surgeon: Maude LELON Right, MD;  Location: [facility] OR;  Service: Orthopedics;  Laterality: Right;   TOTAL KNEE ARTHROPLASTY Left 10/07/2014   TOTAL KNEE ARTHROPLASTY Left 10/07/2014   Procedure: TOTAL KNEE ARTHROPLASTY;  Surgeon: Maude LELON Right, MD;  Location: [facility] OR;  Service: Orthopedics;  Laterality: Left;   TOTAL KNEE REVISION Left 10/08/2021   Procedure: LEFT TOTAL KNEE REVISION;  Surgeon: Vernetta Lonni GRADE, MD;  Location: [facility] ORS;  Service: Orthopedics;  Laterality: Left;   Patient Active Problem List   Diagnosis Date Noted   Persistent headaches 12/20/2022   Neck pain 11/17/2022   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   Status post revision of total replacement of left knee 10/08/2021   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   Stage  3a chronic kidney disease (HCC) 05/27/2021   History of multiple myeloma 05/27/2021   Primary osteoarthritis of left knee 10/07/2014   History of left knee replacement 10/07/2014   Asthma, chronic 08/30/2012   Essential hypertension 08/30/2012   Osteoarthritis of right knee 08/30/2012   OSA on CPAP 08/30/2012   Severe obesity (BMI >= 40) (HCC) 08/30/2012    PCP: Purcell Emil Schanz, MD  REFERRING PROVIDER: Leigh Venetia CROME, MD  REFERRING DIAG: M54.2 (ICD-10-CM) - Cervicalgia  THERAPY DIAG:  Cervicalgia  Cramp and spasm  Muscle weakness (generalized)  Rationale for Evaluation and Treatment: Rehabilitation  ONSET DATE: 12/29/2022  SUBJECTIVE:                                                                                                                                                                                                         SUBJECTIVE  STATEMENT: I woke up with a headache.  It's not constant.  When I sit still I don't hurt.  But if I look down or move around.  Sleep on 2 soft pillows.  It hurts the most in the shower and getting dressed.       EVAL: Patient reports pain since August 24th.  She woke up with her neck stiff and it never went away.  She's also started having headaches.  She is taking a muscle relaxer and neuro prescribed something for her headaches.  She was not able to take the medication from the neuro doc.  This made her confused and nauseated.  She is retired and does just basics around the house.  Her usual housework including.  Hand dominance: Right  PERTINENT HISTORY:  Lumbar fusion several years ago  PAIN:  Are you having pain? Yes: NPRS scale: 6/10 Pain location: right back of the head and temple mostly Pain description: aching but occasionally sharp Aggravating factors: taking a shower, getting dressed or bending over, laying on pillow Relieving factors: rest, pillow propping  PRECAUTIONS: None  RED FLAGS: None     WEIGHT BEARING RESTRICTIONS: No  FALLS:  Has patient fallen in last 6 months? 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  DIAGNOSTIC FINDINGS:  No specif  PATIENT SURVEYS:  FOTO 50, predicted 101  COGNITION: Overall cognitive status: Within functional limits for tasks assessed  SENSATION: WFL  POSTURE: rounded shoulders and  forward head  PALPATION: Taut bands and trigger point bilateral upper traps, levators and rhomboids   CERVICAL ROM:   Active ROM A/PROM (deg) eval  Flexion 32  Extension 40  Right lateral flexion 35  Left lateral flexion 38  Right rotation 50  Left rotation 60   (Blank rows = not tested)  UPPER EXTREMITY ROM:   UPPER EXTREMITY MMT:  Generally   CERVICAL SPECIAL TESTS:  Spurling's test: Positive and Distraction test: Positive  TODAY'S TREATMENT:      DATE: 02/02/23 Discussed sleeping option of wedge and 1 pillow vs 2 pillows (if often waking with headache) Rotation SNAG with towel 10x right/left  Seated cervical flexion with self overpressure 10x Seated thoracic extension with ball 10x hands behind head (arms get tired) Seated foam roll behind head: rotation and nods 10x each Red band rows 10x anchored on doorknob 10x Red band row and rotate head 5x right/left Red band bil shoulder extensions 5x; single arm 5x right/left  Wall push ups 5x; angled push ups 5x Shrugs 2# (daughter has 2# weights) 2 rounds of 5 reps Biceps 2# 2 rounds of 5 reps right/left 2# overhead punches 2 rounds 5 reps Nustep seat 8 green machine (limited knee ROM limited)  6 min L6  DATE: 01/31/23 Rotation SNAG with towel 10x right/left (added to HEP) Seated cervical flexion 10x Seated thoracic extension with ball 10x hands behind head (added to HEP) Red band rows 10x anchored on doorknob (given red band with handles for home) (added to HEP) Red band bil shoulder extensions 10x (added to HEP) Wall push ups 10x (added to HEP) Shrugs 3# (daughter has weights) 2 rounds of 5 reps Biceps 3# 2 rounds of 5 reps right/left Nustep seat 7 (limited knee ROM limited)  5 min L1                                                                                                                           DATE: 01/11/23 Initial eval completed and initiated HEP   PATIENT EDUCATION:  Education details: Initiated  HEP Person educated: Patient Education method: Programmer, Multimedia, Facilities Manager, Verbal cues, and Handouts Education comprehension: verbalized understanding, returned demonstration, and verbal cues required  HOME EXERCISE PROGRAM: Access Code: [email or web address] URL: [email or web address] Date: 01/31/2023 Prepared by: Glade Pesa  Exercises - Shoulder Rolls in Sitting  - 1 x daily - 7 x weekly - 1 sets - 10 reps - Seated Cervical Retraction  - 1 x daily - 7 x weekly - 1 sets - 10 reps - Seated Cervical Rotation AROM  - 1 x daily - 7 x weekly - 1 sets - 10 reps - Seated Cervical Sidebending AROM  - 1 x daily - 7 x weekly - 1  sets - 10 reps - Doorway Pec Stretch at 90 Degrees Abduction  - 1 x daily - 7 x weekly - 1 sets - 10 reps - 10 sec hold - Wrist Extension Stretch at Wall  - 1 x daily - 7 x weekly - 1 sets - 10 reps - 10 sec hold - Seated Assisted Cervical Rotation with Towel  - 1 x daily - 7 x weekly - 1 sets - 10 reps - Seated Thoracic Lumbar Extension with Pectoralis Stretch  - 1 x daily - 7 x weekly - 1 sets - 10 reps - Standing Bilateral Low Shoulder Row with Anchored Resistance  - 1 x daily - 7 x weekly - 1 sets - 10 reps - Shoulder extension with resistance - Neutral  - 1 x daily - 7 x weekly - 1 sets - 10 reps - Wall Push Up  - 1 x daily - 7 x weekly - 1 sets - 10 reps  ASSESSMENT:  CLINICAL IMPRESSION: Arrives with a headache/neck pain but is able to perform cervical mobility and upper quarter exercises with an improvement in symptoms.  She reports at the end of session her headache is gone.  Therapist monitoring response throughout session and modifying ex's including adding rotational movements to affect upper cervical spine.  OBJECTIVE IMPAIRMENTS: decreased ROM, decreased strength, increased fascial restrictions, increased muscle spasms, impaired flexibility, impaired UE functional use, postural dysfunction, and pain.   ACTIVITY LIMITATIONS: carrying, lifting, sleeping,  transfers, bed mobility, bathing, toileting, dressing, reach over head, hygiene/grooming, and caring for others  PARTICIPATION LIMITATIONS: meal prep, cleaning, laundry, driving, shopping, community activity, and church  PERSONAL FACTORS: Age, Fitness, and 1-2 comorbidities: CKD and chronic bronchitis  are also affecting patient's functional outcome.   REHAB POTENTIAL: Good  CLINICAL DECISION MAKING: Stable/uncomplicated  EVALUATION COMPLEXITY: Low   GOALS: Goals reviewed with patient? Yes  SHORT TERM GOALS: Target date: 02/08/2023   Pain report to be no greater than 4/10  Baseline:  Goal status: INITIAL  2.  Patient will be independent with initial HEP  Baseline:  Goal status: INITIAL  3.  Patient to be able to reach back of head for grooming with bilateral shoulders Baseline:  Goal status: INITIAL  Goal status: INITIAL  LONG TERM GOALS: Target date: 03/08/2023  Patient to report pain no greater than 2/10  Baseline:  Goal status: INITIAL  2.  Patient will be able to reach overhead into cabinets and on top of shelves without pain  Baseline:  Goal status: INITIAL  3.  Patient to be able to sleep through the night  Baseline:  Goal status: INITIAL  4.  Patient to be independent with advanced HEP  Baseline:  Goal status: INITIAL  5.  Patient to report 85% improvement in overall symptoms  Baseline:  Goal status: INITIAL  6.  Patient to be able to do all ADL's and IADL's without exacerbation of shoulder and neck pain Baseline:  Goal status: INITIAL   PLAN:  PT FREQUENCY: 1-2x/week  PT DURATION: 8 weeks  PLANNED INTERVENTIONS: 97110-Therapeutic exercises, 97530- Therapeutic activity, [id number]- Neuromuscular re-education, 97535- Self Care, 02859- Manual therapy, [id number]- Gait training, [id number]- Aquatic Therapy, [id number]- Electrical stimulation (unattended), [id number]- Electrical stimulation (manual), [id number]- Ultrasound, [id number]- Traction (mechanical), [id number]- Ionotophoresis  4mg /ml Dexamethasone , Patient/Family education, Taping, Dry Needling, Spinal mobilization, Scar mobilization, Cryotherapy, and Moist heat  PLAN FOR NEXT SESSION: recheck ROM;  Review HEP, Nustep, manual techniques, pt reports lying supine increases LBP (prefers seated or standing)  Shelton Soler  Desmond Szabo, PT 02/02/23 10:59 AM Phone: [phone] Fax: [phone]

## 2023-02-07 ENCOUNTER — Ambulatory Visit: Payer: HMO | Admitting: Physical Therapy

## 2023-02-07 DIAGNOSIS — M542 Cervicalgia: Secondary | ICD-10-CM

## 2023-02-07 DIAGNOSIS — M6281 Muscle weakness (generalized): Secondary | ICD-10-CM

## 2023-02-07 DIAGNOSIS — R252 Cramp and spasm: Secondary | ICD-10-CM

## 2023-02-07 NOTE — Therapy (Signed)
 OUTPATIENT PHYSICAL THERAPY CERVICAL PROGRESS NOTE   Patient Name: Diane Daugherty MRN: [id number] DOB:[birth year], 83 y.o., female Today's Date: 02/07/2023  END OF SESSION:  PT End of Session - 02/07/23 1017     Visit Number 4    Date for PT Re-Evaluation 03/08/23    Authorization Type [employer]    PT Start Time 1017    PT Stop Time 1057    PT Time Calculation (min) 40 min    Activity Tolerance Patient tolerated treatment well             Past Medical History:  Diagnosis Date   Anemia 09/24/1968   after childbirth   Arthritis    feels like all over   Carpal tunnel syndrome    Carpal tunnel syndrome    left   Cataract    immature on right   Chronic bronchitis (HCC)    get it most years (10/08/2014)   Chronic kidney disease    stage III, per Pt   Family history of anesthesia complication    daughter gets sick   History of blood transfusion    w/one of my back OR's   Hypertension    takes Hyzaar daily   Joint pain    all over   Joint swelling    Muscle spasms of lower extremity    takes Baclofen  daily prn   Nocturia    OSA on CPAP    Peripheral vascular disease (HCC)    PFO (patent foramen ovale)    positive saline bubble study 05/12/09 ([facility])   Pneumonia 2010/ 2011   PONV (postoperative nausea and vomiting)    woke up during hysterectomy   Shortness of breath    with exertion;Albuterol  prn   Urinary frequency    Past Surgical History:  Procedure Laterality Date   ABDOMINAL HYSTERECTOMY  1970's?   APPENDECTOMY     BACK SURGERY     BREAST BIOPSY Right 1990's   CESAREAN SECTION  1962; 1963; 1970   CHEST TUBE INSERTION  80's   d/t pneumothorax   COLONOSCOPY     FRACTURE SURGERY     HARDWARE REMOVAL Right 1990's   took screw out of my lower leg ~ 1 yr after putting it in   HEMILAMINOTOMY LUMBAR SPINE  02/2002   L5; decompression of the L5 and S1 nerve root; synovial cyst excision/notes 06/09/2010   KNEE ARTHROSCOPY Right ?2013    LUMBAR LAMINECTOMY/DECOMPRESSION MICRODISCECTOMY  02/2010   POSTERIOR LUMBAR FUSION  05/2003   L4-5 and S-1    TIBIA FRACTURE SURGERY Right 1990's   put screw in   TOTAL KNEE ARTHROPLASTY Right 08/28/2012   Procedure: TOTAL KNEE ARTHROPLASTY;  Surgeon: Maude LELON Right, MD;  Location: [facility] OR;  Service: Orthopedics;  Laterality: Right;   TOTAL KNEE ARTHROPLASTY Left 10/07/2014   TOTAL KNEE ARTHROPLASTY Left 10/07/2014   Procedure: TOTAL KNEE ARTHROPLASTY;  Surgeon: Maude LELON Right, MD;  Location: [facility] OR;  Service: Orthopedics;  Laterality: Left;   TOTAL KNEE REVISION Left 10/08/2021   Procedure: LEFT TOTAL KNEE REVISION;  Surgeon: Vernetta Lonni GRADE, MD;  Location: [facility] ORS;  Service: Orthopedics;  Laterality: Left;   Patient Active Problem List   Diagnosis Date Noted   Persistent headaches 12/20/2022   Neck pain 11/17/2022   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   Status post revision of total replacement of left knee 10/08/2021   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   Stage  3a chronic kidney disease (HCC) 05/27/2021   History of multiple myeloma 05/27/2021   Primary osteoarthritis of left knee 10/07/2014   History of left knee replacement 10/07/2014   Asthma, chronic 08/30/2012   Essential hypertension 08/30/2012   Osteoarthritis of right knee 08/30/2012   OSA on CPAP 08/30/2012   Severe obesity (BMI >= 40) (HCC) 08/30/2012    PCP: Purcell Emil Schanz, MD  REFERRING PROVIDER: Leigh Venetia CROME, MD  REFERRING DIAG: M54.2 (ICD-10-CM) - Cervicalgia  THERAPY DIAG:  Cervicalgia  Cramp and spasm  Muscle weakness (generalized)  Rationale for Evaluation and Treatment: Rehabilitation  ONSET DATE: 12/29/2022  SUBJECTIVE:                                                                                                                                                                                                         SUBJECTIVE  STATEMENT: Mild headache comes in waves every once in a while.  This is the first one I've had since I was here last though!  I did try the wedge + 1 pillow but it was kind of uncomfortable.     EVAL: Patient reports pain since August 24th.  She woke up with her neck stiff and it never went away.  She's also started having headaches.  She is taking a muscle relaxer and neuro prescribed something for her headaches.  She was not able to take the medication from the neuro doc.  This made her confused and nauseated.  She is retired and does just basics around the house.  Her usual housework including.  Hand dominance: Right  PERTINENT HISTORY:  Lumbar fusion several years ago  PAIN:  Are you having pain? Yes: NPRS scale: 0/10 Pain location: right back of head and temple but not present at the moment Pain description: aching but occasionally sharp Aggravating factors: taking a shower, getting dressed or bending over, laying on pillow Relieving factors: rest, pillow propping  PRECAUTIONS: None  RED FLAGS: None     WEIGHT BEARING RESTRICTIONS: No  FALLS:  Has patient fallen in last 6 months? 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  DIAGNOSTIC FINDINGS:  No specif  PATIENT SURVEYS:  FOTO 50, predicted 10  COGNITION: Overall cognitive status: Within functional limits for tasks assessed  SENSATION: WFL  POSTURE: rounded shoulders and  forward head  PALPATION: Taut bands and trigger point bilateral upper traps, levators and rhomboids   CERVICAL ROM:   Active ROM A/PROM (deg) eval 1/14  Flexion 32 62  Extension 40 41  Right lateral flexion 35 40  Left lateral flexion 38 46  Right rotation 50 65  Left rotation 60 65   (Blank rows = not tested)  UPPER EXTREMITY ROM:  1/14: able to touch back of the head easily with both hands (arms get tired with sustained hold)   UPPER EXTREMITY MMT:  Generally   CERVICAL SPECIAL TESTS:  Spurling's test: Positive and Distraction test: Positive  TODAY'S TREATMENT:    DATE: 02/07/23 Nustep seat 7, arms 8: green machine (limited knee ROM limited)  10 min L5   Cervical towel roll for sleeping Seated 4# plyoball core series: hip to hip, shoulder to hip, Vs, ear to ear 8x each Seated pink power cord dead lifts (cord anchored under feet) 10x (verbal cue to sit up tall) Wall push ups 5x; angled push ups 5x Back to the wall posture ex's:  inhale with bil arm elevation/exhale as you lower 5x; snow angel with inhale on elevation, lower with exhale 5x Back to the wall isometric head push on pillow and backs of hands into the wall 5 sec hold 5x Back to the wall using cane shoulder horizontal abduction with head rotation 5x     DATE: 02/02/23 Discussed sleeping option of wedge and 1 pillow vs 2 pillows (if often waking with headache) Rotation SNAG with towel 10x right/left  Seated cervical flexion with self overpressure 10x Seated thoracic extension with ball 10x hands behind head (arms get tired) Seated foam roll behind head: rotation and nods 10x each Red band rows 10x anchored on doorknob 10x Red band row and rotate head 5x right/left Red band bil shoulder extensions 5x; single arm 5x right/left  Wall push ups 5x; angled push ups 5x Shrugs 2# (daughter has 2# weights) 2 rounds of 5 reps Biceps 2# 2 rounds of 5 reps right/left 2# overhead punches 2 rounds 5 reps Nustep seat 8  green machine (limited knee ROM limited)  6 min L6  DATE: 01/31/23 Rotation SNAG with towel 10x right/left (added to HEP) Seated cervical flexion 10x Seated thoracic extension with ball 10x hands behind head (added to HEP) Red band rows 10x anchored on doorknob (given red band with handles for home) (added to HEP) Red band bil shoulder extensions 10x (added to HEP) Wall push ups 10x (added to HEP) Shrugs 3# (daughter has weights) 2 rounds of 5 reps Biceps 3# 2 rounds of 5 reps right/left Nustep seat 7 (limited knee ROM limited)  5 min L1  DATE: 01/11/23 Initial eval completed and initiated HEP   PATIENT EDUCATION:  Education details: Initiated HEP Person educated: Patient Education method: Programmer, Multimedia, Facilities Manager, Verbal cues, and Handouts Education comprehension: verbalized understanding, returned demonstration, and verbal cues required  HOME EXERCISE PROGRAM: Access Code: [email or web address] URL: [email or web address] Date: 01/31/2023 Prepared by: Glade Pesa  Exercises - Shoulder Rolls in Sitting  - 1 x daily - 7 x weekly - 1 sets - 10 reps - Seated Cervical Retraction  - 1 x daily - 7 x weekly - 1 sets - 10 reps - Seated Cervical Rotation AROM  - 1 x daily - 7 x weekly - 1 sets - 10 reps - Seated Cervical Sidebending AROM  - 1 x daily - 7 x weekly - 1 sets - 10 reps - Doorway Pec Stretch at 90 Degrees Abduction  - 1 x daily - 7 x weekly - 1 sets - 10 reps - 10 sec hold - Wrist Extension Stretch at Wall  - 1 x daily - 7 x weekly - 1 sets - 10 reps - 10 sec hold - Seated Assisted Cervical Rotation with Towel  - 1 x daily - 7 x weekly - 1 sets - 10 reps - Seated Thoracic Lumbar Extension with Pectoralis Stretch  - 1 x daily - 7 x weekly - 1 sets - 10 reps - Standing Bilateral Low Shoulder Row with Anchored Resistance  - 1 x daily - 7 x weekly - 1 sets -  10 reps - Shoulder extension with resistance - Neutral  - 1 x daily - 7 x weekly - 1 sets - 10 reps - Wall Push Up  - 1 x daily - 7 x weekly - 1 sets - 10 reps  ASSESSMENT:  CLINICAL IMPRESSION: Although she reports intermittent headache this morning, she denies headache or neck pain during or at the conclusion of the session. We have discussed sleeping positions since that seems to trigger the onset of headaches.  She demonstrates excellent compliance with HEP and is using a red band and 2 pound weights at home.  Able to progress ex to include additional postural strengthening.  Therapist providing verbal cues to optimize technique with  exercises in order to achieve the greatest benefit.   OBJECTIVE IMPAIRMENTS: decreased ROM, decreased strength, increased fascial restrictions, increased muscle spasms, impaired flexibility, impaired UE functional use, postural dysfunction, and pain.   ACTIVITY LIMITATIONS: carrying, lifting, sleeping, transfers, bed mobility, bathing, toileting, dressing, reach over head, hygiene/grooming, and caring for others  PARTICIPATION LIMITATIONS: meal prep, cleaning, laundry, driving, shopping, community activity, and church  PERSONAL FACTORS: Age, Fitness, and 1-2 comorbidities: CKD and chronic bronchitis  are also affecting patient's functional outcome.   REHAB POTENTIAL: Good  CLINICAL DECISION MAKING: Stable/uncomplicated  EVALUATION COMPLEXITY: Low   GOALS: Goals reviewed with patient? Yes  SHORT TERM GOALS: Target date: 02/08/2023   Pain report to be no greater than 4/10  Baseline:  Goal status: ongoing  2.  Patient will be independent with initial HEP  Baseline:  Goal status: met 1/14  3.  Patient to be able to reach back of head for grooming with bilateral shoulders Baseline:  Goal status: met 1/14   LONG TERM GOALS: Target date: 03/08/2023  Patient to report pain no greater than 2/10  Baseline:  Goal status: INITIAL  2.  Patient  will be able to reach overhead into cabinets and on top of shelves without pain  Baseline:  Goal status: INITIAL  3.  Patient to  be able to sleep through the night  Baseline:  Goal status: INITIAL  4.  Patient to be independent with advanced HEP  Baseline:  Goal status: INITIAL  5.  Patient to report 85% improvement in overall symptoms  Baseline:  Goal status: INITIAL  6.  Patient to be able to do all ADL's and IADL's without exacerbation of shoulder and neck pain Baseline:  Goal status: INITIAL   PLAN:  PT FREQUENCY: 1-2x/week  PT DURATION: 8 weeks  PLANNED INTERVENTIONS: 97110-Therapeutic exercises, 97530- Therapeutic activity, [id number]- Neuromuscular re-education, 97535- Self Care, [id number]- Manual therapy, [id number]- Gait training, [phone]- Aquatic Therapy, [id number]- Electrical stimulation (unattended), [id number]- Electrical stimulation (manual), [id number]- Ultrasound, [id number]- Traction (mechanical), [id number]- Ionotophoresis 4mg /ml Dexamethasone , Patient/Family education, Taping, Dry Needling, Spinal mobilization, Scar mobilization, Cryotherapy, and Moist heat  PLAN FOR NEXT SESSION:   HEP progression has 2# weights at home and red band with handles, Nustep, manual techniques, pt reports lying supine increases LBP (prefers seated or standing)  Glade Pesa, PT 02/07/23 4:24 PM Phone: [phone] Fax: [phone]

## 2023-02-09 ENCOUNTER — Ambulatory Visit: Payer: HMO

## 2023-02-09 DIAGNOSIS — M6281 Muscle weakness (generalized): Secondary | ICD-10-CM

## 2023-02-09 DIAGNOSIS — R293 Abnormal posture: Secondary | ICD-10-CM

## 2023-02-09 DIAGNOSIS — R252 Cramp and spasm: Secondary | ICD-10-CM

## 2023-02-09 DIAGNOSIS — M542 Cervicalgia: Secondary | ICD-10-CM

## 2023-02-09 NOTE — Therapy (Signed)
OUTPATIENT PHYSICAL THERAPY CERVICAL TREATMENT NOTE   Patient Name: Diane Daugherty MRN: [id number] DOB:May 05, 1940, 83 y.o., female Today's Date: 02/09/2023  END OF SESSION:  PT End of Session - 02/09/23 1021     Visit Number 5    Date for PT Re-Evaluation 03/08/23    Authorization Type [employer]    PT Start Time 1021    PT Stop Time 1100    PT Time Calculation (min) 39 min    Activity Tolerance Patient tolerated treatment well    Behavior During Therapy WFL for tasks assessed/performed             Past Medical History:  Diagnosis Date   Anemia 09/24/1968   "after childbirth"   Arthritis    "feels like all over"   Carpal tunnel syndrome    Carpal tunnel syndrome    left   Cataract    immature on right   Chronic bronchitis (HCC)    "get it most years" (10/08/2014)   Chronic kidney disease    stage III, per Pt   Family history of anesthesia complication    daughter gets sick   History of blood transfusion    "w/one of my back OR's"   Hypertension    takes Hyzaar daily   Joint pain    "all over"   Joint swelling    Muscle spasms of lower extremity    takes Baclofen daily prn   Nocturia    OSA on CPAP    Peripheral vascular disease (HCC)    PFO (patent foramen ovale)    positive saline bubble study 05/12/09 ([facility])   Pneumonia 2010/ 2011   PONV (postoperative nausea and vomiting)    woke up during hysterectomy   Shortness of breath    with exertion;Albuterol prn   Urinary frequency    Past Surgical History:  Procedure Laterality Date   ABDOMINAL HYSTERECTOMY  1970's?   APPENDECTOMY     BACK SURGERY     BREAST BIOPSY Right 1990's   CESAREAN SECTION  1962; 1963; 1970   CHEST TUBE INSERTION  80's   d/t pneumothorax   COLONOSCOPY     FRACTURE SURGERY     HARDWARE REMOVAL Right 1990's   "took screw out of my lower leg ~ 1 yr after putting it in"   HEMILAMINOTOMY LUMBAR SPINE  02/2002   L5; decompression of the L5 and S1 nerve root; synovial  cyst excision/notes 06/09/2010   KNEE ARTHROSCOPY Right ?2013   LUMBAR LAMINECTOMY/DECOMPRESSION MICRODISCECTOMY  02/2010   POSTERIOR LUMBAR FUSION  05/2003   L4-5 and S-1    TIBIA FRACTURE SURGERY Right 1990's   "put screw in"   TOTAL KNEE ARTHROPLASTY Right 08/28/2012   Procedure: TOTAL KNEE ARTHROPLASTY;  Surgeon: Valeria Batman, MD;  Location: [facility] OR;  Service: Orthopedics;  Laterality: Right;   TOTAL KNEE ARTHROPLASTY Left 10/07/2014   TOTAL KNEE ARTHROPLASTY Left 10/07/2014   Procedure: TOTAL KNEE ARTHROPLASTY;  Surgeon: Valeria Batman, MD;  Location: [facility] OR;  Service: Orthopedics;  Laterality: Left;   TOTAL KNEE REVISION Left 10/08/2021   Procedure: LEFT TOTAL KNEE REVISION;  Surgeon: Kathryne Hitch, MD;  Location: [facility] ORS;  Service: Orthopedics;  Laterality: Left;   Patient Active Problem List   Diagnosis Date Noted   Persistent headaches 12/20/2022   Neck pain 11/17/2022   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   Status post revision of total replacement of left knee 10/08/2021   Failed total left knee replacement (HCC)  10/07/2021   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   Stage 3a chronic kidney disease (HCC) 05/27/2021   History of multiple myeloma 05/27/2021   Primary osteoarthritis of left knee 10/07/2014   History of left knee replacement 10/07/2014   Asthma, chronic 08/30/2012   Essential hypertension 08/30/2012   Osteoarthritis of right knee 08/30/2012   OSA on CPAP 08/30/2012   Severe obesity (BMI >= 40) (HCC) 08/30/2012    PCP: [name], MD  REFERRING PROVIDER: [name], MD  REFERRING DIAG: M54.2 (ICD-10-CM) - Cervicalgia  THERAPY DIAG:  Cervicalgia  Cramp and spasm  Muscle weakness (generalized)  Abnormal posture  Rationale for Evaluation and Treatment: Rehabilitation  ONSET DATE: 12/29/2022  SUBJECTIVE:                                                                                                                                                                                                          SUBJECTIVE STATEMENT: Mild headache from not sleeping well.  Our alarm system malfunctioned several times through the night.  "My neck is getting better"  "The pain comes in waves.  It's not bad but every now and then it goes up".   Pain reported at 5/10 today.     EVAL: Patient reports pain since August 24th.  She woke up with her neck stiff and it never went away.  She's also started having headaches.  She is taking a muscle relaxer and neuro prescribed something for her headaches.  She was not able to take the medication from the neuro doc.  This made her confused and nauseated.  She is retired and does just basics around the house.  Her usual housework including.  Hand dominance: Right  PERTINENT HISTORY:  Lumbar fusion several years ago  PAIN:  Are you having pain? Yes: NPRS scale: 0/10 Pain location: right back of head and temple but not present at the moment Pain description: aching but occasionally sharp Aggravating factors: taking a shower, getting dressed or bending over, laying on pillow Relieving factors: rest, pillow propping  PRECAUTIONS: None  RED FLAGS: None     WEIGHT BEARING RESTRICTIONS: No  FALLS:  Has patient fallen in last 6 months? 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  DIAGNOSTIC FINDINGS:  No specif  PATIENT SURVEYS:  FOTO 50, predicted  36  COGNITION: Overall cognitive status: Within functional limits for tasks assessed  SENSATION: WFL  POSTURE: rounded shoulders and forward head  PALPATION: Taut bands and trigger point bilateral upper traps, levators and rhomboids   CERVICAL ROM:   Active ROM A/PROM (deg) eval 1/14  Flexion 32 62  Extension 40 41  Right lateral flexion 35 40  Left lateral flexion 38 46  Right rotation 50 65  Left rotation 60 65   (Blank rows = not tested)  UPPER EXTREMITY ROM:  1/14: able to touch back of the head easily with both hands (arms get tired with sustained hold)   UPPER EXTREMITY MMT:  Generally   CERVICAL SPECIAL TESTS:  Spurling's test: Positive and Distraction test: Positive  TODAY'S TREATMENT:    DATE: 02/09/23 Nustep seat 7, arms 8: green machine (limited knee ROM limited)  10 min L5   MELT techniques: with blue foam roller : rotation, nods, rotated to right figure 8's, rotated left figure 8's x 20 each (10 each side) Manual: PROM to Cspine, upper trap stretch, levator stretch, STM C spine, upper traps and levator, manual traction  DATE: 02/07/23 Nustep seat 7, arms 8: green machine (limited knee ROM limited)  10 min L5   Cervical towel roll for sleeping Seated 4# plyoball core series: hip to hip, shoulder to hip, Vs, ear to ear 8x each Seated pink power cord dead lifts (cord anchored under feet) 10x (verbal cue to sit up tall) Wall push ups 5x; angled push ups 5x Back to the wall posture ex's:  inhale with bil arm elevation/exhale as you lower 5x; snow angel with inhale on elevation, lower with exhale 5x Back to the wall isometric head push on pillow and backs of hands into the wall 5 sec hold 5x Back to the wall using cane shoulder horizontal abduction with head rotation 5x     DATE: 02/02/23 Discussed sleeping option of wedge and 1 pillow vs 2 pillows (if often waking with headache) Rotation SNAG with towel 10x right/left  Seated cervical flexion with self  overpressure 10x Seated thoracic extension with ball 10x hands behind head (arms get tired) Seated foam roll behind head: rotation and nods 10x each Red band rows 10x anchored on doorknob 10x Red band row and rotate head 5x right/left Red band bil shoulder extensions 5x; single arm 5x right/left  Wall push ups 5x; angled push ups 5x Shrugs 2# (daughter has 2# weights) 2 rounds of 5 reps Biceps 2# 2 rounds of 5 reps right/left 2# overhead punches 2 rounds 5 reps Nustep seat 8 green machine (limited knee ROM limited)  6 min L6  DATE: 01/31/23 Rotation SNAG with towel 10x right/left (added to HEP) Seated cervical flexion 10x Seated thoracic extension with ball 10x hands behind head (added to HEP) Red band rows 10x anchored on doorknob (given red band with handles for home) (added to HEP) Red band bil shoulder extensions 10x (added to HEP) Wall push ups 10x (added to HEP) Shrugs 3# (daughter has weights) 2 rounds of 5 reps Biceps 3# 2 rounds of 5 reps right/left Nustep seat 7 (limited knee ROM limited)  5 min L1  DATE: 01/11/23 Initial eval completed and initiated HEP   PATIENT EDUCATION:  Education details: Initiated HEP Person educated: Patient Education method: Programmer, multimedia, Facilities manager, Verbal cues, and Handouts Education comprehension: verbalized understanding, returned demonstration, and verbal cues required  HOME EXERCISE PROGRAM: Access Code: [email or web address] URL: [email or web address] Date: 01/31/2023 Prepared by: Lavinia Sharps  Exercises - Shoulder Rolls in Sitting  - 1 x daily - 7 x weekly - 1 sets - 10 reps - Seated Cervical Retraction  - 1 x daily - 7 x weekly - 1 sets - 10 reps - Seated Cervical Rotation AROM  - 1 x daily - 7 x weekly - 1 sets - 10 reps - Seated Cervical Sidebending AROM  - 1 x daily - 7 x weekly - 1 sets - 10 reps -  Doorway Pec Stretch at 90 Degrees Abduction  - 1 x daily - 7 x weekly - 1 sets - 10 reps - 10 sec hold - Wrist Extension Stretch at Wall  - 1 x daily - 7 x weekly - 1 sets - 10 reps - 10 sec hold - Seated Assisted Cervical Rotation with Towel  - 1 x daily - 7 x weekly - 1 sets - 10 reps - Seated Thoracic Lumbar Extension with Pectoralis Stretch  - 1 x daily - 7 x weekly - 1 sets - 10 reps - Standing Bilateral Low Shoulder Row with Anchored Resistance  - 1 x daily - 7 x weekly - 1 sets - 10 reps - Shoulder extension with resistance - Neutral  - 1 x daily - 7 x weekly - 1 sets - 10 reps - Wall Push Up  - 1 x daily - 7 x weekly - 1 sets - 10 reps  ASSESSMENT:  CLINICAL IMPRESSION: Although she reports intermittent headache this morning, she denies headache or neck pain during or at the conclusion of the session. We have discussed sleeping positions since that seems to trigger the onset of headaches.  She demonstrates excellent compliance with HEP and is using a red band and 2 pound weights at home.  Able to progress ex to include additional postural strengthening.  Therapist providing verbal cues to optimize technique with  exercises in order to achieve the greatest benefit.   OBJECTIVE IMPAIRMENTS: decreased ROM, decreased strength, increased fascial restrictions, increased muscle spasms, impaired flexibility, impaired UE functional use, postural dysfunction, and pain.   ACTIVITY LIMITATIONS: carrying, lifting, sleeping, transfers, bed mobility, bathing, toileting, dressing, reach over head, hygiene/grooming, and caring for others  PARTICIPATION LIMITATIONS: meal prep, cleaning, laundry, driving, shopping, community activity, and church  PERSONAL FACTORS: Age, Fitness, and 1-2 comorbidities: CKD and chronic bronchitis  are also affecting patient's functional outcome.   REHAB POTENTIAL: Good  CLINICAL DECISION MAKING: Stable/uncomplicated  EVALUATION COMPLEXITY: Low   GOALS: Goals reviewed  with patient? Yes  SHORT TERM GOALS: Target date: 02/08/2023   Pain report to be no greater than 4/10  Baseline:  Goal status: ongoing  2.  Patient will be independent with initial HEP  Baseline:  Goal status: met 1/14  3.  Patient to be able to reach back of head for grooming with bilateral shoulders Baseline:  Goal status: met 1/14   LONG TERM GOALS: Target date: 03/08/2023  Patient to report pain no greater than 2/10  Baseline:  Goal status: INITIAL  2.  Patient will be able to reach overhead into cabinets and on top of shelves without pain  Baseline:  Goal status: INITIAL  3.  Patient to  be able to sleep through the night  Baseline:  Goal status: INITIAL  4.  Patient to be independent with advanced HEP  Baseline:  Goal status: INITIAL  5.  Patient to report 85% improvement in overall symptoms  Baseline:  Goal status: INITIAL  6.  Patient to be able to do all ADL's and IADL's without exacerbation of shoulder and neck pain Baseline:  Goal status: INITIAL   PLAN:  PT FREQUENCY: 1-2x/week  PT DURATION: 8 weeks  PLANNED INTERVENTIONS: 97110-Therapeutic exercises, 97530- Therapeutic activity, [id number]- Neuromuscular re-education, 97535- Self Care, [id number]- Manual therapy, [id number]- Gait training, [id number]- Aquatic Therapy, [id number]- Electrical stimulation (unattended), [id number]- Electrical stimulation (manual), [id number]- Ultrasound, [id number]- Traction (mechanical), [id number]- Ionotophoresis 4mg /ml Dexamethasone, Patient/Family education, Taping, Dry Needling, Spinal mobilization, Scar mobilization, Cryotherapy, and Moist heat  PLAN FOR NEXT SESSION:   Nustep, manual techniques, postural stability, hip and LE strength  Larie Mathes B. Athene Schuhmacher, PT 02/09/23 8:08 PM [facility] [address], [address] [place], [place] [address] Phone # [phone] Fax [phone]  Phone: [phone] Fax: [phone]

## 2023-02-14 ENCOUNTER — Ambulatory Visit: Payer: HMO | Admitting: Physical Therapy

## 2023-02-14 DIAGNOSIS — M542 Cervicalgia: Secondary | ICD-10-CM | POA: Diagnosis not present

## 2023-02-14 DIAGNOSIS — M6281 Muscle weakness (generalized): Secondary | ICD-10-CM

## 2023-02-14 DIAGNOSIS — R252 Cramp and spasm: Secondary | ICD-10-CM

## 2023-02-14 NOTE — Therapy (Signed)
OUTPATIENT PHYSICAL THERAPY CERVICAL TREATMENT NOTE   Patient Name: Diane Daugherty MRN: [id number] DOB:04-25-1940, 83 y.o., female Today's Date: 02/14/2023  END OF SESSION:  PT End of Session - 02/14/23 1016     Visit Number 6    Date for PT Re-Evaluation 03/08/23    Authorization Type [employer]    PT Start Time 1017    PT Stop Time 1100    PT Time Calculation (min) 43 min    Activity Tolerance Patient tolerated treatment well             Past Medical History:  Diagnosis Date   Anemia 09/24/1968   "after childbirth"   Arthritis    "feels like all over"   Carpal tunnel syndrome    Carpal tunnel syndrome    left   Cataract    immature on right   Chronic bronchitis (HCC)    "get it most years" (10/08/2014)   Chronic kidney disease    stage III, per Pt   Family history of anesthesia complication    daughter gets sick   History of blood transfusion    "w/one of my back OR's"   Hypertension    takes Hyzaar daily   Joint pain    "all over"   Joint swelling    Muscle spasms of lower extremity    takes Baclofen daily prn   Nocturia    OSA on CPAP    Peripheral vascular disease (HCC)    PFO (patent foramen ovale)    positive saline bubble study 05/12/09 ([facility])   Pneumonia 2010/ 2011   PONV (postoperative nausea and vomiting)    woke up during hysterectomy   Shortness of breath    with exertion;Albuterol prn   Urinary frequency    Past Surgical History:  Procedure Laterality Date   ABDOMINAL HYSTERECTOMY  1970's?   APPENDECTOMY     BACK SURGERY     BREAST BIOPSY Right 1990's   CESAREAN SECTION  1962; 1963; 1970   CHEST TUBE INSERTION  80's   d/t pneumothorax   COLONOSCOPY     FRACTURE SURGERY     HARDWARE REMOVAL Right 1990's   "took screw out of my lower leg ~ 1 yr after putting it in"   HEMILAMINOTOMY LUMBAR SPINE  02/2002   L5; decompression of the L5 and S1 nerve root; synovial cyst excision/notes 06/09/2010   KNEE ARTHROSCOPY Right  ?2013   LUMBAR LAMINECTOMY/DECOMPRESSION MICRODISCECTOMY  02/2010   POSTERIOR LUMBAR FUSION  05/2003   L4-5 and S-1    TIBIA FRACTURE SURGERY Right 1990's   "put screw in"   TOTAL KNEE ARTHROPLASTY Right 08/28/2012   Procedure: TOTAL KNEE ARTHROPLASTY;  Surgeon: Valeria Batman, MD;  Location: [facility] OR;  Service: Orthopedics;  Laterality: Right;   TOTAL KNEE ARTHROPLASTY Left 10/07/2014   TOTAL KNEE ARTHROPLASTY Left 10/07/2014   Procedure: TOTAL KNEE ARTHROPLASTY;  Surgeon: Valeria Batman, MD;  Location: [facility] OR;  Service: Orthopedics;  Laterality: Left;   TOTAL KNEE REVISION Left 10/08/2021   Procedure: LEFT TOTAL KNEE REVISION;  Surgeon: Kathryne Hitch, MD;  Location: [facility] ORS;  Service: Orthopedics;  Laterality: Left;   Patient Active Problem List   Diagnosis Date Noted   Persistent headaches 12/20/2022   Neck pain 11/17/2022   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   Status post revision of total replacement of left knee 10/08/2021   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   Stage  3a chronic kidney disease (HCC) 05/27/2021   History of multiple myeloma 05/27/2021   Primary osteoarthritis of left knee 10/07/2014   History of left knee replacement 10/07/2014   Asthma, chronic 08/30/2012   Essential hypertension 08/30/2012   Osteoarthritis of right knee 08/30/2012   OSA on CPAP 08/30/2012   Severe obesity (BMI >= 40) (HCC) 08/30/2012    PCP: [name], MD  REFERRING PROVIDER: [name], MD  REFERRING DIAG: M54.2 (ICD-10-CM) - Cervicalgia  THERAPY DIAG:  Cervicalgia  Cramp and spasm  Muscle weakness (generalized)  Rationale for Evaluation and Treatment: Rehabilitation  ONSET DATE: 12/29/2022  SUBJECTIVE:                                                                                                                                                                                                          SUBJECTIVE STATEMENT: No headache today.  Able to sleep on my new pillows now!  I think I'm getting better but not 100%.     EVAL: Patient reports pain since August 24th.  She woke up with her neck stiff and it never went away.  She's also started having headaches.  She is taking a muscle relaxer and neuro prescribed something for her headaches.  She was not able to take the medication from the neuro doc.  This made her confused and nauseated.  She is retired and does just basics around the house.  Her usual housework including.  Hand dominance: Right  PERTINENT HISTORY:  Lumbar fusion several years ago  PAIN:  Are you having pain? Yes: NPRS scale: 1-2/10 Pain location: right back of head and temple but not present at the moment Pain description: aching but occasionally sharp Aggravating factors: taking a shower, getting dressed or bending over, laying on pillow Relieving factors: rest, pillow propping  PRECAUTIONS: None  RED FLAGS: None     WEIGHT BEARING RESTRICTIONS: No  FALLS:  Has patient fallen in last 6 months? 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  DIAGNOSTIC FINDINGS:  No specif  PATIENT SURVEYS:  FOTO 50, predicted 25  COGNITION: Overall cognitive status: Within functional limits for tasks assessed  SENSATION: WFL  POSTURE: rounded shoulders and forward head  PALPATION: Taut bands and trigger point bilateral  upper traps, levators and rhomboids   CERVICAL ROM:   Active ROM A/PROM (deg) eval 1/14  Flexion 32 62  Extension 40 41  Right lateral flexion 35 40  Left lateral flexion 38 46  Right rotation 50 65  Left rotation 60 65   (Blank rows = not tested)  UPPER EXTREMITY ROM:  1/14: able to touch back of the head easily with both hands (arms get tired with sustained hold)   UPPER EXTREMITY MMT:  Generally   CERVICAL SPECIAL TESTS:  Spurling's test: Positive and Distraction test: Positive  TODAY'S TREATMENT:    DATE: 02/14/23 Nustep seat 7, arms 8: blue machine (limited knee ROM limited)  10 min L3   Seated 1# punches 10x Seated 4# plyoball core series: hip to hip, shoulder to hip,  ear to ear 5x each Seated blue band  upward row (cord anchored under feet) 10x  Wall push ups 10x; side to side push ups 5x Back to the wall posture ex's:  yellow band elevation, horizontal abduction, sash, external rotation 5x each Community ex options: pt really likes the [employer] ([place], [place], [employer] pt lives near here) Back to the wall: head press against folded towel with palm press to wall 10x 5 sec holds   DATE: 02/09/23 Nustep seat 7, arms 8: green machine (limited knee ROM limited)  10 min L5   MELT techniques: with blue foam roller : rotation, nods, rotated to right figure 8's, rotated left figure 8's x 20 each (10 each side) Manual: PROM to Cspine, upper trap stretch, levator stretch, STM C spine, upper traps and levator, manual traction  DATE: 02/07/23 Nustep seat 7, arms 8: green machine (limited knee ROM limited)  10 min L5   Cervical towel roll for sleeping Seated 4# plyoball core series: hip to hip, shoulder to hip, Vs, ear to ear 8x each Seated pink power cord dead lifts (cord anchored under feet) 10x (verbal cue to sit up tall) Wall push ups 5x; angled push ups 5x Back to the wall posture ex's:  inhale with bil arm elevation/exhale as you lower 5x; snow angel with inhale on  elevation, lower with exhale 5x Back to the wall isometric head push on pillow and backs of hands into the wall 5 sec hold 5x Back to the wall using cane shoulder horizontal abduction with head rotation 5x     DATE: 02/02/23 Discussed sleeping option of wedge and 1 pillow vs 2 pillows (if often waking with headache) Rotation SNAG with towel 10x right/left  Seated cervical flexion with self overpressure 10x Seated thoracic extension with ball 10x hands behind head (arms get tired) Seated foam roll behind head: rotation and nods 10x each Red band rows 10x anchored on doorknob 10x Red band row and rotate head 5x right/left Red band bil shoulder extensions 5x; single arm 5x right/left  Wall push ups 5x; angled push ups 5x Shrugs 2# (daughter has 2# weights) 2 rounds of 5 reps Biceps 2# 2 rounds of 5 reps right/left 2# overhead punches 2 rounds 5 reps Nustep seat 8 green machine (limited knee ROM limited)  6 min L6  DATE: 01/31/23 Rotation SNAG with towel 10x right/left (added to HEP) Seated cervical flexion 10x Seated thoracic extension with ball 10x hands behind head (added to HEP) Red band rows 10x anchored  on doorknob (given red band with handles for home) (added to HEP) Red band bil shoulder extensions 10x (added to HEP) Wall push ups 10x (added to HEP) Shrugs 3# (daughter has weights) 2 rounds of 5 reps Biceps 3# 2 rounds of 5 reps right/left Nustep seat 7 (limited knee ROM limited)  5 min L1     PATIENT EDUCATION:  Education details: Initiated HEP Person educated: Patient Education method: Programmer, multimedia, Facilities manager, Verbal cues, and Handouts Education comprehension: verbalized understanding, returned demonstration, and verbal cues required  HOME EXERCISE PROGRAM: Access Code: [email or web address] URL: [email or web address] Date: 01/31/2023 Prepared by: Lavinia Sharps  Exercises - Shoulder Rolls in Sitting  - 1 x daily - 7 x weekly - 1 sets - 10 reps - Seated Cervical  Retraction  - 1 x daily - 7 x weekly - 1 sets - 10 reps - Seated Cervical Rotation AROM  - 1 x daily - 7 x weekly - 1 sets - 10 reps - Seated Cervical Sidebending AROM  - 1 x daily - 7 x weekly - 1 sets - 10 reps - Doorway Pec Stretch at 90 Degrees Abduction  - 1 x daily - 7 x weekly - 1 sets - 10 reps - 10 sec hold - Wrist Extension Stretch at Wall  - 1 x daily - 7 x weekly - 1 sets - 10 reps - 10 sec hold - Seated Assisted Cervical Rotation with Towel  - 1 x daily - 7 x weekly - 1 sets - 10 reps - Seated Thoracic Lumbar Extension with Pectoralis Stretch  - 1 x daily - 7 x weekly - 1 sets - 10 reps - Standing Bilateral Low Shoulder Row with Anchored Resistance  - 1 x daily - 7 x weekly - 1 sets - 10 reps - Shoulder extension with resistance - Neutral  - 1 x daily - 7 x weekly - 1 sets - 10 reps - Wall Push Up  - 1 x daily - 7 x weekly - 1 sets - 10 reps  ASSESSMENT:  CLINICAL IMPRESSION: Patient states, "I feel good" at the end of session with no headache today.  Therapist providing verbal cues to optimize technique with exercises in order to achieve the greatest benefit.  She typically does more repetitions than asked.  She is receptive to exercise after course of PT when she feels more confident in what to do.  Discussed various community options.  Decreased use of her cane in the clinic.     OBJECTIVE IMPAIRMENTS: decreased ROM, decreased strength, increased fascial restrictions, increased muscle spasms, impaired flexibility, impaired UE functional use, postural dysfunction, and pain.   ACTIVITY LIMITATIONS: carrying, lifting, sleeping, transfers, bed mobility, bathing, toileting, dressing, reach over head, hygiene/grooming, and caring for others  PARTICIPATION LIMITATIONS: meal prep, cleaning, laundry, driving, shopping, community activity, and church  PERSONAL FACTORS: Age, Fitness, and 1-2 comorbidities: CKD and chronic bronchitis  are also affecting patient's functional outcome.    REHAB POTENTIAL: Good  CLINICAL DECISION MAKING: Stable/uncomplicated  EVALUATION COMPLEXITY: Low   GOALS: Goals reviewed with patient? Yes  SHORT TERM GOALS: Target date: 02/08/2023   Pain report to be no greater than 4/10  Baseline:  Goal status: met 1/21  2.  Patient will be independent with initial HEP  Baseline:  Goal status: met 1/14  3.  Patient to be able to reach back of head for grooming with bilateral shoulders Baseline:  Goal status: met 1/14   LONG TERM GOALS: Target date: 03/08/2023  Patient to report pain no greater than 2/10  Baseline:  Goal status: INITIAL  2.  Patient will be able to reach overhead into cabinets and on top of shelves without pain  Baseline:  Goal status: INITIAL  3.  Patient to be able to sleep through the night  Baseline:  Goal status: INITIAL  4.  Patient to be independent with advanced HEP  Baseline:  Goal status: INITIAL  5.  Patient to report 85% improvement in overall symptoms  Baseline:  Goal status: INITIAL  6.  Patient to be able to do all ADL's and IADL's without exacerbation of shoulder and neck pain Baseline:  Goal status: INITIAL   PLAN:  PT FREQUENCY: 1-2x/week  PT DURATION: 8 weeks  PLANNED INTERVENTIONS: 97110-Therapeutic exercises, 97530- Therapeutic activity, [id number]- Neuromuscular re-education, 97535- Self Care, [id number]- Manual therapy, [id number]- Gait training, [phone]- Aquatic Therapy, [id number]- Electrical stimulation (unattended), [id number]- Electrical stimulation (manual), [id number]- Ultrasound, [id number]- Traction (mechanical), [id number]- Ionotophoresis 4mg /ml Dexamethasone, Patient/Family education, Taping, Dry Needling, Spinal mobilization, Scar mobilization, Cryotherapy, and Moist heat  PLAN FOR NEXT SESSION:   Nustep (let pt set herself up) discussed , manual techniques, postural stability, hip and LE strength  Lavinia Sharps, PT 02/14/23 12:18 PM Phone: [phone] Fax: [phone]

## 2023-02-15 ENCOUNTER — Inpatient Hospital Stay: Payer: HMO

## 2023-02-16 ENCOUNTER — Other Ambulatory Visit: Payer: Self-pay

## 2023-02-16 ENCOUNTER — Ambulatory Visit: Payer: HMO

## 2023-02-16 ENCOUNTER — Inpatient Hospital Stay: Payer: HMO | Attending: Licensed Clinical Social Worker

## 2023-02-16 DIAGNOSIS — Z9049 Acquired absence of other specified parts of digestive tract: Secondary | ICD-10-CM | POA: Diagnosis not present

## 2023-02-16 DIAGNOSIS — N1831 Chronic kidney disease, stage 3a: Secondary | ICD-10-CM | POA: Insufficient documentation

## 2023-02-16 DIAGNOSIS — Z90710 Acquired absence of both cervix and uterus: Secondary | ICD-10-CM | POA: Diagnosis not present

## 2023-02-16 DIAGNOSIS — R252 Cramp and spasm: Secondary | ICD-10-CM

## 2023-02-16 DIAGNOSIS — M47812 Spondylosis without myelopathy or radiculopathy, cervical region: Secondary | ICD-10-CM | POA: Diagnosis not present

## 2023-02-16 DIAGNOSIS — Z881 Allergy status to other antibiotic agents status: Secondary | ICD-10-CM | POA: Insufficient documentation

## 2023-02-16 DIAGNOSIS — Z885 Allergy status to narcotic agent status: Secondary | ICD-10-CM | POA: Insufficient documentation

## 2023-02-16 DIAGNOSIS — Z79899 Other long term (current) drug therapy: Secondary | ICD-10-CM | POA: Insufficient documentation

## 2023-02-16 DIAGNOSIS — C9000 Multiple myeloma not having achieved remission: Secondary | ICD-10-CM | POA: Insufficient documentation

## 2023-02-16 DIAGNOSIS — I129 Hypertensive chronic kidney disease with stage 1 through stage 4 chronic kidney disease, or unspecified chronic kidney disease: Secondary | ICD-10-CM | POA: Insufficient documentation

## 2023-02-16 DIAGNOSIS — R262 Difficulty in walking, not elsewhere classified: Secondary | ICD-10-CM

## 2023-02-16 DIAGNOSIS — M25562 Pain in left knee: Secondary | ICD-10-CM

## 2023-02-16 DIAGNOSIS — M47814 Spondylosis without myelopathy or radiculopathy, thoracic region: Secondary | ICD-10-CM | POA: Diagnosis not present

## 2023-02-16 DIAGNOSIS — M542 Cervicalgia: Secondary | ICD-10-CM

## 2023-02-16 DIAGNOSIS — M25662 Stiffness of left knee, not elsewhere classified: Secondary | ICD-10-CM

## 2023-02-16 DIAGNOSIS — Z886 Allergy status to analgesic agent status: Secondary | ICD-10-CM | POA: Diagnosis not present

## 2023-02-16 DIAGNOSIS — G4733 Obstructive sleep apnea (adult) (pediatric): Secondary | ICD-10-CM | POA: Insufficient documentation

## 2023-02-16 DIAGNOSIS — D472 Monoclonal gammopathy: Secondary | ICD-10-CM

## 2023-02-16 DIAGNOSIS — I700 Atherosclerosis of aorta: Secondary | ICD-10-CM | POA: Diagnosis not present

## 2023-02-16 DIAGNOSIS — M6281 Muscle weakness (generalized): Secondary | ICD-10-CM

## 2023-02-16 DIAGNOSIS — R293 Abnormal posture: Secondary | ICD-10-CM

## 2023-02-16 LAB — CMP (CANCER CENTER ONLY)
ALT: 10 U/L (ref 0–44)
AST: 16 U/L (ref 15–41)
Albumin: 4 g/dL (ref 3.5–5.0)
Alkaline Phosphatase: 58 U/L (ref 38–126)
Anion gap: 6 (ref 5–15)
BUN: 31 mg/dL — ABNORMAL HIGH (ref 8–23)
CO2: 29 mmol/L (ref 22–32)
Calcium: 10.2 mg/dL (ref 8.9–10.3)
Chloride: 99 mmol/L (ref 98–111)
Creatinine: 1.36 mg/dL — ABNORMAL HIGH (ref 0.44–1.00)
GFR, Estimated: 39 mL/min — ABNORMAL LOW (ref >60)
Glucose, Bld: 115 mg/dL — ABNORMAL HIGH (ref 70–99)
Potassium: 4.4 mmol/L (ref 3.5–5.1)
Sodium: 134 mmol/L — ABNORMAL LOW (ref 135–145)
Total Bilirubin: 0.4 mg/dL (ref 0.0–1.2)
Total Protein: 8.1 g/dL (ref 6.5–8.1)

## 2023-02-16 LAB — CBC WITH DIFFERENTIAL (CANCER CENTER ONLY)
Abs Immature Granulocytes: 0.02 10*3/uL (ref 0.00–0.07)
Basophils Absolute: 0 10*3/uL (ref 0.0–0.1)
Basophils Relative: 1 %
Eosinophils Absolute: 0.2 10*3/uL (ref 0.0–0.5)
Eosinophils Relative: 2 %
HCT: 34.4 % — ABNORMAL LOW (ref 36.0–46.0)
Hemoglobin: 11.8 g/dL — ABNORMAL LOW (ref 12.0–15.0)
Immature Granulocytes: 0 %
Lymphocytes Relative: 37 %
Lymphs Abs: 2.9 10*3/uL (ref 0.7–4.0)
MCH: 32.5 pg (ref 26.0–34.0)
MCHC: 34.3 g/dL (ref 30.0–36.0)
MCV: 94.8 fL (ref 80.0–100.0)
Monocytes Absolute: 0.3 10*3/uL (ref 0.1–1.0)
Monocytes Relative: 4 %
Neutro Abs: 4.3 10*3/uL (ref 1.7–7.7)
Neutrophils Relative %: 56 %
Platelet Count: 205 10*3/uL (ref 150–400)
RBC: 3.63 MIL/uL — ABNORMAL LOW (ref 3.87–5.11)
RDW: 14 % (ref 11.5–15.5)
WBC Count: 7.8 10*3/uL (ref 4.0–10.5)
nRBC: 0 % (ref 0.0–0.2)

## 2023-02-16 LAB — LACTATE DEHYDROGENASE: LDH: 131 U/L (ref 98–192)

## 2023-02-16 NOTE — Therapy (Signed)
OUTPATIENT PHYSICAL THERAPY CERVICAL TREATMENT NOTE   Patient Name: Diane Daugherty MRN: [id number] DOB:1940/05/18, 83 y.o., female Today's Date: 02/16/2023  END OF SESSION:  PT End of Session - 02/16/23 1021     Visit Number 7    Date for PT Re-Evaluation 03/08/23    Authorization Type [employer]    PT Start Time 1020    PT Stop Time 1100    PT Time Calculation (min) 40 min    Activity Tolerance Patient tolerated treatment well    Behavior During Therapy WFL for tasks assessed/performed             Past Medical History:  Diagnosis Date   Anemia 09/24/1968   "after childbirth"   Arthritis    "feels like all over"   Carpal tunnel syndrome    Carpal tunnel syndrome    left   Cataract    immature on right   Chronic bronchitis (HCC)    "get it most years" (10/08/2014)   Chronic kidney disease    stage III, per Pt   Family history of anesthesia complication    daughter gets sick   History of blood transfusion    "w/one of my back OR's"   Hypertension    takes Hyzaar daily   Joint pain    "all over"   Joint swelling    Muscle spasms of lower extremity    takes Baclofen daily prn   Nocturia    OSA on CPAP    Peripheral vascular disease (HCC)    PFO (patent foramen ovale)    positive saline bubble study 05/12/09 ([facility])   Pneumonia 2010/ 2011   PONV (postoperative nausea and vomiting)    woke up during hysterectomy   Shortness of breath    with exertion;Albuterol prn   Urinary frequency    Past Surgical History:  Procedure Laterality Date   ABDOMINAL HYSTERECTOMY  1970's?   APPENDECTOMY     BACK SURGERY     BREAST BIOPSY Right 1990's   CESAREAN SECTION  1962; 1963; 1970   CHEST TUBE INSERTION  80's   d/t pneumothorax   COLONOSCOPY     FRACTURE SURGERY     HARDWARE REMOVAL Right 1990's   "took screw out of my lower leg ~ 1 yr after putting it in"   HEMILAMINOTOMY LUMBAR SPINE  02/2002   L5; decompression of the L5 and S1 nerve root; synovial  cyst excision/notes 06/09/2010   KNEE ARTHROSCOPY Right ?2013   LUMBAR LAMINECTOMY/DECOMPRESSION MICRODISCECTOMY  02/2010   POSTERIOR LUMBAR FUSION  05/2003   L4-5 and S-1    TIBIA FRACTURE SURGERY Right 1990's   "put screw in"   TOTAL KNEE ARTHROPLASTY Right 08/28/2012   Procedure: TOTAL KNEE ARTHROPLASTY;  Surgeon: Valeria Batman, MD;  Location: [facility] OR;  Service: Orthopedics;  Laterality: Right;   TOTAL KNEE ARTHROPLASTY Left 10/07/2014   TOTAL KNEE ARTHROPLASTY Left 10/07/2014   Procedure: TOTAL KNEE ARTHROPLASTY;  Surgeon: Valeria Batman, MD;  Location: [facility] OR;  Service: Orthopedics;  Laterality: Left;   TOTAL KNEE REVISION Left 10/08/2021   Procedure: LEFT TOTAL KNEE REVISION;  Surgeon: Kathryne Hitch, MD;  Location: [facility] ORS;  Service: Orthopedics;  Laterality: Left;   Patient Active Problem List   Diagnosis Date Noted   Persistent headaches 12/20/2022   Neck pain 11/17/2022   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   Status post revision of total replacement of left knee 10/08/2021   Failed total left knee replacement (HCC)  10/07/2021   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   Stage 3a chronic kidney disease (HCC) 05/27/2021   History of multiple myeloma 05/27/2021   Primary osteoarthritis of left knee 10/07/2014   History of left knee replacement 10/07/2014   Asthma, chronic 08/30/2012   Essential hypertension 08/30/2012   Osteoarthritis of right knee 08/30/2012   OSA on CPAP 08/30/2012   Severe obesity (BMI >= 40) (HCC) 08/30/2012    PCP: [name], MD  REFERRING PROVIDER: [name], MD  REFERRING DIAG: M54.2 (ICD-10-CM) - Cervicalgia  THERAPY DIAG:  Cervicalgia  Cramp and spasm  Muscle weakness (generalized)  Abnormal posture  Acute pain of left knee  Difficulty in walking, not elsewhere classified  Stiffness of left knee, not elsewhere classified  Rationale for Evaluation and Treatment: Rehabilitation  ONSET DATE: 12/29/2022  SUBJECTIVE:                                                                                                                                                                                                          SUBJECTIVE STATEMENT: Patient reports she is continuing to do well. I did wake up with a headache this morning but I think it's due to stress.  My daughters bank account was hacked and I have been really worried about her.     EVAL: Patient reports pain since August 24th.  She woke up with her neck stiff and it never went away.  She's also started having headaches.  She is taking a muscle relaxer and neuro prescribed something for her headaches.  She was not able to take the medication from the neuro doc.  This made her confused and nauseated.  She is retired and does just basics around the house.  Her usual housework including.  Hand dominance: Right  PERTINENT HISTORY:  Lumbar fusion several years ago  PAIN:  Are you having pain? Yes: NPRS scale: 1-2/10 Pain location: right back of head and temple but not present at the moment Pain description: aching but occasionally sharp Aggravating factors: taking a shower, getting dressed or bending over, laying on pillow Relieving factors: rest, pillow propping  PRECAUTIONS: None  RED FLAGS: None     WEIGHT BEARING RESTRICTIONS: No  FALLS:  Has patient fallen in last 6 months? 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  DIAGNOSTIC FINDINGS:  No specif  PATIENT SURVEYS:  FOTO 50, predicted 59  COGNITION: Overall cognitive status: Within functional limits for tasks assessed  SENSATION: WFL  POSTURE: rounded shoulders and forward head  PALPATION: Taut bands and trigger point bilateral upper traps, levators and rhomboids   CERVICAL ROM:   Active ROM A/PROM (deg) eval 1/14  Flexion 32 62  Extension 40 41  Right lateral flexion 35 40  Left lateral flexion 38 46  Right rotation 50 65  Left rotation 60 65   (Blank rows = not tested)  UPPER EXTREMITY ROM:  1/14: able to touch back of the head easily with both hands (arms get tired with sustained hold)   UPPER EXTREMITY MMT:  Generally   CERVICAL SPECIAL TESTS:  Spurling's test: Positive and Distraction test: Positive  TODAY'S TREATMENT:    DATE: 02/16/23 Nustep seat 7, arms 8: blue machine (limited knee ROM limited)  10 min L3   Seated 1# punches 10x Seated 4# plyoball core series: hip to hip, shoulder to hip,  ear to ear 10 x each side Seated blue band  upward row (cord anchored under feet) 2 x 10  Wall push ups 10x; side to side push ups 5x Back to the wall posture ex's:  chin retraction x 20, yellow band elevation 2 x 10, horizontal abduction x 10, sash x 5 each side, external rotation 5x each Community ex options: pt really likes the [employer] ([place], [place], [employer] pt lives near here) Back to the wall: head press against folded towel with palm press to wall 10x 5 sec holds  DATE: 02/14/23 Nustep seat 7, arms 8: blue machine (limited knee ROM limited)  10 min L3   Seated 1# punches 10x Seated 4# plyoball core series: hip to hip, shoulder to hip,  ear to ear 5x each Seated blue band  upward row (cord anchored under feet) 10x  Wall push ups 10x; side to side push ups 5x Back to the wall posture ex's:  yellow band elevation, horizontal abduction, sash, external rotation 5x each Community ex options: pt really  likes the [employer] ([place], [place], [employer] pt lives near here) Back to the wall: head press against folded towel with palm press to wall 10x 5 sec holds   DATE: 02/09/23 Nustep seat 7, arms 8: green machine (limited knee ROM limited)  10 min L5   MELT techniques: with blue foam roller : rotation, nods, rotated to right figure 8's, rotated left figure 8's x 20 each (10 each side) Manual: PROM to Cspine, upper trap stretch, levator stretch, STM C spine, upper traps and levator, manual traction    PATIENT EDUCATION:  Education details: Initiated HEP Person educated: Patient Education method: Programmer, multimedia, Facilities manager, Verbal cues, and Handouts Education comprehension: verbalized understanding, returned demonstration, and verbal cues required  HOME EXERCISE PROGRAM: Access Code: [email or web address] URL: [email or web address] Date: 01/31/2023 Prepared by: Lavinia Sharps  Exercises - Shoulder Rolls in Sitting  - 1 x daily - 7 x weekly - 1 sets - 10 reps - Seated Cervical Retraction  - 1 x daily - 7 x weekly - 1 sets - 10 reps - Seated Cervical Rotation AROM  - 1 x daily - 7 x weekly - 1 sets - 10 reps -  Seated Cervical Sidebending AROM  - 1 x daily - 7 x weekly - 1 sets - 10 reps - Doorway Pec Stretch at 90 Degrees Abduction  - 1 x daily - 7 x weekly - 1 sets - 10 reps - 10 sec hold - Wrist Extension Stretch at Wall  - 1 x daily - 7 x weekly - 1 sets - 10 reps - 10 sec hold - Seated Assisted Cervical Rotation with Towel  - 1 x daily - 7 x weekly - 1 sets - 10 reps - Seated Thoracic Lumbar Extension with Pectoralis Stretch  - 1 x daily - 7 x weekly - 1 sets - 10 reps - Standing Bilateral Low Shoulder Row with Anchored Resistance  - 1 x daily - 7 x weekly - 1 sets - 10 reps - Shoulder extension with resistance - Neutral  - 1 x daily - 7 x weekly - 1 sets - 10 reps - Wall Push Up  - 1 x daily - 7 x weekly - 1 sets - 10 reps  ASSESSMENT:  CLINICAL IMPRESSION: Tylar is progressing  appropriately.  She is extremely weak in shoulder ER.  She completed all tasks today with no increase in pain.  Confirmed that she is working on community options.  No cane use today during session.  She would benefit from continuing skilled PT for postural strength.       OBJECTIVE IMPAIRMENTS: decreased ROM, decreased strength, increased fascial restrictions, increased muscle spasms, impaired flexibility, impaired UE functional use, postural dysfunction, and pain.   ACTIVITY LIMITATIONS: carrying, lifting, sleeping, transfers, bed mobility, bathing, toileting, dressing, reach over head, hygiene/grooming, and caring for others  PARTICIPATION LIMITATIONS: meal prep, cleaning, laundry, driving, shopping, community activity, and church  PERSONAL FACTORS: Age, Fitness, and 1-2 comorbidities: CKD and chronic bronchitis  are also affecting patient's functional outcome.   REHAB POTENTIAL: Good  CLINICAL DECISION MAKING: Stable/uncomplicated  EVALUATION COMPLEXITY: Low   GOALS: Goals reviewed with patient? Yes  SHORT TERM GOALS: Target date: 02/08/2023   Pain report to be no greater than 4/10  Baseline:  Goal status: met 1/21  2.  Patient will be independent with initial HEP  Baseline:  Goal status: met 1/14  3.  Patient to be able to reach back of head for grooming with bilateral shoulders Baseline:  Goal status: met 1/14   LONG TERM GOALS: Target date: 03/08/2023  Patient to report pain no greater than 2/10  Baseline:  Goal status: INITIAL  2.  Patient will be able to reach overhead into cabinets and on top of shelves without pain  Baseline:  Goal status: INITIAL  3.  Patient to be able to sleep through the night  Baseline:  Goal status: INITIAL  4.  Patient to be independent with advanced HEP  Baseline:  Goal status: INITIAL  5.  Patient to report 85% improvement in overall symptoms  Baseline:  Goal status: INITIAL  6.  Patient to be able to do all ADL's and  IADL's without exacerbation of shoulder and neck pain Baseline:  Goal status: INITIAL   PLAN:  PT FREQUENCY: 1-2x/week  PT DURATION: 8 weeks  PLANNED INTERVENTIONS: 97110-Therapeutic exercises, 97530- Therapeutic activity, [id number]- Neuromuscular re-education, 97535- Self Care, [id number]- Manual therapy, [id number]- Gait training, [id number]- Aquatic Therapy, [id number]- Electrical stimulation (unattended), [id number]- Electrical stimulation (manual), [id number]- Ultrasound, [id number]- Traction (mechanical), [id number]- Ionotophoresis 4mg /ml Dexamethasone, Patient/Family education, Taping, Dry Needling, Spinal mobilization, Scar mobilization, Cryotherapy, and Moist heat  PLAN FOR NEXT SESSION:  Nustep (let pt set herself up) discussed , manual techniques, postural stability, hip and LE strength  Terence Bart B. Milad Bublitz, PT 02/16/23 3:10 PM [facility] [address], [address] [place], [place] [address] Phone # [phone] Fax [phone]

## 2023-02-17 LAB — KAPPA/LAMBDA LIGHT CHAINS
Kappa free light chain: 76.8 mg/L — ABNORMAL HIGH (ref 3.3–19.4)
Kappa, lambda light chain ratio: 10.67 — ABNORMAL HIGH (ref 0.26–1.65)
Lambda free light chains: 7.2 mg/L (ref 5.7–26.3)

## 2023-02-19 DIAGNOSIS — C9000 Multiple myeloma not having achieved remission: Secondary | ICD-10-CM | POA: Diagnosis not present

## 2023-02-20 ENCOUNTER — Ambulatory Visit (HOSPITAL_COMMUNITY)
Admission: RE | Admit: 2023-02-20 | Discharge: 2023-02-20 | Disposition: A | Discharge status: Home / Self Care | Payer: HMO | Source: Ambulatory Visit | Attending: Physician Assistant | Admitting: Physician Assistant

## 2023-02-20 DIAGNOSIS — C9000 Multiple myeloma not having achieved remission: Secondary | ICD-10-CM | POA: Diagnosis not present

## 2023-02-20 DIAGNOSIS — D472 Monoclonal gammopathy: Secondary | ICD-10-CM | POA: Insufficient documentation

## 2023-02-20 DIAGNOSIS — M160 Bilateral primary osteoarthritis of hip: Secondary | ICD-10-CM | POA: Diagnosis not present

## 2023-02-20 DIAGNOSIS — M47812 Spondylosis without myelopathy or radiculopathy, cervical region: Secondary | ICD-10-CM | POA: Diagnosis not present

## 2023-02-20 DIAGNOSIS — M47814 Spondylosis without myelopathy or radiculopathy, thoracic region: Secondary | ICD-10-CM | POA: Diagnosis not present

## 2023-02-20 LAB — MULTIPLE MYELOMA PANEL, SERUM
Albumin SerPl Elph-Mcnc: 3.6 g/dL (ref 2.9–4.4)
Albumin/Glob SerPl: 1 (ref 0.7–1.7)
Alpha 1: 0.2 g/dL (ref 0.0–0.4)
Alpha2 Glob SerPl Elph-Mcnc: 0.8 g/dL (ref 0.4–1.0)
B-Globulin SerPl Elph-Mcnc: 0.9 g/dL (ref 0.7–1.3)
Gamma Glob SerPl Elph-Mcnc: 2 g/dL — ABNORMAL HIGH (ref 0.4–1.8)
Globulin, Total: 4 g/dL — ABNORMAL HIGH (ref 2.2–3.9)
IgA: 38 mg/dL — ABNORMAL LOW (ref 64–422)
IgG (Immunoglobin G), Serum: 2283 mg/dL — ABNORMAL HIGH (ref 586–1602)
IgM (Immunoglobulin M), Srm: 15 mg/dL — ABNORMAL LOW (ref 26–217)
M Protein SerPl Elph-Mcnc: 1.5 g/dL — ABNORMAL HIGH
Total Protein ELP: 7.6 g/dL (ref 6.0–8.5)

## 2023-02-21 ENCOUNTER — Ambulatory Visit: Payer: HMO | Admitting: Physical Therapy

## 2023-02-21 DIAGNOSIS — M542 Cervicalgia: Secondary | ICD-10-CM

## 2023-02-21 DIAGNOSIS — M6281 Muscle weakness (generalized): Secondary | ICD-10-CM

## 2023-02-21 DIAGNOSIS — R252 Cramp and spasm: Secondary | ICD-10-CM

## 2023-02-21 NOTE — Therapy (Signed)
OUTPATIENT PHYSICAL THERAPY CERVICAL TREATMENT NOTE   Patient Name: Diane Daugherty MRN: [id number] DOB:1940/05/15, 83 y.o., female Today's Date: 02/21/2023  END OF SESSION:  PT End of Session - 02/21/23 1012     Visit Number 8    Date for PT Re-Evaluation 03/08/23    Authorization Type [employer]    PT Start Time 1015    PT Stop Time 1055    PT Time Calculation (min) 40 min    Activity Tolerance Patient tolerated treatment well             Past Medical History:  Diagnosis Date   Anemia 09/24/1968   "after childbirth"   Arthritis    "feels like all over"   Carpal tunnel syndrome    Carpal tunnel syndrome    left   Cataract    immature on right   Chronic bronchitis (HCC)    "get it most years" (10/08/2014)   Chronic kidney disease    stage III, per Pt   Family history of anesthesia complication    daughter gets sick   History of blood transfusion    "w/one of my back OR's"   Hypertension    takes Hyzaar daily   Joint pain    "all over"   Joint swelling    Muscle spasms of lower extremity    takes Baclofen daily prn   Nocturia    OSA on CPAP    Peripheral vascular disease (HCC)    PFO (patent foramen ovale)    positive saline bubble study 05/12/09 ([facility])   Pneumonia 2010/ 2011   PONV (postoperative nausea and vomiting)    woke up during hysterectomy   Shortness of breath    with exertion;Albuterol prn   Urinary frequency    Past Surgical History:  Procedure Laterality Date   ABDOMINAL HYSTERECTOMY  1970's?   APPENDECTOMY     BACK SURGERY     BREAST BIOPSY Right 1990's   CESAREAN SECTION  1962; 1963; 1970   CHEST TUBE INSERTION  80's   d/t pneumothorax   COLONOSCOPY     FRACTURE SURGERY     HARDWARE REMOVAL Right 1990's   "took screw out of my lower leg ~ 1 yr after putting it in"   HEMILAMINOTOMY LUMBAR SPINE  02/2002   L5; decompression of the L5 and S1 nerve root; synovial cyst excision/notes 06/09/2010   KNEE ARTHROSCOPY Right  ?2013   LUMBAR LAMINECTOMY/DECOMPRESSION MICRODISCECTOMY  02/2010   POSTERIOR LUMBAR FUSION  05/2003   L4-5 and S-1    TIBIA FRACTURE SURGERY Right 1990's   "put screw in"   TOTAL KNEE ARTHROPLASTY Right 08/28/2012   Procedure: TOTAL KNEE ARTHROPLASTY;  Surgeon: Valeria Batman, MD;  Location: [facility] OR;  Service: Orthopedics;  Laterality: Right;   TOTAL KNEE ARTHROPLASTY Left 10/07/2014   TOTAL KNEE ARTHROPLASTY Left 10/07/2014   Procedure: TOTAL KNEE ARTHROPLASTY;  Surgeon: Valeria Batman, MD;  Location: [facility] OR;  Service: Orthopedics;  Laterality: Left;   TOTAL KNEE REVISION Left 10/08/2021   Procedure: LEFT TOTAL KNEE REVISION;  Surgeon: Kathryne Hitch, MD;  Location: [facility] ORS;  Service: Orthopedics;  Laterality: Left;   Patient Active Problem List   Diagnosis Date Noted   Persistent headaches 12/20/2022   Neck pain 11/17/2022   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   Status post revision of total replacement of left knee 10/08/2021   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   Stage  3a chronic kidney disease (HCC) 05/27/2021   History of multiple myeloma 05/27/2021   Primary osteoarthritis of left knee 10/07/2014   History of left knee replacement 10/07/2014   Asthma, chronic 08/30/2012   Essential hypertension 08/30/2012   Osteoarthritis of right knee 08/30/2012   OSA on CPAP 08/30/2012   Severe obesity (BMI >= 40) (HCC) 08/30/2012    PCP: [name], MD  REFERRING PROVIDER: [name], MD  REFERRING DIAG: M54.2 (ICD-10-CM) - Cervicalgia  THERAPY DIAG:  Cervicalgia  Cramp and spasm  Muscle weakness (generalized)  Rationale for Evaluation and Treatment: Rehabilitation  ONSET DATE: 12/29/2022  SUBJECTIVE:                                                                                                                                                                                                          SUBJECTIVE STATEMENT: Less often of waking up with headaches. No pain today.    EVAL: Patient reports pain since August 24th.  She woke up with her neck stiff and it never went away.  She's also started having headaches.  She is taking a muscle relaxer and neuro prescribed something for her headaches.  She was not able to take the medication from the neuro doc.  This made her confused and nauseated.  She is retired and does just basics around the house.  Her usual housework including.  Hand dominance: Right  PERTINENT HISTORY:  Lumbar fusion several years ago  PAIN:  Are you having pain? Yes: NPRS scale: 0/10 Pain location: right back of head and temple but not present at the moment Pain description: aching but occasionally sharp Aggravating factors: taking a shower, getting dressed or bending over, laying on pillow Relieving factors: rest, pillow propping  PRECAUTIONS: None  RED FLAGS: None     WEIGHT BEARING RESTRICTIONS: No  FALLS:  Has patient fallen in last 6 months? 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  DIAGNOSTIC FINDINGS:  No specif  PATIENT SURVEYS:  FOTO 50, predicted 90  COGNITION: Overall cognitive status: Within functional limits for tasks assessed  SENSATION:  WFL  POSTURE: rounded shoulders and forward head  PALPATION: Taut bands and trigger point bilateral upper traps, levators and rhomboids   CERVICAL  ROM:   Active ROM A/PROM (deg) eval 1/14 1/28  Flexion 32 62 65  Extension 40 41 45  Right lateral flexion 35 40 45  Left lateral flexion 38 46 45  Right rotation 50 65 65  Left rotation 60 65 65   (Blank rows = not tested)  UPPER EXTREMITY ROM:  1/14: able to touch back of the head easily with both hands (arms get tired with sustained hold)   UPPER EXTREMITY MMT:  Generally   CERVICAL SPECIAL TESTS:  Spurling's test: Positive and Distraction test: Positive  TODAY'S TREATMENT:    DATE: 02/21/23 Nustep seat 7, arms 9: blue machine (limited knee ROM limited)  10 min L3  while discussing status Seated 4# plyoball core series: hip to hip, shoulder to hip,  10 x each side Sit to stand with 4# plyoball overhead press 5x Standing modified dead lifts to tall cone holding 5 pound kettlebell 10x with verbal cues for forward gaze for neutral spine while hip hinging   Wall push ups 10x; side to side push ups 5x Back to the wall posture ex's with head against folded towel:  yellow band elevation  x 10, horizontal abduction x 10, sash x 5 each side, external rotation 10x each Resisted cable walk backwards using handles for isometric UE hold 10# 10x Seated cervical extension isometric pink ball press to the wall 5 sec holds 10x Seated cervical rotation with pink ball press to the wall 10x Seated cervical nods with pink ball on the wall 10x  DATE: 02/16/23 Nustep seat 7, arms 8: blue machine (limited knee ROM limited)  10 min L3   Seated 1# punches 10x Seated 4# plyoball core series: hip to hip, shoulder to hip,  ear to ear 10 x each side Seated blue band  upward row (cord anchored under feet) 2 x 10  Wall push ups 10x; side to side push ups 5x Back to the wall posture ex's:  chin retraction x 20, yellow band elevation 2 x 10, horizontal abduction x 10, sash x 5 each side, external rotation 5x each Community ex options: pt really likes the [employer] ([facility], [place], [facility] [employer] pt lives near  here) Back to the wall: head press against folded towel with palm press to wall 10x 5 sec holds  DATE: 02/14/23 Nustep seat 7, arms 8: blue machine (limited knee ROM limited)  10 min L3   Seated 1# punches 10x Seated 4# plyoball core series: hip to hip, shoulder to hip,  ear to ear 5x each Seated blue band  upward row (cord anchored under feet) 10x  Wall push ups 10x; side to side push ups 5x Back to the wall posture ex's:  yellow band elevation, horizontal abduction, sash, external rotation 5x each Community ex options: pt really likes the [employer] ([facility] [place], [employer] pt lives near here) Back to the wall: head press against folded towel with palm press to wall 10x 5 sec holds      PATIENT EDUCATION:  Education details: Initiated HEP Person educated: Patient Education method: Programmer, multimedia, Facilities manager, Verbal cues, and Handouts Education comprehension: verbalized understanding, returned demonstration, and verbal cues required  HOME EXERCISE PROGRAM: Access Code: [email or web address] URL: [email or web address] Date: 01/31/2023 Prepared by: Lavinia Sharps  Exercises - Shoulder Rolls in Sitting  - 1 x daily - 7 x weekly - 1 sets -  10 reps - Seated Cervical Retraction  - 1 x daily - 7 x weekly - 1 sets - 10 reps - Seated Cervical Rotation AROM  - 1 x daily - 7 x weekly - 1 sets - 10 reps - Seated Cervical Sidebending AROM  - 1 x daily - 7 x weekly - 1 sets - 10 reps - Doorway Pec Stretch at 90 Degrees Abduction  - 1 x daily - 7 x weekly - 1 sets - 10 reps - 10 sec hold - Wrist Extension Stretch at Wall  - 1 x daily - 7 x weekly - 1 sets - 10 reps - 10 sec hold - Seated Assisted Cervical Rotation with Towel  - 1 x daily - 7 x weekly - 1 sets - 10 reps - Seated Thoracic Lumbar Extension with Pectoralis Stretch  - 1 x daily - 7 x weekly - 1 sets - 10 reps - Standing Bilateral Low Shoulder Row with Anchored Resistance  - 1 x daily - 7 x weekly - 1 sets - 10 reps - Shoulder  extension with resistance - Neutral  - 1 x daily - 7 x weekly - 1 sets - 10 reps - Wall Push Up  - 1 x daily - 7 x weekly - 1 sets - 10 reps  ASSESSMENT:  CLINICAL IMPRESSION:    Able to progress exercises with the addition of resistance and challenge level of exercises.  Noga reports no symptoms today and is pleased with her abilities to lift the weights today.  She was planning for her daughter to go with her to check out [employer] but her daughter had her identity stolen so she's had a lot going on right now. Giselle states she still plans to check into it and has been highly compliant with HEP.   We discussed her progress and both agree that if she continues to feel better she will be ready for discharge in 2-3 visits.     OBJECTIVE IMPAIRMENTS: decreased ROM, decreased strength, increased fascial restrictions, increased muscle spasms, impaired flexibility, impaired UE functional use, postural dysfunction, and pain.   ACTIVITY LIMITATIONS: carrying, lifting, sleeping, transfers, bed mobility, bathing, toileting, dressing, reach over head, hygiene/grooming, and caring for others  PARTICIPATION LIMITATIONS: meal prep, cleaning, laundry, driving, shopping, community activity, and church  PERSONAL FACTORS: Age, Fitness, and 1-2 comorbidities: CKD and chronic bronchitis  are also affecting patient's functional outcome.   REHAB POTENTIAL: Good  CLINICAL DECISION MAKING: Stable/uncomplicated  EVALUATION COMPLEXITY: Low   GOALS: Goals reviewed with patient? Yes  SHORT TERM GOALS: Target date: 02/08/2023   Pain report to be no greater than 4/10  Baseline:  Goal status: met 1/21  2.  Patient will be independent with initial HEP  Baseline:  Goal status: met 1/14  3.  Patient to be able to reach back of head for grooming with bilateral shoulders Baseline:  Goal status: met 1/14   LONG TERM GOALS: Target date: 03/08/2023  Patient to report pain no greater than 2/10  Baseline:  Goal  status: INITIAL  2.  Patient will be able to reach overhead into cabinets and on top of shelves without pain  Baseline:  Goal status: INITIAL  3.  Patient to be able to sleep through the night  Baseline:  Goal status: INITIAL  4.  Patient to be independent with advanced HEP  Baseline:  Goal status: INITIAL  5.  Patient to report 85% improvement in overall symptoms  Baseline:  Goal status:  INITIAL  6.  Patient to be able to do all ADL's and IADL's without exacerbation of shoulder and neck pain Baseline:  Goal status: INITIAL   PLAN:  PT FREQUENCY: 1-2x/week  PT DURATION: 8 weeks  PLANNED INTERVENTIONS: 97110-Therapeutic exercises, 97530- Therapeutic activity, [id number]- Neuromuscular re-education, 97535- Self Care, [id number]- Manual therapy, [id number]- Gait training, [id number]- Aquatic Therapy, [id number]- Electrical stimulation (unattended), [id number]- Electrical stimulation (manual), [id number]- Ultrasound, [id number]- Traction (mechanical), [id number]- Ionotophoresis 4mg /ml Dexamethasone, Patient/Family education, Taping, Dry Needling, Spinal mobilization, Scar mobilization, Cryotherapy, and Moist heat  PLAN FOR NEXT SESSION:   decrease treatment frequency or discharge in 2-3 visits; Nustep (let pt set herself up) discussed , manual techniques, postural stability, hip and LE strength   Lavinia Sharps, PT 02/21/23 11:10 AM Phone: [phone] Fax: [phone]  [facility] [address] [place], [place] [address] Phone # [phone] Fax [phone]

## 2023-02-21 NOTE — Progress Notes (Unsigned)
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 Leonides Schanz. Kappa 77.3, Lambda 6.6, ratio 11.71.  09/25/2020: met survey shows no clear lytic lesions 10/01/2020: BmBx showed mildly hypercellular marrow involved by kappa-restricted plasma cell neoplasm (20%)  Interval History:  Diane Daugherty 83 y.o. female with medical history significant for smoldering multiple myeloma who presents for a follow up visit. The patient's last visit was on 08/17/2022. In the interim since the last visit she has had no major changes in her health.  On exam today Diane Daugherty reports ***.  She denies fevers, chills, night sweats, shortness of breath, chest pain or cough.  A full 10 point ROS is listed below.  MEDICAL HISTORY:  Past Medical History:  Diagnosis Date   Anemia 09/24/1968   "after childbirth"   Arthritis    "feels like all over"   Carpal tunnel syndrome    Carpal tunnel syndrome    left   Cataract    immature on right   Chronic bronchitis (HCC)    "get it most years" (10/08/2014)   Chronic kidney disease    stage III, per Pt   Family history of anesthesia complication    daughter gets sick   History of blood transfusion    "w/one of my back OR's"   Hypertension    takes Hyzaar daily   Joint pain    "all over"   Joint swelling    Muscle spasms of lower extremity    takes Baclofen daily prn   Nocturia    OSA on CPAP    Peripheral vascular disease (HCC)    PFO (patent foramen ovale)    positive saline bubble study 05/12/09 ([facility])   Pneumonia 2010/ 2011   PONV (postoperative nausea and vomiting)    woke up during hysterectomy   Shortness of breath    with exertion;Albuterol prn   Urinary  frequency     SURGICAL HISTORY: Past Surgical History:  Procedure Laterality Date   ABDOMINAL HYSTERECTOMY  1970's?   APPENDECTOMY     BACK SURGERY     BREAST BIOPSY Right 1990's   CESAREAN SECTION  1962; 1963; 1970   CHEST TUBE INSERTION  80's   d/t pneumothorax   COLONOSCOPY     FRACTURE SURGERY     HARDWARE REMOVAL Right 1990's   "took screw out of my lower leg ~ 1 yr after putting it in"   HEMILAMINOTOMY LUMBAR SPINE  02/2002   L5; decompression of the L5 and S1 nerve root; synovial cyst excision/notes 06/09/2010   KNEE ARTHROSCOPY Right ?2013   LUMBAR LAMINECTOMY/DECOMPRESSION MICRODISCECTOMY  02/2010   POSTERIOR LUMBAR FUSION  05/2003   L4-5 and S-1    TIBIA FRACTURE SURGERY Right 1990's   "put screw in"   TOTAL KNEE ARTHROPLASTY Right 08/28/2012   Procedure: TOTAL KNEE ARTHROPLASTY;  Surgeon: Valeria Batman, MD;  Location: [facility] OR;  Service: Orthopedics;  Laterality: Right;   TOTAL KNEE ARTHROPLASTY Left 10/07/2014   TOTAL KNEE ARTHROPLASTY Left 10/07/2014   Procedure: TOTAL KNEE ARTHROPLASTY;  Surgeon: Valeria Batman, MD;  Location: [facility] OR;  Service: Orthopedics;  Laterality: Left;   TOTAL KNEE REVISION Left 10/08/2021   Procedure: LEFT TOTAL KNEE REVISION;  Surgeon: Kathryne Hitch, MD;  Location: [facility] ORS;  Service: Orthopedics;  Laterality: Left;    SOCIAL HISTORY: Social History   Socioeconomic History   Marital status: Divorced    Spouse name: Not on file   Number of children: Not on file   Years of education: Not on file   Highest education level: 12th grade  Occupational History   Not on file  Tobacco Use   Smoking status: Never   Smokeless tobacco: Never  Vaping Use   Vaping status: Never Used  Substance and Sexual Activity   Alcohol use: No   Drug use: No   Sexual activity: Not Currently  Other Topics Concern   Not on file  Social History Narrative   Are you right handed or left handed? Right   Are you currently employed ?    What is your  current occupation? Retired/disability   Do you live at home alone?   Who lives with you? daughter   What type of home do you live in: 1 story or 2 story? two   Caffeine I in am    Social Drivers of Health   Financial Resource Strain: Low Risk  (01/07/2023)   Overall Financial Resource Strain (CARDIA)    Difficulty of Paying Living Expenses: Not very hard  Food Insecurity: No Food Insecurity (01/07/2023)   Hunger Vital Sign    Worried About Running Out of Food in the Last Year: Never true    Ran Out of Food in the Last Year: Never true  Transportation Needs: No Transportation Needs (01/07/2023)   PRAPARE - Administrator, Civil Service (Medical): No    Lack of Transportation (Non-Medical): No  Physical Activity: Insufficiently Active (01/07/2023)   Exercise Vital Sign    Days of Exercise per Week: 2 days    Minutes of Exercise per Session: 20 min  Stress: Stress Concern Present (01/07/2023)   [employer] of Occupational Health - Occupational Stress Questionnaire    Feeling of Stress : To some extent  Social Connections: Moderately Isolated (01/07/2023)   Social Connection and Isolation Panel [NHANES]    Frequency of Communication with Friends and Family: Once a week    Frequency of Social Gatherings with Friends and Family: Once a week    Attends Religious Services: More than 4 times per year    Active Member of [employer] or Organizations: Yes    Attends Banker Meetings: More than 4 times per year    Marital Status: Divorced  Intimate Partner Violence: Not At Risk (04/12/2022)   Humiliation, Afraid, Rape, and Kick questionnaire    Fear of Current or Ex-Partner: No    Emotionally Abused: No    Physically Abused: No    Sexually Abused: No    FAMILY HISTORY: No family history on file.  ALLERGIES:  is allergic to levaquin [levofloxacin], morphine and codeine, other, oxycodone, tramadol, and naprosyn [naproxen].  MEDICATIONS:  Current Outpatient  Medications  Medication Sig Dispense Refill   acetaminophen (TYLENOL) 500 MG tablet Take 500 mg by mouth every 6 (six) hours as needed.     amLODipine (NORVASC) 5 MG tablet Take 1 tablet (5 mg total) by mouth daily. 90 tablet 3   baclofen (LIORESAL) 10 MG tablet Take 1 tablet (10 mg total) by mouth 3 (three) times daily as needed for muscle spasms. 20 tablet 0   Calcium Citrate-Vitamin D (CALCIUM CITRATE + D PO) Take 1 tablet by mouth daily.     CHELATED IRON PO Take 27  mg by mouth daily.     fluticasone (FLONASE) 50 MCG/ACT nasal spray Place 2 sprays into both nostrils daily as needed for allergies or rhinitis. 16 g 1   HYDROcodone-acetaminophen (NORCO) 5-325 MG tablet Take 1 tablet by mouth every 6 (six) hours as needed for moderate pain (pain score 4-6). 15 tablet 0   Krill Oil 300 MG CAPS Take 300 mg by mouth daily.     Multiple Vitamins-Minerals (CENTRUM SILVER 50+WOMEN PO) Take 1 tablet by mouth daily.     nortriptyline (PAMELOR) 10 MG capsule Take 2 capsules (20 mg total) by mouth at bedtime. Take 1 capsule (10 mg) at bedtime for 1 week, then increase to 2 capsules (20 mg) at bedtime thereafter 60 capsule 4   valsartan-hydrochlorothiazide (DIOVAN-HCT) 320-12.5 MG tablet Take 1 tablet by mouth daily. 90 tablet 3   No current facility-administered medications for this visit.    REVIEW OF SYSTEMS:   Constitutional: ( - ) fevers, ( - )  chills , ( - ) night sweats Eyes: ( - ) blurriness of vision, ( - ) double vision, ( - ) watery eyes Ears, nose, mouth, throat, and face: ( - ) mucositis, ( - ) sore throat Respiratory: ( - ) cough, ( - ) dyspnea, ( - ) wheezes Cardiovascular: ( - ) palpitation, ( - ) chest discomfort, ( - ) lower extremity swelling Gastrointestinal:  ( - ) nausea, ( - ) heartburn, ( - ) change in bowel habits Skin: ( - ) abnormal skin rashes Lymphatics: ( - ) new lymphadenopathy, ( - ) easy bruising Neurological: ( - ) numbness, ( - ) tingling, ( - ) new  weaknesses Behavioral/Psych: ( - ) mood change, ( - ) new changes  All other systems were reviewed with the patient and are negative.  PHYSICAL EXAMINATION:  There were no vitals filed for this visit.   There were no vitals filed for this visit.    GENERAL: Well-appearing elderly African-American female, alert, no distress and comfortable SKIN: skin color, texture, turgor are normal, no rashes or significant lesions EYES: conjunctiva are pink and non-injected, sclera clear LUNGS: clear to auscultation and percussion with normal breathing effort HEART: regular rate & rhythm and no murmurs and no lower extremity edema Musculoskeletal: no cyanosis of digits and no clubbing  PSYCH: alert & oriented x 3, fluent speech NEURO: no focal motor/sensory deficits  LABORATORY DATA:  I have reviewed the data as listed    Latest Ref Rng & Units 02/16/2023    1:19 PM 11/16/2022    4:13 PM 08/11/2022    9:41 AM  CBC  WBC 4.0 - 10.5 K/uL 7.8  8.1  6.9   Hemoglobin 12.0 - 15.0 g/dL 16.1  09.6  04.5   Hematocrit 36.0 - 46.0 % 34.4  39.1  32.5   Platelets 150 - 400 K/uL 205  221.0  179        Latest Ref Rng & Units 02/16/2023    1:19 PM 11/16/2022    4:13 PM 08/11/2022    9:41 AM  CMP  Glucose 70 - 99 mg/dL 409  811  86   BUN 8 - 23 mg/dL 31  13  24    Creatinine 0.44 - 1.00 mg/dL 9.14  7.82  9.56   Sodium 135 - 145 mmol/L 134  135  138   Potassium 3.5 - 5.1 mmol/L 4.4  4.0  4.3   Chloride 98 - 111 mmol/L 99  100  104  CO2 22 - 32 mmol/L 29  31  28    Calcium 8.9 - 10.3 mg/dL 29.5  62.1  9.6   Total Protein 6.5 - 8.1 g/dL 8.1  8.7  7.9   Total Bilirubin 0.0 - 1.2 mg/dL 0.4  0.4  0.4   Alkaline Phos 38 - 126 U/L 58  68  63   AST 15 - 41 U/L 16  15  14    ALT 0 - 44 U/L 10  10  11      Lab Results  Component Value Date   MPROTEIN 1.5 (H) 02/16/2023   MPROTEIN 1.6 (H) 08/11/2022   MPROTEIN 1.5 (H) 05/12/2022   Lab Results  Component Value Date   KPAFRELGTCHN 76.8 (H) 02/16/2023    KPAFRELGTCHN 86.5 (H) 08/11/2022   KPAFRELGTCHN 76.7 (H) 05/12/2022   LAMBDASER 7.2 02/16/2023   LAMBDASER 9.0 08/11/2022   LAMBDASER 11.7 05/12/2022   KAPLAMBRATIO 10.67 (H) 02/16/2023   KAPLAMBRATIO 9.61 (H) 08/11/2022   KAPLAMBRATIO 6.56 (H) 05/12/2022     RADIOGRAPHIC STUDIES: No results found.  ASSESSMENT & PLAN Diane Daugherty is a 83 y.o. female with medical history significant for smoldering multiple myeloma who presents for a follow up visit.  #Smoldering Multiple Myeloma -- The patient currently meets one of the three 20-2-20 criteria (bmbx with 20% plasma cells). Two criteria are required for treatment.  --labs from 02/16/2023 reviewed with patient. WBC 7.8, Hgb 11.8, MCV 94.8, Plt 205. Myeloma labs show stable M protein measuring 1.5 g/dL. Kappa light chain elevated at 76.8, lambda light chain 7.2, ratio 10.67 -- No clear indication for treatment as long as the patient does not meet this criteria or any of the CRAB criteria --Continue on observation. We will order SPEP, UPEP, serum free light chains, LDH, CMP and CBC with each visit.  --Most recent bone scan from 02/18/2022 showed no definitive lytic bone lesion. Next scan due at next visit.  --Recommend follow-up visit in 6 months time   No orders of the defined types were placed in this encounter.  All questions were answered. The patient knows to call the clinic with any problems, questions or concerns.  I have spent a total of 25 minutes minutes of face-to-face and non-face-to-face time, preparing to see the patient,performing a medically appropriate examination, counseling and educating the patient,documenting clinical information in the electronic health record, and care coordination.   Ulysees Barns, MD Department of Hematology/Oncology [facility] Phone: [phone] Pager: [phone] Email: [email or web address]    02/21/2023 9:30 PM

## 2023-02-22 ENCOUNTER — Inpatient Hospital Stay (HOSPITAL_BASED_OUTPATIENT_CLINIC_OR_DEPARTMENT_OTHER): Payer: HMO | Admitting: Hematology and Oncology

## 2023-02-22 VITALS — BP 163/72 | HR 97 | Temp 98.0°F | Resp 16 | Wt 219.4 lb

## 2023-02-22 DIAGNOSIS — D472 Monoclonal gammopathy: Secondary | ICD-10-CM

## 2023-02-22 DIAGNOSIS — C9000 Multiple myeloma not having achieved remission: Secondary | ICD-10-CM | POA: Diagnosis not present

## 2023-02-23 ENCOUNTER — Ambulatory Visit: Payer: HMO

## 2023-02-23 DIAGNOSIS — M6281 Muscle weakness (generalized): Secondary | ICD-10-CM

## 2023-02-23 DIAGNOSIS — M542 Cervicalgia: Secondary | ICD-10-CM | POA: Diagnosis not present

## 2023-02-23 DIAGNOSIS — R293 Abnormal posture: Secondary | ICD-10-CM

## 2023-02-23 DIAGNOSIS — R252 Cramp and spasm: Secondary | ICD-10-CM

## 2023-02-23 NOTE — Therapy (Signed)
OUTPATIENT PHYSICAL THERAPY CERVICAL TREATMENT NOTE   Patient Name: STEPHENIE Daugherty MRN: [id number] DOB:Dec 28, 1940, 83 y.o., female Today's Date: 02/23/2023  END OF SESSION:  PT End of Session - 02/23/23 1018     Visit Number 9    Date for PT Re-Evaluation 03/08/23    Authorization Type [employer]    PT Start Time 1018    PT Stop Time 1058    PT Time Calculation (min) 40 min    Activity Tolerance Patient tolerated treatment well    Behavior During Therapy WFL for tasks assessed/performed             Past Medical History:  Diagnosis Date   Anemia 09/24/1968   "after childbirth"   Arthritis    "feels like all over"   Carpal tunnel syndrome    Carpal tunnel syndrome    left   Cataract    immature on right   Chronic bronchitis (HCC)    "get it most years" (10/08/2014)   Chronic kidney disease    stage III, per Pt   Family history of anesthesia complication    daughter gets sick   History of blood transfusion    "w/one of my back OR's"   Hypertension    takes Hyzaar daily   Joint pain    "all over"   Joint swelling    Muscle spasms of lower extremity    takes Baclofen daily prn   Nocturia    OSA on CPAP    Peripheral vascular disease (HCC)    PFO (patent foramen ovale)    positive saline bubble study 05/12/09 ([facility])   Pneumonia 2010/ 2011   PONV (postoperative nausea and vomiting)    woke up during hysterectomy   Shortness of breath    with exertion;Albuterol prn   Urinary frequency    Past Surgical History:  Procedure Laterality Date   ABDOMINAL HYSTERECTOMY  1970's?   APPENDECTOMY     BACK SURGERY     BREAST BIOPSY Right 1990's   CESAREAN SECTION  1962; 1963; 1970   CHEST TUBE INSERTION  80's   d/t pneumothorax   COLONOSCOPY     FRACTURE SURGERY     HARDWARE REMOVAL Right 1990's   "took screw out of my lower leg ~ 1 yr after putting it in"   HEMILAMINOTOMY LUMBAR SPINE  02/2002   L5; decompression of the L5 and S1 nerve root; synovial  cyst excision/notes 06/09/2010   KNEE ARTHROSCOPY Right ?2013   LUMBAR LAMINECTOMY/DECOMPRESSION MICRODISCECTOMY  02/2010   POSTERIOR LUMBAR FUSION  05/2003   L4-5 and S-1    TIBIA FRACTURE SURGERY Right 1990's   "put screw in"   TOTAL KNEE ARTHROPLASTY Right 08/28/2012   Procedure: TOTAL KNEE ARTHROPLASTY;  Surgeon: Valeria Batman, MD;  Location: [facility] OR;  Service: Orthopedics;  Laterality: Right;   TOTAL KNEE ARTHROPLASTY Left 10/07/2014   TOTAL KNEE ARTHROPLASTY Left 10/07/2014   Procedure: TOTAL KNEE ARTHROPLASTY;  Surgeon: Valeria Batman, MD;  Location: [facility] OR;  Service: Orthopedics;  Laterality: Left;   TOTAL KNEE REVISION Left 10/08/2021   Procedure: LEFT TOTAL KNEE REVISION;  Surgeon: Kathryne Hitch, MD;  Location: [facility] ORS;  Service: Orthopedics;  Laterality: Left;   Patient Active Problem List   Diagnosis Date Noted   Persistent headaches 12/20/2022   Neck pain 11/17/2022   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   Status post revision of total replacement of left knee 10/08/2021   Failed total left knee replacement (HCC)  10/07/2021   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   Stage 3a chronic kidney disease (HCC) 05/27/2021   History of multiple myeloma 05/27/2021   Primary osteoarthritis of left knee 10/07/2014   History of left knee replacement 10/07/2014   Asthma, chronic 08/30/2012   Essential hypertension 08/30/2012   Osteoarthritis of right knee 08/30/2012   OSA on CPAP 08/30/2012   Severe obesity (BMI >= 40) (HCC) 08/30/2012    PCP: [name], MD  REFERRING PROVIDER: [name], MD  REFERRING DIAG: M54.2 (ICD-10-CM) - Cervicalgia  THERAPY DIAG:  Cervicalgia  Abnormal posture  Cramp and spasm  Muscle weakness (generalized)  Rationale for Evaluation and Treatment: Rehabilitation  ONSET DATE: 12/29/2022  SUBJECTIVE:                                                                                                                                                                                                          SUBJECTIVE STATEMENT: The headaches had disappeared but this morning I woke up with some aching at the base of my skull.  Offered DN but patient declined.   EVAL: Patient reports pain since August 24th.  She woke up with her neck stiff and it never went away.  She's also started having headaches.  She is taking a muscle relaxer and neuro prescribed something for her headaches.  She was not able to take the medication from the neuro doc.  This made her confused and nauseated.  She is retired and does just basics around the house.  Her usual housework including.  Hand dominance: Right  PERTINENT HISTORY:  Lumbar fusion several years ago  PAIN:  02/23/23 Are you having pain? Yes: NPRS scale: 1-2/10 Pain location: right back of head and temple but not present at the moment Pain description: aching but occasionally sharp Aggravating factors: taking a shower, getting dressed or bending over, laying on pillow Relieving factors: rest, pillow propping  PRECAUTIONS: None  RED FLAGS: None     WEIGHT BEARING RESTRICTIONS: No  FALLS:  Has patient fallen in last 6 months? 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  DIAGNOSTIC FINDINGS:  No specif  PATIENT SURVEYS:  FOTO 50, predicted 42  COGNITION: Overall cognitive status: Within functional limits for tasks  assessed  SENSATION: WFL  POSTURE: rounded shoulders and forward head  PALPATION: Taut bands and trigger point bilateral upper traps, levators and rhomboids   CERVICAL ROM:   Active ROM A/PROM (deg) eval 1/14 1/28  Flexion 32 62 65  Extension 40 41 45  Right lateral flexion 35 40 45  Left lateral flexion 38 46 45  Right rotation 50 65 65  Left rotation 60 65 65   (Blank rows = not tested)  UPPER EXTREMITY ROM:  1/14: able to touch back of the head easily with both hands (arms get tired with sustained hold)   UPPER EXTREMITY MMT:  Generally   CERVICAL SPECIAL TESTS:  Spurling's test: Positive and Distraction test: Positive  TODAY'S TREATMENT:    DATE: 02/23/23 UBE x 6 min  3/3 PT present to discuss status MELT with blue foam roller : rotations x 20, nods x 20, figure 8 x 10 each side Seated shoulder rolls x20 Seated rows with red tband with handles 2 x 10 (PT anchoring mid band) Seated shoulder horizontal abduction  with red band 2 x 10 Sit to stand with 4# plyoball overhead press 5x Seated 4# plyoball core series: hip to hip, shoulder to hip,  10 x each side Seated punches with 1 lb dumbbells x 20 (10 each arm) Seated steam engines overhead with 1 lb x 20 (10 each UE)  DATE: 02/21/23 UBE x 3 min  PT present to discuss status Seated 4# plyoball core series: hip to hip, shoulder to hip,  10 x each side Sit to stand with 4# plyoball overhead press 5x Standing modified dead lifts to tall cone holding 5 pound kettlebell 10x with verbal cues for forward gaze for neutral spine while hip hinging   Wall push ups 10x; side to side push ups 5x Back to the wall posture ex's with head against folded towel:  yellow band elevation  x 10, horizontal abduction x 10, sash x 5 each side, external rotation 10x each Resisted cable walk backwards using handles for isometric UE hold 10# 10x Seated cervical extension isometric pink ball press to the wall 5 sec holds 10x Seated cervical  rotation with pink ball press to the wall 10x Seated cervical nods with pink ball on the wall 10x  DATE: 02/16/23 Nustep seat 7, arms 8: blue machine (limited knee ROM limited)  10 min L3   Seated 1# punches 10x Seated 4# plyoball core series: hip to hip, shoulder to hip,  ear to ear 10 x each side Seated blue band  upward row (cord anchored under feet) 2 x 10  Wall push ups 10x; side to side push ups 5x Back to the wall posture ex's:  chin retraction x 20, yellow band elevation 2 x 10, horizontal abduction x 10, sash x 5 each side, external rotation 5x each Community ex options: pt really likes the [employer] ([employer], [place], [employer] pt lives near here) Back to the wall: head press against folded towel with palm press to wall 10x 5 sec holds    PATIENT EDUCATION:  Education details: Initiated HEP Person educated: Patient Education method: Programmer, multimedia, Facilities manager, Verbal cues, and Handouts Education comprehension: verbalized understanding, returned demonstration, and verbal cues required  HOME EXERCISE PROGRAM: Access Code: [email or web address] URL: [email or web address] Date: 01/31/2023 Prepared by: Lavinia Sharps  Exercises - Shoulder Rolls in Sitting  -  1 x daily - 7 x weekly - 1 sets - 10 reps - Seated Cervical Retraction  - 1 x daily - 7 x weekly - 1 sets - 10 reps - Seated Cervical Rotation AROM  - 1 x daily - 7 x weekly - 1 sets - 10 reps - Seated Cervical Sidebending AROM  - 1 x daily - 7 x weekly - 1 sets - 10 reps - Doorway Pec Stretch at 90 Degrees Abduction  - 1 x daily - 7 x weekly - 1 sets - 10 reps - 10 sec hold - Wrist Extension Stretch at Wall  - 1 x daily - 7 x weekly - 1 sets - 10 reps - 10 sec hold - Seated Assisted Cervical Rotation with Towel  - 1 x daily - 7 x weekly - 1 sets - 10 reps - Seated Thoracic Lumbar Extension with Pectoralis Stretch  - 1 x daily - 7 x weekly - 1 sets - 10 reps - Standing Bilateral Low Shoulder Row with Anchored Resistance  - 1 x  daily - 7 x weekly - 1 sets - 10 reps - Shoulder extension with resistance - Neutral  - 1 x daily - 7 x weekly - 1 sets - 10 reps - Wall Push Up  - 1 x daily - 7 x weekly - 1 sets - 10 reps  ASSESSMENT:  CLINICAL IMPRESSION:   Kialee had a slight return of suboccipital pain upon waking this morning but this resolved easily with MELT.  She is tolerating a much higher level of strengthening compared to initial few visits.  She is approaching DC and should meet final goals by this time.  She is compliant and would benefit from continued skilled PT for postural strengthening and C spine ROM.     OBJECTIVE IMPAIRMENTS: decreased ROM, decreased strength, increased fascial restrictions, increased muscle spasms, impaired flexibility, impaired UE functional use, postural dysfunction, and pain.   ACTIVITY LIMITATIONS: carrying, lifting, sleeping, transfers, bed mobility, bathing, toileting, dressing, reach over head, hygiene/grooming, and caring for others  PARTICIPATION LIMITATIONS: meal prep, cleaning, laundry, driving, shopping, community activity, and church  PERSONAL FACTORS: Age, Fitness, and 1-2 comorbidities: CKD and chronic bronchitis  are also affecting patient's functional outcome.   REHAB POTENTIAL: Good  CLINICAL DECISION MAKING: Stable/uncomplicated  EVALUATION COMPLEXITY: Low   GOALS: Goals reviewed with patient? Yes  SHORT TERM GOALS: Target date: 02/08/2023   Pain report to be no greater than 4/10  Baseline:  Goal status: met 1/21  2.  Patient will be independent with initial HEP  Baseline:  Goal status: met 1/14  3.  Patient to be able to reach back of head for grooming with bilateral shoulders Baseline:  Goal status: met 1/14   LONG TERM GOALS: Target date: 03/08/2023  Patient to report pain no greater than 2/10  Baseline:  Goal status: MET 02/23/23  2.  Patient will be able to reach overhead into cabinets and on top of shelves without pain  Baseline:  Goal  status: In progress  3.  Patient to be able to sleep through the night  Baseline:  Goal status: MET 02/23/23  4.  Patient to be independent with advanced HEP  Baseline:  Goal status: In progress  5.  Patient to report 85% improvement in overall symptoms  Baseline:  Goal status: In progress  6.  Patient to be able to do all ADL's and IADL's without exacerbation of shoulder and neck pain Baseline:  Goal status:  MET 02/23/23   PLAN:  PT FREQUENCY: 1-2x/week  PT DURATION: 8 weeks  PLANNED INTERVENTIONS: 97110-Therapeutic exercises, 97530- Therapeutic activity, 97112- Neuromuscular re-education, 97535- Self Care, 91478- Manual therapy, [id number]- Gait training, [phone]- Aquatic Therapy, [id number]- Electrical stimulation (unattended), [id number]- Electrical stimulation (manual), [id number]- Ultrasound, [id number]- Traction (mechanical), [id number]- Ionotophoresis 4mg /ml Dexamethasone, Patient/Family education, Taping, Dry Needling, Spinal mobilization, Scar mobilization, Cryotherapy, and Moist heat  PLAN FOR NEXT SESSION:   decrease treatment frequency or discharge in 2-3 visits; Nustep (let pt set herself up) discussed , manual techniques, postural stability, hip and LE strength   Atiya Yera B. Nura Cahoon, PT 02/23/23 11:01 AM [facility] [address], [address] [place], [place] [address] Phone # [phone] Fax [phone]

## 2023-02-24 LAB — UPEP/UIFE/LIGHT CHAINS/TP, 24-HR UR
% BETA, Urine: 0 %
ALPHA 1 URINE: 0 %
Albumin, U: 100 %
Alpha 2, Urine: 0 %
Free Kappa Lt Chains,Ur: 4.55 mg/L (ref 1.17–86.46)
Free Kappa/Lambda Ratio: 4.29 (ref 1.83–14.26)
Free Lambda Lt Chains,Ur: 1.06 mg/L (ref 0.27–15.21)
GAMMA GLOBULIN URINE: 0 %
Total Protein, Urine-Ur/day: <92 mg/(24.h) (ref 30–150)
Total Protein, Urine: <4 mg/dL
Total Volume: 2300

## 2023-02-28 ENCOUNTER — Encounter: Payer: Self-pay | Admitting: Internal Medicine

## 2023-02-28 ENCOUNTER — Encounter: Payer: HMO | Admitting: Physical Therapy

## 2023-02-28 ENCOUNTER — Ambulatory Visit (INDEPENDENT_AMBULATORY_CARE_PROVIDER_SITE_OTHER): Payer: HMO | Admitting: Internal Medicine

## 2023-02-28 VITALS — BP 148/88 | HR 105 | Temp 98.5°F | Ht 61.0 in | Wt 219.0 lb

## 2023-02-28 DIAGNOSIS — J069 Acute upper respiratory infection, unspecified: Secondary | ICD-10-CM | POA: Diagnosis not present

## 2023-02-28 DIAGNOSIS — N1831 Chronic kidney disease, stage 3a: Secondary | ICD-10-CM

## 2023-02-28 DIAGNOSIS — I10 Essential (primary) hypertension: Secondary | ICD-10-CM | POA: Diagnosis not present

## 2023-02-28 DIAGNOSIS — J4520 Mild intermittent asthma, uncomplicated: Secondary | ICD-10-CM

## 2023-02-28 MED ORDER — DOXYCYCLINE HYCLATE 100 MG PO TABS: 100.0000 mg | ORAL_TABLET | Freq: Two times a day (BID) | ORAL | 0 refills | Status: DC

## 2023-02-28 MED ORDER — HYDROCODONE BIT-HOMATROP MBR 5-1.5 MG/5ML PO SOLN: 5.0000 mL | Freq: Four times a day (QID) | ORAL | 0 refills | Status: AC | PRN

## 2023-02-28 MED ORDER — VALSARTAN-HYDROCHLOROTHIAZIDE 320-12.5 MG PO TABS: 1.0000 | ORAL_TABLET | Freq: Every day | ORAL | Status: DC

## 2023-02-28 NOTE — Progress Notes (Signed)
 Patient ID: Diane Daugherty, female   DOB: July 29, 1940, 83 y.o.   MRN: [id number]        Chief Complaint: follow up sinusitis, htn, asthma       HPI:  Diane Daugherty is a 83 y.o. female  Here with 2-3 days acute onset fever, facial pain, pressure, headache, general weakness and malaise, and greenish d/c, with mild ST and cough, but pt denies chest pain, wheezing, increased sob or doe, orthopnea, PND, increased LE swelling, palpitations, dizziness or syncope.   Pt denies polydipsia, polyuria, or new focal neuro s/s.    Pt denies wt loss, night sweats, loss of appetite, or other constitutional symptoms        Wt Readings from Last 3 Encounters:  02/28/23 219 lb (99.3 kg)  02/22/23 219 lb 6.4 oz (99.5 kg)  01/11/23 216 lb (98 kg)   BP Readings from Last 3 Encounters:  02/28/23 (!) 148/88  02/22/23 (!) 163/72  01/11/23 (!) 168/82         Past Medical History:  Diagnosis Date   Anemia 09/24/1968   after childbirth   Arthritis    feels like all over   Carpal tunnel syndrome    Carpal tunnel syndrome    left   Cataract    immature on right   Chronic bronchitis (HCC)    get it most years (10/08/2014)   Chronic kidney disease    stage III, per Pt   Family history of anesthesia complication    daughter gets sick   History of blood transfusion    w/one of my back OR's   Hypertension    takes Hyzaar daily   Joint pain    all over   Joint swelling    Muscle spasms of lower extremity    takes Baclofen  daily prn   Nocturia    OSA on CPAP    Peripheral vascular disease (HCC)    PFO (patent foramen ovale)    positive saline bubble study 05/12/09 ([facility])   Pneumonia 2010/ 2011   PONV (postoperative nausea and vomiting)    woke up during hysterectomy   Shortness of breath    with exertion;Albuterol  prn   Urinary frequency    Past Surgical History:  Procedure Laterality Date   ABDOMINAL HYSTERECTOMY  1970's?   APPENDECTOMY     BACK SURGERY     BREAST BIOPSY Right 1990's    CESAREAN SECTION  1962; 1963; 1970   CHEST TUBE INSERTION  80's   d/t pneumothorax   COLONOSCOPY     FRACTURE SURGERY     HARDWARE REMOVAL Right 1990's   took screw out of my lower leg ~ 1 yr after putting it in   HEMILAMINOTOMY LUMBAR SPINE  02/2002   L5; decompression of the L5 and S1 nerve root; synovial cyst excision/notes 06/09/2010   KNEE ARTHROSCOPY Right ?2013   LUMBAR LAMINECTOMY/DECOMPRESSION MICRODISCECTOMY  02/2010   POSTERIOR LUMBAR FUSION  05/2003   L4-5 and S-1    TIBIA FRACTURE SURGERY Right 1990's   put screw in   TOTAL KNEE ARTHROPLASTY Right 08/28/2012   Procedure: TOTAL KNEE ARTHROPLASTY;  Surgeon: Maude LELON Right, MD;  Location[facility] OR;  Service: Orthopedics;  Laterality: Right;   TOTAL KNEE ARTHROPLASTY Left 10/07/2014   TOTAL KNEE ARTHROPLASTY Left 10/07/2014   Procedure: TOTAL KNEE ARTHROPLASTY;  Surgeon: Maude LELON Right, MD;  Location: [facility] OR;  Service: Orthopedics;  Laterality: Left;   TOTAL KNEE REVISION Left 10/08/2021  Procedure: LEFT TOTAL KNEE REVISION;  Surgeon: Vernetta Lonni GRADE, MD;  Location: [facility] ORS;  Service: Orthopedics;  Laterality: Left;    reports that she has never smoked. She has never used smokeless tobacco. She reports that she does not drink alcohol  and does not use drugs. family history is not on file. Allergies  Allergen Reactions   Levaquin [Levofloxacin] Swelling   Morphine  And Codeine Nausea Only   Other Nausea And Vomiting    Darvocet-vomiting   Oxycodone Nausea Only   Tramadol  Other (See Comments)    And dizziness   Naprosyn [Naproxen] Itching and Rash   Current Outpatient Medications on File Prior to Visit  Medication Sig Dispense Refill   acetaminophen  (TYLENOL ) 500 MG tablet Take 500 mg by mouth every 6 (six) hours as needed.     baclofen  (LIORESAL ) 10 MG tablet Take 1 tablet (10 mg total) by mouth 3 (three) times daily as needed for muscle spasms. 20 tablet 0   Calcium Citrate-Vitamin D (CALCIUM CITRATE + D PO)  Take 1 tablet by mouth daily.     CHELATED IRON PO Take 27 mg by mouth daily.     fluticasone  (FLONASE ) 50 MCG/ACT nasal spray Place 2 sprays into both nostrils daily as needed for allergies or rhinitis. 16 g 1   Krill Oil 300 MG CAPS Take 300 mg by mouth daily.     Multiple Vitamins-Minerals (CENTRUM SILVER 50+WOMEN PO) Take 1 tablet by mouth daily.     amLODipine  (NORVASC ) 5 MG tablet Take 1 tablet (5 mg total) by mouth daily. 90 tablet 3   HYDROcodone -acetaminophen  (NORCO) 5-325 MG tablet Take 1 tablet by mouth every 6 (six) hours as needed for moderate pain (pain score 4-6). (Patient not taking: Reported on 02/28/2023) 15 tablet 0   nortriptyline  (PAMELOR ) 10 MG capsule Take 2 capsules (20 mg total) by mouth at bedtime. Take 1 capsule (10 mg) at bedtime for 1 week, then increase to 2 capsules (20 mg) at bedtime thereafter (Patient not taking: Reported on 02/28/2023) 60 capsule 4   No current facility-administered medications on file prior to visit.        ROS:  All others reviewed and negative.  Objective        PE:  BP (!) 148/88 (BP Location: Right Arm, Patient Position: Sitting, Cuff Size: Normal)   Pulse (!) 105   Temp 98.5 F (36.9 C) (Oral)   Ht 5' 1 (1.549 m)   Wt 219 lb (99.3 kg)   SpO2 98%   BMI 41.38 kg/m                 Constitutional: Pt appears mild ill               HENT: Head: NCAT.                Right Ear: External ear normal.                 Left Ear: External ear normal. Bilat tm's with mild erythema.  Max sinus areas mild tender.  Pharynx with mild erythema, no exudate                Eyes: . Pupils are equal, round, and reactive to light. Conjunctivae and EOM are normal               Nose: without d/c or deformity               Neck: Neck supple. Sheldon  normal ROM               Cardiovascular: Normal rate and regular rhythm.                 Pulmonary/Chest: Effort normal and breath sounds without rales but with few mild bilateral wheezing.                                Neurological: Pt is alert. At baseline orientation, motor grossly intact               Skin: Skin is warm. No rashes, no other new lesions, LE edema - none               Psychiatric: Pt behavior is normal without agitation   Micro: none  Cardiac tracings I have personally interpreted today:  none  Pertinent Radiological findings (summarize): none   Lab Results  Component Value Date   WBC 7.8 02/16/2023   HGB 11.8 (L) 02/16/2023   HCT 34.4 (L) 02/16/2023   PLT 205 02/16/2023   GLUCOSE 115 (H) 02/16/2023   ALT 10 02/16/2023   AST 16 02/16/2023   NA 134 (L) 02/16/2023   K 4.4 02/16/2023   CL 99 02/16/2023   CREATININE 1.36 (H) 02/16/2023   BUN 31 (H) 02/16/2023   CO2 29 02/16/2023   TSH 1.17 12/29/2022   INR 1.13 09/25/2014   Assessment/Plan:  Diane Daugherty is a 83 y.o. Black or African American [2] female with  has a past medical history of Anemia (09/24/1968), Arthritis, Carpal tunnel syndrome, Carpal tunnel syndrome, Cataract, Chronic bronchitis (HCC), Chronic kidney disease, Family history of anesthesia complication, History of blood transfusion, Hypertension, Joint pain, Joint swelling, Muscle spasms of lower extremity, Nocturia, OSA on CPAP, Peripheral vascular disease (HCC), PFO (patent foramen ovale), Pneumonia (2010/ 2011), PONV (postoperative nausea and vomiting), Shortness of breath, and Urinary frequency.  Asthma, chronic Stable overall, cont inhaler prn  Essential hypertension BP Readings from Last 3 Encounters:  02/28/23 (!) 148/88  02/22/23 (!) 163/72  01/11/23 (!) 168/82   Uncontrolled, ? Reactive vs essential, pt to continue medical treatment norvsc 5 every day, diovan  hct 320 121.5 every day, as declines change today   Stage 3a chronic kidney disease (HCC) Lab Results  Component Value Date   CREATININE 1.36 (H) 02/16/2023   Stable overall, cont to avoid nephrotoxins   Acute upper respiratory infection Mild to mod, for antibx course -  doxycycline  100 bid, cough me dprn,,  to f/u any worsening symptoms or concerns Followup: Return if symptoms worsen or fail to improve.  Lynwood Rush, MD 03/04/2023 9:26 PM  Medical [facility]

## 2023-02-28 NOTE — Patient Instructions (Signed)
Please take all new medication as prescribed  Please continue all other medications as before, and refills have been done if requested.  Please have the pharmacy call with any other refills you may need.  Please keep your appointments with your specialists as you may have planned     

## 2023-03-01 ENCOUNTER — Ambulatory Visit: Payer: PRIVATE HEALTH INSURANCE | Admitting: Emergency Medicine

## 2023-03-01 ENCOUNTER — Ambulatory Visit (HOSPITAL_COMMUNITY): Payer: HMO

## 2023-03-03 ENCOUNTER — Telehealth: Payer: Self-pay | Admitting: *Deleted

## 2023-03-03 NOTE — Telephone Encounter (Signed)
 TCT patient regarding recent lab results and bone survey. Spoke with her. Advised that her smoldering multiple myeloma labs and bone survey are stable. There is no evidence of progression of disease. We will continue to monitor at this time. We will see her back as scheduled in July 2025. Pt voiced understanding. She states she feels well at this time. No questions or concerns. She is aware of her appt in July 2025

## 2023-03-03 NOTE — Telephone Encounter (Signed)
-----   Message from Norleen ONEIDA Kidney IV sent at 03/02/2023  3:39 PM EST ----- Please let Diane Daugherty know that her smoldering multiple myeloma labs and bone survey are stable.  There is no evidence of progression of disease.  We will continue to monitor at this time.  We will see her back as scheduled in July 2025. ----- Message ----- From: Neomi Johnston ONEIDA DEVONNA Sent: 02/27/2023   4:17 PM EST To: Norleen ONEIDA Kidney [place], MD  Patient you saw on 1/29 ----- Message ----- From: Rebecka, Rad Results In Sent: 02/26/2023  10:45 PM EST To: Johnston ONEIDA Neomi, PA-C

## 2023-03-04 ENCOUNTER — Encounter: Payer: Self-pay | Admitting: Internal Medicine

## 2023-03-04 DIAGNOSIS — J069 Acute upper respiratory infection, unspecified: Secondary | ICD-10-CM | POA: Insufficient documentation

## 2023-03-04 NOTE — Assessment & Plan Note (Signed)
Stable overall, cont inhaler prn 

## 2023-03-04 NOTE — Assessment & Plan Note (Signed)
 Lab Results  Component Value Date   CREATININE 1.36 (H) 02/16/2023   Stable overall, cont to avoid nephrotoxins

## 2023-03-04 NOTE — Assessment & Plan Note (Signed)
 BP Readings from Last 3 Encounters:  02/28/23 (!) 148/88  02/22/23 (!) 163/72  01/11/23 (!) 168/82   Uncontrolled, ? Reactive vs essential, pt to continue medical treatment norvsc 5 every day, diovan  hct 320 121.5 every day, as declines change today

## 2023-03-04 NOTE — Assessment & Plan Note (Signed)
Mild to mod, for antibx course doxycycline 100 bid, cough med prn,,  to f/u any worsening symptoms or concerns  

## 2023-03-07 ENCOUNTER — Encounter: Payer: HMO | Admitting: Physical Therapy

## 2023-03-08 ENCOUNTER — Encounter: Payer: Self-pay | Admitting: Emergency Medicine

## 2023-03-08 ENCOUNTER — Ambulatory Visit (INDEPENDENT_AMBULATORY_CARE_PROVIDER_SITE_OTHER): Payer: HMO | Admitting: Emergency Medicine

## 2023-03-08 ENCOUNTER — Ambulatory Visit: Payer: HMO | Admitting: Emergency Medicine

## 2023-03-08 VITALS — BP 154/84 | HR 92 | Temp 98.1°F | Ht 61.0 in | Wt 218.0 lb

## 2023-03-08 DIAGNOSIS — R6889 Other general symptoms and signs: Secondary | ICD-10-CM | POA: Insufficient documentation

## 2023-03-08 DIAGNOSIS — B349 Viral infection, unspecified: Secondary | ICD-10-CM | POA: Diagnosis not present

## 2023-03-08 NOTE — Assessment & Plan Note (Signed)
Clinically stable.  No red flag signs or symptoms. Running its course without complications Symptom management discussed Advised to rest and stay well-hydrated ED precautions given Advised to contact the office if no better or worse during the next several days

## 2023-03-08 NOTE — Progress Notes (Signed)
Diane Daugherty 83 y.o.   Chief Complaint  Patient presents with   URI    Patient states she saw [name] last Tuesday and ws given a abx and cough meds.wasn't tested for flu/covid/rsv. She states it hasn't been working. It will feel like she's getting a little better but then its back to the same symptom. States she has pressure in her head and nose, eyes burning, no fever. She also states she hears an echo in her ears/ head. All her symptoms started 02/25/23    HISTORY OF PRESENT ILLNESS: Acute problem visit today. This is a 83 y.o. female complaining of flulike symptoms that started about 12 days ago Was seen by [name] last Tuesday and was started on an antibiotic and cough medicine Feels better but symptoms come and go Not coughing.  Mostly complaining of runny nose and congestion with headache and lack of energy No other associated symptoms No other complaints or medical concerns today.   URI  Associated symptoms include congestion. Pertinent negatives include no abdominal pain, chest pain, coughing, diarrhea, dysuria, headaches, nausea, rash or vomiting.     Prior to Admission medications   Medication Sig Start Date End Date Taking? Authorizing Provider  acetaminophen (TYLENOL) 500 MG tablet Take 500 mg by mouth every 6 (six) hours as needed.   Yes [provider]  amLODipine (NORVASC) 5 MG tablet Take 1 tablet (5 mg total) by mouth daily. 02/28/22 03/08/23 Yes Diane Daugherty, Eilleen Kempf, MD  baclofen (LIORESAL) 10 MG tablet Take 1 tablet (10 mg total) by mouth 3 (three) times daily as needed for muscle spasms. 01/11/23  Yes Diane Daugherty, Eilleen Kempf, MD  Calcium Citrate-Vitamin D (CALCIUM CITRATE + D PO) Take 1 tablet by mouth daily.   Yes [provider]  CHELATED IRON PO Take 27 mg by mouth daily.   Yes [provider]  doxycycline (VIBRA-TABS) 100 MG tablet Take 1 tablet (100 mg total) by mouth 2 (two) times daily. 02/28/23  Yes Diane Levins, MD  fluticasone  [facility]) 50 MCG/ACT nasal spray Place 2 sprays into both nostrils daily as needed for allergies or rhinitis. 05/27/21  Yes Diane Daugherty, Eilleen Kempf, MD  Krill Oil 300 MG CAPS Take 300 mg by mouth daily.   Yes [provider]  Multiple Vitamins-Minerals (CENTRUM SILVER 50+WOMEN PO) Take 1 tablet by mouth daily.   Yes [provider]  valsartan-hydrochlorothiazide (DIOVAN-HCT) 320-12.5 MG tablet Take 1 tablet by mouth daily. 02/28/23  Yes Diane Levins, MD  HYDROcodone bit-homatropine [facility]) 5-1.5 MG/5ML syrup Take 5 mLs by mouth every 6 (six) hours as needed for up to 10 days. Patient not taking: Reported on 03/08/2023 02/28/23 03/10/23  Diane Levins, MD  HYDROcodone-acetaminophen [facility]) 5-325 MG tablet Take 1 tablet by mouth every 6 (six) hours as needed for moderate pain (pain score 4-6). Patient not taking: Reported on 03/08/2023 12/20/22   Diane Quint, MD  nortriptyline (PAMELOR) 10 MG capsule Take 2 capsules (20 mg total) by mouth at bedtime. Take 1 capsule (10 mg) at bedtime for 1 week, then increase to 2 capsules (20 mg) at bedtime thereafter Patient not taking: Reported on 03/08/2023 12/29/22   Diane Madura, MD    Allergies  Allergen Reactions   Levaquin [Levofloxacin] Swelling   Morphine And Codeine Nausea Only   Other Nausea And Vomiting    Darvocet-vomiting   Oxycodone Nausea Only   Tramadol Other (See Comments)    And dizziness   Naprosyn [  Naproxen] Itching and Rash    Patient Active Problem List   Diagnosis Date Noted   Acute upper respiratory infection 03/04/2023   Persistent headaches 12/20/2022   Neck pain 11/17/2022   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   Status post revision of total replacement of left knee 10/08/2021   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   Stage 3a chronic kidney disease (HCC) 05/27/2021   History of multiple myeloma 05/27/2021   Primary osteoarthritis of left knee 10/07/2014    History of left knee replacement 10/07/2014   Asthma, chronic 08/30/2012   Essential hypertension 08/30/2012   Osteoarthritis of right knee 08/30/2012   OSA on CPAP 08/30/2012   Severe obesity (BMI >= 40) (HCC) 08/30/2012    Past Medical History:  Diagnosis Date   Anemia 09/24/1968   "after childbirth"   Arthritis    "feels like all over"   Carpal tunnel syndrome    Carpal tunnel syndrome    left   Cataract    immature on right   Chronic bronchitis (HCC)    "get it most years" (10/08/2014)   Chronic kidney disease    stage III, per Pt   Family history of anesthesia complication    daughter gets sick   History of blood transfusion    "w/one of my back OR's"   Hypertension    takes Hyzaar daily   Joint pain    "all over"   Joint swelling    Muscle spasms of lower extremity    takes Baclofen daily prn   Nocturia    OSA on CPAP    Peripheral vascular disease (HCC)    PFO (patent foramen ovale)    positive saline bubble study 05/12/09 ([place])   Pneumonia 2010/ 2011   PONV (postoperative nausea and vomiting)    woke up during hysterectomy   Shortness of breath    with exertion;Albuterol prn   Urinary frequency     Past Surgical History:  Procedure Laterality Date   ABDOMINAL HYSTERECTOMY  1970's?   APPENDECTOMY     BACK SURGERY     BREAST BIOPSY Right 1990's   CESAREAN SECTION  1962; 1963; 1970   CHEST TUBE INSERTION  80's   d/t pneumothorax   COLONOSCOPY     FRACTURE SURGERY     HARDWARE REMOVAL Right 1990's   "took screw out of my lower leg ~ 1 yr after putting it in"   HEMILAMINOTOMY LUMBAR SPINE  02/2002   L5; decompression of the L5 and S1 nerve root; synovial cyst excision/notes 06/09/2010   KNEE ARTHROSCOPY Right ?2013   LUMBAR LAMINECTOMY/DECOMPRESSION MICRODISCECTOMY  02/2010   POSTERIOR LUMBAR FUSION  05/2003   L4-5 and S-1    TIBIA FRACTURE SURGERY Right 1990's   "put screw in"   TOTAL KNEE ARTHROPLASTY Right 08/28/2012   Procedure: TOTAL KNEE  ARTHROPLASTY;  Surgeon: Valeria Batman, MD;  Location: [facility];  Service: Orthopedics;  Laterality: Right;   TOTAL KNEE ARTHROPLASTY Left 10/07/2014   TOTAL KNEE ARTHROPLASTY Left 10/07/2014   Procedure: TOTAL KNEE ARTHROPLASTY;  Surgeon: Valeria Batman, MD;  Location: [facility] OR;  Service: Orthopedics;  Laterality: Left;   TOTAL KNEE REVISION Left 10/08/2021   Procedure: LEFT TOTAL KNEE REVISION;  Surgeon: Kathryne Hitch, MD;  Location: [facility] ORS;  Service: Orthopedics;  Laterality: Left;    Social History   Socioeconomic History   Marital status: Divorced    Spouse name: Not on file  Number of children: Not on file   Years of education: Not on file   Highest education level: 12th grade  Occupational History   Not on file  Tobacco Use   Smoking status: Never   Smokeless tobacco: Never  Vaping Use   Vaping status: Never Used  Substance and Sexual Activity   Alcohol use: No   Drug use: No   Sexual activity: Not Currently  Other Topics Concern   Not on file  Social History Narrative   Are you right handed or left handed? Right   Are you currently employed ?    What is your current occupation? Retired/disability   Do you live at home alone?   Who lives with you? daughter   What type of home do you live in: 1 story or 2 story? two   Caffeine I in am    Social Drivers of Health   Financial Resource Strain: Low Risk  (01/07/2023)   Overall Financial Resource Strain (CARDIA)    Difficulty of Paying Living Expenses: Not very hard  Food Insecurity: No Food Insecurity (01/07/2023)   Hunger Vital Sign    Worried About Running Out of Food in the Last Year: Never true    Ran Out of Food in the Last Year: Never true  Transportation Needs: No Transportation Needs (01/07/2023)   PRAPARE - Administrator, Civil Service (Medical): No    Lack of Transportation (Non-Medical): No  Physical Activity: Insufficiently Active (01/07/2023)   Exercise Vital Sign    Days of  Exercise per Week: 2 days    Minutes of Exercise per Session: 20 min  Stress: Stress Concern Present (01/07/2023)   [employer] of Occupational Health - Occupational Stress Questionnaire    Feeling of Stress : To some extent  Social Connections: Moderately Isolated (01/07/2023)   Social Connection and Isolation Panel [NHANES]    Frequency of Communication with Friends and Family: Once a week    Frequency of Social Gatherings with Friends and Family: Once a week    Attends Religious Services: More than 4 times per year    Active Member of [employer] or Organizations: Yes    Attends Banker Meetings: More than 4 times per year    Marital Status: Divorced  Intimate Partner Violence: Not At Risk (04/12/2022)   Humiliation, Afraid, Rape, and Kick questionnaire    Fear of Current or Ex-Partner: No    Emotionally Abused: No    Physically Abused: No    Sexually Abused: No    No family history on file.   Review of Systems  Constitutional:  Positive for malaise/fatigue. Negative for chills and fever.  HENT:  Positive for congestion.   Respiratory:  Negative for cough.   Cardiovascular:  Negative for chest pain and palpitations.  Gastrointestinal:  Negative for abdominal pain, diarrhea, nausea and vomiting.  Genitourinary: Negative.  Negative for dysuria and hematuria.  Skin: Negative.  Negative for rash.  Neurological: Negative.  Negative for dizziness and headaches.  All other systems reviewed and are negative.   Vitals:   03/08/23 1314  BP: (!) 154/84  Pulse: 92  Temp: 98.1 F (36.7 C)  SpO2: 97%    Physical Exam Vitals reviewed.  Constitutional:      Appearance: Normal appearance.  HENT:     Head: Normocephalic.     Right Ear: Tympanic membrane, ear canal and external ear normal.     Left Ear: Tympanic membrane, ear canal and external  ear normal.     Nose: Congestion present.     Mouth/Throat:     Mouth: Mucous membranes are moist.     Pharynx: Oropharynx  is clear.  Eyes:     Extraocular Movements: Extraocular movements intact.     Conjunctiva/sclera: Conjunctivae normal.     Pupils: Pupils are equal, round, and reactive to light.  Cardiovascular:     Rate and Rhythm: Normal rate and regular rhythm.     Pulses: Normal pulses.     Heart sounds: Normal heart sounds.  Pulmonary:     Effort: Pulmonary effort is normal.     Breath sounds: Normal breath sounds.  Musculoskeletal:     Cervical back: No tenderness.  Lymphadenopathy:     Cervical: No cervical adenopathy.  Skin:    General: Skin is warm and dry.  Neurological:     Mental Status: She is alert and oriented to person, place, and time.  Psychiatric:        Mood and Affect: Mood normal.        Behavior: Behavior normal.      ASSESSMENT & PLAN: A total of 33 minutes was spent with the patient and counseling/coordination of care regarding preparing for this visit, review of most recent office visit notes, review of chronic medical conditions under management, review of all medications, diagnosis of viral illness and symptom management, prognosis, documentation and need for follow-up if no better or worse during the next several days.  Problem List Items Addressed This Visit       Other   Viral illness - Primary   Clinically stable.  No red flag signs or symptoms. Running its course without complications Symptom management discussed Advised to rest and stay well-hydrated ED precautions given Advised to contact the office if no better or worse during the next several days      Flu-like symptoms   Symptom management discussed May take Tylenol for headaches Stay away from NSAIDs Advised to rest and stay well-hydrated Contact the office if no better or worse during the next several days.      Patient Instructions  Viral Illness, Adult Viruses are tiny germs that can get into a person's body and cause illness. There are many different types of viruses. And they cause  many types of illness. Viral illnesses can range from mild to severe. They can affect various parts of the body. Short-term conditions that are caused by a virus include colds and flu (influenza) and stomach viruses. Long-term conditions that are caused by a virus include herpes, shingles, and human immunodeficiency virus (HIV) infection. A few viruses have been linked to certain cancers. What are the causes? Many types of viruses can cause illness. Viruses get into cells in your body, multiply, and cause the infected cells to work differently or die. When these cells die, they release more of the virus. When this happens, you get symptoms of the illness and the virus spreads to other cells. If the virus takes over how the cell works, it can cause the cell to divide and grow out of control. This happens when a virus causes cancer. Different viruses get into the body in different ways. You can get a virus by: Swallowing food or water that has come in contact with the virus. Breathing in droplets that have been coughed or sneezed into the air by an infected person. Touching a surface that has the virus on it and then touching your eyes, nose, or mouth. Being bitten  by an insect or animal that carries the virus. Having sexual contact with a person who is infected with the virus. Being exposed to blood or fluids that contain the virus, either through an open cut or during a transfusion. If a virus enters your body, your body's disease-fighting system (immune system) will try to fight the virus. You may be at higher risk for a viral illness if your immune system is weak. What are the signs or symptoms? Symptoms depend on the type of virus and the location of the cells that it gets into. Symptoms can include: For cold and flu viruses: Fever. Headache. Sore throat. Muscle aches. Stuffy nose (nasal congestion). Cough. For stomach (gastrointestinal) viruses: Fever. Pain in the abdomen. Nausea or  vomiting. Diarrhea. For liver viruses (hepatitis): Loss of appetite. Feeling tired. Skin or the white parts of your eyes turning yellow (jaundice). For brain and spinal cord viruses: Fever. Headache. Stiff neck. Nausea and vomiting. Confusion or being sleepy. For skin viruses: Warts. Itching. Rash. For sexually transmitted viruses: Discharge. Swelling. Redness. Rash. How is this diagnosed? This condition may be diagnosed based on one or more of these: Your symptoms and medical history. A physical exam. Tests, such as: Blood tests. Tests on a sample of mucus from your lungs (sputum sample). Tests on a poop (stool) sample. Tests on a swab of body fluids or a skin sore (lesion). How is this treated? Viruses can be hard to treat because they live within cells. Antibiotics do not treat viruses because these medicines do not get inside cells. Treatment for a viral illness may include: Resting and drinking a lot of fluids. Medicines to treat symptoms. These can include over-the-counter medicine for pain and fever, medicines for cough or congestion, and medicines for diarrhea. Antiviral medicines. These medicines are available only for certain types of viruses. Some viral illnesses can be prevented with vaccinations. A common example is the flu shot. Follow these instructions at home: Medicines Take over-the-counter and prescription medicines only as told by your health care provider. If you were prescribed an antiviral medicine, take it as told by your provider. Do not stop taking the antiviral even if you start to feel better. Know when antibiotics are needed and when they are not needed. Antibiotics do not treat viruses. You may get an antibiotic if your provider thinks that you may have, or are at risk for, a bacterial infection and you have a viral infection. Do not ask for an antibiotic prescription if you have been diagnosed with a viral illness. Antibiotics will not make your  illness go away faster. Taking antibiotics when they are not needed can lead to antibiotic resistance. When this develops, the medicine no longer works against the bacteria that it normally fights. General instructions Drink enough fluids to keep your pee (urine) pale yellow. Rest as much as possible. Return to your normal activities as told by your provider. Ask your provider what activities are safe for you. How is this prevented? To lower your risk of getting another viral illness: Wash your hands often with soap and water for at least 20 seconds. If soap and water are not available, use hand sanitizer. Avoid touching your nose, eyes, and mouth, especially if you have not washed your hands recently. If anyone in your household has a viral infection, clean all household surfaces that may have been in contact with the virus. Use soap and hot water. You may also use a commercially prepared, bleach-containing solution. Stay away from people  who are sick with symptoms of a viral infection. Do not share items such as toothbrushes and water bottles with other people. Keep your vaccinations up to date. This includes getting a yearly flu shot. Eat a healthy diet and get plenty of rest. Contact a health care provider if: You have symptoms of a viral illness that do not go away. Your symptoms come back after going away. Your symptoms get worse. Get help right away if: You have trouble breathing. You have a severe headache or a stiff neck. You have severe vomiting or pain in your abdomen. These symptoms may be an emergency. Get help right away. Call 911. Do not wait to see if the symptoms will go away. Do not drive yourself to the hospital. This information is not intended to replace advice given to you by your health care provider. Make sure you discuss any questions you have with your health care provider. Document Revised: 01/26/2022 Document Reviewed: 11/10/2021 Elsevier Patient Education   2024 Elsevier Inc.    Edwina Barth, MD [facility]

## 2023-03-08 NOTE — Assessment & Plan Note (Signed)
Symptom management discussed May take Tylenol for headaches Stay away from NSAIDs Advised to rest and stay well-hydrated Contact the office if no better or worse during the next several days.

## 2023-03-08 NOTE — Patient Instructions (Signed)
Viral Illness, Adult Viruses are tiny germs that can get into a person's body and cause illness. There are many different types of viruses. And they cause many types of illness. Viral illnesses can range from mild to severe. They can affect various parts of the body. Short-term conditions that are caused by a virus include colds and flu (influenza) and stomach viruses. Long-term conditions that are caused by a virus include herpes, shingles, and human immunodeficiency virus (HIV) infection. A few viruses have been linked to certain cancers. What are the causes? Many types of viruses can cause illness. Viruses get into cells in your body, multiply, and cause the infected cells to work differently or die. When these cells die, they release more of the virus. When this happens, you get symptoms of the illness and the virus spreads to other cells. If the virus takes over how the cell works, it can cause the cell to divide and grow out of control. This happens when a virus causes cancer. Different viruses get into the body in different ways. You can get a virus by: Swallowing food or water that has come in contact with the virus. Breathing in droplets that have been coughed or sneezed into the air by an infected person. Touching a surface that has the virus on it and then touching your eyes, nose, or mouth. Being bitten by an insect or animal that carries the virus. Having sexual contact with a person who is infected with the virus. Being exposed to blood or fluids that contain the virus, either through an open cut or during a transfusion. If a virus enters your body, your body's disease-fighting system (immune system) will try to fight the virus. You may be at higher risk for a viral illness if your immune system is weak. What are the signs or symptoms? Symptoms depend on the type of virus and the location of the cells that it gets into. Symptoms can include: For cold and flu  viruses: Fever. Headache. Sore throat. Muscle aches. Stuffy nose (nasal congestion). Cough. For stomach (gastrointestinal) viruses: Fever. Pain in the abdomen. Nausea or vomiting. Diarrhea. For liver viruses (hepatitis): Loss of appetite. Feeling tired. Skin or the white parts of your eyes turning yellow (jaundice). For brain and spinal cord viruses: Fever. Headache. Stiff neck. Nausea and vomiting. Confusion or being sleepy. For skin viruses: Warts. Itching. Rash. For sexually transmitted viruses: Discharge. Swelling. Redness. Rash. How is this diagnosed? This condition may be diagnosed based on one or more of these: Your symptoms and medical history. A physical exam. Tests, such as: Blood tests. Tests on a sample of mucus from your lungs (sputum sample). Tests on a poop (stool) sample. Tests on a swab of body fluids or a skin sore (lesion). How is this treated? Viruses can be hard to treat because they live within cells. Antibiotics do not treat viruses because these medicines do not get inside cells. Treatment for a viral illness may include: Resting and drinking a lot of fluids. Medicines to treat symptoms. These can include over-the-counter medicine for pain and fever, medicines for cough or congestion, and medicines for diarrhea. Antiviral medicines. These medicines are available only for certain types of viruses. Some viral illnesses can be prevented with vaccinations. A common example is the flu shot. Follow these instructions at home: Medicines Take over-the-counter and prescription medicines only as told by your health care provider. If you were prescribed an antiviral medicine, take it as told by your provider. Do not stop  taking the antiviral even if you start to feel better. Know when antibiotics are needed and when they are not needed. Antibiotics do not treat viruses. You may get an antibiotic if your provider thinks that you may have, or are at risk  for, a bacterial infection and you have a viral infection. Do not ask for an antibiotic prescription if you have been diagnosed with a viral illness. Antibiotics will not make your illness go away faster. Taking antibiotics when they are not needed can lead to antibiotic resistance. When this develops, the medicine no longer works against the bacteria that it normally fights. General instructions Drink enough fluids to keep your pee (urine) pale yellow. Rest as much as possible. Return to your normal activities as told by your provider. Ask your provider what activities are safe for you. How is this prevented? To lower your risk of getting another viral illness: Wash your hands often with soap and water for at least 20 seconds. If soap and water are not available, use hand sanitizer. Avoid touching your nose, eyes, and mouth, especially if you have not washed your hands recently. If anyone in your household has a viral infection, clean all household surfaces that may have been in contact with the virus. Use soap and hot water. You may also use a commercially prepared, bleach-containing solution. Stay away from people who are sick with symptoms of a viral infection. Do not share items such as toothbrushes and water bottles with other people. Keep your vaccinations up to date. This includes getting a yearly flu shot. Eat a healthy diet and get plenty of rest. Contact a health care provider if: You have symptoms of a viral illness that do not go away. Your symptoms come back after going away. Your symptoms get worse. Get help right away if: You have trouble breathing. You have a severe headache or a stiff neck. You have severe vomiting or pain in your abdomen. These symptoms may be an emergency. Get help right away. Call 911. Do not wait to see if the symptoms will go away. Do not drive yourself to the hospital. This information is not intended to replace advice given to you by your health  care provider. Make sure you discuss any questions you have with your health care provider. Document Revised: 01/26/2022 Document Reviewed: 11/10/2021 Elsevier Patient Education  2024 [employer].

## 2023-03-09 ENCOUNTER — Encounter: Payer: HMO | Admitting: Physical Therapy

## 2023-03-17 ENCOUNTER — Other Ambulatory Visit: Payer: Self-pay | Admitting: Emergency Medicine

## 2023-03-17 DIAGNOSIS — I10 Essential (primary) hypertension: Secondary | ICD-10-CM

## 2023-03-27 ENCOUNTER — Other Ambulatory Visit: Payer: Self-pay | Admitting: Emergency Medicine

## 2023-03-27 DIAGNOSIS — Z1231 Encounter for screening mammogram for malignant neoplasm of breast: Secondary | ICD-10-CM

## 2023-04-17 ENCOUNTER — Ambulatory Visit (INDEPENDENT_AMBULATORY_CARE_PROVIDER_SITE_OTHER): Payer: Self-pay

## 2023-04-17 VITALS — Ht 61.0 in | Wt 218.0 lb

## 2023-04-17 DIAGNOSIS — Z0000 Encounter for general adult medical examination without abnormal findings: Secondary | ICD-10-CM

## 2023-04-17 NOTE — Progress Notes (Signed)
 Subjective:   Diane Daugherty is a 83 y.o. who presents for a Medicare Wellness preventive visit.  Visit Complete: Virtual I connected with  Diane Daugherty on 04/17/23 by a audio enabled telemedicine application and verified that I am speaking with the correct person using two identifiers.  Patient Location: Home  Provider Location: Office/Clinic  I discussed the limitations of evaluation and management by telemedicine. The patient expressed understanding and agreed to proceed.  Vital Signs: Because this visit was a virtual/telehealth visit, some criteria may be missing or patient reported. Any vitals not documented were not able to be obtained and vitals that have been documented are patient reported.  VideoDeclined- This patient declined Librarian, academic. Therefore the visit was completed with audio only.  Persons Participating in Visit: Patient.  AWV Questionnaire: Yes: Patient Medicare AWV questionnaire was completed by the patient on 04/13/2023; I have confirmed that all information answered by patient is correct and no changes since this date.  Cardiac Risk Factors include: advanced age (>67men, >54 women);hypertension;obesity (BMI >30kg/m2)     Objective:    Today's Vitals   04/17/23 1314  Weight: 218 lb (98.9 kg)  Height: 5\' 1"  (1.549 m)   Body mass index is 41.19 kg/m.     04/17/2023    1:13 PM 01/11/2023    2:07 PM 01/02/2023    3:37 PM 12/29/2022    2:55 PM 04/12/2022    2:19 PM 02/10/2022   11:20 AM 12/01/2021   11:11 AM  Advanced Directives  Does Patient Have a Medical Advance Directive? Yes Yes Yes No Yes No No  Type of Estate agent of [place];Living will Healthcare Power of [place];Living will Healthcare Power of [place];Living will  Healthcare Power of [place];Living will    Does patient want to make changes to medical advance directive? No - Patient declined        Copy of Healthcare Power of Attorney in  Chart? Yes - validated most recent copy scanned in chart (See row information)    No - copy requested    Would patient like information on creating a medical advance directive?      No - Patient declined No - Patient declined    Current Medications (verified) Outpatient Encounter Medications as of 04/17/2023  Medication Sig   acetaminophen (TYLENOL) 500 MG tablet Take 500 mg by mouth every 6 (six) hours as needed.   amLODipine (NORVASC) 5 MG tablet TAKE 1 TABLET (5 MG TOTAL) BY MOUTH DAILY.   baclofen (LIORESAL) 10 MG tablet Take 1 tablet (10 mg total) by mouth 3 (three) times daily as needed for muscle spasms.   Calcium Citrate-Vitamin D (CALCIUM CITRATE + D PO) Take 1 tablet by mouth daily.   CHELATED IRON PO Take 27 mg by mouth daily.   fluticasone (FLONASE) 50 MCG/ACT nasal spray Place 2 sprays into both nostrils daily as needed for allergies or rhinitis.   Krill Oil 300 MG CAPS Take 300 mg by mouth daily.   Multiple Vitamins-Minerals (CENTRUM SILVER 50+WOMEN PO) Take 1 tablet by mouth daily.   nortriptyline (PAMELOR) 10 MG capsule Take 2 capsules (20 mg total) by mouth at bedtime. Take 1 capsule (10 mg) at bedtime for 1 week, then increase to 2 capsules (20 mg) at bedtime thereafter   valsartan-hydrochlorothiazide (DIOVAN-HCT) 320-12.5 MG tablet Take 1 tablet by mouth daily.   [DISCONTINUED] doxycycline (VIBRA-TABS) 100 MG tablet Take 1 tablet (100 mg total) by mouth 2 (two) times  daily.   [DISCONTINUED] HYDROcodone-acetaminophen (NORCO) 5-325 MG tablet Take 1 tablet by mouth every 6 (six) hours as needed for moderate pain (pain score 4-6).   No facility-administered encounter medications on file as of 04/17/2023.    Allergies (verified) Levaquin [levofloxacin], Morphine and codeine, Other, Oxycodone, Tramadol, and Naprosyn [naproxen]   History: Past Medical History:  Diagnosis Date   Anemia 09/24/1968   "after childbirth"   Arthritis    "feels like all over"   Carpal tunnel  syndrome    Carpal tunnel syndrome    left   Cataract    immature on right   Chronic bronchitis (HCC)    "get it most years" (10/08/2014)   Chronic kidney disease    stage III, per Pt   Family history of anesthesia complication    daughter gets sick   History of blood transfusion    "w/one of my back OR's"   Hypertension    takes Hyzaar daily   Joint pain    "all over"   Joint swelling    Muscle spasms of lower extremity    takes Baclofen daily prn   Nocturia    OSA on CPAP    Peripheral vascular disease (HCC)    PFO (patent foramen ovale)    positive saline bubble study 05/12/09 ([facility])   Pneumonia 2010/ 2011   PONV (postoperative nausea and vomiting)    woke up during hysterectomy   Shortness of breath    with exertion;Albuterol prn   Urinary frequency    Past Surgical History:  Procedure Laterality Date   ABDOMINAL HYSTERECTOMY  1970's?   APPENDECTOMY     BACK SURGERY     BREAST BIOPSY Right 1990's   CESAREAN SECTION  1962; 1963; 1970   CHEST TUBE INSERTION  80's   d/t pneumothorax   COLONOSCOPY     FRACTURE SURGERY     HARDWARE REMOVAL Right 1990's   "took screw out of my lower leg ~ 1 yr after putting it in"   HEMILAMINOTOMY LUMBAR SPINE  02/2002   L5; decompression of the L5 and S1 nerve root; synovial cyst excision/notes 06/09/2010   KNEE ARTHROSCOPY Right ?2013   LUMBAR LAMINECTOMY/DECOMPRESSION MICRODISCECTOMY  02/2010   POSTERIOR LUMBAR FUSION  05/2003   L4-5 and S-1    TIBIA FRACTURE SURGERY Right 1990's   "put screw in"   TOTAL KNEE ARTHROPLASTY Right 08/28/2012   Procedure: TOTAL KNEE ARTHROPLASTY;  Surgeon: Valeria Batman, MD;  Location: [facility] OR;  Service: Orthopedics;  Laterality: Right;   TOTAL KNEE ARTHROPLASTY Left 10/07/2014   TOTAL KNEE ARTHROPLASTY Left 10/07/2014   Procedure: TOTAL KNEE ARTHROPLASTY;  Surgeon: Valeria Batman, MD;  Location[facility] OR;  Service: Orthopedics;  Laterality: Left;   TOTAL KNEE REVISION Left 10/08/2021   Procedure:  LEFT TOTAL KNEE REVISION;  Surgeon: Kathryne Hitch, MD;  Location: [facility] ORS;  Service: Orthopedics;  Laterality: Left;   History reviewed. No pertinent family history. Social History   Socioeconomic History   Marital status: Divorced    Spouse name: Not on file   Number of children: Not on file   Years of education: Not on file   Highest education level: 12th grade  Occupational History   Not on file  Tobacco Use   Smoking status: Never    Passive exposure: Never   Smokeless tobacco: Never  Vaping Use   Vaping status: Never Used  Substance and Sexual Activity   Alcohol use: No   Drug  use: No   Sexual activity: Not Currently  Other Topics Concern   Not on file  Social History Narrative   Are you right handed or left handed? Right   Are you currently employed ?    What is your current occupation? Retired/disability   Do you live at home alone?   Who lives with you? daughter   What type of home do you live in: 1 story or 2 story? two   Caffeine I in am    Social Drivers of Health   Financial Resource Strain: Low Risk  (04/17/2023)   Overall Financial Resource Strain (CARDIA)    Difficulty of Paying Living Expenses: Not hard at all  Food Insecurity: No Food Insecurity (04/17/2023)   Hunger Vital Sign    Worried About Running Out of Food in the Last Year: Never true    Ran Out of Food in the Last Year: Never true  Transportation Needs: No Transportation Needs (04/17/2023)   PRAPARE - Administrator, Civil Service (Medical): No    Lack of Transportation (Non-Medical): No  Physical Activity: Insufficiently Active (04/17/2023)   Exercise Vital Sign    Days of Exercise per Week: 3 days    Minutes of Exercise per Session: 20 min  Stress: No Stress Concern Present (04/17/2023)   [employer] of Occupational Health - Occupational Stress Questionnaire    Feeling of Stress : Not at all  Social Connections: Moderately Integrated (04/17/2023)   Social  Connection and Isolation Panel [NHANES]    Frequency of Communication with Friends and Family: More than three times a week    Frequency of Social Gatherings with Friends and Family: More than three times a week    Attends Religious Services: More than 4 times per year    Active Member of [employer] or Organizations: Yes    Attends Engineer, structural: More than 4 times per year    Marital Status: Divorced    Tobacco Counseling Counseling given: No    Clinical Intake:  Pre-visit preparation completed: Yes  Pain : No/denies pain     BMI - recorded: 41.19 Nutritional Status: BMI > 30  Obese Nutritional Risks: None Diabetes: No  No results found for: "HGBA1C"   How often do you need to have someone help you when you read instructions, pamphlets, or other written materials from your doctor or pharmacy?: 1 - Never  Interpreter Needed?: No  Information entered by :: Hassell Halim, CMA   Activities of Daily Living     04/17/2023    1:18 PM 04/13/2023   11:17 AM  In your present state of health, do you have any difficulty performing the following activities:  Hearing? 0 0  Vision? 1 1  Comment eye exam appt in 06/2023   Difficulty concentrating or making decisions? 0 0  Walking or climbing stairs? 1 1  Comment uses a cane   Dressing or bathing? 0 0  Doing errands, shopping? 0 0  Preparing Food and eating ? N N  Using the Toilet? N N  In the past six months, have you accidently leaked urine? N N  Do you have problems with loss of bowel control? N N  Managing your Medications? N N  Managing your Finances? N N  Housekeeping or managing your Housekeeping? N N    Patient Care Team: [name], MD as PCP - General (Internal Medicine) Jaci Standard, MD as Consulting Physician (Hematology and Oncology) Jacquelyne Balint  Elbert Ewings, MD as Consulting Physician (Neurology) Bufford Buttner, MD as Consulting Physician (Nephrology) Daria Pastures, MD as Consulting  Physician (Vascular Surgery)  Indicate any recent Medical Services you may have received from other than [facility] providers in the past year (date may be approximate).     Assessment:   This is a routine wellness examination for Diane Daugherty.  Hearing/Vision screen Hearing Screening - Comments:: Denies hearing difficulties   Vision Screening - Comments:: Wears rx glasses - up to date with routine eye exams with [facility]   Goals Addressed               This Visit's Progress     Patient Stated (pt-stated)        Patient stated she plans to stay active and take care of herself.       Depression Screen     04/17/2023    1:20 PM 03/08/2023    1:25 PM 02/28/2023    1:13 PM 01/11/2023   10:53 AM 12/20/2022    1:45 PM 10/26/2022    2:22 PM 08/29/2022   11:04 AM  PHQ 2/9 Scores  PHQ - 2 Score 0 0 0 0 0 0 0  PHQ- 9 Score 0          Fall Risk     04/17/2023    1:19 PM 04/13/2023   11:17 AM 03/08/2023    1:25 PM 02/28/2023    1:20 PM 01/11/2023   10:52 AM  Fall Risk   Falls in the past year? 0 0 0 0 0  Number falls in past yr: 0 0 0 0 0  Injury with Fall? 0  0 0 0  Risk for fall due to : No Fall Risks  No Fall Risks No Fall Risks No Fall Risks  Risk for fall due to: Comment     cane  Follow up Falls evaluation completed;Falls prevention discussed  Falls evaluation completed Falls evaluation completed Falls evaluation completed    MEDICARE RISK AT HOME:  Medicare Risk at Home Any stairs in or around the home?: Yes If so, are there any without handrails?: No Home free of loose throw rugs in walkways, pet beds, electrical cords, etc?: Yes Adequate lighting in your home to reduce risk of falls?: Yes Life alert?: No Use of a cane, walker or w/c?: Yes (cane, walker) Grab bars in the bathroom?: Yes Shower chair or bench in shower?: No Elevated toilet seat or a handicapped toilet?: Yes  TIMED UP AND GO:  Was the test performed?  No  Cognitive Function: 6CIT completed         04/17/2023    1:21 PM 04/12/2022    2:22 PM  6CIT Screen  What Year? 0 points 0 points  What month? 0 points 0 points  What time? 0 points 0 points  Count back from 20 0 points 0 points  Months in reverse 0 points 0 points  Repeat phrase 0 points 0 points  Total Score 0 points 0 points    Immunizations Immunization History  Administered Date(s) Administered   Fluad Quad(high Dose 65+) 11/07/2021, 09/24/2022   Influenza,inj,Quad PF,6+ Mos 11/22/2016   Moderna SARS-COV2 Booster Vaccination 08/30/2022   Moderna Sars-Covid-2 Vaccination 02/11/2019, 03/11/2019   PNEUMOCOCCAL CONJUGATE-20 02/28/2022   Pneumococcal Polysaccharide-23 01/14/2003, 10/09/2014, 11/22/2016   Tdap 02/18/2020, 08/24/2021    Screening Tests Health Maintenance  Topic Date Due   Zoster Vaccines- Shingrix (1 of 2) Never done   COVID-19 Vaccine (3 -  Moderna risk series) 09/27/2022   DEXA SCAN  02/20/2024   Medicare Annual Wellness (AWV)  04/16/2024   DTaP/Tdap/Td (3 - Td or Tdap) 08/25/2031   Pneumonia Vaccine 61+ Years old  Completed   INFLUENZA VACCINE  Completed   HPV VACCINES  Aged Out   Colonoscopy  Discontinued    Health Maintenance  Health Maintenance Due  Topic Date Due   Zoster Vaccines- Shingrix (1 of 2) Never done   COVID-19 Vaccine (3 - Moderna risk series) 09/27/2022   Health Maintenance Items Addressed:04/17/2023  DEXA Scan: Completed 02/20/2023 by Dr Leonides Schanz (Oncologist). Due in 25yr.  Additional Screening:  Vision Screening: Recommended annual ophthalmology exams for early detection of glaucoma and other disorders of the eye.  Dental Screening: Recommended annual dental exams for proper oral hygiene  Community Resource Referral / Chronic Care Management: CRR required this visit?  No   CCM required this visit?  No     Plan:     I have personally reviewed and noted the following in the patient's chart:   Medical and social history Use of alcohol, tobacco or illicit drugs   Current medications and supplements including opioid prescriptions. Patient is not currently taking opioid prescriptions. Functional ability and status Nutritional status Physical activity Advanced directives List of other physicians Hospitalizations, surgeries, and ER visits in previous 12 months Vitals Screenings to include cognitive, depression, and falls Referrals and appointments  In addition, I have reviewed and discussed with patient certain preventive protocols, quality metrics, and best practice recommendations. A written personalized care plan for preventive services as well as general preventive health recommendations were provided to patient.     Darreld Mclean, CMA   04/17/2023   After Visit Summary: (MyChart) Due to this being a telephonic visit, the after visit summary with patients personalized plan was offered to patient via MyChart   Notes: Nothing significant to report at this time.

## 2023-04-17 NOTE — Patient Instructions (Addendum)
 Ms. Ramsaran , Thank you for taking time to come for your Medicare Wellness Visit. I appreciate your ongoing commitment to your health goals. Please review the following plan we discussed and let me know if I can assist you in the future.   Referrals/Orders/Follow-Ups/Clinician Recommendations: Aim for 30 minutes of exercise or brisk walking, 6-8 glasses of water, and 5 servings of fruits and vegetables each day.   This is a list of the screening recommended for you and due dates:  Health Maintenance  Topic Date Due   Zoster (Shingles) Vaccine (1 of 2) Never done   COVID-19 Vaccine (3 - Moderna risk series) 09/27/2022   DEXA scan (bone density measurement)  02/20/2024   Medicare Annual Wellness Visit  04/16/2024   DTaP/Tdap/Td vaccine (3 - Td or Tdap) 08/25/2031   Pneumonia Vaccine  Completed   Flu Shot  Completed   HPV Vaccine  Aged Out   Colon Cancer Screening  Discontinued    Advanced directives: (In Chart) A copy of your advanced directives are scanned into your chart should your provider ever need it.  Next Medicare Annual Wellness Visit scheduled for next year: Yes

## 2023-04-30 DIAGNOSIS — I10 Essential (primary) hypertension: Secondary | ICD-10-CM | POA: Diagnosis not present

## 2023-04-30 DIAGNOSIS — G4733 Obstructive sleep apnea (adult) (pediatric): Secondary | ICD-10-CM | POA: Diagnosis not present

## 2023-05-01 ENCOUNTER — Ambulatory Visit
Admission: RE | Admit: 2023-05-01 | Discharge: 2023-05-01 | Disposition: A | Discharge status: Home / Self Care | Source: Ambulatory Visit | Attending: Emergency Medicine

## 2023-05-01 DIAGNOSIS — Z1231 Encounter for screening mammogram for malignant neoplasm of breast: Secondary | ICD-10-CM

## 2023-05-03 NOTE — Progress Notes (Signed)
 NEUROLOGY FOLLOW UP OFFICE NOTE  Diane Daugherty [id number]  Subjective:  Diane Daugherty is a 83 y.o. year old right-handed female with a medical history of smoldering multiple myeloma, HTN, OSA (on CPAP), CKD, carpal tunnel syndrome who we last saw on 12/29/22 for headaches.  To briefly review: 12/29/22: Patient's symptoms started with neck stiffness in 08/2022. The headaches started in 09/2022. The pain is mostly at the base of her skull to temples and forehead. It throbs in the back of her head. It can be 8-9/10. She finds her head more sensitive. Her headache can last days. She has ~4 days of headache per week. She endorses photophobia and some nausea. She denies phonophobia.   She has seen her PCP who treated her for a sinus infection. Things did not improve with this. Vasculitis was also considered, but imaging and vasc surgery did not support this. She had a 7 day course of steroids that helped. Tramadol and Norco did not help. She also tried baclofen    She denies vision changes. She denies jaw claudication. She denies fevers, night sweats, or unintentional weight loss.   She was not previously a headache person (prior to neck pain in 08/2022).   Followed by hematology/oncology for smoldering multiple myeloma. Per most recent clinic note on 08/17/22:  #Smoldering Multiple Myeloma -- The patient currently meets one of the three 20-2-20 criteria (bmbx with 20% plasma cells). Two criteria are required for treatment.  --labs from 08/11/2022 reviewed with patient. WBC 6.9,  Hgb 11.2, MCV 93.1, and platelets of 179. Myeloma labs show stable M protein measuring 1.6 g/dL. Kappa light chain elevated at 86.5, lambda light chain 9.0, ratio 9.61. -- No clear indication for treatment as long as the patient does not meet this criteria or any of the CRAB criteria --Continue on observation. We will order SPEP, UPEP, serum free light chains, LDH, CMP and CBC with each visit.  --Most recent bone scan from  02/18/2022 showed no definitive lytic bone lesion. Next scan due at next visit.  --Recommend follow-up visit in 6 months time    Caffeine use: 1 cup of coffee per day EtOH use: none Non-smoker Family history of neurologic problems or headaches: Daughter with migraines  Most recent Assessment and Plan (12/29/22): Diane Daugherty is a 83 y.o. female who presents for evaluation of headache and neck pain. She has a relevant medical history of smoldering multiple myeloma, HTN, OSA (on CPAP), CKD, carpal tunnel syndrome. Her neurological examination is essentially normal. 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 There are some migrainous features and she has a family history of migraines, however, until her neck pain, she did not really have headaches, so this is less clear. GCA is another consideration given her elevated ESR, but she has no other symptoms of GCA. I will monitor this closely though.   PLAN: -Blood work: B12, TSH, ESR -Start Nortriptyline 10 mg at bedtime for 1 week, then increase to 20 mg thereafter -Medrol dose pack -PT for neck pain -Okay to try baclofen if she wants for neck tightness  Since their last visit: Nortriptyline was making patient dizzy, unsteady, and confused. She decided to stop the nortriptyline and continue baclofen. She has not taken baclofen lately.  Patient's neck feels better. She completed physical therapy. She feels this helped. She is doing the home exercises.  She continues to have headaches. She typically will feel pain on right back of head into face, more at night. It  comes and go, but is present most nights. It does not prevent her from sleeping. If she gets a comfortable position on the pillow, it goes away. It rarely occurs during the day.  MEDICATIONS:  Outpatient Encounter Medications as of 05/11/2023  Medication Sig   acetaminophen (TYLENOL) 500 MG tablet Take 500 mg by mouth every 6 (six) hours as needed.   amLODipine  (NORVASC) 5 MG tablet TAKE 1 TABLET (5 MG TOTAL) BY MOUTH DAILY.   baclofen (LIORESAL) 10 MG tablet Take 1 tablet (10 mg total) by mouth 3 (three) times daily as needed for muscle spasms.   Calcium Citrate-Vitamin D (CALCIUM CITRATE + D PO) Take 1 tablet by mouth daily.   CHELATED IRON PO Take 27 mg by mouth daily.   fluticasone (FLONASE) 50 MCG/ACT nasal spray Place 2 sprays into both nostrils daily as needed for allergies or rhinitis.   Krill Oil 300 MG CAPS Take 300 mg by mouth daily.   Multiple Vitamins-Minerals (CENTRUM SILVER 50+WOMEN PO) Take 1 tablet by mouth daily.   valsartan-hydrochlorothiazide (DIOVAN-HCT) 320-12.5 MG tablet Take 1 tablet by mouth daily.   nortriptyline (PAMELOR) 10 MG capsule Take 2 capsules (20 mg total) by mouth at bedtime. Take 1 capsule (10 mg) at bedtime for 1 week, then increase to 2 capsules (20 mg) at bedtime thereafter (Patient not taking: Reported on 05/11/2023)   No facility-administered encounter medications on file as of 05/11/2023.    PAST MEDICAL HISTORY: Past Medical History:  Diagnosis Date   Anemia 09/24/1968   "after childbirth"   Arthritis    "feels like all over"   Carpal tunnel syndrome    Carpal tunnel syndrome    left   Cataract    immature on right   Chronic bronchitis (HCC)    "get it most years" (10/08/2014)   Chronic kidney disease    stage III, per Pt   Family history of anesthesia complication    daughter gets sick   History of blood transfusion    "w/one of my back OR's"   Hypertension    takes Hyzaar daily   Joint pain    "all over"   Joint swelling    Muscle spasms of lower extremity    takes Baclofen daily prn   Nocturia    OSA on CPAP    Peripheral vascular disease (HCC)    PFO (patent foramen ovale)    positive saline bubble study 05/12/09 ([facility])   Pneumonia 2010/ 2011   PONV (postoperative nausea and vomiting)    woke up during hysterectomy   Shortness of breath    with exertion;Albuterol prn    Urinary frequency     PAST SURGICAL HISTORY: Past Surgical History:  Procedure Laterality Date   ABDOMINAL HYSTERECTOMY  1970's?   APPENDECTOMY     BACK SURGERY     BREAST BIOPSY Right 1990's   CESAREAN SECTION  1962; 1963; 1970   CHEST TUBE INSERTION  80's   d/t pneumothorax   COLONOSCOPY     FRACTURE SURGERY     HARDWARE REMOVAL Right 1990's   "took screw out of my lower leg ~ 1 yr after putting it in"   HEMILAMINOTOMY LUMBAR SPINE  02/2002   L5; decompression of the L5 and S1 nerve root; synovial cyst excision/notes 06/09/2010   KNEE ARTHROSCOPY Right ?2013   LUMBAR LAMINECTOMY/DECOMPRESSION MICRODISCECTOMY  02/2010   POSTERIOR LUMBAR FUSION  05/2003   L4-5 and S-1    TIBIA FRACTURE SURGERY Right 1990's   "  put screw in"   TOTAL KNEE ARTHROPLASTY Right 08/28/2012   Procedure: TOTAL KNEE ARTHROPLASTY;  Surgeon: Shirlee Dotter, MD;  Location: [facility] OR;  Service: Orthopedics;  Laterality: Right;   TOTAL KNEE ARTHROPLASTY Left 10/07/2014   TOTAL KNEE ARTHROPLASTY Left 10/07/2014   Procedure: TOTAL KNEE ARTHROPLASTY;  Surgeon: Shirlee Dotter, MD;  Location: [facility] OR;  Service: Orthopedics;  Laterality: Left;   TOTAL KNEE REVISION Left 10/08/2021   Procedure: LEFT TOTAL KNEE REVISION;  Surgeon: Arnie Lao, MD;  Location: [facility] ORS;  Service: Orthopedics;  Laterality: Left;    ALLERGIES: Allergies  Allergen Reactions   Levaquin [Levofloxacin] Swelling   Morphine And Codeine Nausea Only   Other Nausea And Vomiting    Darvocet-vomiting   Oxycodone Nausea Only   Tramadol Other (See Comments)    And dizziness   Naprosyn [Naproxen] Itching and Rash    FAMILY HISTORY: History reviewed. No pertinent family history.  SOCIAL HISTORY: Social History   Tobacco Use   Smoking status: Never    Passive exposure: Never   Smokeless tobacco: Never  Vaping Use   Vaping status: Never Used  Substance Use Topics   Alcohol use: No   Drug use: No   Social History   Social History  Narrative   Are you right handed or left handed? Right   Are you currently employed ?    What is your current occupation? Retired/disability   Do you live at home alone?   Who lives with you? daughter   What type of home do you live in: 1 story or 2 story? two   Caffeine I in am       Objective:  Vital Signs:  BP (!) 157/68   Pulse 63   Ht 5\' 1"  (1.549 m)   Wt 222 lb (100.7 kg)   SpO2 96%   BMI 41.95 kg/m   General: No acute distress.  Patient appears well-groomed.   Head:  Normocephalic/atraumatic Eyes:  Fundi examined but not visualized Neck: supple, improved range of motion. No paraspinal tenderness Heart:  Regular rate and rhythm Lungs:  Clear to auscultation bilaterally Neurological Exam: alert and oriented.  Speech fluent and not dysarthric, language intact.  CN II-XII intact. Bulk and tone normal, muscle strength 5/5 throughout.  Sensation to light touch intact.  Deep tendon reflexes 2+ throughout.  Finger to nose testing intact.  Walks with cane. Antalgic gait.   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 Bovine branching pattern noted. No stenosis or other abnormality about the origin the great vessels.   RIGHT CAROTID SYSTEM: Right common and internal carotid arteries are mildly tortuous but patent with antegrade flow. No evidence for dissection. No hemodynamically significant stenosis about the right carotid artery system.   LEFT CAROTID SYSTEM: Left common and internal carotid arteries are mildly tortuous but patent with antegrade flow. No evidence for dissection.  No hemodynamically significant stenosis about the left carotid artery system.   VERTEBRAL ARTERIES: Both vertebral arteries arise from subclavian arteries. No visible proximal subclavian artery stenosis. Right vertebral artery dominant. Left vertebral artery patent without dissection or  stenosis. On the right, there is a 5-6 mm outpouching extending medially from the distal right V2 segment (series 16, image 8), suspicious for a small pseudoaneurysm. No significant stenosis at this level. Dominant right vertebral artery otherwise patent without abnormality.   OTHER: None.   IMPRESSION: 1. 5-6 mm outpouching extending medially from the distal right V2 segment, suspicious for a small pseudoaneurysm. 2. Otherwise negative MRA of the neck. No other acute vascular abnormality. No hemodynamically significant or correctable stenosis. 3. Mild diffuse tortuosity the major arterial vasculature of the neck, suggesting chronic underlying hypertension.   MRI lumbar spine wo contrast (05/07/21): IMPRESSION: 1. Transitional lumbosacral anatomy. For the purposes of this dictation there is partial lumbarization of S1. Correlation with radiographs is recommended prior to any operative intervention. 2. At L2-L3, moderate canal stenosis with severe right greater than left foraminal stenosis. 3. At L3-L4, mild canal stenosis with moderate bilateral foraminal stenosis. 4. L4-S1 posterior fusion with patent canal and foramina at these levels.   EKG (09/29/21): Normal, including PR interval  Assessment/Plan:  This is Diane Daugherty, a 83 y.o. female with intermittent pain on right of head, neck and occipital area that radiates to right face. It is most present when sleeping in certain positions. She went to PT for neck which helped her neck pain but she still has the head pain. She had side effects and could not tolerate nortriptyline.   Patient and her daughter are concerned about intracranial pathology  such as tumor or aneurysm given family history of aneurysm. Given that her headache continued despite PT for neck, brain imaging is reasonable. I offered CT head. CTA head and neck would be needed to evaluate for aneurysm though and this is complicated by the need for IV contrast and that patient has CKD. I am not sure the benefits outweigh the risks. Patient will talk to kidney doctor about this. Overall, she wants to think about imaging and will let me know if she wants it. She would rather retry baclofen for her neck that she stopped taking to see if this helps.  Plan: -Patient will retry baclofen and see if this helps head and neck pain -if she wants head imaging, she will let me know  Return to clinic as needed  Total time spent reviewing records, interview, history/exam, documentation, and coordination of care on day of encounter:  35 min  Rommie Coats, MD

## 2023-05-10 DIAGNOSIS — N1831 Chronic kidney disease, stage 3a: Secondary | ICD-10-CM | POA: Diagnosis not present

## 2023-05-10 DIAGNOSIS — D631 Anemia in chronic kidney disease: Secondary | ICD-10-CM | POA: Diagnosis not present

## 2023-05-10 DIAGNOSIS — D472 Monoclonal gammopathy: Secondary | ICD-10-CM | POA: Diagnosis not present

## 2023-05-10 DIAGNOSIS — N2581 Secondary hyperparathyroidism of renal origin: Secondary | ICD-10-CM | POA: Diagnosis not present

## 2023-05-10 DIAGNOSIS — R635 Abnormal weight gain: Secondary | ICD-10-CM | POA: Diagnosis not present

## 2023-05-10 DIAGNOSIS — I129 Hypertensive chronic kidney disease with stage 1 through stage 4 chronic kidney disease, or unspecified chronic kidney disease: Secondary | ICD-10-CM | POA: Diagnosis not present

## 2023-05-11 ENCOUNTER — Encounter: Payer: Self-pay | Admitting: Neurology

## 2023-05-11 ENCOUNTER — Ambulatory Visit: Payer: HMO | Admitting: Neurology

## 2023-05-11 VITALS — BP 157/68 | HR 63 | Ht 61.0 in | Wt 222.0 lb

## 2023-05-11 DIAGNOSIS — G4486 Cervicogenic headache: Secondary | ICD-10-CM

## 2023-05-11 DIAGNOSIS — M542 Cervicalgia: Secondary | ICD-10-CM

## 2023-05-11 DIAGNOSIS — Z8249 Family history of ischemic heart disease and other diseases of the circulatory system: Secondary | ICD-10-CM | POA: Diagnosis not present

## 2023-05-11 NOTE — Patient Instructions (Signed)
 We discussed your headaches today. I offered to order a CT of your head, though I am not sure this would get to the bottom of why you are having head pain at night in certain positions.  You also mentioned aneurysm in the family. This type of scan would require IV contrast and could hurt your kidneys. If you wanted this test, I would need your kidney doctor to say it was okay.  You decided you wanted to retry baclofen and let me know if you wanted the scans.  Please let me know if you have any questions or concerns in the meantime.  The physicians and staff at [facility] are committed to providing excellent care. You may receive a survey requesting feedback about your experience at our office. We strive to receive "very good" responses to the survey questions. If you feel that your experience would prevent you from giving the office a "very good " response, please contact our office to try to remedy the situation. We may be reached at [phone]. Thank you for taking the time out of your busy day to complete the survey.  Rommie Coats, MD [facility]

## 2023-07-08 IMAGING — US US RENAL
1 series · 14 of 25 positions shown · non-contrast
Comparison: None.

CLINICAL DATA: 79-year-old female with chronic kidney disease.

EXAM:
RENAL / URINARY TRACT ULTRASOUND COMPLETE

[Series 1: us renal · 0.23mm/px · 14 of 32 slices shown]
[im 1/32]
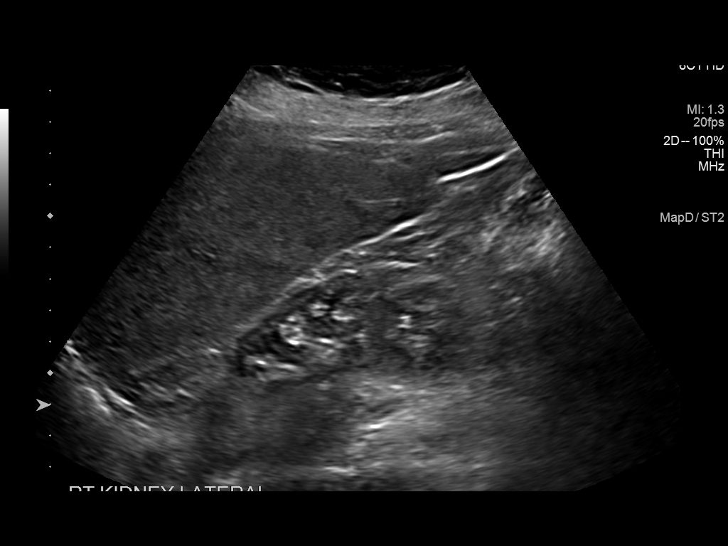
[im 3/32]
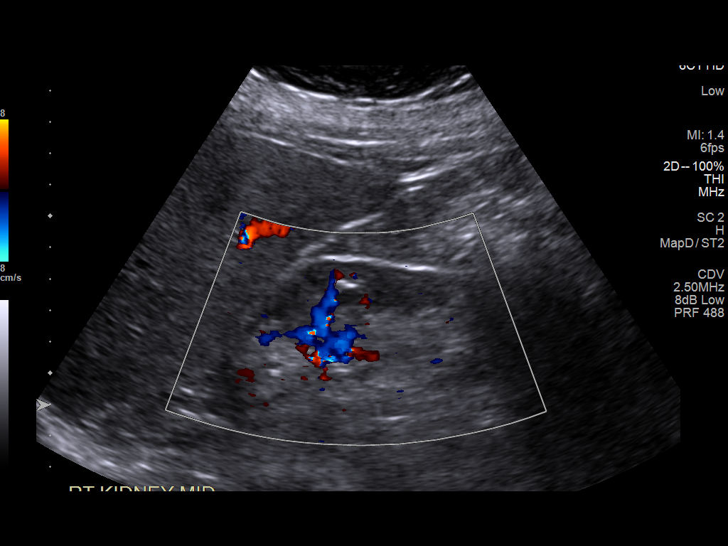
[im 6/32]
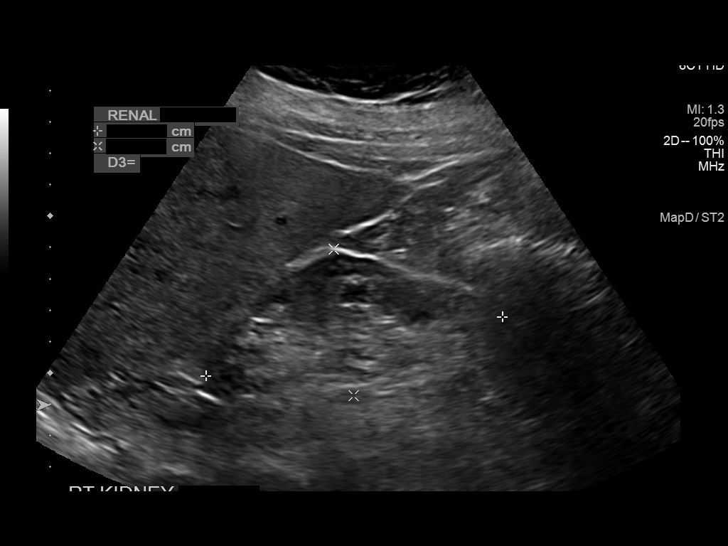
[im 8/32]
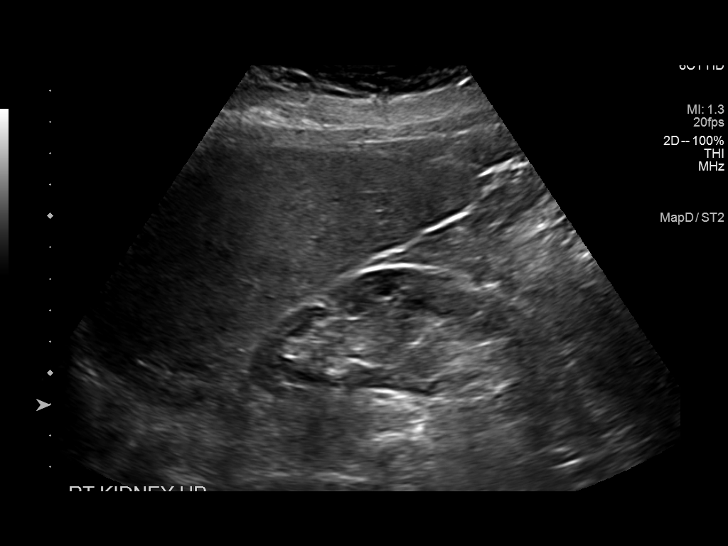
[im 11/32]
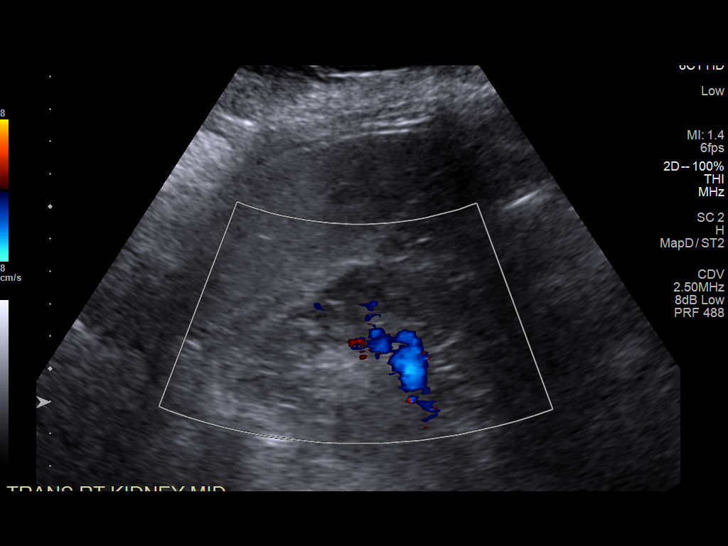
[im 12/32]
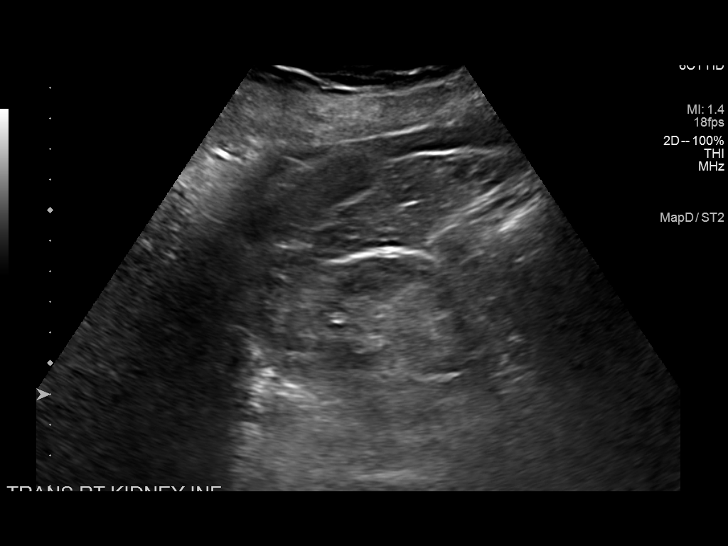
[im 15/32]
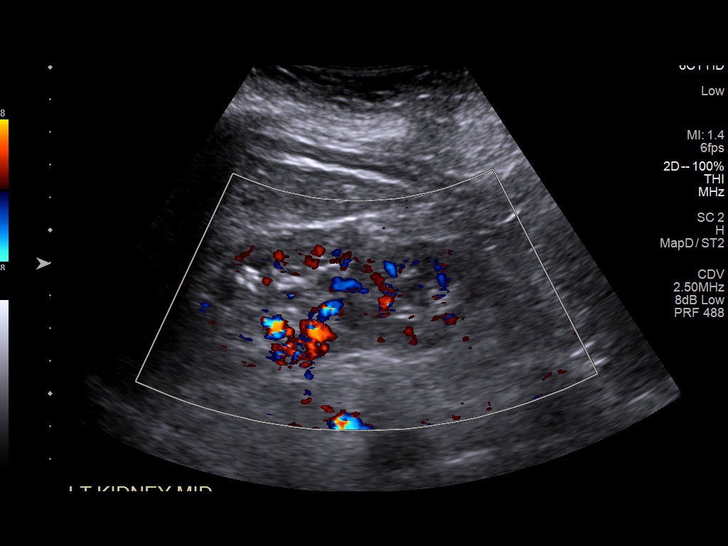
[im 17/32]
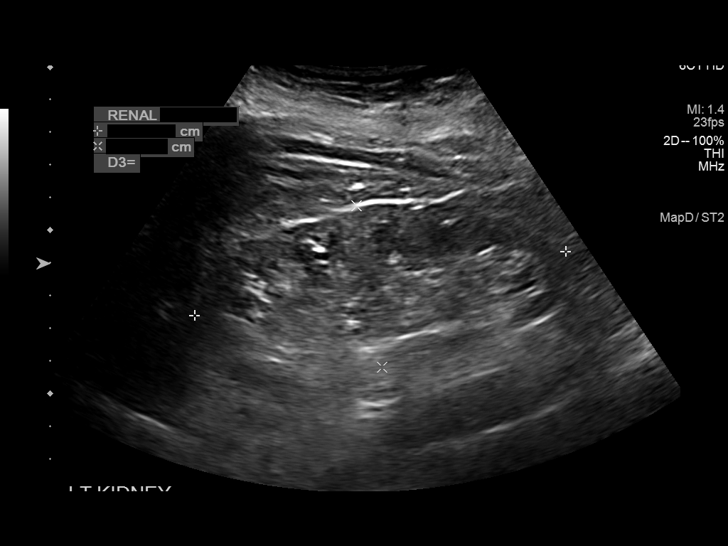
[im 20/32]
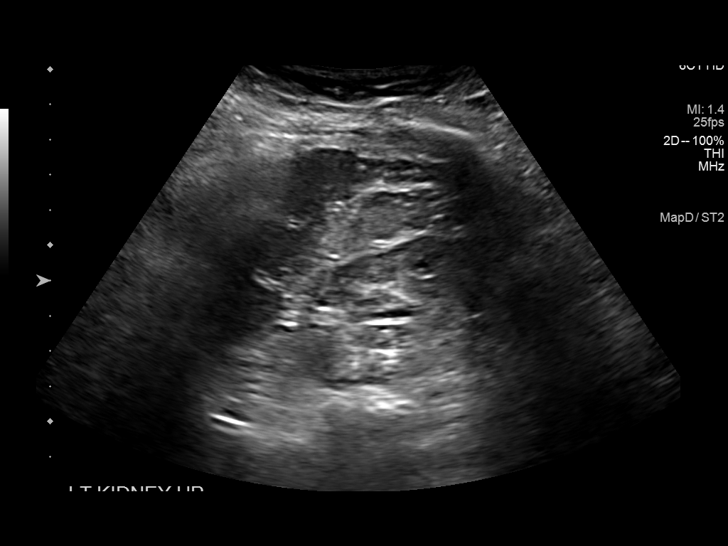
[im 21/32]
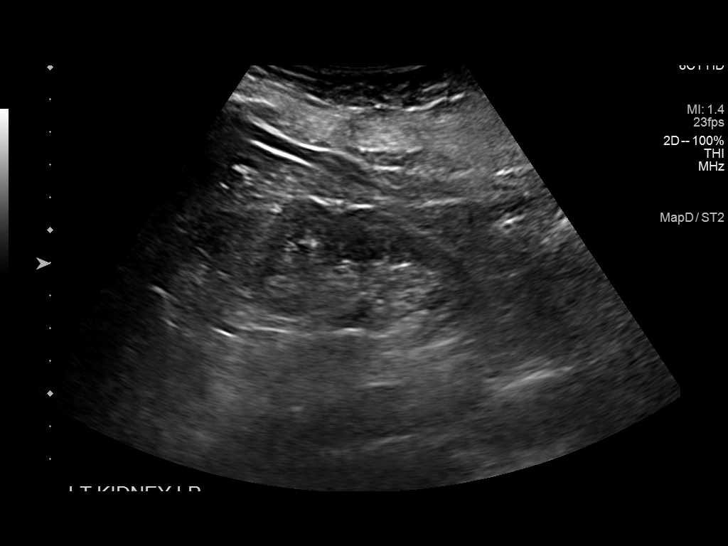
[im 24/32]
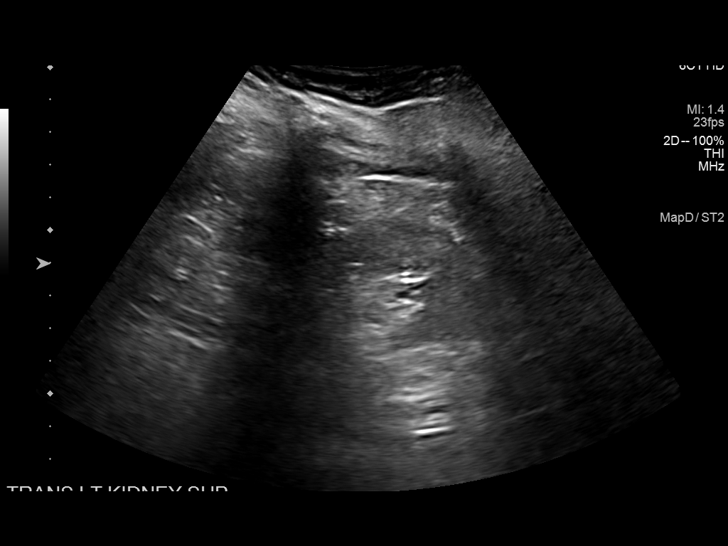
[im 26/32]
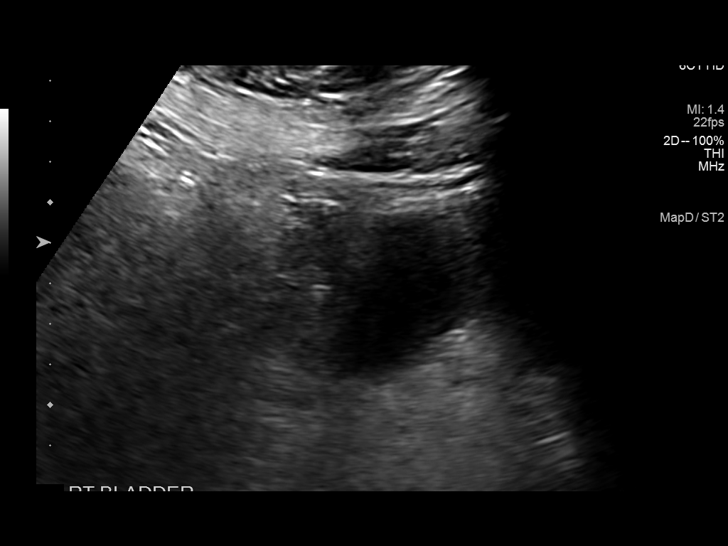
[im 29/32]
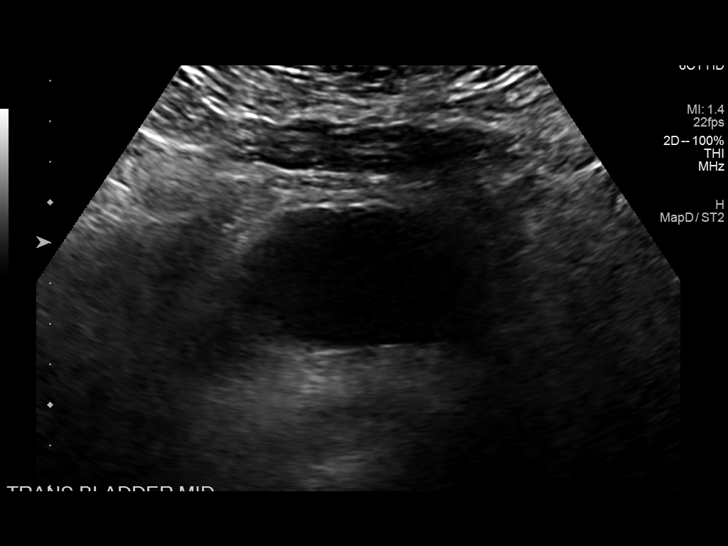
[im 32/32]
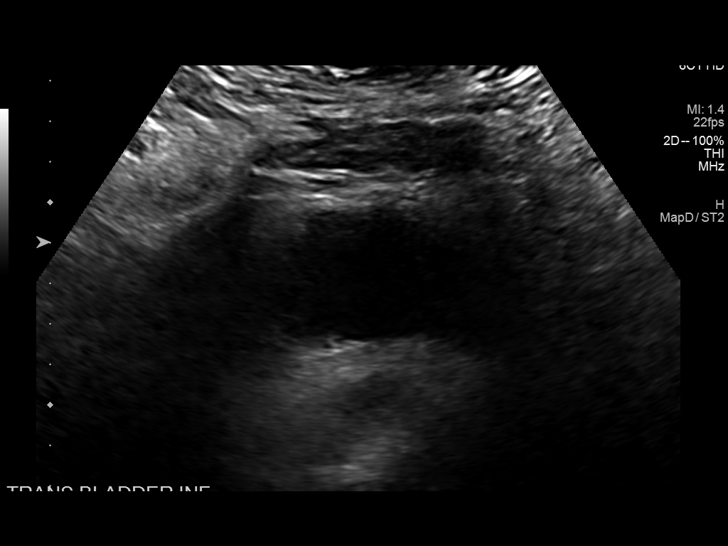

[14 of 25 positions shown; findings below may reference images not displayed]

FINDINGS: Right Kidney:

Renal measurements: 9.6 x 4.7 x 4.8 cm = volume: 114 mL. There is
diffuse increased renal parenchymal echogenicity. No hydronephrosis
or shadowing stone.

Left Kidney:

Renal measurements: 11.5 x 5.0 x 4.6 cm = volume: 138 mL. There is
diffuse increased renal parenchymal echogenicity. No hydronephrosis
or shadowing stone.

Bladder:

Appears normal for degree of bladder distention.

Other:

There is diffuse increased liver echogenicity most commonly seen in
the setting of fatty infiltration. Superimposed inflammation or
fibrosis is not excluded. Clinical correlation is recommended.
IMPRESSION: Mildly echogenic kidneys in keeping with chronic kidney disease. No
hydronephrosis or shadowing stone.

## 2023-07-12 ENCOUNTER — Encounter: Payer: Self-pay | Admitting: Emergency Medicine

## 2023-07-12 ENCOUNTER — Ambulatory Visit (INDEPENDENT_AMBULATORY_CARE_PROVIDER_SITE_OTHER): Payer: PRIVATE HEALTH INSURANCE | Admitting: Emergency Medicine

## 2023-07-12 VITALS — BP 138/78 | HR 84 | Temp 98.4°F | Ht 61.0 in | Wt 221.0 lb

## 2023-07-12 DIAGNOSIS — D472 Monoclonal gammopathy: Secondary | ICD-10-CM | POA: Diagnosis not present

## 2023-07-12 DIAGNOSIS — N1831 Chronic kidney disease, stage 3a: Secondary | ICD-10-CM | POA: Diagnosis not present

## 2023-07-12 DIAGNOSIS — I10 Essential (primary) hypertension: Secondary | ICD-10-CM | POA: Diagnosis not present

## 2023-07-12 DIAGNOSIS — G4733 Obstructive sleep apnea (adult) (pediatric): Secondary | ICD-10-CM | POA: Diagnosis not present

## 2023-07-12 DIAGNOSIS — Z8579 Personal history of other malignant neoplasms of lymphoid, hematopoietic and related tissues: Secondary | ICD-10-CM | POA: Diagnosis not present

## 2023-07-12 NOTE — Assessment & Plan Note (Signed)
 Stable on CPAP treatment.

## 2023-07-12 NOTE — Assessment & Plan Note (Signed)
 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  Stable chronic condition Sees oncologist on a regular basis

## 2023-07-12 NOTE — Assessment & Plan Note (Signed)
 Chronic stable condition. Advised to stay well-hydrated and avoid NSAIDs

## 2023-07-12 NOTE — Progress Notes (Signed)
 Diane Daugherty 83 y.o.   Chief Complaint  Patient presents with   Follow-up    Patient here for 6 month f/u for HTN. Patient states both legs and lower back left side has been hurting for about a month. Patient is taking tylenol  or the pain but states it is not helping     HISTORY OF PRESENT ILLNESS: This is a 83 y.o. female here for 49-month follow-up of chronic medical conditions Has occasional low back pain radiating to both legs.  Has history of chronic pain Also has history of chronic kidney disease. Has taken tramadol  before but does not like it. Advised to avoid NSAIDs Has no other complaints or medical concerns today. BP Readings from Last 3 Encounters:  07/12/23 138/78  05/11/23 (!) 157/68  03/08/23 (!) 154/84     HPI   Prior to Admission medications   Medication Sig Start Date End Date Taking? Authorizing Provider  acetaminophen  (TYLENOL ) 500 MG tablet Take 500 mg by mouth every 6 (six) hours as needed.   Yes [provider]  amLODipine  (NORVASC ) 5 MG tablet TAKE 1 TABLET (5 MG TOTAL) BY MOUTH DAILY. 03/17/23 03/11/24 Yes Shephanie Romas, Isidro Margo, MD  baclofen  (LIORESAL ) 10 MG tablet Take 1 tablet (10 mg total) by mouth 3 (three) times daily as needed for muscle spasms. 01/11/23  Yes Leinaala Catanese Jose, MD  Calcium Citrate-Vitamin D (CALCIUM CITRATE + D PO) Take 1 tablet by mouth daily.   Yes [provider]  CHELATED IRON PO Take 27 mg by mouth daily.   Yes [provider]  fluticasone  (FLONASE ) 50 MCG/ACT nasal spray Place 2 sprays into both nostrils daily as needed for allergies or rhinitis. 05/27/21  Yes Alexa Golebiewski Jose, MD  Krill Oil 300 MG CAPS Take 300 mg by mouth daily.   Yes [provider]  Multiple Vitamins-Minerals (CENTRUM SILVER 50+WOMEN PO) Take 1 tablet by mouth daily.   Yes [provider]  valsartan -hydrochlorothiazide  (DIOVAN -HCT) 320-12.5 MG tablet Take 1 tablet by mouth daily. 02/28/23  Yes Roslyn Coombe,  MD  nortriptyline  (PAMELOR ) 10 MG capsule Take 2 capsules (20 mg total) by mouth at bedtime. 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   Status post revision of total replacement of left knee 10/08/2021   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   Stage 3a chronic kidney disease (HCC) 05/27/2021   History of multiple myeloma 05/27/2021   Primary osteoarthritis of left knee 10/07/2014   History of left knee replacement 10/07/2014   Asthma, chronic 08/30/2012   Essential hypertension 08/30/2012   Osteoarthritis of right knee 08/30/2012   OSA on CPAP 08/30/2012   Severe obesity (BMI >= 40) (HCC) 08/30/2012    Past Medical History:  Diagnosis Date   Anemia 09/24/1968   after childbirth   Arthritis    feels like all over   Carpal  tunnel syndrome    Carpal tunnel syndrome    left   Cataract    immature on right   Chronic bronchitis (HCC)    get it most years (10/08/2014)   Chronic kidney disease    stage III, per Pt   Family history of anesthesia complication    daughter gets sick   History of blood transfusion    w/one of my back OR's   Hypertension    takes Hyzaar daily   Joint pain    all over   Joint swelling    Muscle spasms of lower extremity    takes Baclofen  daily prn   Nocturia    OSA on CPAP    Peripheral vascular disease (HCC)    PFO (patent foramen ovale)    positive saline bubble  study 05/12/09 ([facility])   Pneumonia 2010/ 2011   PONV (postoperative nausea and vomiting)    woke up during hysterectomy   Shortness of breath    with exertion;Albuterol  prn   Urinary frequency     Past Surgical History:  Procedure Laterality Date   ABDOMINAL HYSTERECTOMY  1970's?   APPENDECTOMY     BACK SURGERY     BREAST BIOPSY Right 1990's   CESAREAN SECTION  1962; 1963; 1970   CHEST TUBE INSERTION  80's   d/t pneumothorax   COLONOSCOPY     FRACTURE SURGERY     HARDWARE REMOVAL Right 1990's   took screw out of my lower leg ~ 1 yr after putting it in   HEMILAMINOTOMY LUMBAR SPINE  02/2002   L5; decompression of the L5 and S1 nerve root; synovial cyst excision/notes 06/09/2010   KNEE ARTHROSCOPY Right ?2013   LUMBAR LAMINECTOMY/DECOMPRESSION MICRODISCECTOMY  02/2010   POSTERIOR LUMBAR FUSION  05/2003   L4-5 and S-1    TIBIA FRACTURE SURGERY Right 1990's   put screw in   TOTAL KNEE ARTHROPLASTY Right 08/28/2012   Procedure: TOTAL KNEE ARTHROPLASTY;  Surgeon: Shirlee Dotter, MD;  Location: [facility] OR;  Service: Orthopedics;  Laterality: Right;   TOTAL KNEE ARTHROPLASTY Left 10/07/2014   TOTAL KNEE ARTHROPLASTY Left 10/07/2014   Procedure: TOTAL KNEE ARTHROPLASTY;  Surgeon: Shirlee Dotter, MD;  Location: [facility] OR;  Service: Orthopedics;  Laterality: Left;   TOTAL KNEE REVISION Left 10/08/2021   Procedure: LEFT TOTAL KNEE REVISION;  Surgeon: Arnie Lao, MD;  Location: [facility] ORS;  Service: Orthopedics;  Laterality: Left;    Social History   Socioeconomic History   Marital status: Divorced    Spouse name: Not on file   Number of children: Not on file   Years of education: Not on file   Highest education level: 12th grade  Occupational History   Not on file  Tobacco Use   Smoking status: Never    Passive exposure: Never   Smokeless tobacco: Never  Vaping Use   Vaping status: Never Used  Substance and Sexual Activity   Alcohol  use: No   Drug use: No   Sexual  activity: Not Currently  Other Topics Concern   Not on file  Social History Narrative   Are you right handed or left handed? Right   Are you currently employed ?    What is your current occupation? Retired/disability   Do you live at home alone?   Who lives with you? daughter   What type of home do you live in: 1 story or 2 story? two   Caffeine I in am  Social Drivers of Corporate investment banker Strain: Low Risk  (07/09/2023)   Overall Financial Resource Strain (CARDIA)    Difficulty of Paying Living Expenses: Not hard at all  Food Insecurity: No Food Insecurity (07/09/2023)   Hunger Vital Sign    Worried About Running Out of Food in the Last Year: Never true    Ran Out of Food in the Last Year: Never true  Transportation Needs: No Transportation Needs (07/09/2023)   PRAPARE - Administrator, Civil Service (Medical): No    Lack of Transportation (Non-Medical): No  Physical Activity: Insufficiently Active (07/09/2023)   Exercise Vital Sign    Days of Exercise per Week: 2 days    Minutes of Exercise per Session: 20 min  Stress: Stress Concern Present (07/09/2023)   [employer] of Occupational Health - Occupational Stress Questionnaire    Feeling of Stress: To some extent  Social Connections: Moderately Isolated (07/09/2023)   Social Connection and Isolation Panel    Frequency of Communication with Friends and Family: More than three times a week    Frequency of Social Gatherings with Friends and Family: Once a week    Attends Religious Services: More than 4 times per year    Active Member of [employer] or Organizations: No    Attends Banker Meetings: Not on file    Marital Status: Divorced  Intimate Partner Violence: Not At Risk (04/17/2023)   Humiliation, Afraid, Rape, and Kick questionnaire    Fear of Current or Ex-Partner: No    Emotionally Abused: No    Physically Abused: No    Sexually Abused: No    No family history on file.   Review of  Systems  Constitutional: Negative.  Negative for chills, fever and weight loss.  HENT: Negative.  Negative for congestion and sore throat.   Respiratory: Negative.  Negative for cough and shortness of breath.   Cardiovascular: Negative.  Negative for chest pain and palpitations.  Gastrointestinal:  Negative for abdominal pain, diarrhea, nausea and vomiting.  Genitourinary: Negative.  Negative for dysuria and hematuria.  Musculoskeletal:  Positive for back pain.  Skin: Negative.  Negative for rash.  Neurological: Negative.  Negative for dizziness and headaches.  All other systems reviewed and are negative.   Vitals:   07/12/23 1034  BP: 138/78  Pulse: 84  Temp: 98.4 F (36.9 C)  SpO2: 98%    Physical Exam Vitals reviewed.  Constitutional:      Appearance: Normal appearance.  HENT:     Head: Normocephalic.     Mouth/Throat:     Mouth: Mucous membranes are moist.     Pharynx: Oropharynx is clear.   Eyes:     Extraocular Movements: Extraocular movements intact.     Pupils: Pupils are equal, round, and reactive to light.    Cardiovascular:     Rate and Rhythm: Normal rate and regular rhythm.     Pulses: Normal pulses.     Heart sounds: Normal heart sounds.  Pulmonary:     Effort: Pulmonary effort is normal.     Breath sounds: Normal breath sounds.   Musculoskeletal:     Cervical back: No tenderness.  Lymphadenopathy:     Cervical: No cervical adenopathy.   Skin:    General: Skin is warm and dry.     Capillary Refill: Capillary refill takes less than 2 seconds.   Neurological:     General: No focal deficit present.     Mental  Status: She is alert and oriented to person, place, and time.   Psychiatric:        Behavior: Behavior normal.      ASSESSMENT & PLAN: A total of 44 minutes was spent with the patient and counseling/coordination of care regarding preparing for this visit, review of most recent office visit notes, review of multiple chronic medical  conditions and their management, review of all medications, review of most recent bloodwork results, review of health maintenance items, education on nutrition, prognosis, documentation, and need for follow up.  Problem List Items Addressed This Visit       Cardiovascular and Mediastinum   Essential hypertension - Primary   BP Readings from Last 3 Encounters:  07/12/23 138/78  05/11/23 (!) 157/68  03/08/23 (!) 154/84  Well-controlled hypertension. Continue amlodipine  5 mg daily and valsartan  HCT 320-12.5 mg daily Cardiovascular risks associated with hypertension discussed Diet and nutrition discussed         Respiratory   OSA on CPAP   Stable on CPAP treatment.        Genitourinary   Stage 3a chronic kidney disease (HCC)   Chronic stable condition Advised to stay well-hydrated and avoid NSAIDs        Other   History of multiple myeloma   Recent metastatic bone survey does not show lytic lesions.  Stable chronic condition Sees oncologist on a regular basis      Smoldering multiple myeloma   Stable without complications. No recent infections. Follows up with oncologist on a regular basis Last office visit notes reviewed      Patient Instructions  Health Maintenance After Age 50 After age 57, you are at a higher risk for certain long-term diseases and infections as well as injuries from falls. Falls are a major cause of broken bones and head injuries in people who are older than age 89. Getting regular preventive care can help to keep you healthy and well. Preventive care includes getting regular testing and making lifestyle changes as recommended by your health care provider. Talk with your health care provider about: Which screenings and tests you should have. A screening is a test that checks for a disease when you have no symptoms. A diet and exercise plan that is right for you. What should I know about screenings and tests to prevent falls? Screening and testing  are the best ways to find a health problem early. Early diagnosis and treatment give you the best chance of managing medical conditions that are common after age 88. Certain conditions and lifestyle choices may make you more likely to have a fall. Your health care provider may recommend: Regular vision checks. Poor vision and conditions such as cataracts can make you more likely to have a fall. If you wear glasses, make sure to get your prescription updated if your vision changes. Medicine review. Work with your health care provider to regularly review all of the medicines you are taking, including over-the-counter medicines. Ask your health care provider about any side effects that may make you more likely to have a fall. Tell your health care provider if any medicines that you take make you feel dizzy or sleepy. Strength and balance checks. Your health care provider may recommend certain tests to check your strength and balance while standing, walking, or changing positions. Foot health exam. Foot pain and numbness, as well as not wearing proper footwear, can make you more likely to have a fall. Screenings, including: Osteoporosis screening. Osteoporosis is a  condition that causes the bones to get weaker and break more easily. Blood pressure screening. Blood pressure changes and medicines to control blood pressure can make you feel dizzy. Depression screening. You may be more likely to have a fall if you have a fear of falling, feel depressed, or feel unable to do activities that you used to do. Alcohol  use screening. Using too much alcohol  can affect your balance and may make you more likely to have a fall. Follow these instructions at home: Lifestyle Do not drink alcohol  if: Your health care provider tells you not to drink. If you drink alcohol : Limit how much you have to: 0-1 drink a day for women. 0-2 drinks a day for men. Know how much alcohol  is in your drink. In the [place] one drink equals  one 12 oz bottle of beer (355 mL), one 5 oz glass of wine (148 mL), or one 1 oz glass of hard liquor (44 mL). Do not use any products that contain nicotine or tobacco. These products include cigarettes, chewing tobacco, and vaping devices, such as e-cigarettes. If you need help quitting, ask your health care provider. Activity  Follow a regular exercise program to stay fit. This will help you maintain your balance. Ask your health care provider what types of exercise are appropriate for you. If you need a cane or walker, use it as recommended by your health care provider. Wear supportive shoes that have nonskid soles. Safety  Remove any tripping hazards, such as rugs, cords, and clutter. Install safety equipment such as grab bars in bathrooms and safety rails on stairs. Keep rooms and walkways well-lit. General instructions Talk with your health care provider about your risks for falling. Tell your health care provider if: You fall. Be sure to tell your health care provider about all falls, even ones that seem minor. You feel dizzy, tiredness (fatigue), or off-balance. Take over-the-counter and prescription medicines only as told by your health care provider. These include supplements. Eat a healthy diet and maintain a healthy weight. A healthy diet includes low-fat dairy products, low-fat (lean) meats, and fiber from whole grains, beans, and lots of fruits and vegetables. Stay current with your vaccines. Schedule regular health, dental, and eye exams. Summary Having a healthy lifestyle and getting preventive care can help to protect your health and wellness after age 77. Screening and testing are the best way to find a health problem early and help you avoid having a fall. Early diagnosis and treatment give you the best chance for managing medical conditions that are more common for people who are older than age 60. Falls are a major cause of broken bones and head injuries in people who are  older than age 43. Take precautions to prevent a fall at home. Work with your health care provider to learn what changes you can make to improve your health and wellness and to prevent falls. This information is not intended to replace advice given to you by your health care provider. Make sure you discuss any questions you have with your health care provider. Document Revised: 06/01/2020 Document Reviewed: 06/01/2020 Elsevier Patient Education  2024 Elsevier Inc.       Diane Small, MD [facility]

## 2023-07-12 NOTE — Patient Instructions (Signed)
 Health Maintenance After Age 81 After age 4, you are at a higher risk for certain long-term diseases and infections as well as injuries from falls. Falls are a major cause of broken bones and head injuries in people who are older than age 47. Getting regular preventive care can help to keep you healthy and well. Preventive care includes getting regular testing and making lifestyle changes as recommended by your health care provider. Talk with your health care provider about: Which screenings and tests you should have. A screening is a test that checks for a disease when you have no symptoms. A diet and exercise plan that is right for you. What should I know about screenings and tests to prevent falls? Screening and testing are the best ways to find a health problem early. Early diagnosis and treatment give you the best chance of managing medical conditions that are common after age 37. Certain conditions and lifestyle choices may make you more likely to have a fall. Your health care provider may recommend: Regular vision checks. Poor vision and conditions such as cataracts can make you more likely to have a fall. If you wear glasses, make sure to get your prescription updated if your vision changes. Medicine review. Work with your health care provider to regularly review all of the medicines you are taking, including over-the-counter medicines. Ask your health care provider about any side effects that may make you more likely to have a fall. Tell your health care provider if any medicines that you take make you feel dizzy or sleepy. Strength and balance checks. Your health care provider may recommend certain tests to check your strength and balance while standing, walking, or changing positions. Foot health exam. Foot pain and numbness, as well as not wearing proper footwear, can make you more likely to have a fall. Screenings, including: Osteoporosis screening. Osteoporosis is a condition that causes  the bones to get weaker and break more easily. Blood pressure screening. Blood pressure changes and medicines to control blood pressure can make you feel dizzy. Depression screening. You may be more likely to have a fall if you have a fear of falling, feel depressed, or feel unable to do activities that you used to do. Alcohol use screening. Using too much alcohol can affect your balance and may make you more likely to have a fall. Follow these instructions at home: Lifestyle Do not drink alcohol if: Your health care provider tells you not to drink. If you drink alcohol: Limit how much you have to: 0-1 drink a day for women. 0-2 drinks a day for men. Know how much alcohol is in your drink. In the [place] one drink equals one 12 oz bottle of beer (355 mL), one 5 oz glass of wine (148 mL), or one 1 oz glass of hard liquor (44 mL). Do not use any products that contain nicotine or tobacco. These products include cigarettes, chewing tobacco, and vaping devices, such as e-cigarettes. If you need help quitting, ask your health care provider. Activity  Follow a regular exercise program to stay fit. This will help you maintain your balance. Ask your health care provider what types of exercise are appropriate for you. If you need a cane or walker, use it as recommended by your health care provider. Wear supportive shoes that have nonskid soles. Safety  Remove any tripping hazards, such as rugs, cords, and clutter. Install safety equipment such as grab bars in bathrooms and safety rails on stairs. Keep rooms and walkways  well-lit. General instructions Talk with your health care provider about your risks for falling. Tell your health care provider if: You fall. Be sure to tell your health care provider about all falls, even ones that seem minor. You feel dizzy, tiredness (fatigue), or off-balance. Take over-the-counter and prescription medicines only as told by your health care provider. These include  supplements. Eat a healthy diet and maintain a healthy weight. A healthy diet includes low-fat dairy products, low-fat (lean) meats, and fiber from whole grains, beans, and lots of fruits and vegetables. Stay current with your vaccines. Schedule regular health, dental, and eye exams. Summary Having a healthy lifestyle and getting preventive care can help to protect your health and wellness after age 11. Screening and testing are the best way to find a health problem early and help you avoid having a fall. Early diagnosis and treatment give you the best chance for managing medical conditions that are more common for people who are older than age 28. Falls are a major cause of broken bones and head injuries in people who are older than age 48. Take precautions to prevent a fall at home. Work with your health care provider to learn what changes you can make to improve your health and wellness and to prevent falls. This information is not intended to replace advice given to you by your health care provider. Make sure you discuss any questions you have with your health care provider. Document Revised: 06/01/2020 Document Reviewed: 06/01/2020 Elsevier Patient Education  2024 [employer].

## 2023-07-12 NOTE — Assessment & Plan Note (Signed)
Stable without complications. No recent infections. Follows up with oncologist on a regular basis Last office visit notes reviewed

## 2023-07-12 NOTE — Assessment & Plan Note (Addendum)
 BP Readings from Last 3 Encounters:  07/12/23 138/78  05/11/23 (!) 157/68  03/08/23 (!) 154/84  Well-controlled hypertension. Continue amlodipine  5 mg daily and valsartan  HCT 320-12.5 mg daily Cardiovascular risks associated with hypertension discussed Diet and nutrition discussed

## 2023-07-31 DIAGNOSIS — I10 Essential (primary) hypertension: Secondary | ICD-10-CM | POA: Diagnosis not present

## 2023-07-31 DIAGNOSIS — G4733 Obstructive sleep apnea (adult) (pediatric): Secondary | ICD-10-CM | POA: Diagnosis not present

## 2023-08-04 ENCOUNTER — Telehealth: Payer: Self-pay | Admitting: Hematology and Oncology

## 2023-08-04 NOTE — Telephone Encounter (Signed)
 I informed Diane Daugherty that we have re scheduled her lab appointment with a 15 minute time difference.

## 2023-08-09 DIAGNOSIS — H16223 Keratoconjunctivitis sicca, not specified as Sjogren's, bilateral: Secondary | ICD-10-CM | POA: Diagnosis not present

## 2023-08-09 DIAGNOSIS — H5203 Hypermetropia, bilateral: Secondary | ICD-10-CM | POA: Diagnosis not present

## 2023-08-09 DIAGNOSIS — H35033 Hypertensive retinopathy, bilateral: Secondary | ICD-10-CM | POA: Diagnosis not present

## 2023-08-09 DIAGNOSIS — H524 Presbyopia: Secondary | ICD-10-CM | POA: Diagnosis not present

## 2023-08-09 DIAGNOSIS — H52223 Regular astigmatism, bilateral: Secondary | ICD-10-CM | POA: Diagnosis not present

## 2023-08-09 DIAGNOSIS — H2513 Age-related nuclear cataract, bilateral: Secondary | ICD-10-CM | POA: Diagnosis not present

## 2023-08-15 ENCOUNTER — Inpatient Hospital Stay: Payer: HMO | Attending: Hematology and Oncology

## 2023-08-15 ENCOUNTER — Other Ambulatory Visit: Payer: Self-pay | Admitting: Hematology and Oncology

## 2023-08-15 DIAGNOSIS — C9000 Multiple myeloma not having achieved remission: Secondary | ICD-10-CM | POA: Diagnosis not present

## 2023-08-15 DIAGNOSIS — D472 Monoclonal gammopathy: Secondary | ICD-10-CM

## 2023-08-15 LAB — CBC WITH DIFFERENTIAL (CANCER CENTER ONLY)
Abs Immature Granulocytes: 0.01 K/uL (ref 0.00–0.07)
Basophils Absolute: 0 K/uL (ref 0.0–0.1)
Basophils Relative: 1 %
Eosinophils Absolute: 0.1 K/uL (ref 0.0–0.5)
Eosinophils Relative: 2 %
HCT: 32.8 % — ABNORMAL LOW (ref 36.0–46.0)
Hemoglobin: 11.1 g/dL — ABNORMAL LOW (ref 12.0–15.0)
Immature Granulocytes: 0 %
Lymphocytes Relative: 34 %
Lymphs Abs: 2.3 K/uL (ref 0.7–4.0)
MCH: 31.5 pg (ref 26.0–34.0)
MCHC: 33.8 g/dL (ref 30.0–36.0)
MCV: 93.2 fL (ref 80.0–100.0)
Monocytes Absolute: 0.4 K/uL (ref 0.1–1.0)
Monocytes Relative: 6 %
Neutro Abs: 3.8 K/uL (ref 1.7–7.7)
Neutrophils Relative %: 57 %
Platelet Count: 190 K/uL (ref 150–400)
RBC: 3.52 MIL/uL — ABNORMAL LOW (ref 3.87–5.11)
RDW: 14.5 % (ref 11.5–15.5)
WBC Count: 6.7 K/uL (ref 4.0–10.5)
nRBC: 0 % (ref 0.0–0.2)

## 2023-08-15 LAB — CMP (CANCER CENTER ONLY)
ALT: 10 U/L (ref 0–44)
AST: 15 U/L (ref 15–41)
Albumin: 3.9 g/dL (ref 3.5–5.0)
Alkaline Phosphatase: 61 U/L (ref 38–126)
Anion gap: 4 — ABNORMAL LOW (ref 5–15)
BUN: 26 mg/dL — ABNORMAL HIGH (ref 8–23)
CO2: 31 mmol/L (ref 22–32)
Calcium: 9.7 mg/dL (ref 8.9–10.3)
Chloride: 104 mmol/L (ref 98–111)
Creatinine: 1.32 mg/dL — ABNORMAL HIGH (ref 0.44–1.00)
GFR, Estimated: 41 mL/min — ABNORMAL LOW (ref >60)
Glucose, Bld: 104 mg/dL — ABNORMAL HIGH (ref 70–99)
Potassium: 4.9 mmol/L (ref 3.5–5.1)
Sodium: 139 mmol/L (ref 135–145)
Total Bilirubin: 0.4 mg/dL (ref 0.0–1.2)
Total Protein: 7.9 g/dL (ref 6.5–8.1)

## 2023-08-15 LAB — LACTATE DEHYDROGENASE: LDH: 336 U/L — ABNORMAL HIGH (ref 98–192)

## 2023-08-16 LAB — KAPPA/LAMBDA LIGHT CHAINS
Kappa free light chain: 72.5 mg/L — ABNORMAL HIGH (ref 3.3–19.4)
Kappa, lambda light chain ratio: 12.29 — ABNORMAL HIGH (ref 0.26–1.65)
Lambda free light chains: 5.9 mg/L (ref 5.7–26.3)

## 2023-08-17 LAB — MULTIPLE MYELOMA PANEL, SERUM
Albumin SerPl Elph-Mcnc: 3.8 g/dL (ref 2.9–4.4)
Albumin/Glob SerPl: 1.1 (ref 0.7–1.7)
Alpha 1: 0.2 g/dL (ref 0.0–0.4)
Alpha2 Glob SerPl Elph-Mcnc: 0.7 g/dL (ref 0.4–1.0)
B-Globulin SerPl Elph-Mcnc: 0.8 g/dL (ref 0.7–1.3)
Gamma Glob SerPl Elph-Mcnc: 2 g/dL — ABNORMAL HIGH (ref 0.4–1.8)
Globulin, Total: 3.7 g/dL (ref 2.2–3.9)
IgA: 32 mg/dL — ABNORMAL LOW (ref 64–422)
IgG (Immunoglobin G), Serum: 2429 mg/dL — ABNORMAL HIGH (ref 586–1602)
IgM (Immunoglobulin M), Srm: 15 mg/dL — ABNORMAL LOW (ref 26–217)
M Protein SerPl Elph-Mcnc: 1.7 g/dL — ABNORMAL HIGH
Total Protein ELP: 7.5 g/dL (ref 6.0–8.5)

## 2023-08-23 ENCOUNTER — Inpatient Hospital Stay: Payer: HMO | Admitting: Hematology and Oncology

## 2023-08-23 VITALS — BP 168/72 | HR 88 | Temp 97.7°F | Resp 15 | Wt 223.2 lb

## 2023-08-23 DIAGNOSIS — D472 Monoclonal gammopathy: Secondary | ICD-10-CM | POA: Diagnosis not present

## 2023-08-23 DIAGNOSIS — C9000 Multiple myeloma not having achieved remission: Secondary | ICD-10-CM | POA: Diagnosis not present

## 2023-08-23 NOTE — Progress Notes (Signed)
 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 Federico. Kappa 77.3, Lambda 6.6, ratio 11.71.  09/25/2020: met survey shows no clear lytic lesions 10/01/2020: BmBx showed mildly hypercellular marrow involved by kappa-restricted plasma cell neoplasm (20%)  Interval History:  Diane Daugherty 83 y.o. female with medical history significant for smoldering multiple myeloma who presents for a follow up visit. The patient's last visit was on 02/22/2023. In the interim since the last visit she has had no major changes in her health.  On exam today Diane Daugherty reports she has been well overall in the interim since her last visit.  She reports however her energy levels can be low and she feels quite cold.  She reports that she is freezing and has to sit on the porch to stay warm.  She notes that she has had no overt signs of bleeding, bruising, or dark stools.  She notes that she does have some occasional pain in the hips but no spine or back pain.  She is not having any urinary issues such as bubbling, foaming, or change in the color of the urine.  She reports no other changes in her health out of the ordinary.  Otherwise a full 10 point ROS is negative.  MEDICAL HISTORY:  Past Medical History:  Diagnosis Date   Anemia 09/24/1968   after childbirth   Arthritis    feels like all over   Carpal tunnel syndrome    Carpal tunnel syndrome    left   Cataract    immature  on right   Chronic bronchitis (HCC)    get it most years (10/08/2014)   Chronic kidney disease    stage III, per Pt   Family history of anesthesia complication    daughter gets sick   History of blood transfusion    w/one of my back OR's   Hypertension    takes Hyzaar daily   Joint pain    all over   Joint swelling    Muscle spasms of lower extremity    takes Baclofen  daily prn   Nocturia    OSA on CPAP    Peripheral vascular disease (HCC)    PFO (patent foramen ovale)    positive saline bubble study 05/12/09 ([facility])   Pneumonia 2010/ 2011   PONV (postoperative nausea and vomiting)    woke up during hysterectomy   Shortness of breath    with exertion;Albuterol  prn   Urinary frequency     SURGICAL HISTORY: Past Surgical History:  Procedure Laterality Date   ABDOMINAL HYSTERECTOMY  1970's?   APPENDECTOMY     BACK SURGERY     BREAST BIOPSY Right 1990's   CESAREAN SECTION  1962; 1963; 1970   CHEST TUBE INSERTION  80's   d/t pneumothorax   COLONOSCOPY     FRACTURE SURGERY     HARDWARE REMOVAL Right 1990's   took screw out of my lower leg ~ 1 yr after putting it in   HEMILAMINOTOMY LUMBAR SPINE  02/2002   L5; decompression of the L5  and S1 nerve root; synovial cyst excision/notes 06/09/2010   KNEE ARTHROSCOPY Right ?2013   LUMBAR LAMINECTOMY/DECOMPRESSION MICRODISCECTOMY  02/2010   POSTERIOR LUMBAR FUSION  05/2003   L4-5 and S-1    TIBIA FRACTURE SURGERY Right 1990's   put screw in   TOTAL KNEE ARTHROPLASTY Right 08/28/2012   Procedure: TOTAL KNEE ARTHROPLASTY;  Surgeon: Maude LELON Right, MD;  Location: [facility] OR;  Service: Orthopedics;  Laterality: Right;   TOTAL KNEE ARTHROPLASTY Left 10/07/2014   TOTAL KNEE ARTHROPLASTY Left 10/07/2014   Procedure: TOTAL KNEE ARTHROPLASTY;  Surgeon: Maude LELON Right, MD;  Location: [facility] OR;  Service: Orthopedics;  Laterality: Left;   TOTAL KNEE REVISION Left 10/08/2021   Procedure: LEFT TOTAL KNEE REVISION;  Surgeon: Vernetta Lonni GRADE, MD;  Location: [facility] ORS;  Service: Orthopedics;  Laterality: Left;    SOCIAL HISTORY: Social History   Socioeconomic History   Marital status: Divorced    Spouse name: Not on file   Number of children: Not on file   Years of education: Not on file   Highest education level: 12th grade  Occupational History   Not on file  Tobacco Use   Smoking status: Never    Passive exposure: Never   Smokeless tobacco: Never  Vaping Use   Vaping status: Never Used  Substance and Sexual Activity   Alcohol  use: No   Drug use: No   Sexual activity: Not Currently  Other Topics Concern   Not on file  Social History Narrative   Are you right handed or left handed? Right   Are you currently employed ?    What is your current occupation? Retired/disability   Do you live at home alone?   Who lives with you? daughter   What type of home do you live in: 1 story or 2 story? two   Caffeine I in am    Social Drivers of Health   Financial Resource Strain: Low Risk  (07/09/2023)   Overall Financial Resource Strain (CARDIA)    Difficulty of Paying Living Expenses: Not hard at all  Food Insecurity: No Food Insecurity (07/09/2023)   Hunger Vital Sign    Worried About Running Out of Food in the Last Year: Never true    Ran Out of Food in the Last Year: Never true  Transportation Needs: No Transportation Needs (07/09/2023)   PRAPARE - Administrator, Civil Service (Medical): No    Lack of Transportation (Non-Medical): No  Physical Activity: Insufficiently Active (07/09/2023)   Exercise Vital Sign    Days of Exercise per Week: 2 days    Minutes of Exercise per Session: 20 min  Stress: Stress Concern Present (07/09/2023)   [employer] of Occupational Health - Occupational Stress Questionnaire    Feeling of Stress: To some extent  Social Connections: Moderately Isolated (07/09/2023)   Social Connection and Isolation Panel    Frequency of Communication with Friends and Family:  More than three times a week    Frequency of Social Gatherings with Friends and Family: Once a week    Attends Religious Services: More than 4 times per year    Active Member of [employer] or Organizations: No    Attends Banker Meetings: Not on file    Marital Status: Divorced  Intimate Partner Violence: Not At Risk (04/17/2023)   Humiliation, Afraid, Rape, and Kick questionnaire    Fear of Current or Ex-Partner: No    Emotionally Abused: No    Physically  Abused: No    Sexually Abused: No    FAMILY HISTORY: No family history on file.  ALLERGIES:  is allergic to levaquin [levofloxacin], morphine  and codeine, other, oxycodone, tramadol , and naprosyn [naproxen].  MEDICATIONS:  Current Outpatient Medications  Medication Sig Dispense Refill   acetaminophen  (TYLENOL ) 500 MG tablet Take 500 mg by mouth every 6 (six) hours as needed.     amLODipine  (NORVASC ) 5 MG tablet TAKE 1 TABLET (5 MG TOTAL) BY MOUTH DAILY. 90 tablet 3   baclofen  (LIORESAL ) 10 MG tablet Take 1 tablet (10 mg total) by mouth 3 (three) times daily as needed for muscle spasms. 20 tablet 0   Calcium Citrate-Vitamin D (CALCIUM CITRATE + D PO) Take 1 tablet by mouth daily.     CHELATED IRON PO Take 27 mg by mouth daily.     fluticasone  (FLONASE ) 50 MCG/ACT nasal spray Place 2 sprays into both nostrils daily as needed for allergies or rhinitis. 16 g 1   Krill Oil 300 MG CAPS Take 300 mg by mouth daily.     Multiple Vitamins-Minerals (CENTRUM SILVER 50+WOMEN PO) Take 1 tablet by mouth daily.     nortriptyline  (PAMELOR ) 10 MG capsule Take 2 capsules (20 mg total) by mouth at bedtime. Take 1 capsule (10 mg) at bedtime for 1 week, then increase to 2 capsules (20 mg) at bedtime thereafter (Patient not taking: Reported on 07/12/2023) 60 capsule 4   valsartan -hydrochlorothiazide  (DIOVAN -HCT) 320-12.5 MG tablet Take 1 tablet by mouth daily.     No current facility-administered medications for this visit.    REVIEW OF SYSTEMS:    Constitutional: ( - ) fevers, ( - )  chills , ( - ) night sweats Eyes: ( - ) blurriness of vision, ( - ) double vision, ( - ) watery eyes Ears, nose, mouth, throat, and face: ( - ) mucositis, ( - ) sore throat Respiratory: ( - ) cough, ( - ) dyspnea, ( - ) wheezes Cardiovascular: ( - ) palpitation, ( - ) chest discomfort, ( - ) lower extremity swelling Gastrointestinal:  ( - ) nausea, ( - ) heartburn, ( - ) change in bowel habits Skin: ( - ) abnormal skin rashes Lymphatics: ( - ) new lymphadenopathy, ( - ) easy bruising Neurological: ( - ) numbness, ( - ) tingling, ( - ) new weaknesses Behavioral/Psych: ( - ) mood change, ( - ) new changes  All other systems were reviewed with the patient and are negative.  PHYSICAL EXAMINATION:  Vitals:   08/23/23 1108  BP: (!) 168/72  Pulse: 88  Resp: 15  Temp: 97.7 F (36.5 C)  SpO2: 98%      Filed Weights   08/23/23 1108  Weight: 223 lb 3.2 oz (101.2 kg)       GENERAL: Well-appearing elderly African-American female, alert, no distress and comfortable SKIN: skin color, texture, turgor are normal, no rashes or significant lesions EYES: conjunctiva are pink and non-injected, sclera clear LUNGS: clear to auscultation and percussion with normal breathing effort HEART: regular rate & rhythm and no murmurs and no lower extremity edema Musculoskeletal: no cyanosis of digits and no clubbing  PSYCH: alert & oriented x 3, fluent speech NEURO: no focal motor/sensory deficits  LABORATORY DATA:  I have reviewed the data as listed    Latest Ref Rng & Units 08/15/2023   10:44 AM 02/16/2023    1:19 PM 11/16/2022    4:13 PM  CBC  WBC 4.0 - 10.5 K/uL 6.7  7.8  8.1   Hemoglobin 12.0 - 15.0 g/dL 88.8  88.1  87.1   Hematocrit 36.0 - 46.0 % 32.8  34.4  39.1   Platelets 150 - 400 K/uL 190  205  221.0        Latest Ref Rng & Units 08/15/2023   10:44 AM 02/16/2023    1:19 PM 11/16/2022    4:13 PM  CMP  Glucose 70 - 99 mg/dL 895  884  896    BUN 8 - 23 mg/dL 26  31  13    Creatinine 0.44 - 1.00 mg/dL 8.67  8.63  8.91   Sodium 135 - 145 mmol/L 139  134  135   Potassium 3.5 - 5.1 mmol/L 4.9  4.4  4.0   Chloride 98 - 111 mmol/L 104  99  100   CO2 22 - 32 mmol/L 31  29  31    Calcium 8.9 - 10.3 mg/dL 9.7  89.7  89.7   Total Protein 6.5 - 8.1 g/dL 7.9  8.1  8.7   Total Bilirubin 0.0 - 1.2 mg/dL 0.4  0.4  0.4   Alkaline Phos 38 - 126 U/L 61  58  68   AST 15 - 41 U/L 15  16  15    ALT 0 - 44 U/L 10  10  10      Lab Results  Component Value Date   MPROTEIN 1.7 (H) 08/15/2023   MPROTEIN 1.5 (H) 02/16/2023   MPROTEIN 1.6 (H) 08/11/2022   Lab Results  Component Value Date   KPAFRELGTCHN 72.5 (H) 08/15/2023   KPAFRELGTCHN 76.8 (H) 02/16/2023   KPAFRELGTCHN 86.5 (H) 08/11/2022   LAMBDASER 5.9 08/15/2023   LAMBDASER 7.2 02/16/2023   LAMBDASER 9.0 08/11/2022   KAPLAMBRATIO 12.29 (H) 08/15/2023   KAPLAMBRATIO 4.29 02/19/2023   KAPLAMBRATIO 10.67 (H) 02/16/2023     RADIOGRAPHIC STUDIES: No results found.   ASSESSMENT & PLAN Diane Daugherty is a 83 y.o. female with medical history significant for smoldering multiple myeloma who presents for a follow up visit.  #Smoldering Multiple Myeloma -- The patient currently meets one of the three 20-2-20 criteria (bmbx with 20% plasma cells). Two criteria are required for treatment.  --labs from 08/15/2023 reviewed with patient. WBC 6.7, Hgb 11.1, MCV 93.2, Plt 190. Myeloma labs show stable M protein measuring 1.7 g/dL. Kappa light chain elevated at 72.5, lambda light chain 5.9, ratio 12.29 -- No clear indication for treatment as long as the patient does not meet this criteria or any of the CRAB criteria --Continue on observation. We will order SPEP, UPEP, serum free light chains, LDH, CMP and CBC with each visit.  --Most recent bone scan from 02/18/2022 showed no definitive lytic bone lesion. Next scan due at next visit.  --Recommend follow-up visit in 6 months time   No orders of the  defined types were placed in this encounter.  All questions were answered. The patient knows to call the clinic with any problems, questions or concerns.  I have spent a total of 25 minutes minutes of face-to-face and non-face-to-face time, preparing to see the patient,performing a medically appropriate examination, counseling and educating the patient,documenting clinical information in the electronic health record, and care coordination.   Norleen IVAR Kidney, MD Department of Hematology/Oncology [facility] Phone: [phone] Pager: [phone] Email: [email or web address]    08/29/2023 10:22 AM

## 2023-10-30 DIAGNOSIS — G4733 Obstructive sleep apnea (adult) (pediatric): Secondary | ICD-10-CM | POA: Diagnosis not present

## 2023-10-30 DIAGNOSIS — I10 Essential (primary) hypertension: Secondary | ICD-10-CM | POA: Diagnosis not present

## 2023-11-29 ENCOUNTER — Other Ambulatory Visit: Payer: Self-pay

## 2023-11-29 ENCOUNTER — Ambulatory Visit: Admitting: Physician Assistant

## 2023-11-29 DIAGNOSIS — M25562 Pain in left knee: Secondary | ICD-10-CM | POA: Diagnosis not present

## 2023-11-29 DIAGNOSIS — Z96659 Presence of unspecified artificial knee joint: Secondary | ICD-10-CM

## 2023-11-29 NOTE — Progress Notes (Signed)
 HPI: Diane Daugherty comes in today for left knee pain.  States her left knee pain began on 11/11/2023.  She states she awakened and had difficulty standing due to pain lateral aspect of her left knee.  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  She states overall that she is improving.  She is taking Tylenol  arthritis icing using heat to the area of maximal tenderness.  No injuries.  No fevers chills.  Unable to take NSAIDs due to stage III chronic kidney disease.  Review of systems: See HPI  Physical exam: General: No acute distress ambulates with a cane. Bilateral knees: Left knee full extension and flexion to just beyond 90 degrees.  Right knee full extension and flexion to approximately 85 degrees.  No instability with valgus varus stressing of either knee.  She has tenderness at the IT band insertion bilaterally left slightly more tender than the right.  Left knee anterior drawer is negative.  No abnormal warmth erythema or effusion of either knee.  Radiographs: Left knee 2 views: Status post knee replacement with revision stems appear well-seated.  No acute fractures or acute findings.  No lytic lesions.   Impression: Status post left total knee arthroplasty with revision  Plan: Recommend she continue conservative measures.  If pain persist or becomes worse could place cortisone injection at the IT band insertion left knee.  Encouraged to continue work on dance movement psychotherapist.  Follow-up as needed.

## 2023-12-31 ENCOUNTER — Other Ambulatory Visit: Payer: Self-pay | Admitting: Emergency Medicine

## 2023-12-31 DIAGNOSIS — I10 Essential (primary) hypertension: Secondary | ICD-10-CM

## 2024-01-11 ENCOUNTER — Ambulatory Visit: Admitting: Emergency Medicine

## 2024-01-11 ENCOUNTER — Encounter: Payer: Self-pay | Admitting: Emergency Medicine

## 2024-01-11 VITALS — BP 160/80 | HR 111 | Temp 98.1°F | Ht 61.0 in | Wt 219.0 lb

## 2024-01-11 DIAGNOSIS — J4520 Mild intermittent asthma, uncomplicated: Secondary | ICD-10-CM | POA: Diagnosis not present

## 2024-01-11 DIAGNOSIS — G4733 Obstructive sleep apnea (adult) (pediatric): Secondary | ICD-10-CM | POA: Diagnosis not present

## 2024-01-11 DIAGNOSIS — D472 Monoclonal gammopathy: Secondary | ICD-10-CM | POA: Diagnosis not present

## 2024-01-11 DIAGNOSIS — N1832 Chronic kidney disease, stage 3b: Secondary | ICD-10-CM | POA: Diagnosis not present

## 2024-01-11 DIAGNOSIS — F418 Other specified anxiety disorders: Secondary | ICD-10-CM | POA: Diagnosis not present

## 2024-01-11 DIAGNOSIS — Z96652 Presence of left artificial knee joint: Secondary | ICD-10-CM | POA: Diagnosis not present

## 2024-01-11 DIAGNOSIS — I10 Essential (primary) hypertension: Secondary | ICD-10-CM | POA: Diagnosis not present

## 2024-01-11 DIAGNOSIS — Z8579 Personal history of other malignant neoplasms of lymphoid, hematopoietic and related tissues: Secondary | ICD-10-CM

## 2024-01-11 MED ORDER — BACLOFEN 10 MG PO TABS: 10.0000 mg | ORAL_TABLET | Freq: Three times a day (TID) | ORAL | 0 refills | Status: AC | PRN

## 2024-01-11 MED ORDER — FLUTICASONE PROPIONATE 50 MCG/ACT NA SUSP: 2.0000 | Freq: Every day | NASAL | 1 refills | Status: AC | PRN

## 2024-01-11 NOTE — Assessment & Plan Note (Signed)
 Diet and nutrition discussed.  Advised to decrease amount of daily carbohydrate intake and daily calories and increase amount of plant-based protein in her diet.

## 2024-01-11 NOTE — Assessment & Plan Note (Signed)
 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  Stable chronic condition Sees oncologist on a regular basis

## 2024-01-11 NOTE — Progress Notes (Signed)
 Diane Daugherty 83 y.o.   Chief Complaint  Patient presents with   Follow-up    HISTORY OF PRESENT ILLNESS: This is a 83 y.o. female here for follow-up of multiple chronic medical conditions Presently dealing with stressful situation with neighbors who constantly smoke pot Still dealing with chronic pain to left knee No other complaints or medical concerns today.  HPI   Prior to Admission medications  Medication Sig Start Date End Date Taking? Authorizing Provider  acetaminophen  (TYLENOL ) 500 MG tablet Take 500 mg by mouth every 6 (six) hours as needed.   Yes Provider, Historical, Diane Daugherty  amLODipine  (NORVASC ) 5 MG tablet TAKE 1 TABLET (5 MG TOTAL) BY MOUTH DAILY. 03/17/23 03/11/24 Yes Khalen Styer, Diane Schanz, Diane Daugherty  baclofen  (LIORESAL ) 10 MG tablet Take 1 tablet (10 mg total) by mouth 3 (three) times daily as needed for muscle spasms. 01/11/23  Yes Coron Rossano Jose, Diane Daugherty  Calcium Citrate-Vitamin D (CALCIUM CITRATE + D PO) Take 1 tablet by mouth daily.   Yes Provider, Historical, Diane Daugherty  CHELATED IRON PO Take 27 mg by mouth daily.   Yes Provider, Historical, Diane Daugherty  fluticasone  (FLONASE ) 50 MCG/ACT nasal spray Place 2 sprays into both nostrils daily as needed for allergies or rhinitis. 05/27/21  Yes Zakiah Gauthreaux Jose, Diane Daugherty  Krill Oil 300 MG CAPS Take 300 mg by mouth daily.   Yes Provider, Historical, Diane Daugherty  Multiple Vitamins-Minerals (CENTRUM SILVER 50+WOMEN PO) Take 1 tablet by mouth daily.   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   Stage 3a chronic kidney disease (HCC) 05/27/2021   History of multiple myeloma 05/27/2021   Primary osteoarthritis of left  knee 10/07/2014   History of left knee replacement 10/07/2014   Asthma, chronic 08/30/2012   Essential hypertension 08/30/2012   Osteoarthritis of right knee 08/30/2012   OSA on CPAP 08/30/2012   Severe obesity (BMI >= 40) (HCC) 08/30/2012    Past Medical History:  Diagnosis Date   Anemia 09/24/1968   after childbirth   Arthritis    feels like all over   Carpal tunnel syndrome    Carpal tunnel syndrome    left   Cataract    immature on right   Chronic bronchitis (HCC)    get it most years (10/08/2014)   Chronic kidney disease    stage III, per Pt   Family history of anesthesia complication    daughter gets sick   History of blood transfusion    w/one of my back OR's   Hypertension    takes Hyzaar daily   Joint pain    all over   Joint swelling    Muscle spasms of lower extremity    takes Baclofen  daily prn   Nocturia    OSA on CPAP    Peripheral vascular disease    PFO (patent foramen ovale)    positive saline bubble study 05/12/09 ([facility])   Pneumonia 2010/ 2011   PONV (postoperative nausea and vomiting)    woke up during hysterectomy   Shortness of breath    with exertion;Albuterol  prn   Urinary frequency     Past Surgical History:  Procedure Laterality Date  ABDOMINAL HYSTERECTOMY  1970's?   APPENDECTOMY     BACK SURGERY     BREAST BIOPSY Right 1990's   CESAREAN SECTION  1962; 1963; 1970   CHEST TUBE INSERTION  80's   d/t pneumothorax   COLONOSCOPY     FRACTURE SURGERY     HARDWARE REMOVAL Right 1990's   took screw out of my lower leg ~ 1 yr after putting it in   HEMILAMINOTOMY LUMBAR SPINE  02/2002   L5; decompression of the L5 and S1 nerve root; synovial cyst excision/notes 06/09/2010   KNEE ARTHROSCOPY Right ?2013   LUMBAR LAMINECTOMY/DECOMPRESSION MICRODISCECTOMY  02/2010   POSTERIOR LUMBAR FUSION  05/2003   L4-5 and S-1    TIBIA FRACTURE SURGERY Right 1990's   put screw in   TOTAL KNEE ARTHROPLASTY Right 08/28/2012   Procedure:  TOTAL KNEE ARTHROPLASTY;  Surgeon: Maude LELON Right, Diane Daugherty;  Location: [facility] OR;  Service: Orthopedics;  Laterality: Right;   TOTAL KNEE ARTHROPLASTY Left 10/07/2014   TOTAL KNEE ARTHROPLASTY Left 10/07/2014   Procedure: TOTAL KNEE ARTHROPLASTY;  Surgeon: Maude LELON Right, Diane Daugherty;  Location: [facility] OR;  Service: Orthopedics;  Laterality: Left;   TOTAL KNEE REVISION Left 10/08/2021   Procedure: LEFT TOTAL KNEE REVISION;  Surgeon: Vernetta Lonni GRADE, Diane Daugherty;  Location: [facility] ORS;  Service: Orthopedics;  Laterality: Left;    Social History   Socioeconomic History   Marital status: Divorced    Spouse name: Not on file   Number of children: Not on file   Years of education: Not on file   Highest education level: 12th grade  Occupational History   Not on file  Tobacco Use   Smoking status: Never    Passive exposure: Never   Smokeless tobacco: Never  Vaping Use   Vaping status: Never Used  Substance and Sexual Activity   Alcohol  use: No   Drug use: No   Sexual activity: Not Currently  Other Topics Concern   Not on file  Social History Narrative   Are you right handed or left handed? Right   Are you currently employed ?    What is your current occupation? Retired/disability   Do you live at home alone?   Who lives with you? daughter   What type of home do you live in: 1 story or 2 story? two   Caffeine I in am    Social Drivers of Health   Tobacco Use: Low Risk (01/11/2024)   Patient History    Smoking Tobacco Use: Never    Smokeless Tobacco Use: Never    Passive Exposure: Never  Financial Resource Strain: Low Risk (01/07/2024)   Overall Financial Resource Strain (CARDIA)    Difficulty of Paying Living Expenses: Not hard at all  Food Insecurity: No Food Insecurity (01/07/2024)   Epic    Worried About Programme Researcher, Broadcasting/film/video in the Last Year: Never true    Ran Out of Food in the Last Year: Never true  Transportation Needs: No Transportation Needs (01/07/2024)   Epic    Lack of Transportation  (Medical): No    Lack of Transportation (Non-Medical): No  Physical Activity: Insufficiently Active (01/07/2024)   Exercise Vital Sign    Days of Exercise per Week: 2 days    Minutes of Exercise per Session: 10 min  Stress: Stress Concern Present (01/07/2024)   [employer] of Occupational Health - Occupational Stress Questionnaire    Feeling of Stress: Rather much  Social Connections: Moderately Integrated (01/07/2024)  Social Advertising Account Executive    Frequency of Communication with Friends and Family: More than three times a week    Frequency of Social Gatherings with Friends and Family: Twice a week    Attends Religious Services: 1 to 4 times per year    Active Member of [employer] or Organizations: Yes    Attends Banker Meetings: 1 to 4 times per year    Marital Status: Divorced  Intimate Partner Violence: Not At Risk (04/17/2023)   Humiliation, Afraid, Rape, and Kick questionnaire    Fear of Current or Ex-Partner: No    Emotionally Abused: No    Physically Abused: No    Sexually Abused: No  Depression (PHQ2-9): Low Risk (07/12/2023)   Depression (PHQ2-9)    PHQ-2 Score: 0  Alcohol  Screen: Low Risk (04/17/2023)   Alcohol  Screen    Last Alcohol  Screening Score (AUDIT): 0  Housing: Low Risk (01/07/2024)   Epic    Unable to Pay for Housing in the Last Year: No    Number of Times Moved in the Last Year: 0    Homeless in the Last Year: No  Utilities: Not At Risk (04/17/2023)   [employer] Utilities    Threatened with loss of utilities: No  Health Literacy: Adequate Health Literacy (04/17/2023)   B1300 Health Literacy    Frequency of need for help with medical instructions: Never    No family history on file.   Review of Systems  Constitutional: Negative.  Negative for chills and fever.  HENT: Negative.  Negative for congestion and sore throat.   Respiratory: Negative.  Negative for cough and shortness of breath.   Cardiovascular: Negative.  Negative for  chest pain and palpitations.  Gastrointestinal:  Negative for abdominal pain, diarrhea, nausea and vomiting.  Genitourinary: Negative.  Negative for dysuria and hematuria.  Musculoskeletal:  Positive for joint pain (Chronic left knee pain).  Skin: Negative.  Negative for rash.  Neurological: Negative.  Negative for dizziness and headaches.  All other systems reviewed and are negative.   Vitals:   01/11/24 1033 01/11/24 1041  BP: (!) 160/80 (!) 160/80  Pulse: (!) 111   Temp: 98.1 F (36.7 C)   SpO2: 94%     Physical Exam Vitals reviewed.  Constitutional:      Appearance: Normal appearance.  HENT:     Head: Normocephalic.     Mouth/Throat:     Mouth: Mucous membranes are moist.     Pharynx: Oropharynx is clear.  Eyes:     Extraocular Movements: Extraocular movements intact.     Conjunctiva/sclera: Conjunctivae normal.     Pupils: Pupils are equal, round, and reactive to light.  Cardiovascular:     Rate and Rhythm: Normal rate and regular rhythm.     Pulses: Normal pulses.     Heart sounds: Normal heart sounds.  Pulmonary:     Effort: Pulmonary effort is normal.     Breath sounds: Normal breath sounds.  Musculoskeletal:     Cervical back: No tenderness.  Lymphadenopathy:     Cervical: No cervical adenopathy.  Skin:    General: Skin is warm and dry.  Neurological:     General: No focal deficit present.     Mental Status: She is alert and oriented to person, place, and time.  Psychiatric:        Mood and Affect: Mood normal.        Behavior: Behavior normal.      ASSESSMENT & PLAN:  A total of 44 minutes was spent with the patient and counseling/coordination of care regarding preparing for this visit, review of most recent office visit notes, review of multiple chronic medical conditions and their management, review of all medications, review of most recent bloodwork results, review of health maintenance items, education on nutrition, prognosis, documentation, and  need for follow up.   Problem List Items Addressed This Visit       Cardiovascular and Mediastinum   Essential hypertension - Primary   BP Readings from Last 3 Encounters:  01/11/24 (!) 160/80  08/23/23 (!) 168/72  07/12/23 138/78  Evaded blood pressure reading in the office but normal at home Continue amlodipine  5 mg daily and valsartan  HCT 320-12.5 mg daily Cardiovascular risks associated with hypertension discussed Diet and nutrition discussed         Respiratory   Asthma, chronic (Chronic)   Stable overall, cont inhaler prn       OSA on CPAP   Stable on CPAP treatment         Genitourinary   Stage 3b chronic kidney disease (HCC)   Advised to stay well-hydrated and avoid NSAIDs        Other   Severe obesity (BMI >= 40) (HCC) (Chronic)   Diet and nutrition discussed. Advised to decrease amount of daily carbohydrate intake and daily calories and increase amount of plant-based protein in her diet      History of left knee replacement   With chronic left knee pain Pain management discussed Advised to take Tylenol  and avoid NSAIDs due to chronic kidney condition      History of multiple myeloma   Recent metastatic bone survey does not show lytic lesions.  Stable chronic condition Sees oncologist on a regular basis      Smoldering multiple myeloma   Stable without complications. No recent infections. Follows up with oncologist on a regular basis Last office visit notes reviewed      Situational anxiety   Known triggers Mental health management discussed      Patient Instructions  Health Maintenance After Age 44 After age 31, you are at a higher risk for certain long-term diseases and infections as well as injuries from falls. Falls are a major cause of broken bones and head injuries in people who are older than age 60. Getting regular preventive care can help to keep you healthy and well. Preventive care includes getting regular testing and making  lifestyle changes as recommended by your health care provider. Talk with your health care provider about: Which screenings and tests you should have. A screening is a test that checks for a disease when you have no symptoms. A diet and exercise plan that is right for you. What should I know about screenings and tests to prevent falls? Screening and testing are the best ways to find a health problem early. Early diagnosis and treatment give you the best chance of managing medical conditions that are common after age 53. Certain conditions and lifestyle choices may make you more likely to have a fall. Your health care provider may recommend: Regular vision checks. Poor vision and conditions such as cataracts can make you more likely to have a fall. If you wear glasses, make sure to get your prescription updated if your vision changes. Medicine review. Work with your health care provider to regularly review all of the medicines you are taking, including over-the-counter medicines. Ask your health care provider about any side effects that may make you  more likely to have a fall. Tell your health care provider if any medicines that you take make you feel dizzy or sleepy. Strength and balance checks. Your health care provider may recommend certain tests to check your strength and balance while standing, walking, or changing positions. Foot health exam. Foot pain and numbness, as well as not wearing proper footwear, can make you more likely to have a fall. Screenings, including: Osteoporosis screening. Osteoporosis is a condition that causes the bones to get weaker and break more easily. Blood pressure screening. Blood pressure changes and medicines to control blood pressure can make you feel dizzy. Depression screening. You may be more likely to have a fall if you have a fear of falling, feel depressed, or feel unable to do activities that you used to do. Alcohol  use screening. Using too much alcohol  can  affect your balance and may make you more likely to have a fall. Follow these instructions at home: Lifestyle Do not drink alcohol  if: Your health care provider tells you not to drink. If you drink alcohol : Limit how much you have to: 0-1 drink a day for women. 0-2 drinks a day for men. Know how much alcohol  is in your drink. In the [place] one drink equals one 12 oz bottle of beer (355 mL), one 5 oz glass of wine (148 mL), or one 1 oz glass of hard liquor (44 mL). Do not use any products that contain nicotine or tobacco. These products include cigarettes, chewing tobacco, and vaping devices, such as e-cigarettes. If you need help quitting, ask your health care provider. Activity  Follow a regular exercise program to stay fit. This will help you maintain your balance. Ask your health care provider what types of exercise are appropriate for you. If you need a cane or walker, use it as recommended by your health care provider. Wear supportive shoes that have nonskid soles. Safety  Remove any tripping hazards, such as rugs, cords, and clutter. Install safety equipment such as grab bars in bathrooms and safety rails on stairs. Keep rooms and walkways well-lit. General instructions Talk with your health care provider about your risks for falling. Tell your health care provider if: You fall. Be sure to tell your health care provider about all falls, even ones that seem minor. You feel dizzy, tiredness (fatigue), or off-balance. Take over-the-counter and prescription medicines only as told by your health care provider. These include supplements. Eat a healthy diet and maintain a healthy weight. A healthy diet includes low-fat dairy products, low-fat (lean) meats, and fiber from whole grains, beans, and lots of fruits and vegetables. Stay current with your vaccines. Schedule regular health, dental, and eye exams. Summary Having a healthy lifestyle and getting preventive care can help to protect  your health and wellness after age 11. Screening and testing are the best way to find a health problem early and help you avoid having a fall. Early diagnosis and treatment give you the best chance for managing medical conditions that are more common for people who are older than age 4. Falls are a major cause of broken bones and head injuries in people who are older than age 15. Take precautions to prevent a fall at home. Work with your health care provider to learn what changes you can make to improve your health and wellness and to prevent falls. This information is not intended to replace advice given to you by your health care provider. Make sure you discuss any questions you have  with your health care provider. Document Revised: 06/01/2020 Document Reviewed: 06/01/2020 Elsevier Patient Education  2024 Elsevier Inc.     Diane Schaumann, Diane Daugherty [facility]    [1]  Allergies Allergen Reactions   Levaquin [Levofloxacin] Swelling   Morphine  And Codeine Nausea Only   Other Nausea And Vomiting    Darvocet-vomiting   Oxycodone Nausea Only   Tramadol  Other (See Comments)    And dizziness   Naprosyn [Naproxen] Itching and Rash

## 2024-01-11 NOTE — Assessment & Plan Note (Signed)
 With chronic left knee pain Pain management discussed Advised to take Tylenol  and avoid NSAIDs due to chronic kidney condition

## 2024-01-11 NOTE — Assessment & Plan Note (Signed)
 Known triggers Mental health management discussed

## 2024-01-11 NOTE — Assessment & Plan Note (Signed)
 Stable on CPAP treatment.

## 2024-01-11 NOTE — Assessment & Plan Note (Signed)
Stable overall, cont inhaler prn 

## 2024-01-11 NOTE — Assessment & Plan Note (Signed)
Advised to stay well-hydrated and avoid NSAIDs. ?

## 2024-01-11 NOTE — Patient Instructions (Signed)
 Health Maintenance After Age 81 After age 27, you are at a higher risk for certain long-term diseases and infections as well as injuries from falls. Falls are a major cause of broken bones and head injuries in people who are older than age 73. Getting regular preventive care can help to keep you healthy and well. Preventive care includes getting regular testing and making lifestyle changes as recommended by your health care provider. Talk with your health care provider about: Which screenings and tests you should have. A screening is a test that checks for a disease when you have no symptoms. A diet and exercise plan that is right for you. What should I know about screenings and tests to prevent falls? Screening and testing are the best ways to find a health problem early. Early diagnosis and treatment give you the best chance of managing medical conditions that are common after age 90. Certain conditions and lifestyle choices may make you more likely to have a fall. Your health care provider may recommend: Regular vision checks. Poor vision and conditions such as cataracts can make you more likely to have a fall. If you wear glasses, make sure to get your prescription updated if your vision changes. Medicine review. Work with your health care provider to regularly review all of the medicines you are taking, including over-the-counter medicines. Ask your health care provider about any side effects that may make you more likely to have a fall. Tell your health care provider if any medicines that you take make you feel dizzy or sleepy. Strength and balance checks. Your health care provider may recommend certain tests to check your strength and balance while standing, walking, or changing positions. Foot health exam. Foot pain and numbness, as well as not wearing proper footwear, can make you more likely to have a fall. Screenings, including: Osteoporosis screening. Osteoporosis is a condition that causes  the bones to get weaker and break more easily. Blood pressure screening. Blood pressure changes and medicines to control blood pressure can make you feel dizzy. Depression screening. You may be more likely to have a fall if you have a fear of falling, feel depressed, or feel unable to do activities that you used to do. Alcohol  use screening. Using too much alcohol  can affect your balance and may make you more likely to have a fall. Follow these instructions at home: Lifestyle Do not drink alcohol  if: Your health care provider tells you not to drink. If you drink alcohol : Limit how much you have to: 0-1 drink a day for women. 0-2 drinks a day for men. Know how much alcohol  is in your drink. In the [place] one drink equals one 12 oz bottle of beer (355 mL), one 5 oz glass of wine (148 mL), or one 1 oz glass of hard liquor (44 mL). Do not use any products that contain nicotine or tobacco. These products include cigarettes, chewing tobacco, and vaping devices, such as e-cigarettes. If you need help quitting, ask your health care provider. Activity  Follow a regular exercise program to stay fit. This will help you maintain your balance. Ask your health care provider what types of exercise are appropriate for you. If you need a cane or walker, use it as recommended by your health care provider. Wear supportive shoes that have nonskid soles. Safety  Remove any tripping hazards, such as rugs, cords, and clutter. Install safety equipment such as grab bars in bathrooms and safety rails on stairs. Keep rooms and walkways  well-lit. General instructions Talk with your health care provider about your risks for falling. Tell your health care provider if: You fall. Be sure to tell your health care provider about all falls, even ones that seem minor. You feel dizzy, tiredness (fatigue), or off-balance. Take over-the-counter and prescription medicines only as told by your health care provider. These include  supplements. Eat a healthy diet and maintain a healthy weight. A healthy diet includes low-fat dairy products, low-fat (lean) meats, and fiber from whole grains, beans, and lots of fruits and vegetables. Stay current with your vaccines. Schedule regular health, dental, and eye exams. Summary Having a healthy lifestyle and getting preventive care can help to protect your health and wellness after age 15. Screening and testing are the best way to find a health problem early and help you avoid having a fall. Early diagnosis and treatment give you the best chance for managing medical conditions that are more common for people who are older than age 42. Falls are a major cause of broken bones and head injuries in people who are older than age 64. Take precautions to prevent a fall at home. Work with your health care provider to learn what changes you can make to improve your health and wellness and to prevent falls. This information is not intended to replace advice given to you by your health care provider. Make sure you discuss any questions you have with your health care provider. Document Revised: 06/01/2020 Document Reviewed: 06/01/2020 Elsevier Patient Education  2024 [employer].

## 2024-01-11 NOTE — Assessment & Plan Note (Signed)
Stable without complications. No recent infections. Follows up with oncologist on a regular basis Last office visit notes reviewed

## 2024-01-11 NOTE — Assessment & Plan Note (Signed)
 BP Readings from Last 3 Encounters:  01/11/24 (!) 160/80  08/23/23 (!) 168/72  07/12/23 138/78  Evaded blood pressure reading in the office but normal at home Continue amlodipine  5 mg daily and valsartan  HCT 320-12.5 mg daily Cardiovascular risks associated with hypertension discussed Diet and nutrition discussed

## 2024-01-15 ENCOUNTER — Other Ambulatory Visit: Payer: Self-pay

## 2024-01-15 DIAGNOSIS — I6523 Occlusion and stenosis of bilateral carotid arteries: Secondary | ICD-10-CM

## 2024-01-18 IMAGING — MR MR KNEE*L* WO/W CM
12 series · 40 of 40 positions shown · IV contrast (gadavist)
Comparison: None available

CLINICAL DATA: Knee bone lesion. Incidental. History of smoldering
multiple myeloma. Recent area of interest -- Left tibia under the
prosthesis -- reportedly seen on bone survey. This is unavailable at
the time of dictation.

EXAM:
MRI OF THE LEFT KNEE WITHOUT AND WITH CONTRAST
TECHNIQUE: Multiplanar, multisequence MR imaging of the left knee was performed
both before and after administration of intravenous contrast.
CONTRAST:  9mL GADAVIST GADOBUTROL 1 MMOL/ML IV SOLN

[Series 6: STIR · axial · left · 4.0mm · 0.62mm/px · z∈[-103,+42]mm · 4 of 30 slices shown (1 of 2)]
[im 1/30]
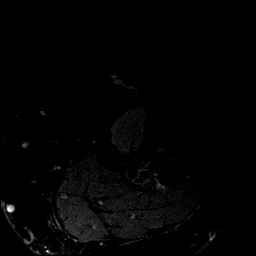
[im 10/30]
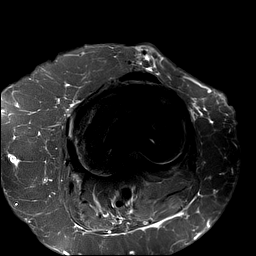
[im 20/30]
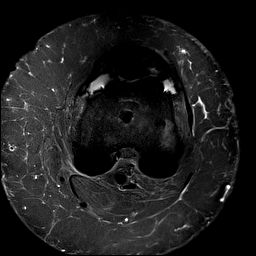
[im 30/30]
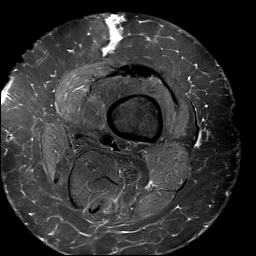

[Series 7: PD · axial · left · 4.0mm · 0.50mm/px · z∈[-122,+23]mm · 3 of 30 slices shown (1 of 3)]
[im 1/30]
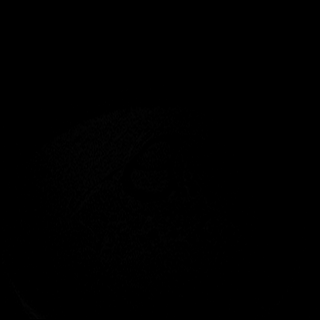
[im 15/30]
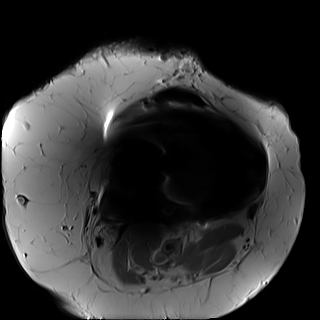
[im 30/30]
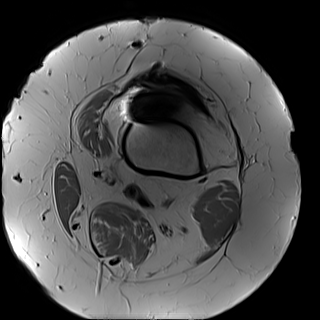

[Series 8: PD · coronal · left · 3.0mm · 0.47mm/px · 4 of 32 slices shown (2 of 3)]
[im 1/32]
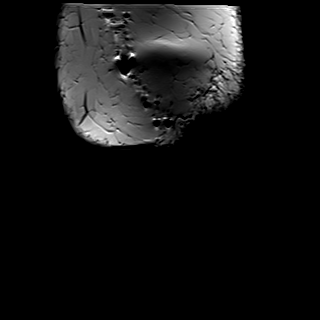
[im 11/32]
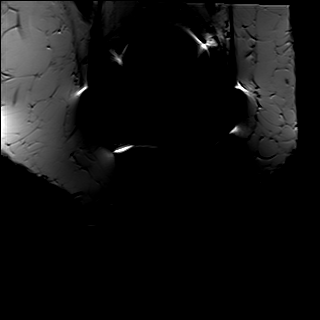
[im 21/32]
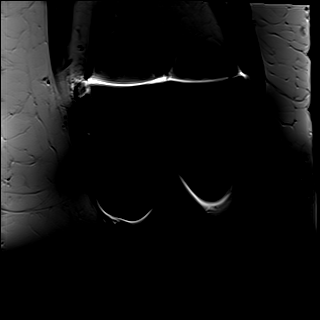
[im 32/32]
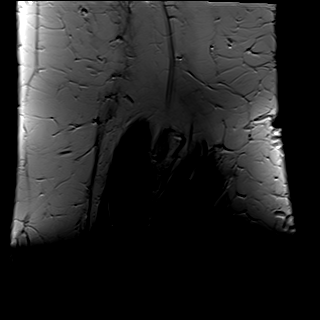

[Series 9: T1 · coronal · left · 3.0mm · 0.29mm/px · 4 of 32 slices shown (1 of 2)]
[im 1/32]
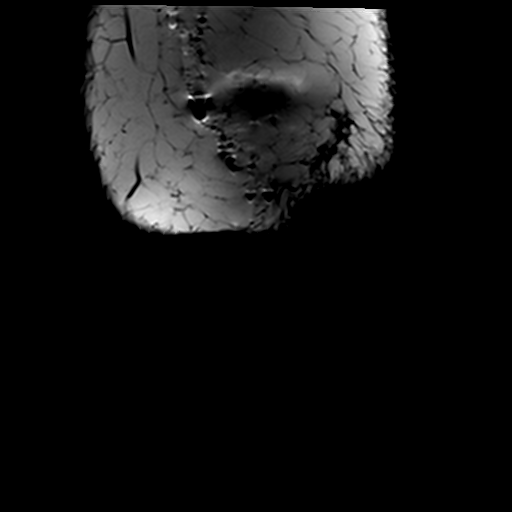
[im 11/32]
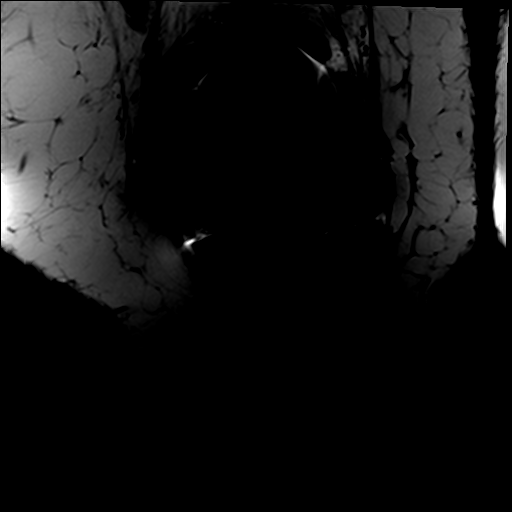
[im 21/32]
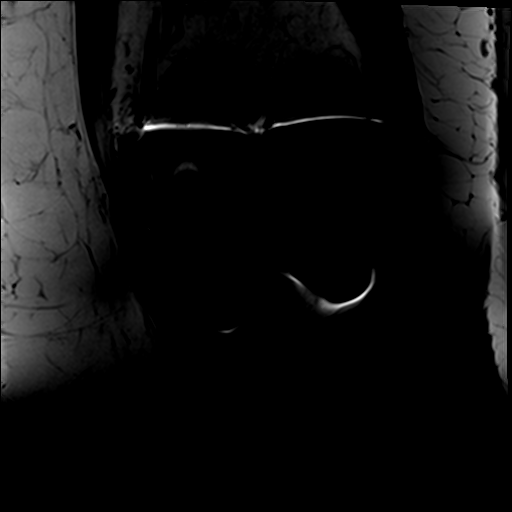
[im 32/32]
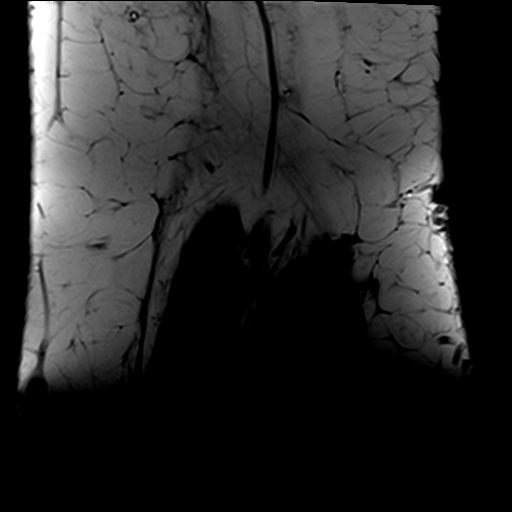

[Series 10: STIR · coronal · left · 3.0mm · 0.66mm/px · 3 of 29 slices shown (2 of 2)]
[im 1/29]
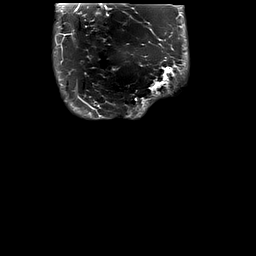
[im 15/29]
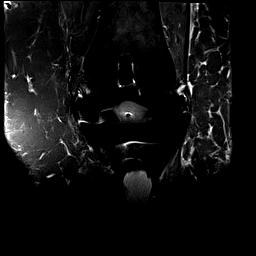
[im 29/29]
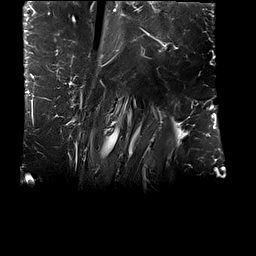

[Series 11: PD · sagittal · left · 4.0mm · 0.53mm/px · 2 of 22 slices shown (3 of 3)]
[im 1/22]
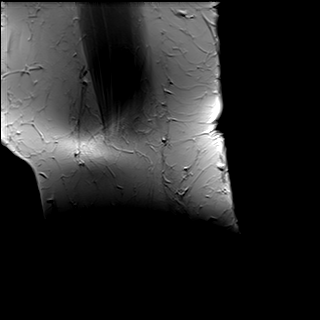
[im 22/22]
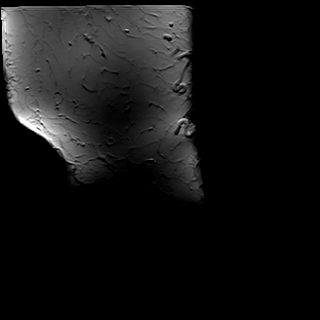

[Series 13: T1 · axial · non-contrast · left · 4.5mm · 0.63mm/px · z∈[-164,+33]mm · 4 of 36 slices shown (2 of 2)]
[im 1/36]
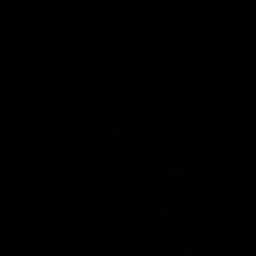
[im 12/36]
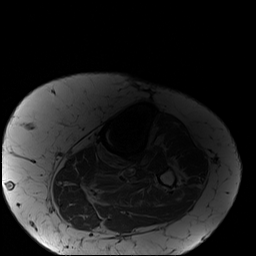
[im 24/36]
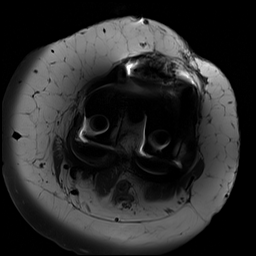
[im 36/36]
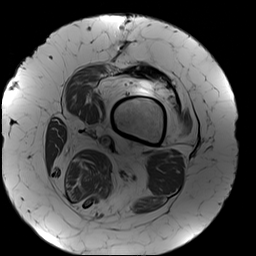

[Series 14: T1 post-contrast · axial · left · 4.5mm · 0.63mm/px · z∈[-164,+33]mm · 4 of 36 slices shown]
[im 1/36]
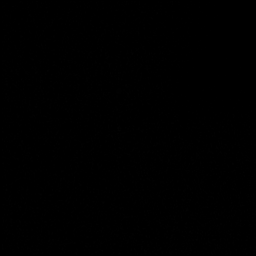
[im 12/36]
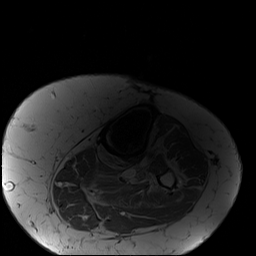
[im 24/36]
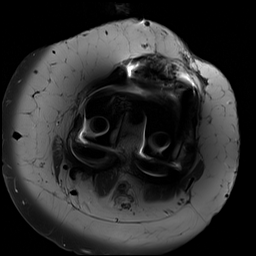
[im 36/36]
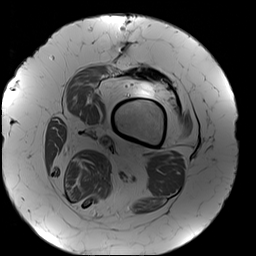

[Series 15: T1 fat-sat · coronal · left · 3.0mm · 0.78mm/px · 4 of 32 slices shown (1 of 3)]
[im 1/32]
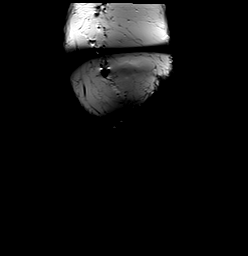
[im 11/32]
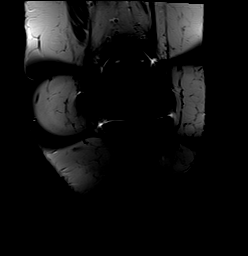
[im 21/32]
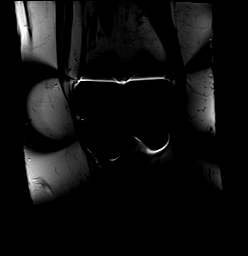
[im 32/32]
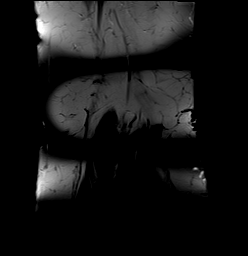

[Series 16: T1 fat-sat · sagittal · left · 3.0mm · 0.78mm/px · 2 of 16 slices shown (2 of 3)]
[im 1/16]
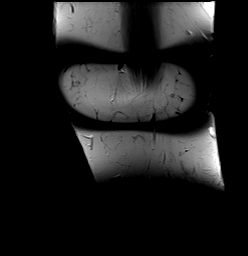
[im 16/16]
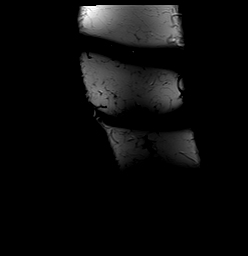

[Series 17: T1 fat-sat post-contrast · axial · left · 4.5mm · 0.63mm/px · z∈[-147,+16]mm · 3 of 30 slices shown]
[im 1/30]
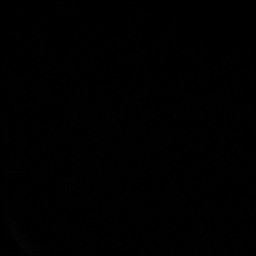
[im 15/30]
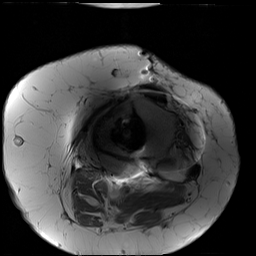
[im 30/30]
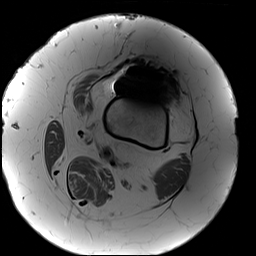

[Series 18: T1 fat-sat · coronal · left · 3.0mm · 0.78mm/px · 3 of 27 slices shown (3 of 3)]
[im 1/27]
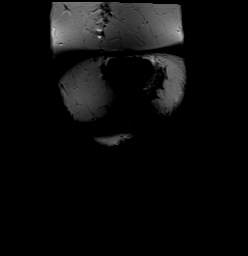
[im 14/27]
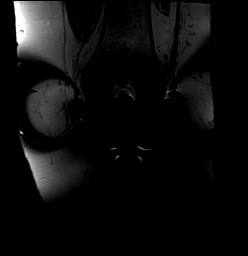
[im 27/27]
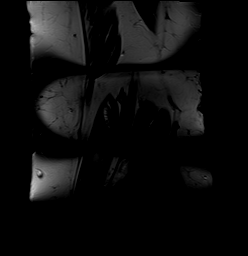

[40 of 40 positions shown; findings below may reference images not displayed]

FINDINGS: Bones/Joint/Cartilage

There is extensive metallic susceptibility artifact from patient's
total left knee arthroplasty hardware. The following findings are
made within this limitation. Just distal to the tibial stem, the
majority of the medullary cavity is filled with a decreased T1 and
increased STIR signal nonenhancing apparent fluid signal lesion
measuring up to 2.2 by 3.0 cm in greatest transverse and AP
dimensions. This measures at least 3.0 cm in craniocaudal dimension
but the inferior aspect is not well visualized due to loss of signal
to noise and due to artifact at the inferior portion of imaging. No
definite extraosseous soft tissue mass is seen. This cystic
collection is suspicious for osteolysis from hardware loosening.

Ligaments

The medial collateral ligament is grossly intact. The fibular
collateral ligament, biceps femoris tendon, iliotibial band, and
popliteus tendon are intact.

Muscles and Tendons

The quadriceps and patellar tendons are grossly intact.

Soft tissues

No popliteal cyst is seen.

There is scarring within the anterior knee subcutaneous fat from
prior surgical approach.
IMPRESSION: Metallic susceptibility artifact from total right knee arthroplasty
hardware limits evaluation. Just distal to the tibia there appears
to be a nonenhancing cystic collection within the medullary cavity
of the proximal tibia. This is suspicious for osteolysis from
hardware loosening.

## 2024-01-30 ENCOUNTER — Other Ambulatory Visit: Payer: Self-pay | Admitting: Hematology and Oncology

## 2024-01-30 DIAGNOSIS — D472 Monoclonal gammopathy: Secondary | ICD-10-CM

## 2024-01-31 ENCOUNTER — Inpatient Hospital Stay: Payer: Medicare (Managed Care) | Attending: Physician Assistant

## 2024-01-31 DIAGNOSIS — D472 Monoclonal gammopathy: Secondary | ICD-10-CM

## 2024-01-31 LAB — LACTATE DEHYDROGENASE: LDH: 172 U/L (ref 105–235)

## 2024-01-31 LAB — CMP (CANCER CENTER ONLY)
ALT: 13 U/L (ref 0–44)
AST: 20 U/L (ref 15–41)
Albumin: 4.4 g/dL (ref 3.5–5.0)
Alkaline Phosphatase: 74 U/L (ref 38–126)
Anion gap: 10 (ref 5–15)
BUN: 18 mg/dL (ref 8–23)
CO2: 26 mmol/L (ref 22–32)
Calcium: 10.3 mg/dL (ref 8.9–10.3)
Chloride: 93 mmol/L — ABNORMAL LOW (ref 98–111)
Creatinine: 1.18 mg/dL — ABNORMAL HIGH (ref 0.44–1.00)
GFR, Estimated: 46 mL/min — ABNORMAL LOW
Glucose, Bld: 100 mg/dL — ABNORMAL HIGH (ref 70–99)
Potassium: 4.3 mmol/L (ref 3.5–5.1)
Sodium: 129 mmol/L — ABNORMAL LOW (ref 135–145)
Total Bilirubin: 0.5 mg/dL (ref 0.0–1.2)
Total Protein: 8.8 g/dL — ABNORMAL HIGH (ref 6.5–8.1)

## 2024-01-31 LAB — CBC WITH DIFFERENTIAL (CANCER CENTER ONLY)
Abs Immature Granulocytes: 0.03 K/uL (ref 0.00–0.07)
Basophils Absolute: 0 K/uL (ref 0.0–0.1)
Basophils Relative: 1 %
Eosinophils Absolute: 0.1 K/uL (ref 0.0–0.5)
Eosinophils Relative: 2 %
HCT: 32.8 % — ABNORMAL LOW (ref 36.0–46.0)
Hemoglobin: 11.8 g/dL — ABNORMAL LOW (ref 12.0–15.0)
Immature Granulocytes: 0 %
Lymphocytes Relative: 27 %
Lymphs Abs: 2.3 K/uL (ref 0.7–4.0)
MCH: 32.1 pg (ref 26.0–34.0)
MCHC: 36 g/dL (ref 30.0–36.0)
MCV: 89.1 fL (ref 80.0–100.0)
Monocytes Absolute: 0.5 K/uL (ref 0.1–1.0)
Monocytes Relative: 5 %
Neutro Abs: 5.4 K/uL (ref 1.7–7.7)
Neutrophils Relative %: 65 %
Platelet Count: 264 K/uL (ref 150–400)
RBC: 3.68 MIL/uL — ABNORMAL LOW (ref 3.87–5.11)
RDW: 13.7 % (ref 11.5–15.5)
WBC Count: 8.3 K/uL (ref 4.0–10.5)
nRBC: 0 % (ref 0.0–0.2)

## 2024-02-01 LAB — KAPPA/LAMBDA LIGHT CHAINS
Kappa free light chain: 65.1 mg/L — ABNORMAL HIGH (ref 3.3–19.4)
Kappa, lambda light chain ratio: 10.02 — ABNORMAL HIGH (ref 0.26–1.65)
Lambda free light chains: 6.5 mg/L (ref 5.7–26.3)

## 2024-02-03 LAB — MULTIPLE MYELOMA PANEL, SERUM
Albumin SerPl Elph-Mcnc: 3.7 g/dL (ref 2.9–4.4)
Albumin/Glob SerPl: 0.9 (ref 0.7–1.7)
Alpha 1: 0.3 g/dL (ref 0.0–0.4)
Alpha2 Glob SerPl Elph-Mcnc: 0.9 g/dL (ref 0.4–1.0)
B-Globulin SerPl Elph-Mcnc: 1 g/dL (ref 0.7–1.3)
Gamma Glob SerPl Elph-Mcnc: 2.1 g/dL — ABNORMAL HIGH (ref 0.4–1.8)
Globulin, Total: 4.4 g/dL — ABNORMAL HIGH (ref 2.2–3.9)
IgA: 33 mg/dL — ABNORMAL LOW (ref 64–422)
IgG (Immunoglobin G), Serum: 2481 mg/dL — ABNORMAL HIGH (ref 586–1602)
IgM (Immunoglobulin M), Srm: 15 mg/dL — ABNORMAL LOW (ref 26–217)
M Protein SerPl Elph-Mcnc: 1.8 g/dL — ABNORMAL HIGH
Total Protein ELP: 8.1 g/dL (ref 6.0–8.5)

## 2024-02-07 ENCOUNTER — Inpatient Hospital Stay: Payer: Medicare (Managed Care) | Admitting: Physician Assistant

## 2024-02-07 ENCOUNTER — Ambulatory Visit: Admitting: Physician Assistant

## 2024-02-07 VITALS — BP 146/57 | HR 99 | Temp 97.0°F | Resp 16 | Ht 61.0 in | Wt 216.7 lb

## 2024-02-07 DIAGNOSIS — D472 Monoclonal gammopathy: Secondary | ICD-10-CM

## 2024-02-07 NOTE — Progress Notes (Signed)
 " [facility] Telephone:([phone]) [phone]   Fax:([phone]) [phone]  PROGRESS NOTE  Patient Care Team: Purcell Emil Schanz, MD as PCP - General (Internal Medicine) Federico Norleen ONEIDA MADISON, MD as Consulting Physician (Hematology and Oncology) Leigh Venetia CROME, MD as Consulting Physician (Neurology) Gearline Norris, MD as Consulting Physician (Nephrology) Pearline Norman RAMAN, MD as Consulting Physician (Vascular Surgery)  Hematological/Oncological History # Smoldering Multiple Myeloma 07/20/2020: SPEP showed M spike 1.7 g/dl, UPEP showed no abnormalities 09/21/2020: establish care with Dr. Federico. Kappa 77.3, Lambda 6.6, ratio 11.71.  09/25/2020: met survey shows no clear lytic lesions 10/01/2020: BmBx showed mildly hypercellular marrow involved by kappa-restricted plasma cell neoplasm (20%)  Interval History:  Diane Daugherty 84 y.o. female with medical history significant for smoldering multiple myeloma who presents for a follow up visit. The patient's last visit was on 08/23/2023. In the interim since the last visit she has had no major changes in her health.  On exam today Ms. Lomba reports she is doing well without any changes to her health. Her energy and appetite are overall stable. She reports staying cold. She is able to complete her baseline ADLs on her own. She does have intermittent episodes of bilateral leg pain. She denies nausea, vomiting or bowel habit changes. She denies easy bruising or overt signs of bleeding. She denies fevers, chills, sweats, shortness of breath, chest pain or cough.   MEDICAL HISTORY:  Past Medical History:  Diagnosis Date   Anemia 09/24/1968   after childbirth   Arthritis    feels like all over   Carpal tunnel syndrome    Carpal tunnel syndrome    left   Cataract    immature on right   Chronic bronchitis (HCC)    get it most years (10/08/2014)   Chronic kidney disease    stage III, per Pt   Family history of anesthesia complication    daughter  gets sick   History of blood transfusion    w/one of my back OR's   Hypertension    takes Hyzaar daily   Joint pain    all over   Joint swelling    Muscle spasms of lower extremity    takes Baclofen  daily prn   Nocturia    OSA on CPAP    Peripheral vascular disease    PFO (patent foramen ovale)    positive saline bubble study 05/12/09 ([facility])   Pneumonia 2010/ 2011   PONV (postoperative nausea and vomiting)    woke up during hysterectomy   Shortness of breath    with exertion;Albuterol  prn   Urinary frequency     SURGICAL HISTORY: Past Surgical History:  Procedure Laterality Date   ABDOMINAL HYSTERECTOMY  1970's?   APPENDECTOMY     BACK SURGERY     BREAST BIOPSY Right 1990's   CESAREAN SECTION  1962; 1963; 1970   CHEST TUBE INSERTION  80's   d/t pneumothorax   COLONOSCOPY     FRACTURE SURGERY     HARDWARE REMOVAL Right 1990's   took screw out of my lower leg ~ 1 yr after putting it in   HEMILAMINOTOMY LUMBAR SPINE  02/2002   L5; decompression of the L5 and S1 nerve root; synovial cyst excision/notes 06/09/2010   KNEE ARTHROSCOPY Right ?2013   LUMBAR LAMINECTOMY/DECOMPRESSION MICRODISCECTOMY  02/2010   POSTERIOR LUMBAR FUSION  05/2003   L4-5 and S-1    TIBIA FRACTURE SURGERY Right 1990's   put screw in   TOTAL KNEE  ARTHROPLASTY Right 08/28/2012   Procedure: TOTAL KNEE ARTHROPLASTY;  Surgeon: Maude LELON Right, MD;  Location: [facility] OR;  Service: Orthopedics;  Laterality: Right;   TOTAL KNEE ARTHROPLASTY Left 10/07/2014   TOTAL KNEE ARTHROPLASTY Left 10/07/2014   Procedure: TOTAL KNEE ARTHROPLASTY;  Surgeon: Maude LELON Right, MD;  Location: [facility] OR;  Service: Orthopedics;  Laterality: Left;   TOTAL KNEE REVISION Left 10/08/2021   Procedure: LEFT TOTAL KNEE REVISION;  Surgeon: Vernetta Lonni GRADE, MD;  Location: [facility] ORS;  Service: Orthopedics;  Laterality: Left;    SOCIAL HISTORY: Social History   Socioeconomic History   Marital status: Divorced    Spouse name:  Not on file   Number of children: Not on file   Years of education: Not on file   Highest education level: 12th grade  Occupational History   Not on file  Tobacco Use   Smoking status: Never    Passive exposure: Never   Smokeless tobacco: Never  Vaping Use   Vaping status: Never Used  Substance and Sexual Activity   Alcohol  use: No   Drug use: No   Sexual activity: Not Currently  Other Topics Concern   Not on file  Social History Narrative   Are you right handed or left handed? Right   Are you currently employed ?    What is your current occupation? Retired/disability   Do you live at home alone?   Who lives with you? daughter   What type of home do you live in: 1 story or 2 story? two   Caffeine I in am    Social Drivers of Health   Tobacco Use: Low Risk (01/11/2024)   Patient History    Smoking Tobacco Use: Never    Smokeless Tobacco Use: Never    Passive Exposure: Never  Financial Resource Strain: Low Risk (01/07/2024)   Overall Financial Resource Strain (CARDIA)    Difficulty of Paying Living Expenses: Not hard at all  Food Insecurity: No Food Insecurity (01/07/2024)   Epic    Worried About Programme Researcher, Broadcasting/film/video in the Last Year: Never true    Ran Out of Food in the Last Year: Never true  Transportation Needs: No Transportation Needs (01/07/2024)   Epic    Lack of Transportation (Medical): No    Lack of Transportation (Non-Medical): No  Physical Activity: Insufficiently Active (01/07/2024)   Exercise Vital Sign    Days of Exercise per Week: 2 days    Minutes of Exercise per Session: 10 min  Stress: Stress Concern Present (01/07/2024)   [employer] of Occupational Health - Occupational Stress Questionnaire    Feeling of Stress: Rather much  Social Connections: Moderately Integrated (01/07/2024)   Social Connection and Isolation Panel    Frequency of Communication with Friends and Family: More than three times a week    Frequency of Social Gatherings with  Friends and Family: Twice a week    Attends Religious Services: 1 to 4 times per year    Active Member of [employer] or Organizations: Yes    Attends Banker Meetings: 1 to 4 times per year    Marital Status: Divorced  Intimate Partner Violence: Not At Risk (04/17/2023)   Humiliation, Afraid, Rape, and Kick questionnaire    Fear of Current or Ex-Partner: No    Emotionally Abused: No    Physically Abused: No    Sexually Abused: No  Depression (PHQ2-9): Low Risk (07/12/2023)   Depression (PHQ2-9)    PHQ-2 Score:  0  Alcohol  Screen: Low Risk (04/17/2023)   Alcohol  Screen    Last Alcohol  Screening Score (AUDIT): 0  Housing: Low Risk (01/07/2024)   Epic    Unable to Pay for Housing in the Last Year: No    Number of Times Moved in the Last Year: 0    Homeless in the Last Year: No  Utilities: Not At Risk (04/17/2023)   [employer] Utilities    Threatened with loss of utilities: No  Health Literacy: Adequate Health Literacy (04/17/2023)   B1300 Health Literacy    Frequency of need for help with medical instructions: Never    FAMILY HISTORY: No family history on file.  ALLERGIES:  is allergic to levaquin [levofloxacin], morphine  and codeine, other, oxycodone, tramadol , and naprosyn [naproxen].  MEDICATIONS:  Current Outpatient Medications  Medication Sig Dispense Refill   acetaminophen  (TYLENOL ) 500 MG tablet Take 500 mg by mouth every 6 (six) hours as needed.     amLODipine  (NORVASC ) 5 MG tablet TAKE 1 TABLET (5 MG TOTAL) BY MOUTH DAILY. 90 tablet 3   baclofen  (LIORESAL ) 10 MG tablet Take 1 tablet (10 mg total) by mouth 3 (three) times daily as needed for muscle spasms. 20 tablet 0   Calcium Citrate-Vitamin D (CALCIUM CITRATE + D PO) Take 1 tablet by mouth daily.     CHELATED IRON PO Take 27 mg by mouth daily.     fluticasone  (FLONASE ) 50 MCG/ACT nasal spray Place 2 sprays into both nostrils daily as needed for allergies or rhinitis. 16 g 1   Krill Oil 300 MG CAPS Take 300 mg by mouth  daily.     Multiple Vitamins-Minerals (CENTRUM SILVER 50+WOMEN PO) Take 1 tablet by mouth daily.     valsartan -hydrochlorothiazide  (DIOVAN -HCT) 320-12.5 MG tablet TAKE 1 TABLET BY MOUTH EVERY DAY 90 tablet 3   No current facility-administered medications for this visit.    REVIEW OF SYSTEMS:   Constitutional: ( - ) fevers, ( - )  chills , ( - ) night sweats Eyes: ( - ) blurriness of vision, ( - ) double vision, ( - ) watery eyes Ears, nose, mouth, throat, and face: ( - ) mucositis, ( - ) sore throat Respiratory: ( - ) cough, ( - ) dyspnea, ( - ) wheezes Cardiovascular: ( - ) palpitation, ( - ) chest discomfort, ( - ) lower extremity swelling Gastrointestinal:  ( - ) nausea, ( - ) heartburn, ( - ) change in bowel habits Skin: ( - ) abnormal skin rashes Lymphatics: ( - ) new lymphadenopathy, ( - ) easy bruising Neurological: ( - ) numbness, ( - ) tingling, ( - ) new weaknesses Behavioral/Psych: ( - ) mood change, ( - ) new changes  All other systems were reviewed with the patient and are negative.  PHYSICAL EXAMINATION:  Vitals:   02/07/24 1132  BP: (!) 146/57  Pulse: 99  Resp: 16  Temp: (!) 97 F (36.1 C)  SpO2: 100%      Filed Weights   02/07/24 1132  Weight: 216 lb 11.2 oz (98.3 kg)       GENERAL: Well-appearing elderly African-American female, alert, no distress and comfortable SKIN: skin color, texture, turgor are normal, no rashes or significant lesions EYES: conjunctiva are pink and non-injected, sclera clear LUNGS: clear to auscultation and percussion with normal breathing effort HEART: regular rate & rhythm and no murmurs and no lower extremity edema Musculoskeletal: no cyanosis of digits and no clubbing  PSYCH: alert & oriented x  3, fluent speech NEURO: no focal motor/sensory deficits  LABORATORY DATA:  I have reviewed the data as listed    Latest Ref Rng & Units 01/31/2024   10:47 AM 08/15/2023   10:44 AM 02/16/2023    1:19 PM  CBC  WBC 4.0 - 10.5 K/uL  8.3  6.7  7.8   Hemoglobin 12.0 - 15.0 g/dL 88.1  88.8  88.1   Hematocrit 36.0 - 46.0 % 32.8  32.8  34.4   Platelets 150 - 400 K/uL 264  190  205        Latest Ref Rng & Units 01/31/2024   10:47 AM 08/15/2023   10:44 AM 02/16/2023    1:19 PM  CMP  Glucose 70 - 99 mg/dL 899  895  884   BUN 8 - 23 mg/dL 18  26  31    Creatinine 0.44 - 1.00 mg/dL 8.81  8.67  8.63   Sodium 135 - 145 mmol/L 129  139  134   Potassium 3.5 - 5.1 mmol/L 4.3  4.9  4.4   Chloride 98 - 111 mmol/L 93  104  99   CO2 22 - 32 mmol/L 26  31  29    Calcium 8.9 - 10.3 mg/dL 89.6  9.7  89.7   Total Protein 6.5 - 8.1 g/dL 8.8  7.9  8.1   Total Bilirubin 0.0 - 1.2 mg/dL 0.5  0.4  0.4   Alkaline Phos 38 - 126 U/L 74  61  58   AST 15 - 41 U/L 20  15  16    ALT 0 - 44 U/L 13  10  10      Lab Results  Component Value Date   MPROTEIN 1.8 (H) 01/31/2024   MPROTEIN 1.7 (H) 08/15/2023   MPROTEIN 1.5 (H) 02/16/2023   Lab Results  Component Value Date   KPAFRELGTCHN 65.1 (H) 01/31/2024   KPAFRELGTCHN 72.5 (H) 08/15/2023   KPAFRELGTCHN 76.8 (H) 02/16/2023   LAMBDASER 6.5 01/31/2024   LAMBDASER 5.9 08/15/2023   LAMBDASER 7.2 02/16/2023   KAPLAMBRATIO 10.02 (H) 01/31/2024   KAPLAMBRATIO 12.29 (H) 08/15/2023   KAPLAMBRATIO 4.29 02/19/2023     RADIOGRAPHIC STUDIES: No results found.   ASSESSMENT & PLAN Diane Daugherty is a 84 y.o. female with medical history significant for smoldering multiple myeloma who presents for a follow up visit.  #Smoldering Multiple Myeloma -- The patient currently meets one of the three 20-2-20 criteria (bmbx with 20% plasma cells). Two criteria are required for treatment.  --labs from 01/31/2024 reviewed with patient. WBC 8.3, Hgb 11.8, Plt 264, creatinine stable at 1.18, calcium levels are normal. SPEP showed M protein mildly increased to 1.8 (previously 1.7). Kappa light chains decreased to 65.1 (previously 72.5) -- No clear indication for treatment as long as the patient does not meet this  criteria or any of the CRAB criteria --Continue on observation. --Most recent bone scan from 02/18/2022 showed no definitive lytic bone lesion. Next scan scheduled for 02/14/2024.  --Recommend follow-up visit in 6 months time   #Hyponatremia: --Sodium level is 129.  --Advised to try electrolyte drinks especially as patient is c/o leg cramps.  No orders of the defined types were placed in this encounter.  All questions were answered. The patient knows to call the clinic with any problems, questions or concerns.  I have spent a total of 25 minutes minutes of face-to-face and non-face-to-face time, preparing to see the patient,performing a medically appropriate examination, counseling and educating the patient,documenting clinical  information in the electronic health record, and care coordination.   Johnston Police PA-C Dept of Hematology and Oncology [facility] Phone: [phone]     02/07/2024 11:56 AM "

## 2024-02-08 ENCOUNTER — Ambulatory Visit: Admitting: Emergency Medicine

## 2024-02-12 ENCOUNTER — Encounter: Payer: Self-pay | Admitting: Emergency Medicine

## 2024-02-12 ENCOUNTER — Ambulatory Visit: Payer: Medicare (Managed Care) | Admitting: Emergency Medicine

## 2024-02-12 VITALS — BP 140/60 | HR 95 | Temp 97.9°F | Ht 61.0 in | Wt 216.0 lb

## 2024-02-12 DIAGNOSIS — E871 Hypo-osmolality and hyponatremia: Secondary | ICD-10-CM | POA: Insufficient documentation

## 2024-02-12 DIAGNOSIS — C9000 Multiple myeloma not having achieved remission: Secondary | ICD-10-CM | POA: Insufficient documentation

## 2024-02-12 DIAGNOSIS — G4733 Obstructive sleep apnea (adult) (pediatric): Secondary | ICD-10-CM

## 2024-02-12 DIAGNOSIS — I10 Essential (primary) hypertension: Secondary | ICD-10-CM | POA: Diagnosis not present

## 2024-02-12 DIAGNOSIS — D472 Monoclonal gammopathy: Secondary | ICD-10-CM

## 2024-02-12 DIAGNOSIS — Z6841 Body Mass Index (BMI) 40.0 and over, adult: Secondary | ICD-10-CM

## 2024-02-12 DIAGNOSIS — N1832 Chronic kidney disease, stage 3b: Secondary | ICD-10-CM

## 2024-02-12 MED ORDER — VALSARTAN 320 MG PO TABS: 320.0000 mg | ORAL_TABLET | Freq: Every day | ORAL | 3 refills | Status: AC

## 2024-02-12 NOTE — Assessment & Plan Note (Signed)
 Stable on CPAP treatment.

## 2024-02-12 NOTE — Assessment & Plan Note (Signed)
 Diet and nutrition discussed.  Advised to decrease amount of daily carbohydrate intake and daily calories and increase amount of plant-based protein in her diet.

## 2024-02-12 NOTE — Assessment & Plan Note (Signed)
 Last sodium 129 Most likely related to diuretic use Recommend to stop hydrochlorothiazide 

## 2024-02-12 NOTE — Patient Instructions (Signed)
 Continue amlodipine  5 mg daily Stop valsartan  HCT Start plain valsartan  320 mg daily Monitor blood pressure readings at home daily for the next couple of weeks and contact the office if numbers persistently abnormal  Hypertension, Adult High blood pressure (hypertension) is when the force of blood pumping through the arteries is too strong. The arteries are the blood vessels that carry blood from the heart throughout the body. Hypertension forces the heart to work harder to pump blood and may cause arteries to become narrow or stiff. Untreated or uncontrolled hypertension can lead to a heart attack, heart failure, a stroke, kidney disease, and other problems. A blood pressure reading consists of a higher number over a lower number. Ideally, your blood pressure should be below 120/80. The first (top) number is called the systolic pressure. It is a measure of the pressure in your arteries as your heart beats. The second (bottom) number is called the diastolic pressure. It is a measure of the pressure in your arteries as the heart relaxes. What are the causes? The exact cause of this condition is not known. There are some conditions that result in high blood pressure. What increases the risk? Certain factors may make you more likely to develop high blood pressure. Some of these risk factors are under your control, including: Smoking. Not getting enough exercise or physical activity. Being overweight. Having too much fat, sugar, calories, or salt (sodium) in your diet. Drinking too much alcohol . Other risk factors include: Having a personal history of heart disease, diabetes, high cholesterol, or kidney disease. Stress. Having a family history of high blood pressure and high cholesterol. Having obstructive sleep apnea. Age. The risk increases with age. What are the signs or symptoms? High blood pressure may not cause symptoms. Very high blood pressure (hypertensive crisis) may  cause: Headache. Fast or irregular heartbeats (palpitations). Shortness of breath. Nosebleed. Nausea and vomiting. Vision changes. Severe chest pain, dizziness, and seizures. How is this diagnosed? This condition is diagnosed by measuring your blood pressure while you are seated, with your arm resting on a flat surface, your legs uncrossed, and your feet flat on the floor. The cuff of the blood pressure monitor will be placed directly against the skin of your upper arm at the level of your heart. Blood pressure should be measured at least twice using the same arm. Certain conditions can cause a difference in blood pressure between your right and left arms. If you have a high blood pressure reading during one visit or you have normal blood pressure with other risk factors, you may be asked to: Return on a different day to have your blood pressure checked again. Monitor your blood pressure at home for 1 week or longer. If you are diagnosed with hypertension, you may have other blood or imaging tests to help your health care provider understand your overall risk for other conditions. How is this treated? This condition is treated by making healthy lifestyle changes, such as eating healthy foods, exercising more, and reducing your alcohol  intake. You may be referred for counseling on a healthy diet and physical activity. Your health care provider may prescribe medicine if lifestyle changes are not enough to get your blood pressure under control and if: Your systolic blood pressure is above 130. Your diastolic blood pressure is above 80. Your personal target blood pressure may vary depending on your medical conditions, your age, and other factors. Follow these instructions at home: Eating and drinking  Eat a diet that is high  in fiber and potassium, and low in sodium, added sugar, and fat. An example of this eating plan is called the DASH diet. DASH stands for Dietary Approaches to Stop  Hypertension. To eat this way: Eat plenty of fresh fruits and vegetables. Try to fill one half of your plate at each meal with fruits and vegetables. Eat whole grains, such as whole-wheat pasta, brown rice, or whole-grain bread. Fill about one fourth of your plate with whole grains. Eat or drink low-fat dairy products, such as skim milk or low-fat yogurt. Avoid fatty cuts of meat, processed or cured meats, and poultry with skin. Fill about one fourth of your plate with lean proteins, such as fish, chicken without skin, beans, eggs, or tofu. Avoid pre-made and processed foods. These tend to be higher in sodium, added sugar, and fat. Reduce your daily sodium intake. Many people with hypertension should eat less than 1,500 mg of sodium a day. Do not drink alcohol  if: Your health care provider tells you not to drink. You are pregnant, may be pregnant, or are planning to become pregnant. If you drink alcohol : Limit how much you have to: 0-1 drink a day for women. 0-2 drinks a day for men. Know how much alcohol  is in your drink. In the [place] one drink equals one 12 oz bottle of beer (355 mL), one 5 oz glass of wine (148 mL), or one 1 oz glass of hard liquor (44 mL). Lifestyle  Work with your health care provider to maintain a healthy body weight or to lose weight. Ask what an ideal weight is for you. Get at least 30 minutes of exercise that causes your heart to beat faster (aerobic exercise) most days of the week. Activities may include walking, swimming, or biking. Include exercise to strengthen your muscles (resistance exercise), such as Pilates or lifting weights, as part of your weekly exercise routine. Try to do these types of exercises for 30 minutes at least 3 days a week. Do not use any products that contain nicotine or tobacco. These products include cigarettes, chewing tobacco, and vaping devices, such as e-cigarettes. If you need help quitting, ask your health care provider. Monitor your  blood pressure at home as told by your health care provider. Keep all follow-up visits. This is important. Medicines Take over-the-counter and prescription medicines only as told by your health care provider. Follow directions carefully. Blood pressure medicines must be taken as prescribed. Do not skip doses of blood pressure medicine. Doing this puts you at risk for problems and can make the medicine less effective. Ask your health care provider about side effects or reactions to medicines that you should watch for. Contact a health care provider if you: Think you are having a reaction to a medicine you are taking. Have headaches that keep coming back (recurring). Feel dizzy. Have swelling in your ankles. Have trouble with your vision. Get help right away if you: Develop a severe headache or confusion. Have unusual weakness or numbness. Feel faint. Have severe pain in your chest or abdomen. Vomit repeatedly. Have trouble breathing. These symptoms may be an emergency. Get help right away. Call 911. Do not wait to see if the symptoms will go away. Do not drive yourself to the hospital. Summary Hypertension is when the force of blood pumping through your arteries is too strong. If this condition is not controlled, it may put you at risk for serious complications. Your personal target blood pressure may vary depending on your medical conditions,  your age, and other factors. For most people, a normal blood pressure is less than 120/80. Hypertension is treated with lifestyle changes, medicines, or a combination of both. Lifestyle changes include losing weight, eating a healthy, low-sodium diet, exercising more, and limiting alcohol . This information is not intended to replace advice given to you by your health care provider. Make sure you discuss any questions you have with your health care provider. Document Revised: 11/17/2020 Document Reviewed: 11/17/2020 [employer] Patient Education  2024  [employer].

## 2024-02-12 NOTE — Progress Notes (Signed)
 Diane Daugherty 84 y.o.   Chief Complaint  Patient presents with   Follow-up    Patient is needing her Cpap supplies sent in.     HISTORY OF PRESENT ILLNESS: This is a 84 y.o. female here for follow-up of multiple chronic medical conditions including hypertension Recent blood work showed hyponatremia Needs referral to sleep apnea clinic  HPI   Prior to Admission medications  Medication Sig Start Date End Date Taking? Authorizing Provider  acetaminophen  (TYLENOL ) 500 MG tablet Take 500 mg by mouth every 6 (six) hours as needed.   Yes [provider]  amLODipine  (NORVASC ) 5 MG tablet TAKE 1 TABLET (5 MG TOTAL) BY MOUTH DAILY. 03/17/23 03/11/24 Yes Trystan Eads, Emil Schanz, MD  baclofen  (LIORESAL ) 10 MG tablet Take 1 tablet (10 mg total) by mouth 3 (three) times daily as needed for muscle spasms. 01/11/24  Yes Quantrell Splitt Jose, MD  Calcium Citrate-Vitamin D (CALCIUM CITRATE + D PO) Take 1 tablet by mouth daily.   Yes [provider]  CHELATED IRON PO Take 27 mg by mouth daily.   Yes [provider]  fluticasone  (FLONASE ) 50 MCG/ACT nasal spray Place 2 sprays into both nostrils daily as needed for allergies or rhinitis. 01/11/24  Yes Latish Toutant, Emil Schanz, MD  Krill Oil 300 MG CAPS Take 300 mg by mouth daily.   Yes [provider]  Multiple Vitamins-Minerals (CENTRUM SILVER 50+WOMEN PO) Take 1 tablet by mouth daily.   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    History of multiple myeloma 05/27/2021   Primary osteoarthritis of left knee 10/07/2014   History of left knee replacement 10/07/2014   Asthma, chronic 08/30/2012   Essential hypertension 08/30/2012   Osteoarthritis of right knee 08/30/2012   OSA on CPAP 08/30/2012   Severe obesity (BMI >= 40) (HCC) 08/30/2012    Past Medical History:  Diagnosis Date   Anemia 09/24/1968   after childbirth   Arthritis    feels like all over   Carpal tunnel syndrome    Carpal tunnel syndrome    left   Cataract    immature on right   Chronic bronchitis (HCC)    get it most years (10/08/2014)   Chronic kidney disease    stage III, per Pt   Family history of anesthesia complication    daughter gets sick   History of blood transfusion    w/one of my back OR's   Hypertension    takes Hyzaar daily   Joint pain    all over   Joint swelling    Muscle spasms of lower extremity    takes Baclofen  daily prn   Nocturia    OSA on CPAP    Peripheral vascular disease    PFO (patent foramen ovale)    positive saline bubble study 05/12/09 ([facility])   Pneumonia 2010/ 2011   PONV (postoperative nausea and vomiting)    woke up during hysterectomy   Shortness of breath    with exertion;Albuterol  prn   Urinary  frequency     Past Surgical History:  Procedure Laterality Date   ABDOMINAL HYSTERECTOMY  1970's?   APPENDECTOMY     BACK SURGERY     BREAST BIOPSY Right 1990's   CESAREAN SECTION  1962; 1963; 1970   CHEST TUBE INSERTION  80's   d/t pneumothorax   COLONOSCOPY     FRACTURE SURGERY     HARDWARE REMOVAL Right 1990's   took screw out of my lower leg ~ 1 yr after putting it in   HEMILAMINOTOMY LUMBAR SPINE  02/2002   L5; decompression of the L5 and S1 nerve root; synovial cyst excision/notes 06/09/2010   KNEE ARTHROSCOPY Right ?2013   LUMBAR LAMINECTOMY/DECOMPRESSION MICRODISCECTOMY  02/2010   POSTERIOR LUMBAR FUSION  05/2003   L4-5 and S-1    TIBIA FRACTURE SURGERY Right 1990's    put screw in   TOTAL KNEE ARTHROPLASTY Right 08/28/2012   Procedure: TOTAL KNEE ARTHROPLASTY;  Surgeon: Maude LELON Right, MD;  Location: [facility] OR;  Service: Orthopedics;  Laterality: Right;   TOTAL KNEE ARTHROPLASTY Left 10/07/2014   TOTAL KNEE ARTHROPLASTY Left 10/07/2014   Procedure: TOTAL KNEE ARTHROPLASTY;  Surgeon: Maude LELON Right, MD;  Location: [facility] OR;  Service: Orthopedics;  Laterality: Left;   TOTAL KNEE REVISION Left 10/08/2021   Procedure: LEFT TOTAL KNEE REVISION;  Surgeon: Vernetta Lonni GRADE, MD;  Location: [facility] ORS;  Service: Orthopedics;  Laterality: Left;    Social History   Socioeconomic History   Marital status: Divorced    Spouse name: Not on file   Number of children: Not on file   Years of education: Not on file   Highest education level: 12th grade  Occupational History   Not on file  Tobacco Use   Smoking status: Never    Passive exposure: Never   Smokeless tobacco: Never  Vaping Use   Vaping status: Never Used  Substance and Sexual Activity   Alcohol  use: No   Drug use: No   Sexual activity: Not Currently  Other Topics Concern   Not on file  Social History Narrative   Are you right handed or left handed? Right   Are you currently employed ?    What is your current occupation? Retired/disability   Do you live at home alone?   Who lives with you? daughter   What type of home do you live in: 1 story or 2 story? two   Caffeine I in am    Social Drivers of Health   Tobacco Use: Low Risk (02/12/2024)   Patient History    Smoking Tobacco Use: Never    Smokeless Tobacco Use: Never    Passive Exposure: Never  Financial Resource Strain: Low Risk (01/07/2024)   Overall Financial Resource Strain (CARDIA)    Difficulty of Paying Living Expenses: Not hard at all  Food Insecurity: No Food Insecurity (01/07/2024)   Epic    Worried About Programme Researcher, Broadcasting/film/video in the Last Year: Never true    Ran Out of Food in the Last Year: Never true  Transportation  Needs: No Transportation Needs (01/07/2024)   Epic    Lack of Transportation (Medical): No    Lack of Transportation (Non-Medical): No  Physical Activity: Insufficiently Active (01/07/2024)   Exercise Vital Sign    Days of Exercise per Week: 2 days    Minutes of Exercise per Session: 10 min  Stress: Stress Concern Present (01/07/2024)   [employer] of Occupational Health - Occupational Stress Questionnaire  Feeling of Stress: Rather much  Social Connections: Moderately Integrated (01/07/2024)   Social Connection and Isolation Panel    Frequency of Communication with Friends and Family: More than three times a week    Frequency of Social Gatherings with Friends and Family: Twice a week    Attends Religious Services: 1 to 4 times per year    Active Member of [employer] or Organizations: Yes    Attends Banker Meetings: 1 to 4 times per year    Marital Status: Divorced  Catering Manager Violence: Not At Risk (04/17/2023)   Humiliation, Afraid, Rape, and Kick questionnaire    Fear of Current or Ex-Partner: No    Emotionally Abused: No    Physically Abused: No    Sexually Abused: No  Depression (PHQ2-9): Low Risk (07/12/2023)   Depression (PHQ2-9)    PHQ-2 Score: 0  Alcohol  Screen: Low Risk (04/17/2023)   Alcohol  Screen    Last Alcohol  Screening Score (AUDIT): 0  Housing: Low Risk (01/07/2024)   Epic    Unable to Pay for Housing in the Last Year: No    Number of Times Moved in the Last Year: 0    Homeless in the Last Year: No  Utilities: Not At Risk (04/17/2023)   AHC Utilities    Threatened with loss of utilities: No  Health Literacy: Adequate Health Literacy (04/17/2023)   B1300 Health Literacy    Frequency of need for help with medical instructions: Never    No family history on file.   Review of Systems  Constitutional: Negative.  Negative for chills and fever.  HENT: Negative.  Negative for congestion and sore throat.   Respiratory: Negative.  Negative for  cough and shortness of breath.   Cardiovascular: Negative.  Negative for chest pain and palpitations.  Gastrointestinal:  Negative for abdominal pain, diarrhea, nausea and vomiting.  Genitourinary: Negative.  Negative for dysuria and hematuria.  Skin: Negative.  Negative for rash.  Neurological: Negative.  Negative for dizziness and headaches.  All other systems reviewed and are negative.   There were no vitals filed for this visit.  Physical Exam Vitals reviewed.  Constitutional:      Appearance: Normal appearance.  HENT:     Head: Normocephalic.     Mouth/Throat:     Mouth: Mucous membranes are moist.     Pharynx: Oropharynx is clear.  Eyes:     Extraocular Movements: Extraocular movements intact.     Pupils: Pupils are equal, round, and reactive to light.  Cardiovascular:     Rate and Rhythm: Normal rate and regular rhythm.     Pulses: Normal pulses.     Heart sounds: Normal heart sounds.  Pulmonary:     Effort: Pulmonary effort is normal.     Breath sounds: Normal breath sounds.  Abdominal:     Palpations: Abdomen is soft.     Tenderness: There is no abdominal tenderness.  Musculoskeletal:     Cervical back: No tenderness.  Lymphadenopathy:     Cervical: No cervical adenopathy.  Skin:    General: Skin is warm and dry.     Capillary Refill: Capillary refill takes less than 2 seconds.  Neurological:     General: No focal deficit present.     Mental Status: She is alert and oriented to person, place, and time.  Psychiatric:        Mood and Affect: Mood normal.        Behavior: Behavior normal.  ASSESSMENT & PLAN: A total of 40 minutes was spent with the patient and counseling/coordination of care regarding preparing for this visit, review of most recent office visit notes, review of multiple chronic medical conditions and their management, review of all medications, review of most recent bloodwork results, review of health maintenance items, education on  nutrition, prognosis, documentation, and need for follow up.  Problem List Items Addressed This Visit       Cardiovascular and Mediastinum   Essential hypertension - Primary   BP Readings from Last 3 Encounters:  02/12/24 (!) 140/60  02/07/24 (!) 146/57  01/11/24 (!) 160/80  Elevated blood pressure readings in the office but normal at home Recent metabolic panel shows hyponatremia Most likely related to diuretic use.  Recommend to DC HCTZ Recommend to continue amlodipine  5 mg daily and change valsartan  HCTZ to just valsartan  320 mg daily Advised to monitor blood pressure readings at home daily for the next couple of weeks and contact the office if numbers persistently abnormal       Relevant Medications   valsartan  (DIOVAN ) 320 MG tablet     Respiratory   OSA on CPAP   Stable on CPAP treatment        Relevant Orders   Ambulatory referral to Sleep Studies     Genitourinary   Stage 3b chronic kidney disease (HCC)   Advised to stay well-hydrated and avoid NSAIDs         Other   Severe obesity (BMI >= 40) (HCC) (Chronic)   Diet and nutrition discussed. Advised to decrease amount of daily carbohydrate intake and daily calories and increase amount of plant-based protein in her diet      Smoldering multiple myeloma   Stable without complications. No recent infections. Follows up with oncologist on a regular basis Last office visit notes reviewed      Multiple myeloma not having achieved remission (HCC)   Clinically stable Sees oncologist/hematologist on a regular basis      Hyponatremia   Last sodium 129 Most likely related to diuretic use Recommend to stop hydrochlorothiazide       Other Visit Diagnoses       Body mass index 40.0-44.9, adult [facility])          Patient Instructions  Continue amlodipine  5 mg daily Stop valsartan  HCT Start plain valsartan  320 mg daily Monitor blood pressure readings at home daily for the next couple of weeks and contact the  office if numbers persistently abnormal  Hypertension, Adult High blood pressure (hypertension) is when the force of blood pumping through the arteries is too strong. The arteries are the blood vessels that carry blood from the heart throughout the body. Hypertension forces the heart to work harder to pump blood and may cause arteries to become narrow or stiff. Untreated or uncontrolled hypertension can lead to a heart attack, heart failure, a stroke, kidney disease, and other problems. A blood pressure reading consists of a higher number over a lower number. Ideally, your blood pressure should be below 120/80. The first (top) number is called the systolic pressure. It is a measure of the pressure in your arteries as your heart beats. The second (bottom) number is called the diastolic pressure. It is a measure of the pressure in your arteries as the heart relaxes. What are the causes? The exact cause of this condition is not known. There are some conditions that result in high blood pressure. What increases the risk? Certain factors may make you more  likely to develop high blood pressure. Some of these risk factors are under your control, including: Smoking. Not getting enough exercise or physical activity. Being overweight. Having too much fat, sugar, calories, or salt (sodium) in your diet. Drinking too much alcohol . Other risk factors include: Having a personal history of heart disease, diabetes, high cholesterol, or kidney disease. Stress. Having a family history of high blood pressure and high cholesterol. Having obstructive sleep apnea. Age. The risk increases with age. What are the signs or symptoms? High blood pressure may not cause symptoms. Very high blood pressure (hypertensive crisis) may cause: Headache. Fast or irregular heartbeats (palpitations). Shortness of breath. Nosebleed. Nausea and vomiting. Vision changes. Severe chest pain, dizziness, and seizures. How is this  diagnosed? This condition is diagnosed by measuring your blood pressure while you are seated, with your arm resting on a flat surface, your legs uncrossed, and your feet flat on the floor. The cuff of the blood pressure monitor will be placed directly against the skin of your upper arm at the level of your heart. Blood pressure should be measured at least twice using the same arm. Certain conditions can cause a difference in blood pressure between your right and left arms. If you have a high blood pressure reading during one visit or you have normal blood pressure with other risk factors, you may be asked to: Return on a different day to have your blood pressure checked again. Monitor your blood pressure at home for 1 week or longer. If you are diagnosed with hypertension, you may have other blood or imaging tests to help your health care provider understand your overall risk for other conditions. How is this treated? This condition is treated by making healthy lifestyle changes, such as eating healthy foods, exercising more, and reducing your alcohol  intake. You may be referred for counseling on a healthy diet and physical activity. Your health care provider may prescribe medicine if lifestyle changes are not enough to get your blood pressure under control and if: Your systolic blood pressure is above 130. Your diastolic blood pressure is above 80. Your personal target blood pressure may vary depending on your medical conditions, your age, and other factors. Follow these instructions at home: Eating and drinking  Eat a diet that is high in fiber and potassium, and low in sodium, added sugar, and fat. An example of this eating plan is called the DASH diet. DASH stands for Dietary Approaches to Stop Hypertension. To eat this way: Eat plenty of fresh fruits and vegetables. Try to fill one half of your plate at each meal with fruits and vegetables. Eat whole grains, such as whole-wheat pasta, brown  rice, or whole-grain bread. Fill about one fourth of your plate with whole grains. Eat or drink low-fat dairy products, such as skim milk or low-fat yogurt. Avoid fatty cuts of meat, processed or cured meats, and poultry with skin. Fill about one fourth of your plate with lean proteins, such as fish, chicken without skin, beans, eggs, or tofu. Avoid pre-made and processed foods. These tend to be higher in sodium, added sugar, and fat. Reduce your daily sodium intake. Many people with hypertension should eat less than 1,500 mg of sodium a day. Do not drink alcohol  if: Your health care provider tells you not to drink. You are pregnant, may be pregnant, or are planning to become pregnant. If you drink alcohol : Limit how much you have to: 0-1 drink a day for women. 0-2 drinks a day for  men. Know how much alcohol  is in your drink. In the [place] one drink equals one 12 oz bottle of beer (355 mL), one 5 oz glass of wine (148 mL), or one 1 oz glass of hard liquor (44 mL). Lifestyle  Work with your health care provider to maintain a healthy body weight or to lose weight. Ask what an ideal weight is for you. Get at least 30 minutes of exercise that causes your heart to beat faster (aerobic exercise) most days of the week. Activities may include walking, swimming, or biking. Include exercise to strengthen your muscles (resistance exercise), such as Pilates or lifting weights, as part of your weekly exercise routine. Try to do these types of exercises for 30 minutes at least 3 days a week. Do not use any products that contain nicotine or tobacco. These products include cigarettes, chewing tobacco, and vaping devices, such as e-cigarettes. If you need help quitting, ask your health care provider. Monitor your blood pressure at home as told by your health care provider. Keep all follow-up visits. This is important. Medicines Take over-the-counter and prescription medicines only as told by your health care  provider. Follow directions carefully. Blood pressure medicines must be taken as prescribed. Do not skip doses of blood pressure medicine. Doing this puts you at risk for problems and can make the medicine less effective. Ask your health care provider about side effects or reactions to medicines that you should watch for. Contact a health care provider if you: Think you are having a reaction to a medicine you are taking. Have headaches that keep coming back (recurring). Feel dizzy. Have swelling in your ankles. Have trouble with your vision. Get help right away if you: Develop a severe headache or confusion. Have unusual weakness or numbness. Feel faint. Have severe pain in your chest or abdomen. Vomit repeatedly. Have trouble breathing. These symptoms may be an emergency. Get help right away. Call 911. Do not wait to see if the symptoms will go away. Do not drive yourself to the hospital. Summary Hypertension is when the force of blood pumping through your arteries is too strong. If this condition is not controlled, it may put you at risk for serious complications. Your personal target blood pressure may vary depending on your medical conditions, your age, and other factors. For most people, a normal blood pressure is less than 120/80. Hypertension is treated with lifestyle changes, medicines, or a combination of both. Lifestyle changes include losing weight, eating a healthy, low-sodium diet, exercising more, and limiting alcohol . This information is not intended to replace advice given to you by your health care provider. Make sure you discuss any questions you have with your health care provider. Document Revised: 11/17/2020 Document Reviewed: 11/17/2020 Elsevier Patient Education  2024 Elsevier Inc.         Emil Schaumann, MD [facility]    [1]  Allergies Allergen Reactions   Levaquin [Levofloxacin] Swelling   Morphine  And Codeine Nausea Only    Other Nausea And Vomiting    Darvocet-vomiting   Oxycodone Nausea Only   Tramadol  Other (See Comments)    And dizziness   Naprosyn [Naproxen] Itching and Rash

## 2024-02-12 NOTE — Assessment & Plan Note (Signed)
 BP Readings from Last 3 Encounters:  02/12/24 (!) 140/60  02/07/24 (!) 146/57  01/11/24 (!) 160/80  Elevated blood pressure readings in the office but normal at home Recent metabolic panel shows hyponatremia Most likely related to diuretic use.  Recommend to DC HCTZ Recommend to continue amlodipine  5 mg daily and change valsartan  HCTZ to just valsartan  320 mg daily Advised to monitor blood pressure readings at home daily for the next couple of weeks and contact the office if numbers persistently abnormal

## 2024-02-12 NOTE — Assessment & Plan Note (Signed)
Stable without complications. No recent infections. Follows up with oncologist on a regular basis Last office visit notes reviewed

## 2024-02-12 NOTE — Assessment & Plan Note (Signed)
 Clinically stable Sees oncologist/hematologist on a regular basis

## 2024-02-12 NOTE — Assessment & Plan Note (Signed)
Advised to stay well-hydrated and avoid NSAIDs. ?

## 2024-02-14 ENCOUNTER — Ambulatory Visit (HOSPITAL_COMMUNITY)
Admission: RE | Admit: 2024-02-14 | Discharge: 2024-02-14 | Disposition: A | Discharge status: Home / Self Care | Payer: Medicare (Managed Care) | Source: Ambulatory Visit | Attending: Physician Assistant | Admitting: Physician Assistant

## 2024-02-14 DIAGNOSIS — D472 Monoclonal gammopathy: Secondary | ICD-10-CM | POA: Diagnosis present

## 2024-02-16 ENCOUNTER — Ambulatory Visit: Payer: Medicare (Managed Care) | Admitting: Physician Assistant

## 2024-02-16 ENCOUNTER — Ambulatory Visit (HOSPITAL_COMMUNITY)
Admission: RE | Admit: 2024-02-16 | Discharge: 2024-02-16 | Disposition: A | Payer: Medicare (Managed Care) | Source: Ambulatory Visit | Attending: Vascular Surgery

## 2024-02-16 VITALS — BP 146/75 | HR 77 | Temp 97.9°F | Ht 61.0 in | Wt 215.0 lb

## 2024-02-16 DIAGNOSIS — I6523 Occlusion and stenosis of bilateral carotid arteries: Secondary | ICD-10-CM | POA: Diagnosis present

## 2024-02-16 DIAGNOSIS — I872 Venous insufficiency (chronic) (peripheral): Secondary | ICD-10-CM | POA: Insufficient documentation

## 2024-02-16 NOTE — Progress Notes (Signed)
 " Office Note     CC:  follow up Requesting Provider:  Purcell Emil Schanz, MD  HPI: Diane Daugherty is a 84 y.o. (Dec 07, 1940) female who presents for surveillance follow up of carotid artery stenosis. She initially was seen by Dr. Pearline in 2024 for evaluation with concerns of vasculitis. She has no history of stroke or TIA.   Today she is here with her daughter. She does not have any visual changes, slurred speech, facial drooping, unilateral upper or lower extremity weakness or numbness. She reports some swelling in her legs and pain in her left leg in area of large varicose veins. She says this area has been bothering her for about a year. She does elevate occasionally and wears knee high compression stockings. She is not a smoker.   Past Medical History:  Diagnosis Date   Anemia 09/24/1968   after childbirth   Arthritis    feels like all over   Carpal tunnel syndrome    Carpal tunnel syndrome    left   Cataract    immature on right   Chronic bronchitis (HCC)    get it most years (10/08/2014)   Chronic kidney disease    stage III, per Pt   Family history of anesthesia complication    daughter gets sick   History of blood transfusion    w/one of my back OR's   Hypertension    takes Hyzaar daily   Joint pain    all over   Joint swelling    Muscle spasms of lower extremity    takes Baclofen  daily prn   Nocturia    OSA on CPAP    Peripheral vascular disease    PFO (patent foramen ovale)    positive saline bubble study 05/12/09 ([facility])   Pneumonia 2010/ 2011   PONV (postoperative nausea and vomiting)    woke up during hysterectomy   Shortness of breath    with exertion;Albuterol  prn   Urinary frequency     Past Surgical History:  Procedure Laterality Date   ABDOMINAL HYSTERECTOMY  1970's?   APPENDECTOMY     BACK SURGERY     BREAST BIOPSY Right 1990's   CESAREAN SECTION  1962; 1963; 1970   CHEST TUBE INSERTION  80's   d/t pneumothorax   COLONOSCOPY      FRACTURE SURGERY     HARDWARE REMOVAL Right 1990's   took screw out of my lower leg ~ 1 yr after putting it in   HEMILAMINOTOMY LUMBAR SPINE  02/2002   L5; decompression of the L5 and S1 nerve root; synovial cyst excision/notes 06/09/2010   KNEE ARTHROSCOPY Right ?2013   LUMBAR LAMINECTOMY/DECOMPRESSION MICRODISCECTOMY  02/2010   POSTERIOR LUMBAR FUSION  05/2003   L4-5 and S-1    TIBIA FRACTURE SURGERY Right 1990's   put screw in   TOTAL KNEE ARTHROPLASTY Right 08/28/2012   Procedure: TOTAL KNEE ARTHROPLASTY;  Surgeon: Maude LELON Right, MD;  Location: [facility] OR;  Service: Orthopedics;  Laterality: Right;   TOTAL KNEE ARTHROPLASTY Left 10/07/2014   TOTAL KNEE ARTHROPLASTY Left 10/07/2014   Procedure: TOTAL KNEE ARTHROPLASTY;  Surgeon: Maude LELON Right, MD;  Location: [facility] OR;  Service: Orthopedics;  Laterality: Left;   TOTAL KNEE REVISION Left 10/08/2021   Procedure: LEFT TOTAL KNEE REVISION;  Surgeon: Vernetta Lonni GRADE, MD;  Location: [facility] ORS;  Service: Orthopedics;  Laterality: Left;    Social History   Socioeconomic History   Marital status: Divorced    Spouse  name: Not on file   Number of children: Not on file   Years of education: Not on file   Highest education level: 12th grade  Occupational History   Not on file  Tobacco Use   Smoking status: Never    Passive exposure: Never   Smokeless tobacco: Never  Vaping Use   Vaping status: Never Used  Substance and Sexual Activity   Alcohol  use: No   Drug use: No   Sexual activity: Not Currently  Other Topics Concern   Not on file  Social History Narrative   Are you right handed or left handed? Right   Are you currently employed ?    What is your current occupation? Retired/disability   Do you live at home alone?   Who lives with you? daughter   What type of home do you live in: 1 story or 2 story? two   Caffeine I in am    Social Drivers of Health   Tobacco Use: Low Risk (02/16/2024)   Patient History    Smoking  Tobacco Use: Never    Smokeless Tobacco Use: Never    Passive Exposure: Never  Financial Resource Strain: Low Risk (01/07/2024)   Overall Financial Resource Strain (CARDIA)    Difficulty of Paying Living Expenses: Not hard at all  Food Insecurity: No Food Insecurity (01/07/2024)   Epic    Worried About Programme Researcher, Broadcasting/film/video in the Last Year: Never true    Ran Out of Food in the Last Year: Never true  Transportation Needs: No Transportation Needs (01/07/2024)   Epic    Lack of Transportation (Medical): No    Lack of Transportation (Non-Medical): No  Physical Activity: Insufficiently Active (01/07/2024)   Exercise Vital Sign    Days of Exercise per Week: 2 days    Minutes of Exercise per Session: 10 min  Stress: Stress Concern Present (01/07/2024)   [employer] of Occupational Health - Occupational Stress Questionnaire    Feeling of Stress: Rather much  Social Connections: Moderately Integrated (01/07/2024)   Social Connection and Isolation Panel    Frequency of Communication with Friends and Family: More than three times a week    Frequency of Social Gatherings with Friends and Family: Twice a week    Attends Religious Services: 1 to 4 times per year    Active Member of [employer] or Organizations: Yes    Attends Banker Meetings: 1 to 4 times per year    Marital Status: Divorced  Catering Manager Violence: Not At Risk (04/17/2023)   Humiliation, Afraid, Rape, and Kick questionnaire    Fear of Current or Ex-Partner: No    Emotionally Abused: No    Physically Abused: No    Sexually Abused: No  Depression (PHQ2-9): Low Risk (07/12/2023)   Depression (PHQ2-9)    PHQ-2 Score: 0  Alcohol  Screen: Low Risk (04/17/2023)   Alcohol  Screen    Last Alcohol  Screening Score (AUDIT): 0  Housing: Low Risk (01/07/2024)   Epic    Unable to Pay for Housing in the Last Year: No    Number of Times Moved in the Last Year: 0    Homeless in the Last Year: No  Utilities: Not At Risk  (04/17/2023)   [employer] Utilities    Threatened with loss of utilities: No  Health Literacy: Adequate Health Literacy (04/17/2023)   B1300 Health Literacy    Frequency of need for help with medical instructions: Never    History reviewed. No  pertinent family history.  Current Outpatient Medications  Medication Sig Dispense Refill   acetaminophen  (TYLENOL ) 500 MG tablet Take 500 mg by mouth every 6 (six) hours as needed.     amLODipine  (NORVASC ) 5 MG tablet TAKE 1 TABLET (5 MG TOTAL) BY MOUTH DAILY. 90 tablet 3   baclofen  (LIORESAL ) 10 MG tablet Take 1 tablet (10 mg total) by mouth 3 (three) times daily as needed for muscle spasms. 20 tablet 0   Calcium Citrate-Vitamin D (CALCIUM CITRATE + D PO) Take 1 tablet by mouth daily.     CHELATED IRON PO Take 27 mg by mouth daily.     fluticasone  (FLONASE ) 50 MCG/ACT nasal spray Place 2 sprays into both nostrils daily as needed for allergies or rhinitis. 16 g 1   Krill Oil 300 MG CAPS Take 300 mg by mouth daily.     Multiple Vitamins-Minerals (CENTRUM SILVER 50+WOMEN PO) Take 1 tablet by mouth daily.     valsartan  (DIOVAN ) 320 MG tablet Take 1 tablet (320 mg total) by mouth daily. 90 tablet 3   No current facility-administered medications for this visit.    Allergies[1]   REVIEW OF SYSTEMS:   [X]  denotes positive finding, [ ]  denotes negative finding Cardiac  Comments:  Chest pain or chest pressure:    Shortness of breath upon exertion:    Short of breath when lying flat:    Irregular heart rhythm:        Vascular    Pain in calf, thigh, or hip brought on by ambulation:    Pain in feet at night that wakes you up from your sleep:     Blood clot in your veins:    Leg swelling:         Pulmonary    Oxygen at home:    Productive cough:     Wheezing:         Neurologic    Sudden weakness in arms or legs:     Sudden numbness in arms or legs:     Sudden onset of difficulty speaking or slurred speech:    Temporary loss of vision in one  eye:     Problems with dizziness:         Gastrointestinal    Blood in stool:     Vomited blood:         Genitourinary    Burning when urinating:     Blood in urine:        Psychiatric    Major depression:         Hematologic    Bleeding problems:    Problems with blood clotting too easily:        Skin    Rashes or ulcers:        Constitutional    Fever or chills:      PHYSICAL EXAMINATION:  Vitals:   02/16/24 1250 02/16/24 1253  BP: (!) 144/82 (!) 146/75  Pulse: 77   Temp: 97.9 F (36.6 C)   SpO2: 97%   Weight: 215 lb (97.5 kg)   Height: 5' 1 (1.549 m)     General:  WDWN in NAD; vital signs documented above Gait: Normal  HENT: WNL, normocephalic Pulmonary: normal non-labored breathing Cardiac: regular HR, without  Murmurs without carotid bruit Abdomen: soft Vascular Exam/Pulses: 2+ radial, 2+ Dp pulses bilaterally Extremities: without ischemic changes, without Gangrene , without cellulitis; without open wounds; cluster of varicosed veins in the medial proximal left lower leg Musculoskeletal: no  muscle wasting or atrophy  Neurologic: A&O X 3 Psychiatric:  The pt has Normal affect.   Non-Invasive Vascular Imaging:   VAS US  Carotid Duplex: Summary:  Right Carotid: There is no evidence of stenosis in the right ICA. The ECA appears >50% stenosed.   Left Carotid: Velocities in the left ICA are consistent with a 1-39% stenosis.   Vertebrals: Bilateral vertebral arteries demonstrate antegrade flow.  Subclavians: Left subclavian artery was stenotic. Normal flow hemodynamics were seen in the right subclavian artery.    ASSESSMENT/PLAN:: 84 y.o. female here for surveillance follow up of carotid artery stenosis. She initially was seen by Dr. Pearline in 2024 for evaluation with concerns of vasculitis. She has no history of stroke or TIA. She remains without any associated TIA or stroke like symptoms. Duplex today shows 1-39% ICA stenosis bilaterally. Normal flow in  the vertebral arteries. She does have some elevated velocities in the left subclavian but she is not symptomatic from this. She reports some varicosed veins in her LLE that are uncomfortable and would like these evaluated.  - Discussed Aspirin and Statin use with patient and her daughter. Recommend they discuss further with her PCP - I will have her return in 1 month or so for a venous reflux study of her LLE - She does not need a repeat carotid duplex for 2 years  Marquavis Hannen, PA-C [facility] [phone]  Clinic MD:   Pearline     [1]  Allergies Allergen Reactions   Levaquin [Levofloxacin] Swelling   Morphine  And Codeine Nausea Only   Other Nausea And Vomiting    Darvocet-vomiting   Oxycodone Nausea Only   Tramadol  Other (See Comments)    And dizziness   Naprosyn [Naproxen] Itching and Rash   "

## 2024-02-19 ENCOUNTER — Ambulatory Visit: Payer: Self-pay | Admitting: Physician Assistant

## 2024-02-21 ENCOUNTER — Other Ambulatory Visit: Payer: Self-pay | Admitting: Vascular Surgery

## 2024-02-21 DIAGNOSIS — I872 Venous insufficiency (chronic) (peripheral): Secondary | ICD-10-CM

## 2024-03-19 ENCOUNTER — Institutional Professional Consult (permissible substitution): Payer: Self-pay | Admitting: Neurology

## 2024-03-22 ENCOUNTER — Ambulatory Visit: Payer: Medicare (Managed Care)

## 2024-03-22 ENCOUNTER — Ambulatory Visit (HOSPITAL_COMMUNITY): Payer: Medicare (Managed Care)

## 2024-03-29 ENCOUNTER — Ambulatory Visit

## 2024-04-18 ENCOUNTER — Ambulatory Visit

## 2024-07-31 ENCOUNTER — Inpatient Hospital Stay

## 2024-08-07 ENCOUNTER — Inpatient Hospital Stay: Admitting: Hematology and Oncology
